# Patient Record
Sex: Female | Born: 1950 | Race: White | Hispanic: No | Marital: Married | Smoking: Former smoker
Health system: Southern US, Community
[De-identification: ages and names within clinical notes are randomized; demographics above are authoritative.]

## PROBLEM LIST (undated history)

## (undated) DIAGNOSIS — M1990 Unspecified osteoarthritis, unspecified site: Secondary | ICD-10-CM

## (undated) DIAGNOSIS — I714 Abdominal aortic aneurysm, without rupture, unspecified: Secondary | ICD-10-CM

## (undated) DIAGNOSIS — J449 Chronic obstructive pulmonary disease, unspecified: Secondary | ICD-10-CM

## (undated) DIAGNOSIS — Z720 Tobacco use: Secondary | ICD-10-CM

## (undated) DIAGNOSIS — E039 Hypothyroidism, unspecified: Secondary | ICD-10-CM

## (undated) DIAGNOSIS — E785 Hyperlipidemia, unspecified: Secondary | ICD-10-CM

## (undated) DIAGNOSIS — J439 Emphysema, unspecified: Secondary | ICD-10-CM

## (undated) DIAGNOSIS — N946 Dysmenorrhea, unspecified: Secondary | ICD-10-CM

## (undated) DIAGNOSIS — E079 Disorder of thyroid, unspecified: Secondary | ICD-10-CM

## (undated) DIAGNOSIS — Z5189 Encounter for other specified aftercare: Secondary | ICD-10-CM

## (undated) DIAGNOSIS — R42 Dizziness and giddiness: Secondary | ICD-10-CM

## (undated) DIAGNOSIS — K219 Gastro-esophageal reflux disease without esophagitis: Secondary | ICD-10-CM

## (undated) DIAGNOSIS — R0600 Dyspnea, unspecified: Secondary | ICD-10-CM

## (undated) HISTORY — PX: JOINT REPLACEMENT: SHX530

## (undated) HISTORY — DX: Hyperlipidemia, unspecified: E78.5

## (undated) HISTORY — PX: TUBAL LIGATION: SHX77

## (undated) HISTORY — DX: Abdominal aortic aneurysm, without rupture, unspecified: I71.40

## (undated) HISTORY — DX: Emphysema, unspecified: J43.9

## (undated) HISTORY — DX: Dizziness and giddiness: R42

## (undated) HISTORY — DX: Tobacco use: Z72.0

## (undated) HISTORY — DX: Chronic obstructive pulmonary disease, unspecified: J44.9

## (undated) HISTORY — DX: Disorder of thyroid, unspecified: E07.9

## (undated) HISTORY — DX: Dysmenorrhea, unspecified: N94.6

## (undated) HISTORY — DX: Encounter for other specified aftercare: Z51.89

## (undated) HISTORY — DX: Abdominal aortic aneurysm, without rupture: I71.4

---

## 2000-10-30 ENCOUNTER — Other Ambulatory Visit: Admission: RE | Admit: 2000-10-30 | Discharge: 2000-10-30 | Payer: Self-pay | Admitting: Family Medicine

## 2002-02-11 ENCOUNTER — Other Ambulatory Visit: Admission: RE | Admit: 2002-02-11 | Discharge: 2002-02-11 | Payer: Self-pay | Admitting: Family Medicine

## 2002-02-26 ENCOUNTER — Encounter: Admission: RE | Admit: 2002-02-26 | Discharge: 2002-02-26 | Payer: Self-pay | Admitting: Family Medicine

## 2002-02-26 ENCOUNTER — Encounter: Payer: Self-pay | Admitting: Family Medicine

## 2002-03-01 ENCOUNTER — Encounter: Payer: Self-pay | Admitting: Family Medicine

## 2002-03-01 ENCOUNTER — Ambulatory Visit (HOSPITAL_COMMUNITY): Admission: RE | Admit: 2002-03-01 | Discharge: 2002-03-01 | Payer: Self-pay | Admitting: Family Medicine

## 2002-03-16 ENCOUNTER — Ambulatory Visit (HOSPITAL_COMMUNITY): Admission: RE | Admit: 2002-03-16 | Discharge: 2002-03-16 | Payer: Self-pay | Admitting: Family Medicine

## 2002-03-16 ENCOUNTER — Encounter: Payer: Self-pay | Admitting: Family Medicine

## 2004-06-11 ENCOUNTER — Other Ambulatory Visit: Admission: RE | Admit: 2004-06-11 | Discharge: 2004-06-11 | Payer: Self-pay | Admitting: Family Medicine

## 2005-05-21 ENCOUNTER — Ambulatory Visit: Payer: Self-pay | Admitting: Family Medicine

## 2005-07-08 ENCOUNTER — Ambulatory Visit: Payer: Self-pay | Admitting: Family Medicine

## 2005-07-15 ENCOUNTER — Ambulatory Visit: Payer: Self-pay | Admitting: Family Medicine

## 2005-07-15 ENCOUNTER — Encounter: Payer: Self-pay | Admitting: Family Medicine

## 2005-07-15 ENCOUNTER — Other Ambulatory Visit: Admission: RE | Admit: 2005-07-15 | Discharge: 2005-07-15 | Payer: Self-pay | Admitting: Family Medicine

## 2005-07-24 ENCOUNTER — Ambulatory Visit: Payer: Self-pay | Admitting: Gastroenterology

## 2005-08-05 ENCOUNTER — Ambulatory Visit: Payer: Self-pay | Admitting: Family Medicine

## 2005-08-07 ENCOUNTER — Ambulatory Visit: Payer: Self-pay | Admitting: Gastroenterology

## 2005-08-07 ENCOUNTER — Encounter (INDEPENDENT_AMBULATORY_CARE_PROVIDER_SITE_OTHER): Payer: Self-pay | Admitting: Specialist

## 2005-09-20 ENCOUNTER — Ambulatory Visit: Payer: Self-pay | Admitting: Family Medicine

## 2005-12-05 ENCOUNTER — Ambulatory Visit: Payer: Self-pay | Admitting: Internal Medicine

## 2006-05-20 ENCOUNTER — Ambulatory Visit: Payer: Self-pay | Admitting: Family Medicine

## 2006-05-20 ENCOUNTER — Encounter: Admission: RE | Admit: 2006-05-20 | Discharge: 2006-05-20 | Payer: Self-pay | Admitting: Family Medicine

## 2006-07-15 ENCOUNTER — Ambulatory Visit: Payer: Self-pay | Admitting: Family Medicine

## 2006-07-15 LAB — CONVERTED CEMR LAB
ALT: 16 units/L (ref 0–40)
AST: 13 units/L (ref 0–37)
Albumin: 3.9 g/dL (ref 3.5–5.2)
Alkaline Phosphatase: 68 units/L (ref 39–117)
BUN: 13 mg/dL (ref 6–23)
Basophils Absolute: 0 10*3/uL (ref 0.0–0.1)
Basophils Relative: 0.6 % (ref 0.0–1.0)
CO2: 31 meq/L (ref 19–32)
Calcium: 9.5 mg/dL (ref 8.4–10.5)
Chloride: 108 meq/L (ref 96–112)
Chol/HDL Ratio, serum: 4.2
Cholesterol: 185 mg/dL (ref 0–200)
Creatinine, Ser: 0.8 mg/dL (ref 0.4–1.2)
Eosinophil percent: 3.6 % (ref 0.0–5.0)
GFR calc non Af Amer: 79 mL/min
Glomerular Filtration Rate, Af Am: 95 mL/min/{1.73_m2}
Glucose, Bld: 87 mg/dL (ref 70–99)
HCT: 46.6 % — ABNORMAL HIGH (ref 36.0–46.0)
HDL: 43.6 mg/dL (ref >39.0)
Hemoglobin: 15.7 g/dL — ABNORMAL HIGH (ref 12.0–15.0)
LDL Cholesterol: 118 mg/dL — ABNORMAL HIGH (ref 0–99)
Lymphocytes Relative: 35.4 % (ref 12.0–46.0)
MCHC: 33.8 g/dL (ref 30.0–36.0)
MCV: 95.2 fL (ref 78.0–100.0)
Monocytes Absolute: 0.4 10*3/uL (ref 0.2–0.7)
Monocytes Relative: 5.3 % (ref 3.0–11.0)
Neutro Abs: 4.2 10*3/uL (ref 1.4–7.7)
Neutrophils Relative %: 55.1 % (ref 43.0–77.0)
Platelets: 241 10*3/uL (ref 150–400)
Potassium: 4.3 meq/L (ref 3.5–5.1)
RBC: 4.89 M/uL (ref 3.87–5.11)
RDW: 12.7 % (ref 11.5–14.6)
Sodium: 145 meq/L (ref 135–145)
TSH: 1.05 microintl units/mL (ref 0.35–5.50)
Total Bilirubin: 0.7 mg/dL (ref 0.3–1.2)
Total Protein: 6.9 g/dL (ref 6.0–8.3)
Triglyceride fasting, serum: 118 mg/dL (ref 0–149)
VLDL: 24 mg/dL (ref 0–40)
WBC: 7.6 10*3/uL (ref 4.5–10.5)

## 2006-07-29 ENCOUNTER — Ambulatory Visit: Payer: Self-pay | Admitting: Family Medicine

## 2006-07-29 ENCOUNTER — Encounter: Payer: Self-pay | Admitting: Family Medicine

## 2006-07-29 ENCOUNTER — Other Ambulatory Visit: Admission: RE | Admit: 2006-07-29 | Discharge: 2006-07-29 | Payer: Self-pay | Admitting: Family Medicine

## 2007-03-16 ENCOUNTER — Ambulatory Visit: Payer: Self-pay | Admitting: Family Medicine

## 2007-04-01 DIAGNOSIS — N6019 Diffuse cystic mastopathy of unspecified breast: Secondary | ICD-10-CM

## 2007-04-29 ENCOUNTER — Encounter: Payer: Self-pay | Admitting: Family Medicine

## 2007-07-13 ENCOUNTER — Ambulatory Visit: Payer: Self-pay | Admitting: Family Medicine

## 2007-07-13 LAB — CONVERTED CEMR LAB
ALT: 16 units/L (ref 0–35)
AST: 17 units/L (ref 0–37)
Albumin: 4.1 g/dL (ref 3.5–5.2)
Alkaline Phosphatase: 78 units/L (ref 39–117)
BUN: 14 mg/dL (ref 6–23)
Basophils Absolute: 0 10*3/uL (ref 0.0–0.1)
Basophils Relative: 0.4 % (ref 0.0–1.0)
Bilirubin Urine: NEGATIVE
Bilirubin, Direct: 0.1 mg/dL (ref 0.0–0.3)
CO2: 27 meq/L (ref 19–32)
Calcium: 10 mg/dL (ref 8.4–10.5)
Chloride: 107 meq/L (ref 96–112)
Cholesterol: 223 mg/dL (ref 0–200)
Creatinine, Ser: 0.9 mg/dL (ref 0.4–1.2)
Direct LDL: 148.6 mg/dL
Eosinophils Absolute: 0.2 10*3/uL (ref 0.0–0.6)
Eosinophils Relative: 2.8 % (ref 0.0–5.0)
GFR calc Af Amer: 83 mL/min
GFR calc non Af Amer: 68 mL/min
Glucose, Bld: 96 mg/dL (ref 70–99)
Glucose, Urine, Semiquant: NEGATIVE
HCT: 47.8 % — ABNORMAL HIGH (ref 36.0–46.0)
HDL: 45 mg/dL (ref >39.0)
Hemoglobin: 16.9 g/dL — ABNORMAL HIGH (ref 12.0–15.0)
Ketones, urine, test strip: NEGATIVE
Lymphocytes Relative: 33.2 % (ref 12.0–46.0)
MCHC: 35.3 g/dL (ref 30.0–36.0)
MCV: 93.7 fL (ref 78.0–100.0)
Monocytes Absolute: 0.5 10*3/uL (ref 0.2–0.7)
Monocytes Relative: 6.3 % (ref 3.0–11.0)
Neutro Abs: 5 10*3/uL (ref 1.4–7.7)
Neutrophils Relative %: 57.3 % (ref 43.0–77.0)
Nitrite: POSITIVE
Platelets: 233 10*3/uL (ref 150–400)
Potassium: 5.8 meq/L — ABNORMAL HIGH (ref 3.5–5.1)
Protein, U semiquant: NEGATIVE
RBC: 5.11 M/uL (ref 3.87–5.11)
RDW: 12.6 % (ref 11.5–14.6)
Sodium: 144 meq/L (ref 135–145)
Specific Gravity, Urine: 1.025
TSH: 0.9 microintl units/mL (ref 0.35–5.50)
Total Bilirubin: 0.6 mg/dL (ref 0.3–1.2)
Total CHOL/HDL Ratio: 5
Total Protein: 7.1 g/dL (ref 6.0–8.3)
Triglycerides: 180 mg/dL — ABNORMAL HIGH (ref 0–149)
Urobilinogen, UA: NEGATIVE
VLDL: 36 mg/dL (ref 0–40)
WBC Urine, dipstick: NEGATIVE
WBC: 8.5 10*3/uL (ref 4.5–10.5)
pH: 5

## 2007-07-19 DIAGNOSIS — N61 Mastitis without abscess: Secondary | ICD-10-CM | POA: Insufficient documentation

## 2007-07-20 ENCOUNTER — Encounter: Payer: Self-pay | Admitting: Family Medicine

## 2007-07-20 ENCOUNTER — Other Ambulatory Visit: Admission: RE | Admit: 2007-07-20 | Discharge: 2007-07-20 | Payer: Self-pay | Admitting: Family Medicine

## 2007-07-20 ENCOUNTER — Ambulatory Visit: Payer: Self-pay | Admitting: Family Medicine

## 2007-07-20 DIAGNOSIS — N951 Menopausal and female climacteric states: Secondary | ICD-10-CM | POA: Insufficient documentation

## 2007-07-20 DIAGNOSIS — E018 Other iodine-deficiency related thyroid disorders and allied conditions: Secondary | ICD-10-CM

## 2007-07-20 DIAGNOSIS — H8110 Benign paroxysmal vertigo, unspecified ear: Secondary | ICD-10-CM | POA: Insufficient documentation

## 2007-07-20 DIAGNOSIS — Z8669 Personal history of other diseases of the nervous system and sense organs: Secondary | ICD-10-CM

## 2007-07-20 DIAGNOSIS — F17200 Nicotine dependence, unspecified, uncomplicated: Secondary | ICD-10-CM

## 2007-07-21 ENCOUNTER — Encounter: Payer: Self-pay | Admitting: Family Medicine

## 2007-11-26 ENCOUNTER — Ambulatory Visit: Payer: Self-pay | Admitting: Family Medicine

## 2007-11-26 DIAGNOSIS — M549 Dorsalgia, unspecified: Secondary | ICD-10-CM

## 2008-06-14 ENCOUNTER — Encounter: Payer: Self-pay | Admitting: Family Medicine

## 2008-06-30 ENCOUNTER — Telehealth: Payer: Self-pay | Admitting: Family Medicine

## 2008-07-11 ENCOUNTER — Telehealth: Payer: Self-pay | Admitting: Family Medicine

## 2008-07-13 ENCOUNTER — Ambulatory Visit: Payer: Self-pay | Admitting: Family Medicine

## 2008-07-13 LAB — CONVERTED CEMR LAB
AST: 16 units/L (ref 0–37)
Alkaline Phosphatase: 78 units/L (ref 39–117)
Bilirubin Urine: NEGATIVE
Bilirubin, Direct: 0.1 mg/dL (ref 0.0–0.3)
CO2: 29 meq/L (ref 19–32)
Chloride: 109 meq/L (ref 96–112)
GFR calc Af Amer: 94 mL/min
Glucose, Bld: 88 mg/dL (ref 70–99)
Glucose, Urine, Semiquant: NEGATIVE
HDL: 43.3 mg/dL (ref >39.0)
LDL Cholesterol: 101 mg/dL — ABNORMAL HIGH (ref 0–99)
Lymphocytes Relative: 39 % (ref 12.0–46.0)
Monocytes Absolute: 0.4 10*3/uL (ref 0.1–1.0)
Monocytes Relative: 5 % (ref 3.0–12.0)
Neutrophils Relative %: 51.7 % (ref 43.0–77.0)
Nitrite: POSITIVE
Platelets: 210 10*3/uL (ref 150–400)
Potassium: 4.8 meq/L (ref 3.5–5.1)
Protein, U semiquant: NEGATIVE
RDW: 12.5 % (ref 11.5–14.6)
Sodium: 144 meq/L (ref 135–145)
Specific Gravity, Urine: 1.025
Total CHOL/HDL Ratio: 4
Total Protein: 6.7 g/dL (ref 6.0–8.3)
Triglycerides: 144 mg/dL (ref 0–149)
Urobilinogen, UA: 0.2
WBC Urine, dipstick: NEGATIVE
WBC: 7.9 10*3/uL (ref 4.5–10.5)
pH: 5

## 2008-08-01 ENCOUNTER — Encounter: Payer: Self-pay | Admitting: Family Medicine

## 2008-08-01 ENCOUNTER — Ambulatory Visit: Payer: Self-pay | Admitting: Family Medicine

## 2008-08-01 ENCOUNTER — Other Ambulatory Visit: Admission: RE | Admit: 2008-08-01 | Discharge: 2008-08-01 | Payer: Self-pay | Admitting: Family Medicine

## 2008-08-01 DIAGNOSIS — J449 Chronic obstructive pulmonary disease, unspecified: Secondary | ICD-10-CM

## 2008-08-12 ENCOUNTER — Ambulatory Visit: Payer: Self-pay | Admitting: Internal Medicine

## 2008-08-15 ENCOUNTER — Telehealth: Payer: Self-pay | Admitting: Family Medicine

## 2008-09-12 ENCOUNTER — Ambulatory Visit: Payer: Self-pay | Admitting: Family Medicine

## 2008-10-24 ENCOUNTER — Telehealth: Payer: Self-pay | Admitting: Family Medicine

## 2008-12-29 ENCOUNTER — Telehealth: Payer: Self-pay | Admitting: Family Medicine

## 2009-03-23 ENCOUNTER — Ambulatory Visit: Payer: Self-pay | Admitting: Family Medicine

## 2009-07-25 ENCOUNTER — Telehealth: Payer: Self-pay | Admitting: Family Medicine

## 2009-07-28 ENCOUNTER — Ambulatory Visit: Payer: Self-pay | Admitting: Family Medicine

## 2009-07-28 LAB — CONVERTED CEMR LAB
ALT: 15 units/L (ref 0–35)
Alkaline Phosphatase: 65 units/L (ref 39–117)
Basophils Relative: 0.8 % (ref 0.0–3.0)
Bilirubin Urine: NEGATIVE
Bilirubin, Direct: 0.1 mg/dL (ref 0.0–0.3)
Calcium: 9.5 mg/dL (ref 8.4–10.5)
Chloride: 109 meq/L (ref 96–112)
Cholesterol: 203 mg/dL — ABNORMAL HIGH (ref 0–200)
Creatinine, Ser: 0.8 mg/dL (ref 0.4–1.2)
Eosinophils Relative: 5 % (ref 0.0–5.0)
Lymphocytes Relative: 38.6 % (ref 12.0–46.0)
Neutrophils Relative %: 50.5 % (ref 43.0–77.0)
Protein, U semiquant: NEGATIVE
RBC: 4.54 M/uL (ref 3.87–5.11)
Total CHOL/HDL Ratio: 4
Total Protein: 6.7 g/dL (ref 6.0–8.3)
Triglycerides: 206 mg/dL — ABNORMAL HIGH (ref 0.0–149.0)
Urobilinogen, UA: 0.2
VLDL: 41.2 mg/dL — ABNORMAL HIGH (ref 0.0–40.0)
WBC: 7.7 10*3/uL (ref 4.5–10.5)

## 2009-08-07 ENCOUNTER — Ambulatory Visit: Payer: Self-pay | Admitting: Family Medicine

## 2009-08-07 ENCOUNTER — Other Ambulatory Visit: Admission: RE | Admit: 2009-08-07 | Discharge: 2009-08-07 | Payer: Self-pay | Admitting: Family Medicine

## 2009-08-18 ENCOUNTER — Encounter: Payer: Self-pay | Admitting: *Deleted

## 2009-10-05 ENCOUNTER — Telehealth: Payer: Self-pay | Admitting: Family Medicine

## 2009-12-06 ENCOUNTER — Telehealth: Payer: Self-pay | Admitting: Family Medicine

## 2010-02-15 ENCOUNTER — Ambulatory Visit: Payer: Self-pay | Admitting: Family Medicine

## 2010-02-15 DIAGNOSIS — M25539 Pain in unspecified wrist: Secondary | ICD-10-CM | POA: Insufficient documentation

## 2010-05-10 ENCOUNTER — Telehealth: Payer: Self-pay | Admitting: Family Medicine

## 2010-05-11 ENCOUNTER — Telehealth: Payer: Self-pay | Admitting: Family Medicine

## 2010-07-04 ENCOUNTER — Telehealth: Payer: Self-pay | Admitting: Family Medicine

## 2010-07-17 ENCOUNTER — Ambulatory Visit: Payer: Self-pay | Admitting: Family Medicine

## 2010-07-17 ENCOUNTER — Telehealth: Payer: Self-pay | Admitting: Family Medicine

## 2010-07-17 DIAGNOSIS — M25519 Pain in unspecified shoulder: Secondary | ICD-10-CM | POA: Insufficient documentation

## 2010-08-10 ENCOUNTER — Ambulatory Visit: Payer: Self-pay | Admitting: Family Medicine

## 2010-08-10 LAB — CONVERTED CEMR LAB
AST: 18 units/L (ref 0–37)
Alkaline Phosphatase: 75 units/L (ref 39–117)
Basophils Absolute: 0.1 10*3/uL (ref 0.0–0.1)
Bilirubin, Direct: 0.1 mg/dL (ref 0.0–0.3)
Calcium: 9.3 mg/dL (ref 8.4–10.5)
Creatinine, Ser: 0.8 mg/dL (ref 0.4–1.2)
GFR calc non Af Amer: 79.58 mL/min (ref >60)
Glucose, Bld: 92 mg/dL (ref 70–99)
HDL: 46 mg/dL (ref >39.00)
Hemoglobin: 15.5 g/dL — ABNORMAL HIGH (ref 12.0–15.0)
Lymphocytes Relative: 45.5 % (ref 12.0–46.0)
Monocytes Relative: 6.1 % (ref 3.0–12.0)
Neutro Abs: 3 10*3/uL (ref 1.4–7.7)
Neutrophils Relative %: 42.9 % — ABNORMAL LOW (ref 43.0–77.0)
Nitrite: NEGATIVE
Platelets: 199 10*3/uL (ref 150.0–400.0)
Protein, U semiquant: NEGATIVE
RDW: 13.5 % (ref 11.5–14.6)
Sodium: 143 meq/L (ref 135–145)
Total Bilirubin: 0.9 mg/dL (ref 0.3–1.2)
VLDL: 29 mg/dL (ref 0.0–40.0)
WBC Urine, dipstick: NEGATIVE
pH: 5

## 2010-08-17 ENCOUNTER — Ambulatory Visit: Payer: Self-pay | Admitting: Family Medicine

## 2010-09-28 ENCOUNTER — Other Ambulatory Visit
Admission: RE | Admit: 2010-09-28 | Discharge: 2010-09-28 | Payer: Self-pay | Source: Home / Self Care | Admitting: Family Medicine

## 2010-09-28 ENCOUNTER — Other Ambulatory Visit: Payer: Self-pay | Admitting: Family Medicine

## 2010-09-28 ENCOUNTER — Encounter: Payer: Self-pay | Admitting: Family Medicine

## 2010-10-11 NOTE — Progress Notes (Signed)
Summary: hydrocodone refill  Phone Note From Pharmacy   Summary of Call: patient is requesting a refill of hydrocodone.  last office visit was 6/11 Initial call taken by: Kern Reap CMA Duncan Dull),  May 10, 2010 8:48 AM  Follow-up for Phone Call        please call she was given 60 of each and a refill back in June??????????? Follow-up by: Roderick Pee MD,  May 10, 2010 11:14 AM  Additional Follow-up for Phone Call Additional follow up Details #1::        note sent to pharmacy Additional Follow-up by: Kern Reap CMA Duncan Dull),  May 10, 2010 3:17 PM

## 2010-10-11 NOTE — Assessment & Plan Note (Signed)
Summary: CPX // RS----PT [facility] // RS   Vital Signs:  Patient profile:   60 year old female Menstrual status:  postmenopausal Height:      65 inches Weight:      146 pounds Temp:     98.0 degrees F oral BP sitting:   110 / 80  (left arm) Cuff size:   regular  Vitals Entered By: Kern Reap CMA Duncan Dull) (September 28, 2010 2:58 PM) CC: cpx Is Patient Diabetic? No Pain Assessment Patient in pain? no        CC:  cpx.  History of Present Illness: Valeria is a 60 year old, married female, smoker, who comes in today for physical examination  Her main problem has always been the tobacco abuse.  She did purchase the chantix, but has not taken the medication.  She gets routine eye care, dental care, BSE monthly, annual mammography, colonoscopy, normal tetanus booster 2003, seasonal flu 2011, Pneumovax 2009.  She also takes Synthroid 100 micrograms daily for hypothyroidism.  TSH is normal.  Will continue that dose.  She also takes Zocor 60 mg nightly for hyperlipidemia, along with aspirin tablet.  Allergies (verified): No Known Drug Allergies  Past History:  Past medical, surgical, family and social histories (including risk factors) reviewed, and no changes noted (except as noted below).  Past Medical History: Reviewed history from 08/01/2008 and no changes required. status post childbirth x 3 hyperlipidemia PMS tobacco abuse persistent vertigo Hypothyroidism COPD  Past Surgical History: Reviewed history from 04/01/2007 and no changes required. CB x 3 Colonoscopy-08/07/2005  Family History: Reviewed history from 04/01/2007 and no changes required. Family History Thyroid disease Family History Diabetes 1st degree relative Family History High cholesterol Family History Hypertension  Social History: Reviewed history from 08/01/2008 and no changes required. Current Smoker Alcohol use-no Retired Drug use-no  Review of Systems      See HPI  Physical Exam  General:   Well-developed,well-nourished,in no acute distress; alert,appropriate and cooperative throughout examination Head:  Normocephalic and atraumatic without obvious abnormalities. No apparent alopecia or balding. Eyes:  No corneal or conjunctival inflammation noted. EOMI. Perrla. Funduscopic exam benign, without hemorrhages, exudates or papilledema. Vision grossly normal. Ears:  External ear exam shows no significant lesions or deformities.  Otoscopic examination reveals clear canals, tympanic membranes are intact bilaterally without bulging, retraction, inflammation or discharge. Hearing is grossly normal bilaterally. Nose:  External nasal examination shows no deformity or inflammation. Nasal mucosa are pink and moist without lesions or exudates. Mouth:  Oral mucosa and oropharynx without lesions or exudates.  Teeth in good repair. Neck:  No deformities, masses, or tenderness noted. Chest Wall:  No deformities, masses, or tenderness noted. Breasts:  No mass, nodules, thickening, tenderness, bulging, retraction, inflamation, nipple discharge or skin changes noted.   Lungs:  symmetrical but decreased breath sounds consistent with some COPD Heart:  Normal rate and regular rhythm. S1 and S2 normal without gallop, murmur, click, rub or other extra sounds. Abdomen:  Bowel sounds positive,abdomen soft and non-tender without masses, organomegaly or hernias noted. Rectal:  No external abnormalities noted. Normal sphincter tone. No rectal masses or tenderness. Genitalia:  Pelvic Exam:        External: normal female genitalia without lesions or masses        Vagina: normal without lesions or masses        Cervix: normal without lesions or masses        Adnexa: normal bimanual exam without masses or fullness  Uterus: normal by palpation        Pap smear: performed Msk:  No deformity or scoliosis noted of thoracic or lumbar spine.   Pulses:  R and L carotid,radial,femoral,dorsalis pedis and posterior  tibial pulses are full and equal bilaterally Extremities:  No clubbing, cyanosis, edema, or deformity noted with normal full range of motion of all joints.   Neurologic:  No cranial nerve deficits noted. Station and gait are normal. Plantar reflexes are down-going bilaterally. DTRs are symmetrical throughout. Sensory, motor and coordinative functions appear intact. Skin:  Intact without suspicious lesions or rashes Cervical Nodes:  No lymphadenopathy noted Axillary Nodes:  No palpable lymphadenopathy Inguinal Nodes:  No significant adenopathy Psych:  Cognition and judgment appear intact. Alert and cooperative with normal attention span and concentration. No apparent delusions, illusions, hallucinations   Impression & Recommendations:  Problem # 1:  COPD (ICD-496) Assessment Unchanged  Orders: Prescription Created Electronically [phone]) EKG w/ Interpretation (93000) Spirometry w/Graph (94010)  Problem # 2:  HYPOTHYROIDISM (ICD-244.9) Assessment: Improved  Her updated medication list for this problem includes:    Synthroid 100 Mcg Tabs (Levothyroxine sodium) .Marland Kitchen... Take 1 tablet by mouth every morning  Orders: Prescription Created Electronically [phone])  Problem # 3:  TOBACCO ABUSE (ICD-305.1) Assessment: Unchanged  The following medications were removed from the medication list:    Chantix Starting Month Pak 0.5 Mg X 11 & 1 Mg X 42 Misc (Varenicline tartrate) ..... Uad Her updated medication list for this problem includes:    Chantix Continuing Month Pak 1 Mg Tabs (Varenicline tartrate) ..... Uad  Orders: Prescription Created Electronically [phone]) EKG w/ Interpretation (93000) Spirometry w/Graph (94010)  Problem # 4:  PHYSICAL EXAMINATION (ICD-V70.0) Assessment: Unchanged  Orders: Prescription Created Electronically [phone])  Complete Medication List: 1)  Synthroid 100 Mcg Tabs (Levothyroxine sodium) .... Take 1 tablet by mouth every morning 2)  Asa 325  3)  Vicodin  Es 7.5-750 Mg Tabs (Hydrocodone-acetaminophen) .... Take 1 tablet by mouth three times a day 4)  Flexeril 10 Mg Tabs (Cyclobenzaprine hcl) .... Take 1 tablet by mouth three times a day 5)  Chantix Continuing Month Pak 1 Mg Tabs (Varenicline tartrate) .... Uad 6)  Zocor 40 Mg Tabs (Simvastatin) .... Take one and half tab by mouth once daily  Patient Instructions: 1)  because of the way y   Lungs sound and your overall health and the abnormalities that we see on your breathing tests.  I, think it's time he you just have to stop smoking.  Start the chantix a half a tablet a day.  Begin tapering by two per week.  Follow-up in 4 weeks 2)  Schedule your mammogram. 3)  Take an Aspirin every day. Prescriptions: ZOCOR 40 MG TABS (SIMVASTATIN) take one and half tab by mouth once daily  #150 x 3   Entered and Authorized by:   Roderick Pee MD   Signed by:   Roderick Pee MD on 09/28/2010   Method used:   [facility]  [address] [phone]       [address]       [place], [place]  [address]       Ph: [phone]       Fax: [phone]   RxID:   [id number] SYNTHROID 100 MCG  TABS (LEVOTHYROXINE SODIUM) Take 1 tablet by mouth every morning  #100 Tablet x 3  Entered and Authorized by:   Roderick Pee MD   Signed by:   Roderick Pee MD on 09/28/2010   Method used:   [facility]        [facility]  [address] [phone]       [address]       [place], [place]  [address]       Ph: [phone]       Fax: [phone]   RxID:   [id number]    Orders Added: 1)  Prescription Created Electronically [G8553] 2)  Est. Patient 40-64 years [99396] 3)  EKG w/ Interpretation [93000] 4)  Spirometry w/Graph [64403]

## 2010-10-11 NOTE — Progress Notes (Signed)
Summary: refill  Phone Note Refill Request Message from:  Fax from Pharmacy on December 06, 2009 2:27 PM  Refills Requested: Medication #1:  VALIUM 5 MG TABS take one tab at bedtime Initial call taken by: Kern Reap CMA Duncan Dull),  December 06, 2009 2:27 PM    Prescriptions: VALIUM 5 MG TABS (DIAZEPAM) take one tab at bedtime  #30 x 3   Entered by:   Kern Reap CMA (AAMA)   Authorized by:   Roderick Pee MD   Signed by:   Kern Reap CMA (AAMA) on 12/06/2009   Method used:   Historical   RxID:   [id number]

## 2010-10-11 NOTE — Assessment & Plan Note (Signed)
Summary: shoulder pain/njr   Vital Signs:  Patient profile:   60 year old female Menstrual status:  postmenopausal Weight:      144 pounds Temp:     98.0 degrees F oral BP sitting:   140 / 80  (left arm) Cuff size:   regular  Vitals Entered By: Kern Reap CMA Duncan Dull) (July 17, 2010 2:52 PM) CC: shoulder pain   CC:  shoulder pain.  History of Present Illness: Stacy Garcia is a 60 year old female, smoker, who comes in today for evaluation of shoulder and back pain,  She's had a history of lumbar disk disease with off and on back pain for about 4 years.  At one time.  We sent her for physical therapy, which she states did not help.  She describes the pain now is constant, sometimes sharp, sometimes dull, it.  The left lumbar area that radiates down the posterior left thigh.  She denies any neurologic symptoms specifically, no numbness nor weakness.  She's also having some discomfort in her left shoulder for a couple weeks.  No history of trauma  Allergies: No Known Drug Allergies  Past History:  Past medical, surgical, family and social histories (including risk factors) reviewed for relevance to current acute and chronic problems.  Past Medical History: Reviewed history from 08/01/2008 and no changes required. status post childbirth x 3 hyperlipidemia PMS tobacco abuse persistent vertigo Hypothyroidism COPD  Past Surgical History: Reviewed history from 04/01/2007 and no changes required. CB x 3 Colonoscopy-08/07/2005  Family History: Reviewed history from 04/01/2007 and no changes required. Family History Thyroid disease Family History Diabetes 1st degree relative Family History High cholesterol Family History Hypertension  Social History: Reviewed history from 08/01/2008 and no changes required. Current Smoker Alcohol use-no Retired Drug use-no  Review of Systems      See HPI       Flu Vaccine Consent Questions     Do you have a history of severe allergic  reactions to this vaccine? no    Any prior history of allergic reactions to egg and/or gelatin? no    Do you have a sensitivity to the preservative Thimersol? no    Do you have a past history of Guillan-Barre Syndrome? no    Do you currently have an acute febrile illness? no    Have you ever had a severe reaction to latex? no    Vaccine information given and explained to patient? yes    Are you currently pregnant? no    Lot Number:[id number]   Exp Date:03/09/2011   Site Given  Left Deltoid IM   Physical Exam  General:  Well-developed,well-nourished,in no acute distress; alert,appropriate and cooperative throughout examination Msk:  No deformity or scoliosis noted of thoracic or lumbar spine.  full range of motion of both shoulders except for decreased AB duction, consistent with some rotator cuff injury. Pulses:  R and L carotid,radial,femoral,dorsalis pedis and posterior tibial pulses are full and equal bilaterally Extremities:  No clubbing, cyanosis, edema, or deformity noted with normal full range of motion of all joints.   Neurologic:  No cranial nerve deficits noted. Station and gait are normal. Plantar reflexes are down-going bilaterally. DTRs are symmetrical throughout. Sensory, motor and coordinative functions appear intact.   Impression & Recommendations:  Problem # 1:  SHOULDER PAIN, BILATERAL (ICD-719.41) Assessment New  Her updated medication list for this problem includes:    Vicodin Es 7.5-750 Mg Tabs (Hydrocodone-acetaminophen) .Marland Kitchen... Take 1 tablet by mouth three times a day  Flexeril 10 Mg Tabs (Cyclobenzaprine hcl) .Marland Kitchen... Take 1 tablet by mouth three times a day  Orders: Orthopedic Referral (Ortho) Tobacco use cessation intermediate 3-10 minutes (45409)  Problem # 2:  BACK PAIN (ICD-724.5) Assessment: Unchanged  Her updated medication list for this problem includes:    Vicodin Es 7.5-750 Mg Tabs (Hydrocodone-acetaminophen) .Marland Kitchen... Take 1 tablet by mouth three  times a day    Flexeril 10 Mg Tabs (Cyclobenzaprine hcl) .Marland Kitchen... Take 1 tablet by mouth three times a day  Orders: Orthopedic Referral (Ortho) Tobacco use cessation intermediate 3-10 minutes (81191)  Complete Medication List: 1)  Synthroid 100 Mcg Tabs (Levothyroxine sodium) .... Take 1 tablet by mouth every morning 2)  Asa 325  3)  Vicodin Es 7.5-750 Mg Tabs (Hydrocodone-acetaminophen) .... Take 1 tablet by mouth three times a day 4)  Flexeril 10 Mg Tabs (Cyclobenzaprine hcl) .... Take 1 tablet by mouth three times a day 5)  Zocor 80 Mg Tabs (Simvastatin) .Marland Kitchen.. 1 tab @ bedtime 6)  Chantix Starting Month Pak 0.5 Mg X 11 & 1 Mg X 42 Misc (Varenicline tartrate) .... Uad 7)  Chantix Continuing Month Pak 1 Mg Tabs (Varenicline tartrate) .... Uad  Other Orders: Admin 1st Vaccine (47829) Flu Vaccine 56yrs + [phone])  Patient Instructions: 1)  take Motrin, 600 mg twice daily with food. 2)  You may take a half of Flexeril and/or Vicodin at bedtime. 3)  We were referred to Dr. Norlene Campbell for evaluation of your back pain. Prescriptions: FLEXERIL 10 MG  TABS (CYCLOBENZAPRINE HCL) Take 1 tablet by mouth three times a day  #50 x 1   Entered and Authorized by:   Roderick Pee MD   Signed by:   Roderick Pee MD on 07/17/2010   Method used:   Print then Give to Patient   RxID:   [id number] VICODIN ES 7.5-750 MG  TABS (HYDROCODONE-ACETAMINOPHEN) Take 1 tablet by mouth three times a day  #50 x 1   Entered and Authorized by:   Roderick Pee MD   Signed by:   Roderick Pee MD on 07/17/2010   Method used:   Print then Give to Patient   RxID:   [id number]    Orders Added: 1)  Admin 1st Vaccine [90471] 2)  Flu Vaccine 33yrs + [40347] 3)  Orthopedic Referral [Ortho] 4)  Est. Patient Level IV [42595] 5)  Tobacco use cessation intermediate 3-10 minutes [99406]

## 2010-10-11 NOTE — Progress Notes (Signed)
Summary: refills  Phone Note From Pharmacy   [facility]  [address] [id number] Summary of Call: patient is requesting a refill of hydrocone three times a day  and diazepam at bedtime is this okay to fill? Initial call taken by: Kern Reap CMA Duncan Dull),  July 04, 2010 1:29 PM  Follow-up for Phone Call         chronic pain.................., then she needs an office visit for evaluation Follow-up by: Roderick Pee MD,  July 05, 2010 8:41 AM  Additional Follow-up for Phone Call Additional follow up Details #1::        left message on machine for patient  Additional Follow-up by: Kern Reap CMA Duncan Dull),  July 05, 2010 11:44 AM

## 2010-10-11 NOTE — Progress Notes (Signed)
Summary: Pt requesting rx refill  Phone Note Refill Request Call back at Home Phone [phone] Message from:  Patient on October 05, 2009 12:08 PM  Refills Requested: Medication #1:  VICODIN ES 7.5-750 MG  TABS Take 1 tablet by mouth three times a day  Method Requested: Electronic Initial call taken by: Trixie Dredge,  October 05, 2009 12:08 PM  Follow-up for Phone Call        Vicodin ES number 30 directions one t.i.d. p.r.n. severe pain, refills x 2 Follow-up by: Roderick Pee MD,  October 05, 2009 12:53 PM  Additional Follow-up for Phone Call Additional follow up Details #1::        Rx called to pharmacy Additional Follow-up by: Kern Reap CMA Duncan Dull),  October 05, 2009 4:05 PM

## 2010-10-11 NOTE — Assessment & Plan Note (Signed)
Summary: back and wrist pain//ccm   Vital Signs:  Patient profile:   60 year old female Menstrual status:  postmenopausal Height:      65 inches Weight:      148 pounds BMI:     24.72 Temp:     98.1 degrees F oral BP sitting:   112 / 78  (left arm)  Vitals Entered By: Kern Reap CMA Duncan Dull) (February 15, 2010 4:29 PM) CC: right wrist pain, lower back pain   CC:  right wrist pain and lower back pain.  History of Present Illness: Stacy Garcia is a 60 year old, married female, smoker, who comes in today for evaluation of 3 problems.  She's had recurrence of her left low back pain about two weeks ago.  She describes the pain as constant, sharp, a 7 on a scale of one to 10.  It does not radiate.  It is worse if she sits.  She has no bowel nor bladder dysfunction, and no neurologic symptoms.  She's had episodes like this off and on for the past 10 years.  With the chantix she quit smoking for two months and then restarted.  She is off her Prempro and is having a lot of hot flushes but does not want to go back on the hormone replacement therapies since she continues to smoke.  I concur with that.  The tendinitis in her right arm is unchanged.  She's tried Motrin, ice splinting noted, which has not  helped.  Allergies: No Known Drug Allergies  Past History:  Past medical, surgical, family and social histories (including risk factors) reviewed for relevance to current acute and chronic problems.  Past Medical History: Reviewed history from 08/01/2008 and no changes required. status post childbirth x 3 hyperlipidemia PMS tobacco abuse persistent vertigo Hypothyroidism COPD  Past Surgical History: Reviewed history from 04/01/2007 and no changes required. CB x 3 Colonoscopy-08/07/2005  Family History: Reviewed history from 04/01/2007 and no changes required. Family History Thyroid disease Family History Diabetes 1st degree relative Family History High cholesterol Family History  Hypertension  Social History: Reviewed history from 08/01/2008 and no changes required. Current Smoker Alcohol use-no Retired Drug use-no  Physical Exam  General:  Well-developed,well-nourished,in no acute distress; alert,appropriate and cooperative throughout examination Msk:  No deformity or scoliosis noted of thoracic or lumbar spine.  .......Marland Kitchentenderness right wrist at with flexion and extension Pulses:  R and L carotid,radial,femoral,dorsalis pedis and posterior tibial pulses are full and equal bilaterally Extremities:  No clubbing, cyanosis, edema, or deformity noted with normal full range of motion of all joints.   Neurologic:  No cranial nerve deficits noted. Station and gait are normal. Plantar reflexes are down-going bilaterally. DTRs are symmetrical throughout. Sensory, motor and coordinative functions appear intact.   Impression & Recommendations:  Problem # 1:  BACK PAIN (ICD-724.5) Assessment Deteriorated  Her updated medication list for this problem includes:    Vicodin Es 7.5-750 Mg Tabs (Hydrocodone-acetaminophen) .Marland Kitchen... Take 1 tablet by mouth three times a day    Flexeril 10 Mg Tabs (Cyclobenzaprine hcl) .Marland Kitchen... Take 1 tablet by mouth three times a day  Orders: Prescription Created Electronically [phone]) Tobacco use cessation intermediate 3-10 minutes (47829)  Problem # 2:  TOBACCO ABUSE (ICD-305.1) Assessment: Deteriorated  Her updated medication list for this problem includes:    Chantix Starting Month Pak 0.5 Mg X 11 & 1 Mg X 42 Misc (Varenicline tartrate) ..... Uad    Chantix Continuing Month Pak 1 Mg Tabs (Varenicline tartrate) .Marland KitchenMarland KitchenMarland KitchenMarland Kitchen  Uad  Orders: Prescription Created Electronically [phone]) Tobacco use cessation intermediate 3-10 minutes (13086)  Problem # 3:  WRIST PAIN, RIGHT (VHQ-469.62) Assessment: Unchanged  Orders: Prescription Created Electronically [phone]) Tobacco use cessation intermediate 3-10 minutes (13244)  Complete Medication List: 1)   Synthroid 100 Mcg Tabs (Levothyroxine sodium) .... Take 1 tablet by mouth every morning 2)  Asa 325  3)  Vicodin Es 7.5-750 Mg Tabs (Hydrocodone-acetaminophen) .... Take 1 tablet by mouth three times a day 4)  Flexeril 10 Mg Tabs (Cyclobenzaprine hcl) .... Take 1 tablet by mouth three times a day 5)  Zocor 80 Mg Tabs (Simvastatin) .Marland Kitchen.. 1 tab @ bedtime 6)  Chantix Starting Month Pak 0.5 Mg X 11 & 1 Mg X 42 Misc (Varenicline tartrate) .... Uad 7)  Chantix Continuing Month Pak 1 Mg Tabs (Varenicline tartrate) .... Uad  Patient Instructions: 1)  restart the Motrin, 600 mg twice a day with food.  Also Flexeril and Vicodin, one half of each 3 times a day.  Stay at bed rest today, and Friday ............ Saturday, and Sunday, walk or lie down, and   If your pain does not improve by the first the week.  Call and we will get this set up for physical therapy 2)  restart the chantix 3)  Call Dr. Peter Whitfield at [facility] for orthopedic evaluation of the right wrist  Prescriptions: FLEXERIL 10 MG  TABS (CYCLOBENZAPRINE HCL) Take 1 tablet by mouth three times a day  #50 x 1   Entered and Authorized by:   Jeffrey A [name] MD   Signed by:   Jeffrey A [name] MD on 02/15/2010   Method used:   Print then Give to Patient   RxID:   [id number] VICODIN ES 7.5-750 MG  TABS (HYDROCODONE-ACETAMINOPHEN) Take 1 tablet by mouth three times a day  #50 x 1   Entered and Authorized by:   Jeffrey A [name] MD   Signed by:   Jeffrey A [name] MD on 02/15/2010   Method used:   Print then Give to Patient   RxID:   [id number] CHANTIX CONTINUING MONTH PAK 1 MG TABS (VARENICLINE TARTRATE) UAD  #1 x 2   Entered and Authorized by:   Jeffrey A [name] MD   Signed by:   Jeffrey A [name] MD on 02/15/2010   Method used:   [facility]        [facility]  [address] [id number]       [address]       [place], [place]  [address]       Ph: [phone]       Fax: [phone]   RxID:    [id number]

## 2010-10-11 NOTE — Progress Notes (Signed)
Summary: simvastatin 80mg   Phone Note From Pharmacy   [facility]  [address] [phone]* Summary of Call: patient currently is taking simvastatin 80mg  what would you like that changed to? Initial call taken by: Kern Reap CMA Duncan Dull),  July 17, 2010 4:59 PM  Follow-up for Phone Call        Zocor 40 mg directions 1 1/2 tablets daily, dispense 150  tabs, refills x 2 Follow-up by: Roderick Pee MD,  July 17, 2010 5:02 PM    New/Updated Medications: ZOCOR 40 MG TABS (SIMVASTATIN) take one and half tab by mouth once daily Prescriptions: ZOCOR 40 MG TABS (SIMVASTATIN) take one and half tab by mouth once daily  #150 x 2   Entered by:   Kern Reap CMA (AAMA)   Authorized by:   Roderick Pee MD   Signed by:   Kern Reap CMA (AAMA) on 07/18/2010   Method used:   [facility] to        [facility]  [address] [phone]* (retail)       [address]       [place], [place]  [address]       Ph: [phone]       Fax: [phone]   RxID:   [id number]

## 2010-10-11 NOTE — Progress Notes (Signed)
Summary: REFILL REQUEST  Phone Note Refill Request Message from:  Patient on May 11, 2010 2:18 PM  Refills Requested: Medication #1:  VICODIN ES 7.5-750 MG  TABS Take 1 tablet by mouth three times a day   Notes: [facility]    Initial call taken by: Debbra Riding,  May 11, 2010 2:19 PM  Follow-up for Phone Call        spoke with pharmacy Follow-up by: Kern Reap CMA Duncan Dull),  May 11, 2010 2:37 PM

## 2010-10-26 ENCOUNTER — Encounter: Payer: Self-pay | Admitting: Family Medicine

## 2010-10-26 ENCOUNTER — Ambulatory Visit (INDEPENDENT_AMBULATORY_CARE_PROVIDER_SITE_OTHER): Payer: 59 | Admitting: Family Medicine

## 2010-10-26 DIAGNOSIS — E018 Other iodine-deficiency related thyroid disorders and allied conditions: Secondary | ICD-10-CM

## 2010-10-26 DIAGNOSIS — Z0289 Encounter for other administrative examinations: Secondary | ICD-10-CM

## 2010-11-19 ENCOUNTER — Encounter: Payer: Self-pay | Admitting: Family Medicine

## 2010-11-19 ENCOUNTER — Ambulatory Visit (INDEPENDENT_AMBULATORY_CARE_PROVIDER_SITE_OTHER): Payer: 59 | Admitting: Family Medicine

## 2010-11-19 DIAGNOSIS — M25562 Pain in left knee: Secondary | ICD-10-CM | POA: Insufficient documentation

## 2010-11-19 DIAGNOSIS — F17200 Nicotine dependence, unspecified, uncomplicated: Secondary | ICD-10-CM

## 2010-11-19 DIAGNOSIS — M25569 Pain in unspecified knee: Secondary | ICD-10-CM

## 2010-11-19 DIAGNOSIS — Z8669 Personal history of other diseases of the nervous system and sense organs: Secondary | ICD-10-CM

## 2010-11-19 MED ORDER — HYDROCODONE-ACETAMINOPHEN 7.5-750 MG PO TABS: ORAL_TABLET | ORAL | Status: DC

## 2010-11-19 MED ORDER — DIAZEPAM 2 MG PO TABS: ORAL_TABLET | ORAL | Status: DC

## 2010-11-19 NOTE — Patient Instructions (Signed)
Motrin 600 mg twice daily with food, elevation, and ice 15 minutes 4 times daily, orthopedic evaluation with Dr. Cleophas Dunker, p.r.n.  Start the smoking cessation program with the chantix.  Vicodin one half to one tablet at bedtime as needed for severe knee pain.  2 mg of Valium nightly p.r.n. For vertigo

## 2010-11-19 NOTE — Progress Notes (Signed)
  Subjective:    Patient ID: Stacy Garcia, female    DOB: 08/09/1951, 60 y.o.   MRN: [id number]  Stacy Garcia Is a 60 year old female comes in today for evaluation of left knee pain, vertigo, tobacco abuse.  She's had a history of off-and-on left knee pain.  It typically gets better with conservative therapy and rest.  At this, time it's been sore for two weeks.  No history of trauma.  She's been a heavy smoker throughout the years.  She points to medial joint line as a source for pain.  No history of any ligamentous damage.  No effusion.  No locking.  She states she has vertigo, which he takes a 2-mg Valium tablets nightly p.r.n.  She also continues to smoke.  Her overall health and the joint problem.  Again, advised to start the smoking cessation program with the chantix    Review of Systems Negative except for above    Objective:   Physical Exam    Thin female, in no acute distress.  Examination of the left knee shows no effusion.  Ligaments are intact.  Tenderness medial joint line.    Assessment & Plan:  Pain left knee probable degenerative cartilage.  Plan elevation, ice, Motrin, 600 b.i.d. Or so consult with Dr. Cleophas Dunker, p.r.n.  Vertigo.  Plan Valium 2 mg q.h. As p.r.n.  Tobacco abuse start the chantix in the smoking cessation program

## 2011-04-10 NOTE — Progress Notes (Signed)
  Subjective:    Patient ID: Stacy Garcia, female    DOB: 16-Sep-1950, 60 y.o.   MRN: [id number]  HPI No show   Review of Systems     Objective:   Physical Exam        Assessment & Plan:

## 2011-04-25 ENCOUNTER — Other Ambulatory Visit: Payer: Self-pay | Admitting: *Deleted

## 2011-04-25 DIAGNOSIS — M25562 Pain in left knee: Secondary | ICD-10-CM

## 2011-04-25 MED ORDER — HYDROCODONE-ACETAMINOPHEN 7.5-750 MG PO TABS: ORAL_TABLET | ORAL | Status: DC

## 2011-05-24 ENCOUNTER — Other Ambulatory Visit: Payer: Self-pay | Admitting: *Deleted

## 2011-05-24 DIAGNOSIS — Z8669 Personal history of other diseases of the nervous system and sense organs: Secondary | ICD-10-CM

## 2011-05-24 MED ORDER — DIAZEPAM 2 MG PO TABS: ORAL_TABLET | ORAL | Status: DC

## 2011-10-20 ENCOUNTER — Other Ambulatory Visit: Payer: Self-pay | Admitting: Family Medicine

## 2011-12-04 ENCOUNTER — Other Ambulatory Visit: Payer: Self-pay | Admitting: *Deleted

## 2011-12-04 DIAGNOSIS — M25562 Pain in left knee: Secondary | ICD-10-CM

## 2011-12-04 MED ORDER — HYDROCODONE-ACETAMINOPHEN 7.5-750 MG PO TABS: ORAL_TABLET | ORAL | Status: DC

## 2011-12-17 ENCOUNTER — Telehealth: Payer: Self-pay | Admitting: Family Medicine

## 2011-12-17 MED ORDER — LEVOTHYROXINE SODIUM 100 MCG PO TABS: 100.0000 ug | ORAL_TABLET | Freq: Every day | ORAL | Status: DC

## 2011-12-17 MED ORDER — SIMVASTATIN 40 MG PO TABS: 40.0000 mg | ORAL_TABLET | Freq: Every day | ORAL | Status: DC

## 2011-12-17 NOTE — Telephone Encounter (Signed)
Pt requesting refill on levothyroxine (SYNTHROID, LEVOTHROID) 100 MCG tablet  And simvastatin (ZOCOR) 40 MG tablet    [facility]

## 2011-12-19 ENCOUNTER — Other Ambulatory Visit: Payer: 59

## 2011-12-26 ENCOUNTER — Encounter: Payer: 59 | Admitting: Family Medicine

## 2012-01-07 ENCOUNTER — Telehealth: Payer: Self-pay | Admitting: Family Medicine

## 2012-01-07 NOTE — Telephone Encounter (Signed)
Having a lot of stomach issues with no relief. Needs an appt today. Please assist.

## 2012-01-07 NOTE — Telephone Encounter (Signed)
Fleet Contras please call,,,,,,,,,,, see if you can determine the nature of the problem,,,,,,,,,, and if she has not seen GI we might want to consider that

## 2012-01-07 NOTE — Telephone Encounter (Signed)
Left message on machine returning patient's call 

## 2012-01-08 ENCOUNTER — Telehealth: Payer: Self-pay | Admitting: Family Medicine

## 2012-01-08 MED ORDER — HYDROCODONE-HOMATROPINE 5-1.5 MG/5ML PO SYRP: 5.0000 mL | ORAL_SOLUTION | Freq: Three times a day (TID) | ORAL | Status: AC | PRN

## 2012-01-08 NOTE — Telephone Encounter (Signed)
Left message on machine on machine

## 2012-01-08 NOTE — Telephone Encounter (Signed)
Addended by: Kern Reap B on: 01/08/2012 09:56 AM   Modules accepted: Orders

## 2012-01-08 NOTE — Telephone Encounter (Signed)
Clear liquid diet and Hydromet per Dr Tawanna Cooler.  Left message on machine and Rx called in

## 2012-01-08 NOTE — Telephone Encounter (Signed)
Pt called and said that she is still having abdominal cramps, diarrhea yesterday, no fever, nausea. Pt said that she has not seen a GI doctor in many years. Pt req call back from nurse. [facility] [address]

## 2012-02-06 ENCOUNTER — Telehealth: Payer: Self-pay | Admitting: Family Medicine

## 2012-02-06 DIAGNOSIS — F17200 Nicotine dependence, unspecified, uncomplicated: Secondary | ICD-10-CM

## 2012-02-06 DIAGNOSIS — E039 Hypothyroidism, unspecified: Secondary | ICD-10-CM

## 2012-02-06 DIAGNOSIS — Z0000 Encounter for general adult medical examination without abnormal findings: Secondary | ICD-10-CM

## 2012-02-06 DIAGNOSIS — J449 Chronic obstructive pulmonary disease, unspecified: Secondary | ICD-10-CM

## 2012-02-06 NOTE — Telephone Encounter (Signed)
Stacy Garcia, pt is going to [facility] lab tomorrow for CPX labs. Can you put the orders in please? Thanks.

## 2012-02-06 NOTE — Telephone Encounter (Signed)
Labs ordered.

## 2012-02-07 ENCOUNTER — Other Ambulatory Visit (INDEPENDENT_AMBULATORY_CARE_PROVIDER_SITE_OTHER): Payer: 59

## 2012-02-07 ENCOUNTER — Other Ambulatory Visit: Payer: 59

## 2012-02-07 DIAGNOSIS — J449 Chronic obstructive pulmonary disease, unspecified: Secondary | ICD-10-CM

## 2012-02-07 DIAGNOSIS — E039 Hypothyroidism, unspecified: Secondary | ICD-10-CM

## 2012-02-07 DIAGNOSIS — Z0000 Encounter for general adult medical examination without abnormal findings: Secondary | ICD-10-CM

## 2012-02-07 DIAGNOSIS — F17200 Nicotine dependence, unspecified, uncomplicated: Secondary | ICD-10-CM

## 2012-02-07 LAB — CBC WITH DIFFERENTIAL/PLATELET
Basophils Absolute: 0 10*3/uL (ref 0.0–0.1)
Basophils Relative: 0.5 % (ref 0.0–3.0)
Eosinophils Relative: 4.7 % (ref 0.0–5.0)
Hemoglobin: 16 g/dL — ABNORMAL HIGH (ref 12.0–15.0)
Lymphocytes Relative: 42.4 % (ref 12.0–46.0)
Monocytes Relative: 5.9 % (ref 3.0–12.0)
Neutro Abs: 3.5 10*3/uL (ref 1.4–7.7)
RBC: 5.06 Mil/uL (ref 3.87–5.11)
WBC: 7.5 10*3/uL (ref 4.5–10.5)

## 2012-02-07 LAB — HEPATIC FUNCTION PANEL
Albumin: 4 g/dL (ref 3.5–5.2)
Total Bilirubin: 0.7 mg/dL (ref 0.3–1.2)

## 2012-02-07 LAB — URINALYSIS, ROUTINE W REFLEX MICROSCOPIC
Ketones, ur: NEGATIVE
Leukocytes, UA: NEGATIVE
Specific Gravity, Urine: 1.025 (ref 1.000–1.030)
Total Protein, Urine: NEGATIVE
Urine Glucose: NEGATIVE
pH: 6 (ref 5.0–8.0)

## 2012-02-07 LAB — TSH: TSH: 0.24 u[IU]/mL — ABNORMAL LOW (ref 0.35–5.50)

## 2012-02-07 LAB — BASIC METABOLIC PANEL
BUN: 15 mg/dL (ref 6–23)
Calcium: 9.3 mg/dL (ref 8.4–10.5)
Creatinine, Ser: 0.8 mg/dL (ref 0.4–1.2)
GFR: 80.39 mL/min (ref >60.00)

## 2012-02-07 LAB — LIPID PANEL
Cholesterol: 161 mg/dL (ref 0–200)
Triglycerides: 120 mg/dL (ref 0.0–149.0)

## 2012-02-15 ENCOUNTER — Other Ambulatory Visit: Payer: Self-pay | Admitting: Family Medicine

## 2012-02-20 ENCOUNTER — Encounter: Payer: Self-pay | Admitting: Family Medicine

## 2012-02-24 ENCOUNTER — Ambulatory Visit (INDEPENDENT_AMBULATORY_CARE_PROVIDER_SITE_OTHER): Payer: 59 | Admitting: Family Medicine

## 2012-02-24 ENCOUNTER — Encounter: Payer: Self-pay | Admitting: Family Medicine

## 2012-02-24 VITALS — BP 110/68 | Temp 97.9°F | Ht 65.0 in | Wt 142.0 lb

## 2012-02-24 DIAGNOSIS — Z0000 Encounter for general adult medical examination without abnormal findings: Secondary | ICD-10-CM

## 2012-02-24 DIAGNOSIS — J449 Chronic obstructive pulmonary disease, unspecified: Secondary | ICD-10-CM

## 2012-02-24 DIAGNOSIS — Z8669 Personal history of other diseases of the nervous system and sense organs: Secondary | ICD-10-CM

## 2012-02-24 DIAGNOSIS — Z23 Encounter for immunization: Secondary | ICD-10-CM

## 2012-02-24 DIAGNOSIS — E018 Other iodine-deficiency related thyroid disorders and allied conditions: Secondary | ICD-10-CM

## 2012-02-24 DIAGNOSIS — E785 Hyperlipidemia, unspecified: Secondary | ICD-10-CM

## 2012-02-24 DIAGNOSIS — F17200 Nicotine dependence, unspecified, uncomplicated: Secondary | ICD-10-CM

## 2012-02-24 DIAGNOSIS — N760 Acute vaginitis: Secondary | ICD-10-CM

## 2012-02-24 MED ORDER — DIAZEPAM 2 MG PO TABS: ORAL_TABLET | ORAL | Status: DC

## 2012-02-24 MED ORDER — LEVOTHYROXINE SODIUM 100 MCG PO TABS: 100.0000 ug | ORAL_TABLET | Freq: Every day | ORAL | Status: DC

## 2012-02-24 MED ORDER — SIMVASTATIN 40 MG PO TABS: 40.0000 mg | ORAL_TABLET | Freq: Every day | ORAL | Status: DC

## 2012-02-24 MED ORDER — ESTRADIOL 0.1 MG/GM VA CREA: 2.0000 g | TOPICAL_CREAM | Freq: Every day | VAGINAL | Status: DC

## 2012-02-24 NOTE — Patient Instructions (Signed)
Use small amounts of the hormonal cream 3 times weekly for 3 months and then decrease to twice weekly  Restart the Chantix program,,,,,,,,,, one half tablet Monday Thursday for one month then one half tablet Monday Wednesday Friday for a second month and taper by one per week  Return in one year sooner if any problems

## 2012-02-24 NOTE — Progress Notes (Signed)
  Subjective:    Patient ID: Stacy Garcia, female    DOB: 01/15/1951, 61 y.o.   MRN: [id number]  HPI Stacy Garcia is a 61 year old female smoker 8 cigarettes a day for comes in today for general physical examination  She takes 2 mg of Valium each bedtime for chronic vertigo  She takes Synthroid 100 mcg daily for hypothyroidism  She takes simvastatin 40 mg daily along with an aspirin tablet for hyperlipidemia. We did restart her on the Chantix program however she quit because she said it was given her nightmares.  Tetanus 2003, Pneumovax 2009, routine eye care dental care her recent mammography in the fall and she does BSE monthly   Review of Systems  Constitutional: Negative.   HENT: Negative.   Eyes: Negative.   Respiratory: Negative.   Cardiovascular: Negative.   Gastrointestinal: Negative.   Genitourinary: Negative.   Musculoskeletal: Negative.   Neurological: Negative.   Hematological: Negative.   Psychiatric/Behavioral: Negative.        Objective:   Physical Exam  Constitutional: She appears well-developed and well-nourished.  HENT:  Head: Normocephalic and atraumatic.  Right Ear: External ear normal.  Left Ear: External ear normal.  Nose: Nose normal.  Mouth/Throat: Oropharynx is clear and moist.  Eyes: EOM are normal. Pupils are equal, round, and reactive to light.  Neck: Normal range of motion. Neck supple. No thyromegaly present.  Cardiovascular: Normal rate, regular rhythm, normal heart sounds and intact distal pulses.  Exam reveals no gallop and no friction rub.   No murmur heard. Pulmonary/Chest: Effort normal and breath sounds normal.  Abdominal: Soft. Bowel sounds are normal. She exhibits no distension and no mass. There is no tenderness. There is no rebound.  Genitourinary: Vagina normal.       An inspection she has extreme vaginal dryness  Musculoskeletal: Normal range of motion.  Lymphadenopathy:    She has no cervical adenopathy.  Neurological: She is  alert. She has normal reflexes. No cranial nerve deficit. She exhibits normal muscle tone. Coordination normal.  Skin: Skin is warm and dry.  Psychiatric: She has a normal mood and affect. Her behavior is normal. Judgment and thought content normal.          Assessment & Plan:  Chronic vertigo continue Valium 2 mg each bedtime  Hypothyroidism continue Synthroid 100 mcg daily  Hyperlipidemia continue Zocor 40 and an aspirin tablet  Tobacco abuse restart Chantix one half tab Monday and Thursday increase slowly decrease his cigarette consumption slowly.  Postmenopausal vaginal dryness hormonal cream 3 times weekly because of severe vaginal dryness

## 2012-04-10 ENCOUNTER — Other Ambulatory Visit: Payer: Self-pay | Admitting: *Deleted

## 2012-04-10 DIAGNOSIS — M25562 Pain in left knee: Secondary | ICD-10-CM

## 2012-04-10 MED ORDER — HYDROCODONE-ACETAMINOPHEN 7.5-750 MG PO TABS: ORAL_TABLET | ORAL | Status: DC

## 2012-04-20 ENCOUNTER — Encounter: Payer: Self-pay | Admitting: Family Medicine

## 2012-04-20 ENCOUNTER — Encounter (HOSPITAL_COMMUNITY): Payer: Self-pay

## 2012-04-20 ENCOUNTER — Emergency Department (HOSPITAL_COMMUNITY): Payer: BC Managed Care – PPO

## 2012-04-20 ENCOUNTER — Telehealth: Payer: Self-pay | Admitting: Family Medicine

## 2012-04-20 ENCOUNTER — Emergency Department (HOSPITAL_COMMUNITY)
Admission: EM | Admit: 2012-04-20 | Discharge: 2012-04-20 | Disposition: A | Discharge status: Home / Self Care | Payer: BC Managed Care – PPO | Attending: Emergency Medicine | Admitting: Emergency Medicine

## 2012-04-20 ENCOUNTER — Ambulatory Visit (INDEPENDENT_AMBULATORY_CARE_PROVIDER_SITE_OTHER): Payer: BC Managed Care – PPO | Admitting: Family Medicine

## 2012-04-20 DIAGNOSIS — R51 Headache: Secondary | ICD-10-CM

## 2012-04-20 DIAGNOSIS — R42 Dizziness and giddiness: Secondary | ICD-10-CM | POA: Insufficient documentation

## 2012-04-20 LAB — CBC WITH DIFFERENTIAL/PLATELET
Basophils Absolute: 0 10*3/uL (ref 0.0–0.1)
Basophils Relative: 1 % (ref 0–1)
HCT: 45.3 % (ref 36.0–46.0)
Hemoglobin: 15.6 g/dL — ABNORMAL HIGH (ref 12.0–15.0)
Lymphocytes Relative: 50 % — ABNORMAL HIGH (ref 12–46)
MCHC: 34.4 g/dL (ref 30.0–36.0)
Monocytes Absolute: 0.3 10*3/uL (ref 0.1–1.0)
Monocytes Relative: 7 % (ref 3–12)
Neutro Abs: 1.6 10*3/uL — ABNORMAL LOW (ref 1.7–7.7)
Neutrophils Relative %: 42 % — ABNORMAL LOW (ref 43–77)
RDW: 12.9 % (ref 11.5–15.5)
WBC: 3.8 10*3/uL — ABNORMAL LOW (ref 4.0–10.5)

## 2012-04-20 LAB — BASIC METABOLIC PANEL
CO2: 24 mEq/L (ref 19–32)
Chloride: 100 mEq/L (ref 96–112)
GFR calc Af Amer: >90 mL/min (ref >90)
Potassium: 3.1 mEq/L — ABNORMAL LOW (ref 3.5–5.1)

## 2012-04-20 MED ORDER — METOCLOPRAMIDE HCL 5 MG/ML IJ SOLN
10.0000 mg | Freq: Once | INTRAMUSCULAR | Status: AC
  Administered 2012-04-20: 10 mg via INTRAVENOUS
  Filled 2012-04-20: qty 2

## 2012-04-20 MED ORDER — DIPHENHYDRAMINE HCL 50 MG/ML IJ SOLN
25.0000 mg | Freq: Once | INTRAMUSCULAR | Status: AC
  Administered 2012-04-20: 25 mg via INTRAVENOUS
  Filled 2012-04-20: qty 1

## 2012-04-20 MED ORDER — DEXAMETHASONE SODIUM PHOSPHATE 10 MG/ML IJ SOLN
10.0000 mg | Freq: Once | INTRAMUSCULAR | Status: AC
  Administered 2012-04-20: 10 mg via INTRAVENOUS
  Filled 2012-04-20: qty 1

## 2012-04-20 NOTE — ED Notes (Signed)
Pt complains of headache, that gets better with painmeds but sts the medicine makes her feel bad but she is willing now to taker her medicine.

## 2012-04-20 NOTE — Patient Instructions (Signed)
Go directly to the emergency room for evaluation

## 2012-04-20 NOTE — Telephone Encounter (Signed)
Granddaughter just called from the hospital. She wants to know if Dr. Tawanna Cooler called the hospital and told them Stacy Garcia was coming, because they are still sitting in the waiting room. Please call granddaughter Stacy Garcia) back on her cell. Thanks.

## 2012-04-20 NOTE — Progress Notes (Signed)
  Subjective:    Patient ID: Stacy Garcia, female    DOB: Feb 22, 1951, 61 y.o.   MRN: [id number]  HPI Stacy Garcia is a 61 year old female who comes in today accompanied by her daughter for evaluation of headache  She states that last Friday around 5 PM she developed a severe left-sided posterior headache. She states is the worst headache ever having a life. She's had a history of tension headaches in the past but no migraines. She went to an urgent care and was told she had a viral infection. Since then the headache has been constant and she has no viral symptoms no fever earache etc. She describes the pain as constant sharp a 20 a scale of 1-10. She sitting in a chair crying with pain. She denies any neurologic symptoms   Review of Systems General and neurologic review of systems otherwise negative    Objective:   Physical Exam Thin female crying with severe pain  No exam done she was sent straight to the emergency room     Assessment & Plan:  Severe left occipital headache referred directly to emerge from for evaluation

## 2012-04-21 NOTE — ED Provider Notes (Signed)
History     CSN: [id number]  Arrival date & time 04/20/12  1249   First MD Initiated Contact with Patient 04/20/12 1527      Chief Complaint  Patient presents with  . Headache    (Consider location/radiation/quality/duration/timing/severity/associated sxs/prior treatment) HPI History from patient. 61 year old female with past medical history of vertigo, COPD who presents with headache. She states this has been ongoing since Thursday. Pain is described as sharp and stabbing in nature and is located just behind her left ear. It was initially intermittent but has since been constant. Pain does not radiate; there is no radiation into her temple or into her face. She denies any nausea, vomiting, photophobia, phonophobia. She has chronic vertigo and states this has not significantly changed. She denies any numbness or weakness or tingling. She has never had a headache like this previously. She denies fever or chills and has not been coughing or congested. She has not had any ear pain. She denies hx trauma.  She was seen at urgent care over the weekend and told that she may have a virus. She was not prescribed any medication. She saw her PCP today who referred her here for possible imaging due to the severe pain. She has taken Percocet at home with relief of her sx.  Past Medical History  Diagnosis Date  . Hyperlipidemia   . Tobacco abuse   . Vertigo   . Thyroid disease   . COPD (chronic obstructive pulmonary disease)     No past surgical history on file.  Family History  Problem Relation Age of Onset  . Thyroid disease Other   . Diabetes Other   . Hyperlipidemia Other   . Hypertension Other     History  Substance Use Topics  . Smoking status: Current Everyday Smoker  . Smokeless tobacco: Not on file  . Alcohol Use: No    OB History    Grav Para Term Preterm Abortions TAB SAB Ect Mult Living                  Review of Systems  Constitutional: Negative for fever and  chills.  HENT: Negative for ear pain, congestion, sore throat, facial swelling, mouth sores, trouble swallowing, neck pain, neck stiffness, tinnitus and ear discharge.   Eyes: Negative for photophobia and visual disturbance.  Respiratory: Negative for cough, chest tightness and shortness of breath.   Cardiovascular: Negative for chest pain and palpitations.  Gastrointestinal: Negative for nausea, vomiting and abdominal pain.  Musculoskeletal: Negative for myalgias.  Skin: Negative for rash.  Neurological: Positive for dizziness (chronic vertigo) and headaches. Negative for syncope, speech difficulty, weakness and numbness.  All other systems reviewed and are negative.    Allergies  Review of patient's allergies indicates no known allergies.  Home Medications   Current Outpatient Rx  Name Route Sig Dispense Refill  . ASPIRIN 325 MG PO TABS Oral Take 325 mg by mouth daily.      Marland Kitchen DIAZEPAM 2 MG PO TABS  One p.o. Nightly, p.r.n. vertigo 100 tablet 3  . HYDROCODONE-ACETAMINOPHEN 7.5-750 MG PO TABS  One half to one tablet at bedtime as needed for severe left knee pain 30 tablet 5  . IBUPROFEN 200 MG PO TABS Oral Take 400 mg by mouth every 6 (six) hours as needed. For pain    . LEVOTHYROXINE SODIUM 100 MCG PO TABS Oral Take 1 tablet (100 mcg total) by mouth daily. 100 tablet 3  . ADULT MULTIVITAMIN W/MINERALS CH Oral  Take 1 tablet by mouth daily.    . OXYCODONE-ACETAMINOPHEN 5-325 MG PO TABS Oral Take 1 tablet by mouth every 4 (four) hours as needed. For pain    . SIMVASTATIN 40 MG PO TABS Oral Take 1 tablet (40 mg total) by mouth at bedtime. 100 tablet 3    Needs to be seen - last seen 11/2010    BP 123/80  Pulse 84  Temp 97.7 F (36.5 C) (Oral)  Resp 18  SpO2 94%  Physical Exam  Nursing note and vitals reviewed. Constitutional: She appears well-developed and well-nourished. No distress.  HENT:  Head: Normocephalic and atraumatic.         TMs nl b/l Sinuses nontender to palp    Eyes: EOM are normal. Pupils are equal, round, and reactive to light.  Neck: Normal range of motion.  Cardiovascular: Normal rate, regular rhythm and normal heart sounds.   Pulmonary/Chest: Effort normal and breath sounds normal. She exhibits no tenderness.  Abdominal: Soft. Bowel sounds are normal. There is no tenderness. There is no rebound and no guarding.  Musculoskeletal: Normal range of motion.  Neurological: She is alert.       Speech clear, pupils equal round reactive to light, extraocular movements intact   Normal peripheral visual fields Cranial nerves III through XII normal including no facial droop Follows commands, moves all extremities x4, normal strength to bilateral upper and lower extremities at all major muscle groups including grip Sensation normal to light touch Coordination intact, no limb ataxia, finger-nose-finger normal Rapid alternating movements normal No pronator drift Gait normal  Skin: Skin is warm and dry. She is not diaphoretic.  Psychiatric: She has a normal mood and affect.    ED Course  Procedures (including critical care time)  Labs Reviewed  CBC WITH DIFFERENTIAL - Abnormal; Notable for the following:    WBC 3.8 (*)     Hemoglobin 15.6 (*)     Platelets 126 (*)     Neutrophils Relative 42 (*)     Neutro Abs 1.6 (*)     Lymphocytes Relative 50 (*)     All other components within normal limits  BASIC METABOLIC PANEL - Abnormal; Notable for the following:    Potassium 3.1 (*)     Glucose, Bld 155 (*)     GFR calc non Af Amer 89 (*)     All other components within normal limits  LAB REPORT - SCANNED   Ct Head Wo Contrast  04/20/2012  *RADIOLOGY REPORT*  Clinical Data: Severe left sided headache.  CT HEAD WITHOUT CONTRAST  Technique:  Contiguous axial images were obtained from the base of the skull through the vertex without contrast.  Comparison: None.  Findings: Ventricle size is normal.  Negative for intracranial hemorrhage.  Negative for  infarct.  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  There also is  lipoma extending into the interhemispheric fissure.  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  Calvarium is intact.  IMPRESSION: Negative for intracranial hemorrhage.  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  Original Report Authenticated By: Camelia Phenes, M.D.     1. Headache       MDM  Pt with HA to parietal area x 4 days. Pt initially very uncomfortable appearing. Nontender to palp over mastoid, no erythema, no ear pain. 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 Pt was treated with decadron/reglan/benadryl cocktail and  had complete relief of her sx with this. Pt instructed to f/u with her PCP. Reasons to return to the ED discussed.        Grant Fontana, PA-C 04/21/12 1738

## 2012-04-23 NOTE — ED Provider Notes (Signed)
Medical screening examination/treatment/procedure(s) were performed by non-physician practitioner and as supervising physician I was immediately available for consultation/collaboration.  Rekita Miotke, MD 04/23/12 0739 

## 2012-09-01 ENCOUNTER — Telehealth: Payer: Self-pay | Admitting: Family Medicine

## 2012-09-01 MED ORDER — HYDROCODONE-ACETAMINOPHEN 7.5-325 MG PO TABS: ORAL_TABLET | ORAL | Status: DC

## 2012-09-01 NOTE — Telephone Encounter (Signed)
Pharm states they no longer make HYDROcodone-acetaminophen 7.5-750 MG  Tablet. Pt has called for refill. Need to substitute.  Pharm recommend hydrocodone 5-325. Pls adxise. Thanks! [facility] [address]

## 2012-09-03 ENCOUNTER — Other Ambulatory Visit: Payer: Self-pay | Admitting: Family Medicine

## 2012-09-03 NOTE — Telephone Encounter (Signed)
Spoke with pharmacy

## 2012-09-03 NOTE — Telephone Encounter (Signed)
Pharm needs to verify script called in 12/24 and the verify MD. He could not understand the message.

## 2012-10-08 ENCOUNTER — Encounter: Payer: Self-pay | Admitting: Family Medicine

## 2012-10-08 ENCOUNTER — Ambulatory Visit (INDEPENDENT_AMBULATORY_CARE_PROVIDER_SITE_OTHER): Payer: 59 | Admitting: Family Medicine

## 2012-10-08 VITALS — BP 120/80 | HR 78 | Temp 97.5°F | Wt 138.0 lb

## 2012-10-08 DIAGNOSIS — H6692 Otitis media, unspecified, left ear: Secondary | ICD-10-CM

## 2012-10-08 DIAGNOSIS — F17200 Nicotine dependence, unspecified, uncomplicated: Secondary | ICD-10-CM

## 2012-10-08 DIAGNOSIS — H6690 Otitis media, unspecified, unspecified ear: Secondary | ICD-10-CM

## 2012-10-08 DIAGNOSIS — J449 Chronic obstructive pulmonary disease, unspecified: Secondary | ICD-10-CM

## 2012-10-08 MED ORDER — VARENICLINE TARTRATE 1 MG PO TABS: ORAL_TABLET | ORAL | Status: DC

## 2012-10-08 MED ORDER — TRAMADOL HCL 50 MG PO TABS: ORAL_TABLET | ORAL | Status: DC

## 2012-10-08 MED ORDER — AMOXICILLIN 500 MG PO CAPS: 500.0000 mg | ORAL_CAPSULE | Freq: Three times a day (TID) | ORAL | Status: DC

## 2012-10-08 NOTE — Patient Instructions (Addendum)
Begin the amoxicillin 3 times daily for the ear infection  Tramadol......... one half to one tablet twice daily when necessary for pain  Restart the Chantix program by taking a half a tablet today Saturday and Monday then starting Tuesday a half a tablet every day in the morning  Taper your cigarettes as outlined  Return in 2 weeks for followup

## 2012-10-08 NOTE — Progress Notes (Signed)
  Subjective:    Patient ID: Stacy Garcia, female    DOB: [birth year], 62 y.o.   MRN: [id number]  HPI Jacia is a 62 year old female married smoker who comes in with a four-day history of left earache  She denies any fever sore throat etc. she has had a cough but no sputum production.  She continues to smoke daily. We gave her the Chantix program however she stopped the medication because it gave her nightmares and strange dreams. I explained her that's better than lung cancer   Review of Systems    review of systems otherwise negative Objective:   Physical Exam  Well-developed thin female no acute distress HEENT was pertinent he has a left otitis media with her eardrum retracted back in the year with a very red eardrum  Chest exam shows symmetrical but markedly decreased breath sounds pulse ox 98 at rest on room air      Assessment & Plan:

## 2012-10-22 ENCOUNTER — Ambulatory Visit: Payer: 59 | Admitting: Family Medicine

## 2012-11-09 ENCOUNTER — Other Ambulatory Visit: Payer: Self-pay | Admitting: Family Medicine

## 2012-11-09 ENCOUNTER — Encounter: Payer: Self-pay | Admitting: Family Medicine

## 2012-11-09 ENCOUNTER — Ambulatory Visit (INDEPENDENT_AMBULATORY_CARE_PROVIDER_SITE_OTHER): Payer: 59 | Admitting: Family Medicine

## 2012-11-09 VITALS — BP 130/80 | HR 90 | Temp 97.9°F | Wt 137.0 lb

## 2012-11-09 DIAGNOSIS — K529 Noninfective gastroenteritis and colitis, unspecified: Secondary | ICD-10-CM

## 2012-11-09 DIAGNOSIS — J069 Acute upper respiratory infection, unspecified: Secondary | ICD-10-CM

## 2012-11-09 MED ORDER — HYDROCODONE-HOMATROPINE 5-1.5 MG/5ML PO SYRP: 5.0000 mL | ORAL_SOLUTION | Freq: Three times a day (TID) | ORAL | Status: DC | PRN

## 2012-11-09 NOTE — Patient Instructions (Addendum)
-  plenty of fluids all day  -can use imodium (loperamide) for the diarrhea per instructions  -no dairy for 5 days after diarrhea resolves  -cough medicine at night if needed  -antibiotic if cough and congestion persist or worsen  -follow up as needed

## 2012-11-09 NOTE — Progress Notes (Signed)
Chief Complaint  Patient presents with  . Cough    HPI:  Acute visit for cough, diarrhea, HA: -started with a cold for the last few weeks with a cough, lots of nasal congestion, ears full -then had watery diarrhea yesterday -denies: fevers, blood in stools, SOB, vomiting, ear pain   ROS: See pertinent positives and negatives per HPI.  Past Medical History  Diagnosis Date  . Hyperlipidemia   . Tobacco abuse   . Vertigo   . Thyroid disease   . COPD (chronic obstructive pulmonary disease)     Family History  Problem Relation Age of Onset  . Thyroid disease Other   . Diabetes Other   . Hyperlipidemia Other   . Hypertension Other     History   Social History  . Marital Status: Married    Spouse Name: N/A    Number of Children: N/A  . Years of Education: N/A   Social History Main Topics  . Smoking status: Current Every Day Smoker  . Smokeless tobacco: None  . Alcohol Use: No  . Drug Use: No  . Sexually Active:    Other Topics Concern  . None   Social History Narrative  . None    Current outpatient prescriptions:aspirin 325 MG tablet, Take 325 mg by mouth daily.  , Disp: , Rfl: ;  diazepam (VALIUM) 2 MG tablet, One p.o. Nightly, p.r.n. vertigo, Disp: 100 tablet, Rfl: 3;  HYDROcodone-acetaminophen (NORCO) 7.5-325 MG per tablet, Half to one tab qhs prn, Disp: 30 tablet, Rfl: 0;  ibuprofen (ADVIL,MOTRIN) 200 MG tablet, Take 400 mg by mouth every 6 (six) hours as needed. For pain, Disp: , Rfl:  levothyroxine (SYNTHROID, LEVOTHROID) 100 MCG tablet, Take 1 tablet (100 mcg total) by mouth daily., Disp: 100 tablet, Rfl: 3;  simvastatin (ZOCOR) 40 MG tablet, Take 1 tablet (40 mg total) by mouth at bedtime., Disp: 100 tablet, Rfl: 3;  traMADol (ULTRAM) 50 MG tablet, One half to one tablet twice daily when necessary for severe pain, Disp: 30 tablet, Rfl: 0 varenicline (CHANTIX CONTINUING MONTH PAK) 1 MG tablet, One half tab every morning, Disp: 30 tablet, Rfl: 2;   HYDROcodone-homatropine (HYCODAN) 5-1.5 MG/5ML syrup, Take 5 mLs by mouth every 8 (eight) hours as needed for cough., Disp: 120 mL, Rfl: 0  EXAM:  Filed Vitals:   11/09/12 1045  BP: 130/80  Pulse: 90  Temp: 97.9 F (36.6 C)    Body mass index is 22.8 kg/(m^2).  GENERAL: vitals reviewed and listed above, alert, oriented, appears well hydrated and in no acute distress  HEENT: atraumatic, conjunttiva clear, no obvious abnormalities on inspection of external nose and ears, normal appearance of ear canals and TMs, clear nasal congestion, mild post oropharyngeal erythema with PND, no tonsillar edema or exudate, no sinus TTP  NECK: no obvious masses on inspection  LUNGS: clear to auscultation bilaterally, no wheezes, rales or rhonchi, good air movement  CV: HRRR, no peripheral edema  ABD: soft, NTTP, BS+  MS: moves all extremities without noticeable abnormality  PSYCH: pleasant and cooperative, no obvious depression or anxiety  ASSESSMENT AND PLAN:  Discussed the following assessment and plan:  Upper respiratory infection - Plan: HYDROcodone-homatropine (HYCODAN) 5-1.5 MG/5ML syrup  Gastroenteritis  -URI likley viral - but given length of symptoms and warranted - discussed risks and given currently with gastroenteritis, will give rx but she will hold of for a few days until diarrhe better then start if UR symptoms persist or worsen. Cough medicaiton given.  Supportive care for Viral gastroenteritis. Discussed risks of all meds. Return precautions. -Patient advised to return or notify a doctor immediately if symptoms worsen or persist or new concerns arise.  Patient Instructions  -plenty of fluids all day  -can use imodium (loperamide) for the diarrhea per instructions  -no dairy for 5 days after diarrhea resolves  -cough medicine at night if needed  -antibiotic if cough and congestion persist or worsen  -follow up as needed     Jumar Greenstreet R.

## 2012-11-10 ENCOUNTER — Telehealth: Payer: Self-pay | Admitting: Family Medicine

## 2012-11-10 MED ORDER — AZITHROMYCIN 250 MG PO TABS: ORAL_TABLET | ORAL | Status: DC

## 2012-11-10 NOTE — Telephone Encounter (Signed)
Ok sent rx.

## 2012-11-10 NOTE — Telephone Encounter (Signed)
Caller: Stacy Garcia/Patient; Phone: [phone]; Reason for Call: The [facility] was not called to the pharmacy.  Got the cough syrup only.  Seen by Dr.  Selena Batten on Monday 3/3.

## 2012-11-10 NOTE — Telephone Encounter (Signed)
Rx sent to pharmacy and patient is aware 

## 2012-11-19 ENCOUNTER — Encounter: Payer: Self-pay | Admitting: Family Medicine

## 2012-11-19 ENCOUNTER — Ambulatory Visit (INDEPENDENT_AMBULATORY_CARE_PROVIDER_SITE_OTHER): Payer: 59 | Admitting: Family Medicine

## 2012-11-19 VITALS — BP 120/90 | HR 94 | Temp 97.4°F | Wt 138.0 lb

## 2012-11-19 DIAGNOSIS — M79609 Pain in unspecified limb: Secondary | ICD-10-CM

## 2012-11-19 MED ORDER — HYDROCODONE-ACETAMINOPHEN 7.5-325 MG PO TABS: ORAL_TABLET | ORAL | Status: DC

## 2012-11-19 NOTE — Progress Notes (Signed)
Chief Complaint  Patient presents with  . left leg pain    HPI:  Acute visit for L leg pain: - started 3 days ago - has had lower back problems and radiation of pain into L leg many times in the past - PCP gives her vicodin for flares, has seen specialist in the past - pain is in calf, upper posterior and outer calf - achy pain - moderate to severe - worse with bending over table at work -has tried vicodin, asper cream and sports cream which help a little -denies: weakness numbness, bowel or bladder dysfunction -was out of work last week, work has been the same, can't think of inciting event  ROS: See pertinent positives and negatives per HPI.  Past Medical History  Diagnosis Date  . Hyperlipidemia   . Tobacco abuse   . Vertigo   . Thyroid disease   . COPD (chronic obstructive pulmonary disease)     Family History  Problem Relation Age of Onset  . Thyroid disease Other   . Diabetes Other   . Hyperlipidemia Other   . Hypertension Other     History   Social History  . Marital Status: Married    Spouse Name: N/A    Number of Children: N/A  . Years of Education: N/A   Social History Main Topics  . Smoking status: Current Every Day Smoker  . Smokeless tobacco: None  . Alcohol Use: No  . Drug Use: No  . Sexually Active:    Other Topics Concern  . None   Social History Narrative  . None    Current outpatient prescriptions:aspirin 325 MG tablet, Take 325 mg by mouth daily.  , Disp: , Rfl: ;  diazepam (VALIUM) 2 MG tablet, TAKE 1 TABLET BY MOUTH NIGHTLY AS NEEDED FOR VERTIGO, Disp: 100 tablet, Rfl: 1;  HYDROcodone-acetaminophen (NORCO) 7.5-325 MG per tablet, Half to one tab qhs prn, Disp: 30 tablet, Rfl: 0 HYDROcodone-homatropine (HYCODAN) 5-1.5 MG/5ML syrup, Take 5 mLs by mouth every 8 (eight) hours as needed for cough., Disp: 120 mL, Rfl: 0;  ibuprofen (ADVIL,MOTRIN) 200 MG tablet, Take 400 mg by mouth every 6 (six) hours as needed. For pain, Disp: , Rfl: ;   levothyroxine (SYNTHROID, LEVOTHROID) 100 MCG tablet, Take 1 tablet (100 mcg total) by mouth daily., Disp: 100 tablet, Rfl: 3 simvastatin (ZOCOR) 40 MG tablet, Take 1 tablet (40 mg total) by mouth at bedtime., Disp: 100 tablet, Rfl: 3;  traMADol (ULTRAM) 50 MG tablet, One half to one tablet twice daily when necessary for severe pain, Disp: 30 tablet, Rfl: 0;  varenicline (CHANTIX CONTINUING MONTH PAK) 1 MG tablet, One half tab every morning, Disp: 30 tablet, Rfl: 2  EXAM:  Filed Vitals:   11/19/12 1512  BP: 120/90  Pulse: 94  Temp: 97.4 F (36.3 C)    Body mass index is 22.96 kg/(m^2).  GENERAL: vitals reviewed and listed above, alert, oriented, appears well hydrated and in no acute distress  HEENT: atraumatic, conjunttiva clear, no obvious abnormalities on inspection of external nose and ears  NECK: no obvious masses on inspection  LUNGS: clear to auscultation bilaterally, no wheezes, rales or rhonchi, good air movement  CV: HRRR, no peripheral edema  MS: moves all extremities without noticeable abnormality Normal Gait Normal inspection of back, no obvious scoliosis or leg length descrepancy No bony TTP Soft tissue TTP at: L lumbar paraspinal muscles difusely and L PSIS -/+ tests: neg trendelenburg,-facet loading, -SLRT, -CLRT, +FABER on L, -FADIR  Normal muscle strength, sensation to light touch and DTRs in LEs bilaterally  PSYCH: pleasant and cooperative, no obvious depression or anxiety  ASSESSMENT AND PLAN:  Discussed the following assessment and plan:  Back pain - Plan: HYDROcodone-acetaminophen (NORCO) 7.5-325 MG per tablet  Leg pain, diffuse, left - Plan: HYDROcodone-acetaminophen (NORCO) 7.5-325 MG per tablet  -chronic low back and leg pain intermittent - suspect DDD with radicular pain versus sacroiliitis - offered referral to PMR. She wants to hold off on this. Will do HEP, supportive care. Refilled pain medication but advised of risks and advised to use sparingly  and I will not refill further. -Follow up with PCP in 4 weeks -Patient advised to return or notify a doctor immediately if symptoms worsen or persist or new concerns arise.  There are no Patient Instructions on file for this visit.   Kriste Basque R.

## 2012-11-19 NOTE — Patient Instructions (Addendum)
Please use heat for 15 minutes twice daily  Use the vicodin as needed  Back exercises

## 2012-12-02 ENCOUNTER — Telehealth: Payer: Self-pay | Admitting: Family Medicine

## 2012-12-02 NOTE — Telephone Encounter (Signed)
Left detailed message on machine for patient to call the orthopedic office for leg pain

## 2012-12-02 NOTE — Telephone Encounter (Signed)
Patient called stating that she would like a referral to a specialist for her leg pain. Please assist.

## 2012-12-02 NOTE — Telephone Encounter (Signed)
Stacy Garcia please call......... Tell her I am concerned about her cough and would recommend we get a pulmonary consult. Tell her we will call and arrange this to be done ASAP somebody will then call her with the date and time to go see the pulmonologist

## 2012-12-11 ENCOUNTER — Telehealth: Payer: Self-pay | Admitting: Family Medicine

## 2012-12-11 NOTE — Telephone Encounter (Signed)
Pt said she went to Dr Penni Bombard (orthopedic doc) and he told pt she should call our office and ask to increase her HYDROcodone-acetaminophen (NORCO) 7.5-325 MG per tablet to one tab every six hours.  Pt states she will need new RX in order to have enough to get her through. Pharm: [facility]/ [address]

## 2012-12-12 NOTE — Telephone Encounter (Signed)
Pls advise.  

## 2012-12-13 NOTE — Telephone Encounter (Signed)
Will see if PCP ok with having her take this much or if he wants ortho to rx.

## 2012-12-28 ENCOUNTER — Encounter: Payer: Self-pay | Admitting: Family Medicine

## 2013-02-01 ENCOUNTER — Other Ambulatory Visit: Payer: Self-pay | Admitting: Family Medicine

## 2013-02-02 ENCOUNTER — Other Ambulatory Visit: Payer: Self-pay | Admitting: Family Medicine

## 2013-03-03 ENCOUNTER — Other Ambulatory Visit: Payer: Self-pay | Admitting: Family Medicine

## 2013-03-04 ENCOUNTER — Ambulatory Visit (INDEPENDENT_AMBULATORY_CARE_PROVIDER_SITE_OTHER): Payer: 59 | Admitting: Family Medicine

## 2013-03-04 ENCOUNTER — Encounter: Payer: Self-pay | Admitting: Family Medicine

## 2013-03-04 ENCOUNTER — Ambulatory Visit (INDEPENDENT_AMBULATORY_CARE_PROVIDER_SITE_OTHER)
Admission: RE | Admit: 2013-03-04 | Discharge: 2013-03-04 | Disposition: A | Discharge status: Home / Self Care | Payer: 59 | Source: Ambulatory Visit | Attending: Family Medicine | Admitting: Family Medicine

## 2013-03-04 VITALS — BP 110/80 | Temp 98.2°F | Wt 141.0 lb

## 2013-03-04 DIAGNOSIS — F17200 Nicotine dependence, unspecified, uncomplicated: Secondary | ICD-10-CM

## 2013-03-04 DIAGNOSIS — M549 Dorsalgia, unspecified: Secondary | ICD-10-CM

## 2013-03-04 MED ORDER — PREDNISONE 20 MG PO TABS: ORAL_TABLET | ORAL | Status: DC

## 2013-03-04 MED ORDER — HYDROCODONE-ACETAMINOPHEN 7.5-325 MG/15ML PO SOLN: 15.0000 mL | Freq: Four times a day (QID) | ORAL | Status: AC | PRN

## 2013-03-04 MED ORDER — DIAZEPAM 5 MG PO TABS: 5.0000 mg | ORAL_TABLET | Freq: Two times a day (BID) | ORAL | Status: DC | PRN

## 2013-03-04 NOTE — Progress Notes (Signed)
  Subjective:    Patient ID: Stacy Garcia, female    DOB: 01-15-1951, 62 y.o.   MRN: [id number]  HPI Davia is a 62 year old female smoker who comes in today for evaluation of back pain  She saw Dr. Binnie Kand in March for the same problem. She was given medication and referred to [facility] orthopedics. She saw Dr. Rolly Salter for evaluation. He wanted to do an MRI but insurance wouldn't cover it. He treated her symptomatically and it seemed to improve. She had 2 physical therapy sessions. 2 weeks ago that cleared up with no history of trauma. Her pain now is constant sharp an 8 on a scale of 1-10. She points to her right lower back as a source for pain and states it radiates to her right lateral thigh. It's decreased by walking and increased by sitting. No history of trauma but she's been a smoker for many years.   Review of Systems    review of systems otherwise negative specifically no fever chills weight loss urinary tract changes or changes in bowel habits. She had her last period 14 years ago Objective:   Physical Exam Well-developed well-nourished female no acute distress examination the abdomen shows a scar in the midline from the pubis to the umbilicus from previous C-sections. Abdomen with normal bowel sounds normal liver spleen kidneys not enlarged no palpable masses.  In the supine position the skin looks normal pulses 1+ and symmetrical the legs are equal length. Sensation muscle strength reflexes all within normal limits. Straight leg raising +45 right and left referred to the right hip       Assessment & Plan:  Lumbar disc disease,,,,,,,,,,,,,, plan see orders

## 2013-03-04 NOTE — Patient Instructions (Addendum)
Go to the main office now for your back x-rays  Bedrest today Friday and Saturday,,,,,,,,,,, Sunday get up move around walk lie down but no sitting  Return on Monday for followup Valium and Vicodin............... One half to one of each 3 times daily  Prednisone 20 mg............ 2 tabs daily  No smoking!!!!!!!!!!!!!!!!!!

## 2013-03-08 ENCOUNTER — Encounter: Payer: Self-pay | Admitting: Family Medicine

## 2013-03-08 ENCOUNTER — Ambulatory Visit (INDEPENDENT_AMBULATORY_CARE_PROVIDER_SITE_OTHER): Payer: 59 | Admitting: Family Medicine

## 2013-03-08 VITALS — BP 110/70

## 2013-03-08 DIAGNOSIS — I714 Abdominal aortic aneurysm, without rupture: Secondary | ICD-10-CM

## 2013-03-08 DIAGNOSIS — M549 Dorsalgia, unspecified: Secondary | ICD-10-CM

## 2013-03-08 MED ORDER — OXYCODONE-ACETAMINOPHEN 5-325 MG PO TABS: ORAL_TABLET | ORAL | Status: DC

## 2013-03-08 NOTE — Patient Instructions (Signed)
Percocet and Valium,,,,,,,, one half to one of each 3 times daily  Bedrest at home  Prednisone 2 tabs daily  We will get you set her for an ultrasound of the aorta  Return on Thursday for followup  Note to stay out of work for a week

## 2013-03-08 NOTE — Progress Notes (Signed)
  Subjective:    Patient ID: Stacy Garcia, female    DOB: Jun 26, 1951, 62 y.o.   MRN: [id number]  HPI Stacy Garcia is a 62 year old female smoker who comes in today for followup of back pain  We saw her last Thursday with severe back pain. Neurologic exam was normal at the time. Would place her at bedrest for 48 hours. She try to work today but her pain is an 8 and she's miserable. The quality and type of pain is unchanged. We did some plain films of her spine on June 28. Shows some mild scoliosis narrowing L3-L4 multilevel osteophytes facet degenerative spondylolysis L5-S1 bilaterally the thecal distention from constipation and a 3.8 cm aneurysm.  She's been taking 2 prednisone tablets daily along with the Valium and the pain medicine and her pain is still an 8. She has not purchased the Chantix she continues to smoke  Review of Systems    review of systems otherwise negative Objective:   Physical Exam  Well-developed well nourished female no acute distress abdominal exam negative I cannot auscultate a bruit.  Neurologic and musculoskeletal exam again unchanged      Assessment & Plan:  Lumbar disease with severe pain,,,,,,,,,,,,bedrest continue medicine followup on Thursday if we see no improvement we will get a consult  AAA,,,,,, aortic ultrasound  Ongoing tobacco abuse again encouraged her to begin the Chantix and stop smoking

## 2013-03-09 ENCOUNTER — Other Ambulatory Visit: Payer: Self-pay | Admitting: *Deleted

## 2013-03-09 NOTE — Addendum Note (Signed)
Addended by: Willy Eddy on: 03/09/2013 02:22 PM   Modules accepted: Orders

## 2013-03-11 ENCOUNTER — Ambulatory Visit: Payer: 59 | Admitting: Family Medicine

## 2013-03-15 ENCOUNTER — Encounter (INDEPENDENT_AMBULATORY_CARE_PROVIDER_SITE_OTHER): Payer: 59

## 2013-03-15 DIAGNOSIS — I714 Abdominal aortic aneurysm, without rupture: Secondary | ICD-10-CM

## 2013-03-18 ENCOUNTER — Ambulatory Visit (INDEPENDENT_AMBULATORY_CARE_PROVIDER_SITE_OTHER): Payer: 59 | Admitting: Family Medicine

## 2013-03-18 ENCOUNTER — Encounter: Payer: Self-pay | Admitting: Family Medicine

## 2013-03-18 ENCOUNTER — Ambulatory Visit: Payer: 59 | Admitting: Family Medicine

## 2013-03-18 VITALS — BP 120/80 | HR 98 | Temp 97.6°F | Wt 142.0 lb

## 2013-03-18 DIAGNOSIS — M79605 Pain in left leg: Secondary | ICD-10-CM

## 2013-03-18 DIAGNOSIS — I714 Abdominal aortic aneurysm, without rupture: Secondary | ICD-10-CM

## 2013-03-18 DIAGNOSIS — M549 Dorsalgia, unspecified: Secondary | ICD-10-CM

## 2013-03-18 DIAGNOSIS — F17200 Nicotine dependence, unspecified, uncomplicated: Secondary | ICD-10-CM

## 2013-03-18 DIAGNOSIS — M79609 Pain in unspecified limb: Secondary | ICD-10-CM

## 2013-03-18 DIAGNOSIS — J449 Chronic obstructive pulmonary disease, unspecified: Secondary | ICD-10-CM

## 2013-03-18 MED ORDER — DIAZEPAM 5 MG PO TABS: ORAL_TABLET | ORAL | Status: DC

## 2013-03-18 MED ORDER — HYDROCODONE-ACETAMINOPHEN 7.5-325 MG PO TABS: ORAL_TABLET | ORAL | Status: DC

## 2013-03-18 NOTE — Patient Instructions (Addendum)
Begin the Chantix one half tab daily in the morning  Taper as outlined  Valium and Vicodin......... One half of each twice daily for pain control  Return in 6 weeks for followup sooner if any problems  If your pain gets worse or you develop neurologic symptoms call and we will get you set up to see a spine specialist  Avoid sitting  Save the Percocet to take one half to one tablet at bedtime for very severe pain  Take a stool softener daily to avoid constipation

## 2013-03-18 NOTE — Progress Notes (Signed)
  Subjective:    Patient ID: Stacy Garcia, female    DOB: May 29, 1951, 62 y.o.   MRN: [id number]  HPI Stacy Garcia is a 62 year old female smoker pack of cigarettes a day is in and COPD who comes in today for followup of back pain  We saw her last week for severe back pain. She was neurologically intact. Plain films showed some narrowing L3-L4. Also showed a lot of calcification and enlarged aortic aneurysm 3.8 cm. We did an ultrasound to confirm that. Ultrasound confirms the aneurysm.  We've had her on prednisone 40 mg daily with a taper she's now down to 20 mg daily for the last 4 days. He is taking Valium 5 mg twice a day and Vicodin one half to one tablet twice a day for back pain. At this juncture since she's not improved I recommend she see a spine specialist for further evaluation however she declines. She says she doesn't have enough money to see the specialist and she's not met her deductible She has purchased the Chantix but is not taking it. We explained to her the issue is smoking decrease blood flow of spinal disease along with her COPD.  Review of Systems    review of systems otherwise negative Objective:   Physical Exam Well-developed well-nourished female no acute distress examination of back shows no palpable tenderness. Neurologic exam continues to be normal  Lungs show symmetrical decreased breath sounds no wheezing       Assessment & Plan:  Lumbar disease not improved with conservative therapy patient declines specialist evaluation at this juncture  COPD tobacco abuse start the smoking cessation program followup in

## 2013-05-16 ENCOUNTER — Other Ambulatory Visit: Payer: Self-pay | Admitting: Family Medicine

## 2013-06-24 ENCOUNTER — Telehealth: Payer: Self-pay | Admitting: Family Medicine

## 2013-06-24 DIAGNOSIS — M79605 Pain in left leg: Secondary | ICD-10-CM

## 2013-06-24 DIAGNOSIS — M549 Dorsalgia, unspecified: Secondary | ICD-10-CM

## 2013-06-24 MED ORDER — HYDROCODONE-ACETAMINOPHEN 7.5-325 MG PO TABS: ORAL_TABLET | ORAL | Status: DC

## 2013-06-24 NOTE — Telephone Encounter (Signed)
Rx ready for pick up.  Per Dr Tawanna Cooler patient should go to Ortho evaluation of her back pain.

## 2013-06-24 NOTE — Telephone Encounter (Signed)
Pt request refill HYDROcodone-acetaminophen (NORCO) 7.5-325 MG per tablet   60 tabs

## 2013-08-24 ENCOUNTER — Encounter: Payer: Self-pay | Admitting: *Deleted

## 2013-08-25 ENCOUNTER — Other Ambulatory Visit (INDEPENDENT_AMBULATORY_CARE_PROVIDER_SITE_OTHER): Payer: 59

## 2013-08-25 DIAGNOSIS — Z0000 Encounter for general adult medical examination without abnormal findings: Secondary | ICD-10-CM

## 2013-08-25 LAB — LIPID PANEL
Cholesterol: 188 mg/dL (ref 0–200)
HDL: 46.8 mg/dL (ref >39.00)
VLDL: 27.4 mg/dL (ref 0.0–40.0)

## 2013-08-25 LAB — CBC WITH DIFFERENTIAL/PLATELET
Eosinophils Absolute: 0.2 10*3/uL (ref 0.0–0.7)
Lymphs Abs: 2.8 10*3/uL (ref 0.7–4.0)
MCHC: 34 g/dL (ref 30.0–36.0)
MCV: 92.4 fl (ref 78.0–100.0)
Monocytes Absolute: 0.4 10*3/uL (ref 0.1–1.0)
Neutrophils Relative %: 51.7 % (ref 43.0–77.0)
Platelets: 221 10*3/uL (ref 150.0–400.0)

## 2013-08-25 LAB — POCT URINALYSIS DIPSTICK
Bilirubin, UA: NEGATIVE
Ketones, UA: NEGATIVE
Protein, UA: NEGATIVE
Spec Grav, UA: 1.015

## 2013-08-25 LAB — HEPATIC FUNCTION PANEL
AST: 15 U/L (ref 0–37)
Albumin: 4.4 g/dL (ref 3.5–5.2)
Alkaline Phosphatase: 75 U/L (ref 39–117)
Bilirubin, Direct: 0 mg/dL (ref 0.0–0.3)
Total Bilirubin: 0.4 mg/dL (ref 0.3–1.2)

## 2013-08-25 LAB — TSH: TSH: 0.08 u[IU]/mL — ABNORMAL LOW (ref 0.35–5.50)

## 2013-08-25 LAB — BASIC METABOLIC PANEL
BUN: 14 mg/dL (ref 6–23)
Calcium: 9.6 mg/dL (ref 8.4–10.5)
GFR: 82.46 mL/min (ref >60.00)
Potassium: 5.2 mEq/L — ABNORMAL HIGH (ref 3.5–5.1)
Sodium: 142 mEq/L (ref 135–145)

## 2013-08-27 ENCOUNTER — Other Ambulatory Visit: Payer: Self-pay | Admitting: Family Medicine

## 2013-08-27 DIAGNOSIS — E039 Hypothyroidism, unspecified: Secondary | ICD-10-CM

## 2013-08-30 ENCOUNTER — Other Ambulatory Visit (INDEPENDENT_AMBULATORY_CARE_PROVIDER_SITE_OTHER): Payer: 59

## 2013-08-30 DIAGNOSIS — E039 Hypothyroidism, unspecified: Secondary | ICD-10-CM

## 2013-09-01 ENCOUNTER — Encounter: Payer: Self-pay | Admitting: Family

## 2013-09-01 ENCOUNTER — Encounter: Payer: 59 | Admitting: Family Medicine

## 2013-09-01 ENCOUNTER — Ambulatory Visit (INDEPENDENT_AMBULATORY_CARE_PROVIDER_SITE_OTHER): Payer: 59 | Admitting: Family

## 2013-09-01 VITALS — BP 106/60 | HR 80 | Ht 64.5 in | Wt 139.0 lb

## 2013-09-01 DIAGNOSIS — E039 Hypothyroidism, unspecified: Secondary | ICD-10-CM

## 2013-09-01 DIAGNOSIS — E7800 Pure hypercholesterolemia, unspecified: Secondary | ICD-10-CM

## 2013-09-01 DIAGNOSIS — Z23 Encounter for immunization: Secondary | ICD-10-CM

## 2013-09-01 DIAGNOSIS — M549 Dorsalgia, unspecified: Secondary | ICD-10-CM

## 2013-09-01 DIAGNOSIS — Z0000 Encounter for general adult medical examination without abnormal findings: Secondary | ICD-10-CM

## 2013-09-01 MED ORDER — DIAZEPAM 5 MG PO TABS: ORAL_TABLET | ORAL | Status: DC

## 2013-09-01 MED ORDER — LEVOTHYROXINE SODIUM 88 MCG PO TABS: 88.0000 ug | ORAL_TABLET | Freq: Every day | ORAL | Status: DC

## 2013-09-01 MED ORDER — SIMVASTATIN 40 MG PO TABS: ORAL_TABLET | ORAL | Status: DC

## 2013-09-01 NOTE — Progress Notes (Signed)
Subjective:    Patient ID: Stacy Garcia, female    DOB: 1951-01-16, 62 y.o.   MRN: [id number]  HPI 62 year old female is in for a routine physical examination for this healthy  Female. Reviewed all health maintenance protocols including mammography colonoscopy bone density and reviewed appropriate screening labs. Her immunization history was reviewed as well as her current medications and allergies refills of her chronic medications were given and the plan for yearly health maintenance was discussed all orders and referrals were made as appropriate.   Review of Systems  Constitutional: Negative.   HENT: Negative.   Eyes: Negative.   Respiratory: Negative.   Cardiovascular: Negative.   Gastrointestinal: Negative.   Endocrine: Negative.   Genitourinary: Negative.   Musculoskeletal: Negative.   Skin: Negative.   Allergic/Immunologic: Negative.   Neurological: Negative.   Hematological: Negative.   Psychiatric/Behavioral: Negative.    Past Medical History  Diagnosis Date  . Hyperlipidemia   . Tobacco abuse   . Vertigo   . Thyroid disease   . COPD (chronic obstructive pulmonary disease)     History   Social History  . Marital Status: Married    Spouse Name: N/A    Number of Children: N/A  . Years of Education: N/A   Occupational History  . Not on file.   Social History Main Topics  . Smoking status: Current Every Day Smoker  . Smokeless tobacco: Not on file  . Alcohol Use: No  . Drug Use: No  . Sexual Activity:    Other Topics Concern  . Not on file   Social History Narrative  . No narrative on file    History reviewed. No pertinent past surgical history.  Family History  Problem Relation Age of Onset  . Thyroid disease Other   . Diabetes Other   . Hyperlipidemia Other   . Hypertension Other     No Known Allergies  Current Outpatient Prescriptions on File Prior to Visit  Medication Sig Dispense Refill  . aspirin 325 MG tablet Take 325 mg by mouth  daily.        Marland Kitchen HYDROcodone-acetaminophen (HYCET) 7.5-325 mg/15 ml solution Take 15 mLs by mouth 4 (four) times daily as needed for pain.  120 mL  0  . HYDROcodone-acetaminophen (NORCO) 7.5-325 MG per tablet One half to one tablet twice daily for back pain  60 tablet  0  . ibuprofen (ADVIL,MOTRIN) 200 MG tablet Take 400 mg by mouth every 6 (six) hours as needed. For pain      . oxyCODONE-acetaminophen (ROXICET) 5-325 MG per tablet One half to one tablet 3 times daily for severe pain  50 tablet  0  . predniSONE (DELTASONE) 20 MG tablet 2 tabs x3 days, 1 tab x3 days, a half a tab x3 days, then a half a tab Monday Wednesday Friday for a 3 week taper  50 tablet  1  . varenicline (CHANTIX CONTINUING MONTH PAK) 1 MG tablet One half tab every morning  30 tablet  2   No current facility-administered medications on file prior to visit.    BP 106/60  Pulse 80  Ht 5' 4.5" (1.638 m)  Wt 139 lb (63.05 kg)  BMI 23.50 kg/m2chart    Objective:   Physical Exam  Constitutional: She is oriented to person, place, and time. She appears well-developed and well-nourished.  HENT:  Head: Normocephalic and atraumatic.  Right Ear: External ear normal.  Left Ear: External ear normal.  Nose: Nose  normal.  Mouth/Throat: Oropharynx is clear and moist.  Eyes: Conjunctivae and EOM are normal. Pupils are equal, round, and reactive to light.  Neck: Normal range of motion. Neck supple. No thyromegaly present.  Cardiovascular: Normal rate, regular rhythm and normal heart sounds.   Pulmonary/Chest: Effort normal and breath sounds normal.  Abdominal: Soft. Bowel sounds are normal. She exhibits no distension. There is no tenderness. There is no rebound.  Musculoskeletal: Normal range of motion. She exhibits no edema and no tenderness.  Neurological: She is alert and oriented to person, place, and time. She has normal reflexes. She displays normal reflexes. No cranial nerve deficit. Coordination normal.  Skin: Skin is warm  and dry.  Psychiatric: She has a normal mood and affect.          Assessment & Plan:  Assessment:  1. CPX 2. AAA 3. Hypothyroidism 4. COPD  Plan: Encouraged a healthy diet, exercise, smoking cessation, self breast exam. Call the office with any questions or concerns.

## 2013-09-01 NOTE — Patient Instructions (Addendum)
Fat and Cholesterol Control Diet  Fat and cholesterol levels in your blood and organs are influenced by your diet. High levels of fat and cholesterol may lead to diseases of the heart, small and large blood vessels, gallbladder, liver, and pancreas.  CONTROLLING FAT AND CHOLESTEROL WITH DIET  Although exercise and lifestyle factors are important, your diet is key. That is because certain foods are known to raise cholesterol and others to lower it. The goal is to balance foods for their effect on cholesterol and more importantly, to replace saturated and trans fat with other types of fat, such as monounsaturated fat, polyunsaturated fat, and omega-3 fatty acids.  On average, a person should consume no more than 15 to 17 g of saturated fat daily. Saturated and trans fats are considered "bad" fats, and they will raise LDL cholesterol. Saturated fats are primarily found in animal products such as meats, butter, and cream. However, that does not mean you need to give up all your favorite foods. Today, there are good tasting, low-fat, low-cholesterol substitutes for most of the things you like to eat. Choose low-fat or nonfat alternatives. Choose round or loin cuts of red meat. These types of cuts are lowest in fat and cholesterol. Chicken (without the skin), fish, veal, and ground turkey breast are great choices. Eliminate fatty meats, such as hot dogs and salami. Even shellfish have little or no saturated fat. Have a 3 oz (85 g) portion when you eat lean meat, poultry, or fish.  Trans fats are also called "partially hydrogenated oils." They are oils that have been scientifically manipulated so that they are solid at room temperature resulting in a longer shelf life and improved taste and texture of foods in which they are added. Trans fats are found in stick margarine, some tub margarines, cookies, crackers, and baked goods.   When baking and cooking, oils are a great substitute for butter. The monounsaturated oils are  especially beneficial since it is believed they lower LDL and raise HDL. The oils you should avoid entirely are saturated tropical oils, such as coconut and palm.   Remember to eat a lot from food groups that are naturally free of saturated and trans fat, including fish, fruit, vegetables, beans, grains (barley, rice, couscous, bulgur wheat), and pasta (without cream sauces).   IDENTIFYING FOODS THAT LOWER FAT AND CHOLESTEROL   Soluble fiber may lower your cholesterol. This type of fiber is found in fruits such as apples, vegetables such as broccoli, potatoes, and carrots, legumes such as beans, peas, and lentils, and grains such as barley. Foods fortified with plant sterols (phytosterol) may also lower cholesterol. You should eat at least 2 g per day of these foods for a cholesterol lowering effect.   Read package labels to identify low-saturated fats, trans fat free, and low-fat foods at the supermarket. Select cheeses that have only 2 to 3 g saturated fat per ounce. Use a heart-healthy tub margarine that is free of trans fats or partially hydrogenated oil. When buying baked goods (cookies, crackers), avoid partially hydrogenated oils. Breads and muffins should be made from whole grains (whole-wheat or whole oat flour, instead of "flour" or "enriched flour"). Buy non-creamy canned soups with reduced salt and no added fats.   FOOD PREPARATION TECHNIQUES   Never deep-fry. If you must fry, either stir-fry, which uses very little fat, or use non-stick cooking sprays. When possible, broil, bake, or roast meats, and steam vegetables. Instead of putting butter or margarine on vegetables, use lemon   and herbs, applesauce, and cinnamon (for squash and sweet potatoes). Use nonfat yogurt, salsa, and low-fat dressings for salads.   LOW-SATURATED FAT / LOW-FAT FOOD SUBSTITUTES  Meats / Saturated Fat (g)  · Avoid: Steak, marbled (3 oz/85 g) / 11 g  · Choose: Steak, lean (3 oz/85 g) / 4 g  · Avoid: Hamburger (3 oz/85 g) / 7  g  · Choose: Hamburger, lean (3 oz/85 g) / 5 g  · Avoid: Ham (3 oz/85 g) / 6 g  · Choose: Ham, lean cut (3 oz/85 g) / 2.4 g  · Avoid: Chicken, with skin, dark meat (3 oz/85 g) / 4 g  · Choose: Chicken, skin removed, dark meat (3 oz/85 g) / 2 g  · Avoid: Chicken, with skin, light meat (3 oz/85 g) / 2.5 g  · Choose: Chicken, skin removed, light meat (3 oz/85 g) / 1 g  Dairy / Saturated Fat (g)  · Avoid: Whole milk (1 cup) / 5 g  · Choose: Low-fat milk, 2% (1 cup) / 3 g  · Choose: Low-fat milk, 1% (1 cup) / 1.5 g  · Choose: Skim milk (1 cup) / 0.3 g  · Avoid: Hard cheese (1 oz/28 g) / 6 g  · Choose: Skim milk cheese (1 oz/28 g) / 2 to 3 g  · Avoid: Cottage cheese, 4% fat (1 cup) / 6.5 g  · Choose: Low-fat cottage cheese, 1% fat (1 cup) / 1.5 g  · Avoid: Ice cream (1 cup) / 9 g  · Choose: Sherbet (1 cup) / 2.5 g  · Choose: Nonfat frozen yogurt (1 cup) / 0.3 g  · Choose: Frozen fruit bar / trace  · Avoid: Whipped cream (1 tbs) / 3.5 g  · Choose: Nondairy whipped topping (1 tbs) / 1 g  Condiments / Saturated Fat (g)  · Avoid: Mayonnaise (1 tbs) / 2 g  · Choose: Low-fat mayonnaise (1 tbs) / 1 g  · Avoid: Butter (1 tbs) / 7 g  · Choose: Extra light margarine (1 tbs) / 1 g  · Avoid: Coconut oil (1 tbs) / 11.8 g  · Choose: Olive oil (1 tbs) / 1.8 g  · Choose: Corn oil (1 tbs) / 1.7 g  · Choose: Safflower oil (1 tbs) / 1.2 g  · Choose: Sunflower oil (1 tbs) / 1.4 g  · Choose: Soybean oil (1 tbs) / 2.4 g  · Choose: Canola oil (1 tbs) / 1 g  Document Released: 08/26/2005 Document Revised: 12/21/2012 Document Reviewed: 02/14/2011  [employer]® Patient Information ©2014 [employer].

## 2013-11-17 ENCOUNTER — Telehealth: Payer: Self-pay | Admitting: Family Medicine

## 2013-11-17 NOTE — Telephone Encounter (Signed)
[facility] [place], [facility] [address] requesting new script for  simvastatin (ZOCOR) 40 MG tablet levothyroxine (SYNTHROID, LEVOTHROID) 88 MCG tablet

## 2013-11-18 MED ORDER — SIMVASTATIN 40 MG PO TABS: 40.0000 mg | ORAL_TABLET | Freq: Every day | ORAL | Status: DC

## 2013-11-18 MED ORDER — LEVOTHYROXINE SODIUM 88 MCG PO TABS: 88.0000 ug | ORAL_TABLET | Freq: Every day | ORAL | Status: DC

## 2013-11-18 NOTE — Telephone Encounter (Signed)
Rx's sent to [facility].

## 2014-03-23 ENCOUNTER — Other Ambulatory Visit (HOSPITAL_COMMUNITY): Payer: Self-pay | Admitting: Cardiology

## 2014-03-23 DIAGNOSIS — I714 Abdominal aortic aneurysm, without rupture, unspecified: Secondary | ICD-10-CM

## 2014-03-29 ENCOUNTER — Ambulatory Visit (HOSPITAL_COMMUNITY): Payer: Medicare HMO | Attending: Family Medicine | Admitting: Radiology

## 2014-03-29 DIAGNOSIS — I714 Abdominal aortic aneurysm, without rupture, unspecified: Secondary | ICD-10-CM | POA: Insufficient documentation

## 2014-03-29 DIAGNOSIS — J4489 Other specified chronic obstructive pulmonary disease: Secondary | ICD-10-CM | POA: Insufficient documentation

## 2014-03-29 DIAGNOSIS — J449 Chronic obstructive pulmonary disease, unspecified: Secondary | ICD-10-CM | POA: Insufficient documentation

## 2014-03-29 DIAGNOSIS — F17200 Nicotine dependence, unspecified, uncomplicated: Secondary | ICD-10-CM | POA: Insufficient documentation

## 2014-03-29 NOTE — Progress Notes (Signed)
Abdominal aorta Duplex performed. 

## 2014-04-06 ENCOUNTER — Encounter: Payer: Self-pay | Admitting: Gastroenterology

## 2014-04-28 ENCOUNTER — Ambulatory Visit (INDEPENDENT_AMBULATORY_CARE_PROVIDER_SITE_OTHER): Payer: Commercial Managed Care - HMO | Admitting: Family Medicine

## 2014-04-28 ENCOUNTER — Encounter: Payer: Self-pay | Admitting: Family Medicine

## 2014-04-28 VITALS — BP 110/80 | Temp 97.5°F | Wt 135.0 lb

## 2014-04-28 DIAGNOSIS — M25559 Pain in unspecified hip: Secondary | ICD-10-CM

## 2014-04-28 DIAGNOSIS — M79605 Pain in left leg: Secondary | ICD-10-CM | POA: Insufficient documentation

## 2014-04-28 DIAGNOSIS — F17200 Nicotine dependence, unspecified, uncomplicated: Secondary | ICD-10-CM

## 2014-04-28 DIAGNOSIS — R1013 Epigastric pain: Secondary | ICD-10-CM

## 2014-04-28 DIAGNOSIS — M79609 Pain in unspecified limb: Secondary | ICD-10-CM

## 2014-04-28 DIAGNOSIS — M25551 Pain in right hip: Secondary | ICD-10-CM | POA: Insufficient documentation

## 2014-04-28 LAB — HEPATIC FUNCTION PANEL
ALBUMIN: 3.9 g/dL (ref 3.5–5.2)
ALT: 15 U/L (ref 0–35)
AST: 19 U/L (ref 0–37)
Alkaline Phosphatase: 75 U/L (ref 39–117)
BILIRUBIN DIRECT: 0 mg/dL (ref 0.0–0.3)
Total Bilirubin: 0.4 mg/dL (ref 0.2–1.2)
Total Protein: 6.6 g/dL (ref 6.0–8.3)

## 2014-04-28 LAB — BASIC METABOLIC PANEL
BUN: 15 mg/dL (ref 6–23)
CHLORIDE: 104 meq/L (ref 96–112)
CO2: 28 meq/L (ref 19–32)
CREATININE: 0.9 mg/dL (ref 0.4–1.2)
Calcium: 9.5 mg/dL (ref 8.4–10.5)
GFR: 64.2 mL/min (ref >60.00)
GLUCOSE: 119 mg/dL — AB (ref 70–99)
Potassium: 3.6 mEq/L (ref 3.5–5.1)
Sodium: 139 mEq/L (ref 135–145)

## 2014-04-28 LAB — CBC WITH DIFFERENTIAL/PLATELET
BASOS PCT: 0.5 % (ref 0.0–3.0)
Basophils Absolute: 0 10*3/uL (ref 0.0–0.1)
EOS PCT: 2.7 % (ref 0.0–5.0)
Eosinophils Absolute: 0.2 10*3/uL (ref 0.0–0.7)
HEMATOCRIT: 46.2 % — AB (ref 36.0–46.0)
HEMOGLOBIN: 15.4 g/dL — AB (ref 12.0–15.0)
LYMPHS ABS: 3.6 10*3/uL (ref 0.7–4.0)
Lymphocytes Relative: 47 % — ABNORMAL HIGH (ref 12.0–46.0)
MCHC: 33.4 g/dL (ref 30.0–36.0)
MCV: 93.7 fl (ref 78.0–100.0)
MONOS PCT: 4.6 % (ref 3.0–12.0)
Monocytes Absolute: 0.4 10*3/uL (ref 0.1–1.0)
NEUTROS ABS: 3.5 10*3/uL (ref 1.4–7.7)
Neutrophils Relative %: 45.2 % (ref 43.0–77.0)
Platelets: 201 10*3/uL (ref 150.0–400.0)
RBC: 4.93 Mil/uL (ref 3.87–5.11)
RDW: 13.6 % (ref 11.5–15.5)
WBC: 7.7 10*3/uL (ref 4.0–10.5)

## 2014-04-28 LAB — H. PYLORI ANTIBODY, IGG: H Pylori IgG: POSITIVE — AB

## 2014-04-28 LAB — TSH: TSH: 0.03 u[IU]/mL — ABNORMAL LOW (ref 0.35–4.50)

## 2014-04-28 MED ORDER — TRAMADOL HCL 50 MG PO TABS: ORAL_TABLET | ORAL | Status: DC

## 2014-04-28 NOTE — Patient Instructions (Signed)
No aspirin or aspirin products  Tramadol,,,,,,,,,,, one half to one tablet twice daily for pain  Prilosec OTC........... one twice daily  Labs today  Followup in one week

## 2014-04-28 NOTE — Progress Notes (Signed)
   Subjective:    Patient ID: Stacy Garcia, female    DOB: Jan 17, 1951, 63 y.o.   MRN: [id number]  HPI Sharlyne is a 63 year old female smoker a pack of cigarettes a day...Marland KitchenMarland KitchenMarland Kitchen no alcohol....... who comes in today for evaluation of multiple problems  She noticed right hip pain about 6 days ago. She went to get out of bed on a Saturday morning and couldn't get out of bed she had such severe pain. No previous history of trauma. 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  She's also pain in her side of her left thigh for 3 weeks no history of trauma  She's also had midepigastric abdominal pain for one year. She describes the pain as constant. No nausea vomiting diarrhea rate loss or change in bowel habits. She's had a colonoscopy in the past which was normal.   Review of Systems Review of systems otherwise negative     Objective:   Physical Exam Well-developed well-nourished female no acute distress examination the abdomen......... abdomen is soft bowel sounds normal liver spleen kidney not enlarged I can appreciate no masses. 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 Right hip shows marked decreased range of motion consistent with arthritis  Left inner thigh no palpable masses       Assessment & Plan:1 years history of midepigastric abdominal pain 1 year history  One year history of midepigastric abdominal pain..........Marland Kitchen may be secondary to tobacco and NSAIDs........ check labs rule out H. pylori  Right hip pain......... tramadol 50 mg twice a day  Pain in her side left eye......... observe  Tobacco abuse......... declined a smoking cessation program.

## 2014-04-29 ENCOUNTER — Telehealth: Payer: Self-pay | Admitting: Family Medicine

## 2014-04-29 NOTE — Telephone Encounter (Signed)
Relevant patient education mailed to patient.  

## 2014-05-03 ENCOUNTER — Encounter: Payer: Self-pay | Admitting: Family Medicine

## 2014-05-03 ENCOUNTER — Ambulatory Visit (INDEPENDENT_AMBULATORY_CARE_PROVIDER_SITE_OTHER): Payer: Commercial Managed Care - HMO | Admitting: Family Medicine

## 2014-05-03 VITALS — BP 110/70

## 2014-05-03 DIAGNOSIS — A048 Other specified bacterial intestinal infections: Secondary | ICD-10-CM

## 2014-05-03 DIAGNOSIS — F17200 Nicotine dependence, unspecified, uncomplicated: Secondary | ICD-10-CM

## 2014-05-03 DIAGNOSIS — E039 Hypothyroidism, unspecified: Secondary | ICD-10-CM

## 2014-05-03 DIAGNOSIS — B9681 Helicobacter pylori [H. pylori] as the cause of diseases classified elsewhere: Secondary | ICD-10-CM

## 2014-05-03 DIAGNOSIS — Q400 Congenital hypertrophic pyloric stenosis: Secondary | ICD-10-CM

## 2014-05-03 DIAGNOSIS — K279 Peptic ulcer, site unspecified, unspecified as acute or chronic, without hemorrhage or perforation: Secondary | ICD-10-CM

## 2014-05-03 MED ORDER — CLARITHROMYCIN 500 MG PO TABS: 500.0000 mg | ORAL_TABLET | Freq: Two times a day (BID) | ORAL | Status: DC

## 2014-05-03 MED ORDER — OMEPRAZOLE 40 MG PO CPDR: 40.0000 mg | DELAYED_RELEASE_CAPSULE | Freq: Every day | ORAL | Status: DC

## 2014-05-03 MED ORDER — AMOXICILLIN 500 MG PO CAPS: ORAL_CAPSULE | ORAL | Status: DC

## 2014-05-03 NOTE — Patient Instructions (Signed)
Biaxin 500 mg............ one twice daily for 2 weeks  Amoxicillin 500 mg...... 2 tabs twice daily for 2 weeks  Omeprazole 40 mg........ one twice daily for 2 weeks  Followup in 3 weeks  Begin tapering by 3 per week  Synthroid 80 mcg........... hold until next Monday didn't take one Monday Wednesday Friday only

## 2014-05-03 NOTE — Progress Notes (Signed)
Pre visit review using our clinic review tool, if applicable. No additional management support is needed unless otherwise documented below in the visit note. 

## 2014-05-03 NOTE — Progress Notes (Signed)
   Subjective:    Patient ID: Stacy Garcia, female    DOB: 05/08/1951, 63 y.o.   MRN: [id number]  HPI Stacy Garcia is a 63 year old female who comes in today for followup of 3 problems  We saw her a week ago with abdominal pain. Her H. pylori turned positive. We'll start her on triple therapy  Her TSH is still low on 80 mcg of thyroid daily. We'll decrease the dose and followup on that  She also continues to smoke a pack of cigarettes a day she agrees to try a tapering program but declines Chantix   Review of Systems    review of systems negative Objective:   Physical Exam  Well-developed and nourished thin female no acute distress signs stable she is afebrile      Assessment & Plan:  Peptic ulcer disease H. pylori positive....... triple therapy followup in 2 weeks  Tobacco abuse.........Marland Kitchen begin tapering  ....... Hypothyroidism.........Marland Kitchen Decrease Synthroid to 1 tablet Monday Wednesday Friday

## 2014-05-24 ENCOUNTER — Encounter: Payer: Self-pay | Admitting: Family Medicine

## 2014-05-24 ENCOUNTER — Ambulatory Visit: Payer: Commercial Managed Care - HMO | Admitting: Family Medicine

## 2014-05-24 ENCOUNTER — Ambulatory Visit (INDEPENDENT_AMBULATORY_CARE_PROVIDER_SITE_OTHER): Payer: Commercial Managed Care - HMO | Admitting: Family Medicine

## 2014-05-24 VITALS — BP 110/80 | Wt 135.0 lb

## 2014-05-24 DIAGNOSIS — K279 Peptic ulcer, site unspecified, unspecified as acute or chronic, without hemorrhage or perforation: Secondary | ICD-10-CM

## 2014-05-24 DIAGNOSIS — B9681 Helicobacter pylori [H. pylori] as the cause of diseases classified elsewhere: Secondary | ICD-10-CM

## 2014-05-24 DIAGNOSIS — F17200 Nicotine dependence, unspecified, uncomplicated: Secondary | ICD-10-CM

## 2014-05-24 DIAGNOSIS — A048 Other specified bacterial intestinal infections: Secondary | ICD-10-CM

## 2014-05-24 NOTE — Patient Instructions (Signed)
Chantix 1 mg............ one half tablet Monday and Thursday for 2 weeks then one half tablet Monday Wednesday Friday  Taper by one per week  Followup in 6 weeks

## 2014-05-24 NOTE — Progress Notes (Signed)
Pre visit review using our clinic review tool, if applicable. No additional management support is needed unless otherwise documented below in the visit note. 

## 2014-05-24 NOTE — Progress Notes (Signed)
   Subjective:    Patient ID: HAISLEY ARENS, female    DOB: October 09, 1950, 63 y.o.   MRN: [id number]  HPI Lynell is a 63 year old female who comes in today for followup of H. pylori ulcer disease  We saw her a couple weeks ago with symptoms of abdominal pain. H. pylori was positive. We put on a combination of amoxicillin and Biaxin and she's done well. She's finished her medicine she is now pain-free.  She smoking 10 cigarettes a day. She did side this Chantix program however she said it goes to have strange dreams and nightmares,,, but it did help her stop smoking   Review of Systems    review of systems otherwise negative Objective:   Physical Exam  Well-developed well-nourished female no acute distress vital signs stable she's afebrile      Assessment & Plan:  H. pylori ulcer disease resolved with antibiotics  Tobacco abuse..........Marland Kitchen restart Chantix

## 2014-06-20 ENCOUNTER — Other Ambulatory Visit: Payer: Self-pay | Admitting: Family

## 2014-07-05 ENCOUNTER — Ambulatory Visit: Payer: Commercial Managed Care - HMO | Admitting: Family Medicine

## 2014-07-16 LAB — HM MAMMOGRAPHY

## 2014-07-19 ENCOUNTER — Encounter: Payer: Self-pay | Admitting: Family Medicine

## 2014-08-22 ENCOUNTER — Telehealth: Payer: Self-pay | Admitting: Family Medicine

## 2014-08-22 NOTE — Telephone Encounter (Signed)
Pt states she has sharp pain down her right leg, that will not go away and it hurts to go to the restroom. Pain since sat. Pt prefers to see dr todd.

## 2014-08-22 NOTE — Telephone Encounter (Signed)
Appointment made

## 2014-08-23 ENCOUNTER — Ambulatory Visit (INDEPENDENT_AMBULATORY_CARE_PROVIDER_SITE_OTHER): Payer: Commercial Managed Care - HMO | Admitting: Family Medicine

## 2014-08-23 ENCOUNTER — Encounter: Payer: Self-pay | Admitting: Family Medicine

## 2014-08-23 VITALS — BP 130/90 | Temp 97.5°F | Wt 136.0 lb

## 2014-08-23 DIAGNOSIS — N3001 Acute cystitis with hematuria: Secondary | ICD-10-CM

## 2014-08-23 DIAGNOSIS — M79605 Pain in left leg: Secondary | ICD-10-CM

## 2014-08-23 DIAGNOSIS — F17200 Nicotine dependence, unspecified, uncomplicated: Secondary | ICD-10-CM

## 2014-08-23 DIAGNOSIS — Z720 Tobacco use: Secondary | ICD-10-CM

## 2014-08-23 DIAGNOSIS — M545 Low back pain, unspecified: Secondary | ICD-10-CM

## 2014-08-23 DIAGNOSIS — M5441 Lumbago with sciatica, right side: Secondary | ICD-10-CM

## 2014-08-23 DIAGNOSIS — M79602 Pain in left arm: Secondary | ICD-10-CM

## 2014-08-23 DIAGNOSIS — R300 Dysuria: Secondary | ICD-10-CM

## 2014-08-23 DIAGNOSIS — N3000 Acute cystitis without hematuria: Secondary | ICD-10-CM | POA: Insufficient documentation

## 2014-08-23 LAB — POCT URINALYSIS DIPSTICK
Bilirubin, UA: NEGATIVE
Glucose, UA: NEGATIVE
Ketones, UA: NEGATIVE
Nitrite, UA: POSITIVE
SPEC GRAV UA: 1.02
Urobilinogen, UA: 0.2
pH, UA: 5.5

## 2014-08-23 MED ORDER — SULFAMETHOXAZOLE-TRIMETHOPRIM 800-160 MG PO TABS: 1.0000 | ORAL_TABLET | Freq: Two times a day (BID) | ORAL | Status: DC

## 2014-08-23 MED ORDER — HYDROCODONE-ACETAMINOPHEN 7.5-325 MG PO TABS: ORAL_TABLET | ORAL | Status: DC

## 2014-08-23 NOTE — Patient Instructions (Signed)
Valium 5 mg........ take 1 tablet 3 times daily  Vicodin........ take 1 tablet 3 times daily  Complete bed rest for the next 48 hours  Return on Thursday for follow-up  Septra DS........ one twice daily for the urinary tract infection

## 2014-08-23 NOTE — Progress Notes (Signed)
   Subjective:    Patient ID: Stacy Garcia, female    DOB: 12/30/1950, 63 y.o.   MRN: [id number]  HPI Stacy Garcia is a 63 year old female who comes in today for evaluation of 3 issues  She's got a history of recurrent low back pain. Last episode was a couple months ago. On 10:00 this past Saturday she experienced a sudden onset of acute pain again with no history of trauma. The pain is sharp, constant, a 10 on a scale of 1-10, and radiates down to her right calf. She's been taken some over-the-counter Motrin and tramadol with no relief.  5 days ago she developed symptoms of urinary tract infection with dysuria. No fever chills or back pain  She continues to smoke  We x-rayed her back last year which shows significant degenerative joint disease throughout her lumbar spine.   Review of Systems    review of systems otherwise negative Objective:   Physical Exam  Well-developed well-nourished female in acute pain  In the supine position her legs reveal equal length. Sensation muscle strength reflexes all within normal limits however positive straight leg raising right at 5 left at 30 radiating to the right.  Urinalysis shows 2+ white cells positive nitrate positive blood      Assessment & Plan:  Recurrent lumbar disc disease with exacerbation of pain,,,,,,,, bedrest,,, medication,,,,, follow-up in 48 hours  Acute cystitis,,,,,,,,,,,,, Septra one twice daily

## 2014-08-23 NOTE — Progress Notes (Signed)
Pre visit review using our clinic review tool, if applicable. No additional management support is needed unless otherwise documented below in the visit note. 

## 2014-08-25 ENCOUNTER — Ambulatory Visit (INDEPENDENT_AMBULATORY_CARE_PROVIDER_SITE_OTHER): Payer: Commercial Managed Care - HMO | Admitting: Family Medicine

## 2014-08-25 ENCOUNTER — Other Ambulatory Visit (INDEPENDENT_AMBULATORY_CARE_PROVIDER_SITE_OTHER): Payer: Commercial Managed Care - HMO | Admitting: Family Medicine

## 2014-08-25 ENCOUNTER — Ambulatory Visit (INDEPENDENT_AMBULATORY_CARE_PROVIDER_SITE_OTHER)
Admission: RE | Admit: 2014-08-25 | Discharge: 2014-08-25 | Disposition: A | Discharge status: Home / Self Care | Payer: Commercial Managed Care - HMO | Source: Ambulatory Visit | Attending: Family Medicine | Admitting: Family Medicine

## 2014-08-25 ENCOUNTER — Encounter: Payer: Self-pay | Admitting: Family Medicine

## 2014-08-25 VITALS — BP 130/80 | Temp 97.8°F | Wt 134.0 lb

## 2014-08-25 DIAGNOSIS — M5441 Lumbago with sciatica, right side: Secondary | ICD-10-CM

## 2014-08-25 NOTE — Patient Instructions (Signed)
Continue your current medication  Lie down and walk,,,,,,,,,, avoid sitting  Go to the main office now for x-rays of your back  We will call you the report this afternoon  We will also get you set up for physical therapy to help relieve the pain

## 2014-08-25 NOTE — Progress Notes (Unsigned)
Pre visit review using our clinic review tool, if applicable. No additional management support is needed unless otherwise documented below in the visit note. 

## 2014-08-25 NOTE — Progress Notes (Signed)
   Subjective:    Patient ID: Stacy Garcia, female    DOB: 1951-05-17, 63 y.o.   MRN: [id number]  HPI Stacy Garcia is a 63 year old female smoker who comes in today for follow-up of back pain. We saw her 2 days ago with severe right-sided back pain radiating down to her right calf. At that time her neurologic exam was normal except she had positive straight leg raising right leg at 0 and left leg it 15. We placed her at bedrest with medication and she comes back today for follow-up. She says overall she feels maybe a little bit better but not much. She also had a urinary tract infection we started her on antibiotics and the urinary tract infection is much improved.   Review of Systems    no neurologic symptoms no weakness numbness etc. Objective:   Physical Exam  Well-developed well-nourished female no acute distress again she smells of tobacco  In the supine position both legs reveal equal length. Sensation muscle strength reflexes all within normal limits. Straight leg raising positive right leg at about 30 left leg about 45      Assessment & Plan:  Urinary tract infection......... improved on antibiotics.....Marland Kitchen continue  Lumbar disc disease with radiation down her right calf without any neurologic deficit........ not improved with bedrest and medication......Marland Kitchen begin workup with x-rays begin physical therapy's neurosurgical consult when necessary

## 2014-08-26 ENCOUNTER — Other Ambulatory Visit: Payer: Self-pay | Admitting: Family Medicine

## 2014-08-26 DIAGNOSIS — M545 Low back pain: Secondary | ICD-10-CM

## 2014-08-31 ENCOUNTER — Ambulatory Visit: Payer: Commercial Managed Care - HMO

## 2014-09-01 ENCOUNTER — Other Ambulatory Visit (INDEPENDENT_AMBULATORY_CARE_PROVIDER_SITE_OTHER): Payer: Commercial Managed Care - HMO

## 2014-09-01 DIAGNOSIS — Z0000 Encounter for general adult medical examination without abnormal findings: Secondary | ICD-10-CM

## 2014-09-01 LAB — POCT URINALYSIS DIPSTICK
Glucose, UA: NEGATIVE
Leukocytes, UA: NEGATIVE
Nitrite, UA: NEGATIVE
PROTEIN UA: NEGATIVE
RBC UA: NEGATIVE
SPEC GRAV UA: 1.02
UROBILINOGEN UA: 0.2
pH, UA: 5.5

## 2014-09-01 LAB — CBC WITH DIFFERENTIAL/PLATELET
Basophils Absolute: 0.1 10*3/uL (ref 0.0–0.1)
Basophils Relative: 0.7 % (ref 0.0–3.0)
Eosinophils Absolute: 0.3 10*3/uL (ref 0.0–0.7)
Eosinophils Relative: 3.8 % (ref 0.0–5.0)
HEMATOCRIT: 47.7 % — AB (ref 36.0–46.0)
Hemoglobin: 15.9 g/dL — ABNORMAL HIGH (ref 12.0–15.0)
LYMPHS PCT: 46.7 % — AB (ref 12.0–46.0)
Lymphs Abs: 3.3 10*3/uL (ref 0.7–4.0)
MCHC: 33.3 g/dL (ref 30.0–36.0)
MCV: 95.8 fl (ref 78.0–100.0)
MONOS PCT: 5.4 % (ref 3.0–12.0)
Monocytes Absolute: 0.4 10*3/uL (ref 0.1–1.0)
NEUTROS ABS: 3.1 10*3/uL (ref 1.4–7.7)
Neutrophils Relative %: 43.4 % (ref 43.0–77.0)
Platelets: 243 10*3/uL (ref 150.0–400.0)
RBC: 4.98 Mil/uL (ref 3.87–5.11)
RDW: 14.4 % (ref 11.5–15.5)
WBC: 7.1 10*3/uL (ref 4.0–10.5)

## 2014-09-01 LAB — BASIC METABOLIC PANEL
BUN: 15 mg/dL (ref 6–23)
CALCIUM: 9.3 mg/dL (ref 8.4–10.5)
CO2: 27 mEq/L (ref 19–32)
Chloride: 103 mEq/L (ref 96–112)
Creatinine, Ser: 0.9 mg/dL (ref 0.4–1.2)
GFR: 67.47 mL/min (ref >60.00)
GLUCOSE: 77 mg/dL (ref 70–99)
Potassium: 4.9 mEq/L (ref 3.5–5.1)
SODIUM: 140 meq/L (ref 135–145)

## 2014-09-01 LAB — LIPID PANEL
CHOL/HDL RATIO: 4
Cholesterol: 201 mg/dL — ABNORMAL HIGH (ref 0–200)
HDL: 44.8 mg/dL (ref >39.00)
NonHDL: 156.2
Triglycerides: 213 mg/dL — ABNORMAL HIGH (ref 0.0–149.0)
VLDL: 42.6 mg/dL — ABNORMAL HIGH (ref 0.0–40.0)

## 2014-09-01 LAB — HEPATIC FUNCTION PANEL
ALBUMIN: 4.2 g/dL (ref 3.5–5.2)
ALK PHOS: 74 U/L (ref 39–117)
ALT: 13 U/L (ref 0–35)
AST: 15 U/L (ref 0–37)
Bilirubin, Direct: 0 mg/dL (ref 0.0–0.3)
TOTAL PROTEIN: 7.1 g/dL (ref 6.0–8.3)
Total Bilirubin: 0.6 mg/dL (ref 0.2–1.2)

## 2014-09-01 LAB — TSH: TSH: 5.26 u[IU]/mL — ABNORMAL HIGH (ref 0.35–4.50)

## 2014-09-01 LAB — LDL CHOLESTEROL, DIRECT: Direct LDL: 118.4 mg/dL

## 2014-09-08 ENCOUNTER — Encounter: Payer: Commercial Managed Care - HMO | Admitting: Family Medicine

## 2014-09-13 ENCOUNTER — Encounter: Payer: Self-pay | Admitting: Family Medicine

## 2014-09-13 ENCOUNTER — Ambulatory Visit (INDEPENDENT_AMBULATORY_CARE_PROVIDER_SITE_OTHER): Payer: Commercial Managed Care - HMO | Admitting: Family Medicine

## 2014-09-13 VITALS — BP 124/80 | Temp 97.6°F | Ht 64.25 in | Wt 136.0 lb

## 2014-09-13 DIAGNOSIS — K279 Peptic ulcer, site unspecified, unspecified as acute or chronic, without hemorrhage or perforation: Secondary | ICD-10-CM | POA: Diagnosis not present

## 2014-09-13 DIAGNOSIS — B9681 Helicobacter pylori [H. pylori] as the cause of diseases classified elsewhere: Secondary | ICD-10-CM

## 2014-09-13 DIAGNOSIS — E034 Atrophy of thyroid (acquired): Secondary | ICD-10-CM | POA: Diagnosis not present

## 2014-09-13 DIAGNOSIS — J441 Chronic obstructive pulmonary disease with (acute) exacerbation: Secondary | ICD-10-CM

## 2014-09-13 DIAGNOSIS — E038 Other specified hypothyroidism: Secondary | ICD-10-CM

## 2014-09-13 DIAGNOSIS — Z720 Tobacco use: Secondary | ICD-10-CM | POA: Diagnosis not present

## 2014-09-13 DIAGNOSIS — F17200 Nicotine dependence, unspecified, uncomplicated: Secondary | ICD-10-CM

## 2014-09-13 DIAGNOSIS — E785 Hyperlipidemia, unspecified: Secondary | ICD-10-CM

## 2014-09-13 MED ORDER — LEVOTHYROXINE SODIUM 88 MCG PO TABS: 88.0000 ug | ORAL_TABLET | Freq: Every day | ORAL | Status: DC

## 2014-09-13 MED ORDER — OMEPRAZOLE 40 MG PO CPDR: 40.0000 mg | DELAYED_RELEASE_CAPSULE | Freq: Every day | ORAL | Status: DC

## 2014-09-13 MED ORDER — SIMVASTATIN 40 MG PO TABS: 40.0000 mg | ORAL_TABLET | Freq: Every day | ORAL | Status: DC

## 2014-09-13 NOTE — Patient Instructions (Signed)
Continue current medications return in one year sooner if any problem

## 2014-09-13 NOTE — Progress Notes (Signed)
Pre visit review using our clinic review tool, if applicable. No additional management support is needed unless otherwise documented below in the visit note. 

## 2014-09-13 NOTE — Progress Notes (Signed)
   Subjective:    Patient ID: Stacy Garcia, female    DOB: 07-15-1951, 64 y.o.   MRN: [id number]  HPI Marnee is a 64 year old female smoker.......Marland Kitchen 1/2-1 pack a day for many years she refuses to try to quit......Marland Kitchen even with the understanding that she has underlying COPD....... who comes in today for general physical examination  She has a history of hypothyroidism and takes Synthroid 88 g daily  She takes Prilosec 40 mg daily for chronic reflux esophagitis which is made work prior chronic smoking  She takes Zocor 40 mg daily and an aspirin tablet for hyperlipidemia.  She has chronic back pain intermittent but she takes Vicodin and Valium. She has severe degenerative disc disease from chronic tobacco abuse but again declines to stop smoking  She gets routine eye care, dental care, and you mammography, colonoscopy 10 years ago was normal. 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   Review of Systems  Constitutional: Negative.   HENT: Negative.   Eyes: Negative.   Respiratory: Negative.   Cardiovascular: Negative.   Gastrointestinal: Negative.   Endocrine: Negative.   Genitourinary: Negative.   Musculoskeletal: Negative.   Skin: Negative.   Allergic/Immunologic: Negative.   Neurological: Negative.   Hematological: Negative.   Psychiatric/Behavioral: Negative.        Objective:   Physical Exam  Constitutional: She is oriented to person, place, and time. She appears well-developed and well-nourished.  HENT:  Head: Normocephalic and atraumatic.  Right Ear: External ear normal.  Left Ear: External ear normal.  Nose: Nose normal.  Mouth/Throat: Oropharynx is clear and moist.  Eyes: EOM are normal. Pupils are equal, round, and reactive to light.  Neck: Normal range of motion. Neck supple. No JVD  present. No tracheal deviation present. No thyromegaly present.  Cardiovascular: Normal rate, regular rhythm, normal heart sounds and intact distal pulses.  Exam reveals no gallop and no friction rub.   No murmur heard. No carotid aortic bruits/////////// history of small AAA  Pulmonary/Chest: Effort normal and breath sounds normal. No stridor. No respiratory distress. She has no wheezes. She has no rales. She exhibits no tenderness.  Symmetrical decrease in breath sounds  Abdominal: Soft. Bowel sounds are normal. She exhibits no distension and no mass. There is no tenderness. There is no rebound and no guarding.  Genitourinary:  Bilateral breast exam normal  Musculoskeletal: Normal range of motion.  Lymphadenopathy:    She has no cervical adenopathy.  Neurological: She is alert and oriented to person, place, and time. She has normal reflexes. No cranial nerve deficit. She exhibits normal muscle tone. Coordination normal.  Skin: Skin is warm and dry. No rash noted. No erythema. No pallor.  Psychiatric: She has a normal mood and affect. Her behavior is normal. Judgment and thought content normal.  Nursing note and vitals reviewed.         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

## 2014-09-22 ENCOUNTER — Other Ambulatory Visit: Payer: Self-pay | Admitting: Radiology

## 2014-09-22 DIAGNOSIS — I714 Abdominal aortic aneurysm, without rupture, unspecified: Secondary | ICD-10-CM

## 2014-09-29 ENCOUNTER — Ambulatory Visit (HOSPITAL_COMMUNITY): Payer: Commercial Managed Care - HMO | Attending: Family Medicine | Admitting: Cardiology

## 2014-09-29 DIAGNOSIS — I714 Abdominal aortic aneurysm, without rupture, unspecified: Secondary | ICD-10-CM

## 2014-09-29 DIAGNOSIS — Z720 Tobacco use: Secondary | ICD-10-CM | POA: Diagnosis not present

## 2014-09-29 DIAGNOSIS — J449 Chronic obstructive pulmonary disease, unspecified: Secondary | ICD-10-CM | POA: Insufficient documentation

## 2014-09-29 NOTE — Progress Notes (Signed)
Abdominal Aorta Duplex performed  

## 2014-10-12 ENCOUNTER — Telehealth: Payer: Self-pay | Admitting: Family Medicine

## 2014-10-12 MED ORDER — SIMVASTATIN 40 MG PO TABS: 40.0000 mg | ORAL_TABLET | Freq: Every day | ORAL | Status: DC

## 2014-10-12 MED ORDER — LEVOTHYROXINE SODIUM 88 MCG PO TABS: 88.0000 ug | ORAL_TABLET | Freq: Every day | ORAL | Status: DC

## 2014-10-12 NOTE — Telephone Encounter (Signed)
Pt request refill of the following: simvastatin (ZOCOR) 40 MG tablet,levothyroxine (SYNTHROID, LEVOTHROID) 88 MCG tablet   Pt need the above  rx sent to the below pharmacy     Phamacy: [facility] mail order

## 2014-10-20 ENCOUNTER — Telehealth: Payer: Self-pay | Admitting: Family Medicine

## 2014-10-20 NOTE — Telephone Encounter (Signed)
Tramadol 50 mg twice daily for 40 days. per Dr Sherren Mocha.  Patient is aware to call [facility] and she does not need Tramadol at this time.

## 2014-10-20 NOTE — Telephone Encounter (Signed)
Pt request refill of the following: HYDROcodone-acetaminophen (NORCO) 7.5-325 MG per tablet   Pt said call her after 4pm    Phamacy:

## 2014-11-13 ENCOUNTER — Other Ambulatory Visit: Payer: Self-pay | Admitting: Family Medicine

## 2014-11-17 ENCOUNTER — Ambulatory Visit (INDEPENDENT_AMBULATORY_CARE_PROVIDER_SITE_OTHER): Payer: Commercial Managed Care - HMO | Admitting: Family Medicine

## 2014-11-17 ENCOUNTER — Encounter: Payer: Self-pay | Admitting: Family Medicine

## 2014-11-17 VITALS — BP 110/80 | Temp 98.6°F | Wt 135.0 lb

## 2014-11-17 DIAGNOSIS — M25511 Pain in right shoulder: Secondary | ICD-10-CM | POA: Diagnosis not present

## 2014-11-17 NOTE — Patient Instructions (Signed)
Call Dr. [name] orthopedist for evaluation ASAP

## 2014-11-17 NOTE — Progress Notes (Signed)
   Subjective:    Patient ID: Stacy Garcia, female    DOB: 06/09/1951, 63 y.o.   MRN: [id number]  HPI Stacy Garcia is a 64 year old female who comes in today for evaluation of shoulder pain  She difficulty her right shoulder for many years of the past couple months is gotten worse and now she cannot hardly pronate or supinate her right shoulder.  Stacy Garcia has operated on her in the back for carpal tunnel syndrome and a bone spur   Review of Systems    review of systems otherwise negative Objective:   Physical Exam  Well-developed well-nourished female no acute distress vital signs stable she's afebrile examination right shoulder shows very marked decreased range of motion consistent with either rotator cuff injury frozen shoulder are both      Assessment & Plan:  Severe right shoulder pain.........Marland Kitchen refer to Stacy Garcia orthopedist for further evaluation.

## 2014-11-18 ENCOUNTER — Encounter: Payer: Self-pay | Admitting: Family Medicine

## 2014-11-28 DIAGNOSIS — M25511 Pain in right shoulder: Secondary | ICD-10-CM | POA: Diagnosis not present

## 2014-11-28 DIAGNOSIS — M7551 Bursitis of right shoulder: Secondary | ICD-10-CM | POA: Diagnosis not present

## 2014-12-09 DIAGNOSIS — M25511 Pain in right shoulder: Secondary | ICD-10-CM | POA: Diagnosis not present

## 2014-12-09 DIAGNOSIS — M71111 Other infective bursitis, right shoulder: Secondary | ICD-10-CM | POA: Diagnosis not present

## 2014-12-21 ENCOUNTER — Other Ambulatory Visit: Payer: Self-pay | Admitting: Orthopaedic Surgery

## 2014-12-21 DIAGNOSIS — M25511 Pain in right shoulder: Secondary | ICD-10-CM

## 2015-01-02 ENCOUNTER — Ambulatory Visit
Admission: RE | Admit: 2015-01-02 | Discharge: 2015-01-02 | Disposition: A | Discharge status: Home / Self Care | Payer: Commercial Managed Care - HMO | Source: Ambulatory Visit | Attending: Orthopaedic Surgery | Admitting: Orthopaedic Surgery

## 2015-01-02 DIAGNOSIS — M7581 Other shoulder lesions, right shoulder: Secondary | ICD-10-CM | POA: Diagnosis not present

## 2015-01-02 DIAGNOSIS — M25411 Effusion, right shoulder: Secondary | ICD-10-CM | POA: Diagnosis not present

## 2015-01-02 DIAGNOSIS — M24111 Other articular cartilage disorders, right shoulder: Secondary | ICD-10-CM | POA: Diagnosis not present

## 2015-01-02 DIAGNOSIS — M25511 Pain in right shoulder: Secondary | ICD-10-CM

## 2015-01-02 DIAGNOSIS — M19011 Primary osteoarthritis, right shoulder: Secondary | ICD-10-CM | POA: Diagnosis not present

## 2015-01-05 DIAGNOSIS — M7551 Bursitis of right shoulder: Secondary | ICD-10-CM | POA: Diagnosis not present

## 2015-01-05 DIAGNOSIS — M25511 Pain in right shoulder: Secondary | ICD-10-CM | POA: Diagnosis not present

## 2015-01-05 DIAGNOSIS — M19011 Primary osteoarthritis, right shoulder: Secondary | ICD-10-CM | POA: Diagnosis not present

## 2015-01-14 ENCOUNTER — Other Ambulatory Visit: Payer: Self-pay | Admitting: Family Medicine

## 2015-01-25 ENCOUNTER — Other Ambulatory Visit: Payer: Self-pay | Admitting: Family Medicine

## 2015-02-08 HISTORY — PX: SHOULDER SURGERY: SHX246

## 2015-02-09 DIAGNOSIS — M19011 Primary osteoarthritis, right shoulder: Secondary | ICD-10-CM | POA: Diagnosis not present

## 2015-02-09 DIAGNOSIS — M94211 Chondromalacia, right shoulder: Secondary | ICD-10-CM | POA: Diagnosis not present

## 2015-02-09 DIAGNOSIS — M7541 Impingement syndrome of right shoulder: Secondary | ICD-10-CM | POA: Diagnosis not present

## 2015-02-09 DIAGNOSIS — M659 Synovitis and tenosynovitis, unspecified: Secondary | ICD-10-CM | POA: Diagnosis not present

## 2015-02-09 DIAGNOSIS — M75111 Incomplete rotator cuff tear or rupture of right shoulder, not specified as traumatic: Secondary | ICD-10-CM | POA: Diagnosis not present

## 2015-02-09 DIAGNOSIS — M24111 Other articular cartilage disorders, right shoulder: Secondary | ICD-10-CM | POA: Diagnosis not present

## 2015-02-09 DIAGNOSIS — S42111D Displaced fracture of body of scapula, right shoulder, subsequent encounter for fracture with routine healing: Secondary | ICD-10-CM | POA: Diagnosis not present

## 2015-02-09 DIAGNOSIS — G8918 Other acute postprocedural pain: Secondary | ICD-10-CM | POA: Diagnosis not present

## 2015-02-22 ENCOUNTER — Ambulatory Visit: Payer: Commercial Managed Care - HMO | Attending: Orthopaedic Surgery | Admitting: Physical Therapy

## 2015-02-22 DIAGNOSIS — M25511 Pain in right shoulder: Secondary | ICD-10-CM | POA: Insufficient documentation

## 2015-02-22 DIAGNOSIS — R29898 Other symptoms and signs involving the musculoskeletal system: Secondary | ICD-10-CM | POA: Diagnosis not present

## 2015-02-22 DIAGNOSIS — M25611 Stiffness of right shoulder, not elsewhere classified: Secondary | ICD-10-CM | POA: Diagnosis not present

## 2015-02-22 DIAGNOSIS — R293 Abnormal posture: Secondary | ICD-10-CM | POA: Insufficient documentation

## 2015-02-22 NOTE — Therapy (Addendum)
[place], [place], [address] Phone: [phone]   Fax:  [phone]  Physical Therapy Evaluation  Patient Details  Name: Stacy Garcia MRN: [id number] Date of Birth: 1951/02/28 Referring Provider:  Garald Balding, MD  Encounter Date: 02/22/2015      PT End of Session - 02/22/15 0943    Visit Number 1   Number of Visits 12   Date for PT Re-Evaluation 04/19/15   Authorization Type Medicare   PT Start Time 0845   PT Stop Time 0930   PT Time Calculation (min) 45 min   Activity Tolerance Patient tolerated treatment well;Patient limited by pain   Behavior During Therapy [facility] for tasks assessed/performed      Past Medical History  Diagnosis Date  . Hyperlipidemia   . Tobacco abuse   . Vertigo   . Thyroid disease   . COPD (chronic obstructive pulmonary disease)     No past surgical history on file.  There were no vitals filed for this visit.  Visit Diagnosis:  Right shoulder pain - Plan: PT plan of care cert/re-cert  Shoulder stiffness, right - Plan: PT plan of care cert/re-cert  Weakness of right arm - Plan: PT plan of care cert/re-cert  Posture abnormality - Plan: PT plan of care cert/re-cert  Decreased ROM of right shoulder - Plan: PT plan of care cert/re-cert      Subjective Assessment - 02/22/15 0851    Subjective pt is a 64 y.o F with CC or R shoulder pain with arthroscopic surgery on 02/09/2015, she reports that she still has some pain since the surgery. she reports that it feels heavy   Limitations House hold activities;Lifting   How long can you sit comfortably? unlimited   How long can you stand comfortably? unlimited   How long can you walk comfortably? unlimited   Diagnostic tests  01/03/2015 MRI significant OA   Patient Stated Goals to get the shoulder stronger and get rid of the catching.    Currently in Pain? Yes   Pain Score 5    Pain Location Shoulder   Pain Orientation  Right   Pain Descriptors / Indicators Sore;Aching   Pain Type Surgical pain   Pain Onset More than a month ago   Pain Frequency Intermittent   Aggravating Factors  sleeping, overusing her shoulder   Pain Relieving Factors ice, rest, rubbing it,             [facility] PT Assessment - 02/22/15 0856    Assessment   Medical Diagnosis R shoulder pain s/p arthroscopy   Onset Date/Surgical Date 02/09/15   Hand Dominance Right   Next MD Visit --  6/272016   Prior Therapy no   Precautions   Precautions None   Restrictions   Weight Bearing Restrictions No   Balance Screen   Has the patient fallen in the past 6 months No   Has the patient had a decrease in activity level because of a fear of falling?  No   Is the patient reluctant to leave their home because of a fear of falling?  No   Home Environment   Living Environment Private residence   Living Arrangements Spouse/significant other   Available Help at Discharge Available PRN/intermittently;Available 24 hours/day   Type of Home House   Home Access Stairs to enter   Entrance Stairs-Number of Steps 5   Home Layout Two level   Alternate Level Stairs-Number of Steps 5  Prior Function   Level of Independence Independent;Independent with basic ADLs;Independent with household mobility without device;Independent with community mobility without device;Independent with homemaking with ambulation;Independent with gait;Independent with transfers   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   Vocation Requirements pushing, pulling, lifting, reaching, dragging   Cognition   Overall Cognitive Status Within Functional Limits for tasks assessed   Observation/Other Assessments   Focus on Therapeutic Outcomes (FOTO)  52% limited  predicted 29% limited   Posture/Postural Control   Posture/Postural Control Postural limitations   Postural Limitations Rounded Shoulders;Forward head   ROM / Strength   AROM / PROM / Strength  AROM;PROM;Strength   AROM   AROM Assessment Site Shoulder   Right/Left Shoulder Right;Left   Right Shoulder Extension 60 Degrees  pain and tightness during testing   Right Shoulder Flexion 160 Degrees   Right Shoulder ABduction 118 Degrees  pain during movement   Right Shoulder Internal Rotation 110 Degrees   Right Shoulder External Rotation 80 Degrees   Left Shoulder Extension 90 Degrees   Left Shoulder Flexion 160 Degrees   Left Shoulder ABduction 155 Degrees   Left Shoulder Internal Rotation 110 Degrees   Left Shoulder External Rotation 108 Degrees   PROM   PROM Assessment Site Shoulder   Right/Left Shoulder Right;Left   Right Shoulder ABduction 100 Degrees  with pain at endrange   Strength   Strength Assessment Site Shoulder   Right/Left Shoulder Right;Left   Right Shoulder Flexion 4-/5   Right Shoulder Extension 4+/5   Right Shoulder ABduction 3/5   Right Shoulder Internal Rotation 4/5   Right Shoulder External Rotation 4-/5   Left Shoulder Flexion 5/5   Left Shoulder Extension 5/5   Left Shoulder ABduction 5/5   Left Shoulder Internal Rotation 5/5   Left Shoulder External Rotation 5/5   Left Shoulder Horizontal ABduction 5/5   Left Shoulder Horizontal ADduction 5/5   Palpation   Palpation comment tenderness noted at the [facility] joint and belly of the supraspinatus and at the lateral deltoid.                    [facility] Adult PT Treatment/Exercise - 02/22/15 0856    Shoulder Exercises: Standing   External Rotation AROM;Strengthening;Right;10 reps;Theraband   Theraband Level (Shoulder External Rotation) Level 2 (Red)   Internal Rotation AROM;Strengthening;Right;10 reps;Theraband   Theraband Level (Shoulder Internal Rotation) Level 2 (Red)   Flexion AROM;Strengthening;Right;10 reps  scaption no weight   Row AROM;Strengthening;Right;10 reps;Theraband   Theraband Level (Shoulder Row) Level 2 (Red)   Other Standing Exercises wall push ups x 10  with a plus    Shoulder Exercises: ROM/Strengthening   Other ROM/Strengthening Exercises wand abduction x 10                PT Education - 02/22/15 0942    Education provided Yes   Education Details evaluation findings, POC, goals, anatomical explanation   Person(s) Educated Patient   Methods Explanation   Comprehension Verbalized understanding          PT Short Term Goals - 02/22/15 0952    PT SHORT TERM GOAL #1   Title pt will be I with basic HEP (03/24/2015)   Time 4   Period Weeks   Status New   PT SHORT TERM GOAL #2   Title She will decreased pain to < 4/10 during and following lifting/pushing and pulling weight > 3 # to assist with exercise progression (03/24/2015)   Time 4  Period Weeks   Status New   PT SHORT TERM GOAL #3   Title she will increase shoulder abduction by> 10 degrees  to assist with ADLS (1/69/6789)   Time 4   Period Weeks   Status New   PT SHORT TERM GOAL #4   Title She will increase her FOTO score by > 10 points to assist with increase functional capactiy (03/24/2015)   Time 4   Period Weeks   Status New           PT Long Term Goals - 2015/02/23 0954    PT LONG TERM GOAL #1   Title She will be I upon discharge with all HEP given throughout PT (04/24/2015)   Time 8   Period Weeks   Status New   PT LONG TERM GOAL #2   Title She will demonstrate R shoulder overall AROM WFL compared Bil to assist with work related tasks and ADLs (04/24/2015)   Time 8   Period Weeks   Status New   PT LONG TERM GOAL #3   Title She will increase overal shoulder strength to >4+5 to assist with pushing, pulling, and carrying activities 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 She demonstrates Functional AROM with flexion and IR/ER but abduction is limited secondary to pain, as well horizontal adduction. the surgical incisions sites appear intact and healing well. palpaiton relveals tenderness along the lateral deltoid and belly of the supraspinatus with referral of pain to the lateral mid humerus. She demonstrates weakness in the R shoulder compared bil with pain during testing. She would benefit from skilled physical therapy to decrease pain and maximize funciton by addressing the impairments listed.    Pt will benefit from skilled therapeutic intervention in order to improve on the following deficits Decreased mobility;Decreased strength;Postural dysfunction;Pain;Decreased range of motion;Decreased activity tolerance;Decreased endurance;Hypomobility;Impaired UE functional use;Increased fascial restricitons   Rehab Potential Good   PT Frequency 1x / week   PT Duration 12 weeks   PT Treatment/Interventions ADLs/Self Care Home Management;Cryotherapy;Electrical Stimulation;Moist Heat;Ultrasound;Neuromuscular re-education;Therapeutic exercise;Therapeutic activities;Patient/family education;Manual techniques;Taping;Dry needling;Passive range of motion   PT Next Visit Plan assess resonse and review HEP, shoulder mobility, scapular stability, and shoulder strengthening, modalities PRN   PT Home Exercise Plan SEE HEP handout   Consulted and Agree with Plan of Care Patient          G-Codes - 02/23/2015 0959    Functional Limitation Carrying, moving and handling objects   Carrying, Moving and Handling Objects Current Status (F8101) At least 40 percent but less  than 60 percent impaired, limited or restricted   Carrying, Moving and Handling Objects Goal Status (B5102) At least 20 percent but less  than 40 percent impaired, limited or restricted       Problem List Patient Active Problem List   Diagnosis Date Noted  . Acute cystitis 08/23/2014  . Peptic ulcer associated with Helicobacter pylori infection 05/03/2014  . Acute right hip pain 04/28/2014  . Pain of left leg 04/28/2014  . Abdominal pain, epigastric 04/28/2014  . AAA (abdominal aortic aneurysm) without rupture 03/08/2013  . Left otitis media 10/08/2012  . Unilateral occipital headache 04/20/2012  . Vaginitis 02/24/2012  . Left knee pain 11/19/2010  . SHOULDER PAIN, BILATERAL 07/17/2010  . WRIST PAIN, RIGHT 02/15/2010  . Hypothyroidism 08/01/2008  . COPD exacerbation 08/01/2008  . Backache 11/26/2007  . HYPOTHYROIDISM, POST-RADIOACTIVE IODINE 07/20/2007  . TOBACCO ABUSE 07/20/2007  . HOT FLASHES 07/20/2007  . BENIGN POSITIONAL VERTIGO, HX OF 07/20/2007  . INFLAMMATORY DISEASE OF BREAST 07/19/2007  . FIBROCYSTIC BREAST DISEASE 04/01/2007   Starr Lake PT, DPT, LAT, ATC  02/22/2015  10:06 AM   [facility] [address] [place], [place], [address] Phone: [phone]   Fax:  [phone]                 PHYSICAL THERAPY DISCHARGE SUMMARY  Visits from Start of Care: 1  Current functional level related to goals / functional outcomes: FOTO  52%    Remaining deficits: See goals   Education / Equipment: HEP handout  Plan: Patient agrees to discharge.  Patient goals were not met. Patient is being discharged due to the patient's request.  ?????  Pt called and cancelled all appointments.           Naaman Curro PT, DPT, LAT, ATC  03/17/2015  12:18 PM

## 2015-02-22 NOTE — Patient Instructions (Signed)
   Syed Zukas PT, DPT, LAT, ATC  [facility] Phone: [phone]     

## 2015-03-15 ENCOUNTER — Ambulatory Visit: Payer: Commercial Managed Care - HMO | Admitting: Physical Therapy

## 2015-03-20 ENCOUNTER — Ambulatory Visit: Payer: Commercial Managed Care - HMO | Admitting: Physical Therapy

## 2015-03-24 ENCOUNTER — Other Ambulatory Visit: Payer: Self-pay | Admitting: Family Medicine

## 2015-03-24 DIAGNOSIS — I714 Abdominal aortic aneurysm, without rupture, unspecified: Secondary | ICD-10-CM

## 2015-03-27 ENCOUNTER — Ambulatory Visit: Payer: Commercial Managed Care - HMO | Admitting: Physical Therapy

## 2015-04-03 ENCOUNTER — Ambulatory Visit: Payer: Commercial Managed Care - HMO | Admitting: Physical Therapy

## 2015-04-04 ENCOUNTER — Ambulatory Visit (HOSPITAL_COMMUNITY): Payer: Commercial Managed Care - HMO | Attending: Cardiovascular Disease

## 2015-04-04 DIAGNOSIS — I714 Abdominal aortic aneurysm, without rupture, unspecified: Secondary | ICD-10-CM

## 2015-04-10 ENCOUNTER — Encounter: Payer: Commercial Managed Care - HMO | Admitting: Physical Therapy

## 2015-04-21 DIAGNOSIS — M1711 Unilateral primary osteoarthritis, right knee: Secondary | ICD-10-CM | POA: Diagnosis not present

## 2015-04-28 DIAGNOSIS — M19011 Primary osteoarthritis, right shoulder: Secondary | ICD-10-CM | POA: Diagnosis not present

## 2015-05-22 DIAGNOSIS — M1711 Unilateral primary osteoarthritis, right knee: Secondary | ICD-10-CM | POA: Diagnosis not present

## 2015-05-23 ENCOUNTER — Ambulatory Visit (INDEPENDENT_AMBULATORY_CARE_PROVIDER_SITE_OTHER): Payer: Medicare HMO | Admitting: Adult Health

## 2015-05-23 ENCOUNTER — Encounter: Payer: Self-pay | Admitting: Adult Health

## 2015-05-23 VITALS — BP 130/80 | HR 85 | Temp 98.0°F | Ht 64.25 in | Wt 136.0 lb

## 2015-05-23 DIAGNOSIS — J209 Acute bronchitis, unspecified: Secondary | ICD-10-CM | POA: Diagnosis not present

## 2015-05-23 MED ORDER — HYDROCODONE-HOMATROPINE 5-1.5 MG/5ML PO SYRP: 5.0000 mL | ORAL_SOLUTION | Freq: Three times a day (TID) | ORAL | Status: DC | PRN

## 2015-05-23 MED ORDER — PREDNISONE 20 MG PO TABS: 20.0000 mg | ORAL_TABLET | Freq: Every day | ORAL | Status: DC

## 2015-05-23 MED ORDER — DOXYCYCLINE HYCLATE 100 MG PO CAPS: 100.0000 mg | ORAL_CAPSULE | Freq: Two times a day (BID) | ORAL | Status: DC

## 2015-05-23 NOTE — Patient Instructions (Signed)
It was great meeting you today!  Take the doxycycline twice a day for 7 days  Take the cough syrup at night   Prednisone as directed: Day 1 40 mg Day 2 40 mg Day 3 40 mg Day 4 20 mg Day 5 20 mg Day 6 20 mg  Follow up if no improvement.

## 2015-05-23 NOTE — Progress Notes (Signed)
Pre visit review using our clinic review tool, if applicable. No additional management support is needed unless otherwise documented below in the visit note. 

## 2015-05-23 NOTE — Progress Notes (Signed)
   Subjective:    Patient ID: Stacy Garcia, female    DOB: 18-Jan-1951, 64 y.o.   MRN: [id number]  HPI 64 year old female, patient of Dr. Sherren Mocha who presents to the office today for cough that started on Saturday. She endorses it is a productive cough with yellow/green phlegm. The cough is keeping her up at night. She had a sore throat on Thursday which has since resolved.   Denies any fevers, n/v/d or sick contacts.    Review of Systems  Constitutional: Negative.   HENT: Positive for congestion. Negative for ear discharge, ear pain, postnasal drip, rhinorrhea, sinus pressure, sore throat and trouble swallowing.   Respiratory: Positive for cough and wheezing. Negative for shortness of breath.   Cardiovascular: Negative.   Musculoskeletal: Negative.   Skin: Negative.   Neurological: Negative.   Psychiatric/Behavioral: Positive for sleep disturbance.  All other systems reviewed and are negative.      Objective:   Physical Exam  Constitutional: She is oriented to person, place, and time. She appears well-developed and well-nourished. No distress.  HENT:  Head: Normocephalic and atraumatic.  Right Ear: External ear normal.  Left Ear: External ear normal.  Nose: Nose normal.  Mouth/Throat: Oropharynx is clear and moist. No oropharyngeal exudate.  Eyes: Conjunctivae and EOM are normal. Pupils are equal, round, and reactive to light. Right eye exhibits no discharge. Left eye exhibits no discharge.  Cardiovascular: Normal rate, regular rhythm, normal heart sounds and intact distal pulses.  Exam reveals no gallop and no friction rub.   No murmur heard. Pulmonary/Chest: Effort normal and breath sounds normal. No respiratory distress. She has no wheezes. She has no rales. She exhibits no tenderness.  Abdominal: Soft. Bowel sounds are normal.  Lymphadenopathy:    She has no cervical adenopathy.  Neurological: She is alert and oriented to person, place, and time. She has normal reflexes.    Skin: Skin is warm and dry. No rash noted. She is not diaphoretic. No erythema. No pallor.  Psychiatric: She has a normal mood and affect. Her behavior is normal. Judgment and thought content normal.  Nursing note and vitals reviewed.      Assessment & Plan:  1. Acute bronchitis, unspecified organism - She has a history of COPD and smokes - HYDROcodone-homatropine (HYCODAN) 5-1.5 MG/5ML syrup; Take 5 mLs by mouth every 8 (eight) hours as needed for cough.  Dispense: 120 mL; Refill: 0 - doxycycline (VIBRAMYCIN) 100 MG capsule; Take 1 capsule (100 mg total) by mouth 2 (two) times daily.  Dispense: 14 capsule; Refill: 0 - predniSONE (DELTASONE) 20 MG tablet; Take 1 tablet (20 mg total) by mouth daily with breakfast.  Dispense: 9 tablet; Refill: 0 - Needs to quit smoking - Follow up if no improvement in 2-3 days or if symptoms worsen

## 2015-05-30 ENCOUNTER — Other Ambulatory Visit: Payer: Self-pay | Admitting: Family Medicine

## 2015-06-20 DIAGNOSIS — M1711 Unilateral primary osteoarthritis, right knee: Secondary | ICD-10-CM | POA: Diagnosis not present

## 2015-07-05 DIAGNOSIS — M1711 Unilateral primary osteoarthritis, right knee: Secondary | ICD-10-CM | POA: Diagnosis not present

## 2015-07-12 DIAGNOSIS — M1711 Unilateral primary osteoarthritis, right knee: Secondary | ICD-10-CM | POA: Diagnosis not present

## 2015-07-18 ENCOUNTER — Other Ambulatory Visit: Payer: Self-pay | Admitting: Family Medicine

## 2015-07-19 DIAGNOSIS — M1711 Unilateral primary osteoarthritis, right knee: Secondary | ICD-10-CM | POA: Diagnosis not present

## 2015-07-21 NOTE — Telephone Encounter (Signed)
[facility] calling to get a refill on pt's Tramadol. Called on the [place] and on the 10th.

## 2015-07-27 ENCOUNTER — Ambulatory Visit: Payer: Self-pay | Admitting: Adult Health

## 2015-08-18 DIAGNOSIS — M1711 Unilateral primary osteoarthritis, right knee: Secondary | ICD-10-CM | POA: Diagnosis not present

## 2015-09-07 DIAGNOSIS — Z1231 Encounter for screening mammogram for malignant neoplasm of breast: Secondary | ICD-10-CM | POA: Diagnosis not present

## 2015-09-07 LAB — HM MAMMOGRAPHY

## 2015-09-08 ENCOUNTER — Encounter: Payer: Self-pay | Admitting: Family Medicine

## 2015-09-12 ENCOUNTER — Other Ambulatory Visit: Payer: Self-pay | Admitting: Family Medicine

## 2015-09-12 MED ORDER — OMEPRAZOLE 40 MG PO CPDR: 40.0000 mg | DELAYED_RELEASE_CAPSULE | Freq: Every day | ORAL | Status: DC

## 2015-09-14 DIAGNOSIS — M25561 Pain in right knee: Secondary | ICD-10-CM | POA: Diagnosis not present

## 2015-09-14 DIAGNOSIS — M1711 Unilateral primary osteoarthritis, right knee: Secondary | ICD-10-CM | POA: Diagnosis not present

## 2015-09-25 ENCOUNTER — Other Ambulatory Visit: Payer: Self-pay | Admitting: Family Medicine

## 2015-09-27 ENCOUNTER — Telehealth: Payer: Self-pay | Admitting: Family Medicine

## 2015-09-27 MED ORDER — LEVOTHYROXINE SODIUM 88 MCG PO TABS: 88.0000 ug | ORAL_TABLET | Freq: Every day | ORAL | Status: DC

## 2015-09-27 MED ORDER — SIMVASTATIN 40 MG PO TABS: 40.0000 mg | ORAL_TABLET | Freq: Every day | ORAL | Status: DC

## 2015-09-27 NOTE — Telephone Encounter (Signed)
Rx sent to pharmacy   

## 2015-09-27 NOTE — Telephone Encounter (Signed)
Pt made appt for cpe first available June 2017. However pt needs refill  levothyroxine (SYNTHROID, LEVOTHROID) 88 MCG tablet  And only has enough simvastatin (ZOCOR) 40 MG tablet To last until  march 2017.   [facility] order pharmacy

## 2015-10-02 ENCOUNTER — Other Ambulatory Visit: Payer: Self-pay | Admitting: Family Medicine

## 2015-10-02 DIAGNOSIS — I714 Abdominal aortic aneurysm, without rupture, unspecified: Secondary | ICD-10-CM

## 2015-10-09 ENCOUNTER — Ambulatory Visit (HOSPITAL_COMMUNITY)
Admission: RE | Admit: 2015-10-09 | Discharge: 2015-10-09 | Disposition: A | Discharge status: Home / Self Care | Payer: Commercial Managed Care - HMO | Source: Ambulatory Visit | Attending: Cardiology | Admitting: Cardiology

## 2015-10-09 DIAGNOSIS — F17200 Nicotine dependence, unspecified, uncomplicated: Secondary | ICD-10-CM | POA: Diagnosis not present

## 2015-10-09 DIAGNOSIS — I700 Atherosclerosis of aorta: Secondary | ICD-10-CM | POA: Diagnosis not present

## 2015-10-09 DIAGNOSIS — E785 Hyperlipidemia, unspecified: Secondary | ICD-10-CM | POA: Insufficient documentation

## 2015-10-09 DIAGNOSIS — I714 Abdominal aortic aneurysm, without rupture, unspecified: Secondary | ICD-10-CM

## 2015-10-16 ENCOUNTER — Ambulatory Visit (INDEPENDENT_AMBULATORY_CARE_PROVIDER_SITE_OTHER): Payer: Commercial Managed Care - HMO | Admitting: Family Medicine

## 2015-10-16 ENCOUNTER — Encounter: Payer: Self-pay | Admitting: Family Medicine

## 2015-10-16 ENCOUNTER — Ambulatory Visit (INDEPENDENT_AMBULATORY_CARE_PROVIDER_SITE_OTHER)
Admission: RE | Admit: 2015-10-16 | Discharge: 2015-10-16 | Disposition: A | Discharge status: Home / Self Care | Payer: Commercial Managed Care - HMO | Source: Ambulatory Visit | Attending: Family Medicine | Admitting: Family Medicine

## 2015-10-16 VITALS — BP 120/80 | Temp 97.9°F | Wt 138.0 lb

## 2015-10-16 DIAGNOSIS — J441 Chronic obstructive pulmonary disease with (acute) exacerbation: Secondary | ICD-10-CM

## 2015-10-16 DIAGNOSIS — F17200 Nicotine dependence, unspecified, uncomplicated: Secondary | ICD-10-CM | POA: Diagnosis not present

## 2015-10-16 DIAGNOSIS — Z01818 Encounter for other preprocedural examination: Secondary | ICD-10-CM

## 2015-10-16 NOTE — Progress Notes (Signed)
   Subjective:    Patient ID: Stacy Garcia, female    DOB: [birth year], 65 y.o.   MRN: [id number]  HPI Stacy Garcia is a 65 year old married female smoker,,,,,,,,, 10 cigarettes a day,,,,,, who has underlying COPD from the chronic tobacco abuse who comes in today for preop evaluation. She's can have a TKR right by Dr. Durward Fortes  We discussed her overall health. Her biggest issue is a chronic tobacco abuse along with the underlying COPD. I recommend that she stop smoking completely. She tried the Chantix in the past but it gave her bad dreams. I reassured her the bad dreams or worsen dying of lung disease  We will restart the Chantix it 0.5 mg Monday Wednesday Friday have her stop smoking completely and get some pulmonary function studies to assess her pulmonary status prior to anesthesia. Also she might be a candidate for spinal instead of general. I'll leave that up to Dr. Durward Fortes   Review of Systems Review of systems otherwise negative    Objective:   Physical Exam  Well-developed thin female no acute distress vital signs stable she's afebrile cardiac exam normal as was her EKG except for very very distant heart sounds because of underlying COPD. Lungs showed symmetrical breath sounds were markedly diminished      Assessment & Plan:  COPD with ongoing tobacco abuse with impending right TKR.........Marland Kitchen restart Chantix 0.5 Monday Wednesday Friday........ stop smoking completely......... chest x-ray.... And PFTs

## 2015-10-16 NOTE — Progress Notes (Signed)
Pre visit review using our clinic review tool, if applicable. No additional management support is needed unless otherwise documented below in the visit note. 

## 2015-10-16 NOTE — Patient Instructions (Signed)
Chest x-ray today at the main office  We'll get you set up for pulmonary function studies ASAP  Chantix 0.5 mg,,,,,,,, 1 Monday Wednesday Friday  Stop smoking completely  In one week

## 2015-10-23 ENCOUNTER — Ambulatory Visit (INDEPENDENT_AMBULATORY_CARE_PROVIDER_SITE_OTHER): Payer: Commercial Managed Care - HMO | Admitting: Internal Medicine

## 2015-10-23 ENCOUNTER — Other Ambulatory Visit: Payer: Self-pay | Admitting: Family Medicine

## 2015-10-23 DIAGNOSIS — Z720 Tobacco use: Secondary | ICD-10-CM

## 2015-10-23 DIAGNOSIS — J441 Chronic obstructive pulmonary disease with (acute) exacerbation: Secondary | ICD-10-CM

## 2015-10-23 DIAGNOSIS — F17200 Nicotine dependence, unspecified, uncomplicated: Secondary | ICD-10-CM

## 2015-10-23 DIAGNOSIS — R942 Abnormal results of pulmonary function studies: Secondary | ICD-10-CM

## 2015-10-23 LAB — PULMONARY FUNCTION TEST
DL/VA % pred: 76 %
DL/VA: 3.78 ml/min/mmHg/L
DLCO UNC: 13.49 ml/min/mmHg
DLCO cor % pred: 52 %
DLCO cor: 13.45 ml/min/mmHg
DLCO unc % pred: 52 %
FEF 25-75 Post: 0.68 L/sec
FEF 25-75 Pre: 0.46 L/sec
FEF2575-%Change-Post: 47 %
FEF2575-%Pred-Post: 32 %
FEF2575-%Pred-Pre: 21 %
FEV1-%CHANGE-POST: 11 %
FEV1-%PRED-POST: 50 %
FEV1-%PRED-PRE: 45 %
FEV1-POST: 1.25 L
FEV1-PRE: 1.12 L
FEV1FVC-%Change-Post: 12 %
FEV1FVC-%PRED-PRE: 79 %
FEV6-%Change-Post: 0 %
FEV6-%PRED-POST: 57 %
FEV6-%PRED-PRE: 58 %
FEV6-POST: 1.78 L
FEV6-Pre: 1.79 L
FEV6FVC-%CHANGE-POST: 0 %
FEV6FVC-%PRED-POST: 102 %
FEV6FVC-%Pred-Pre: 102 %
FVC-%Change-Post: 0 %
FVC-%PRED-PRE: 56 %
FVC-%Pred-Post: 56 %
FVC-POST: 1.81 L
FVC-Pre: 1.82 L
POST FEV6/FVC RATIO: 98 %
PRE FEV1/FVC RATIO: 62 %
PRE FEV6/FVC RATIO: 99 %
Post FEV1/FVC ratio: 69 %
RV % pred: 123 %
RV: 2.7 L
TLC % PRED: 87 %
TLC: 4.54 L

## 2015-10-23 NOTE — Progress Notes (Signed)
PFT done today. 

## 2015-10-24 ENCOUNTER — Ambulatory Visit: Payer: Commercial Managed Care - HMO | Admitting: Family Medicine

## 2015-10-24 ENCOUNTER — Institutional Professional Consult (permissible substitution): Payer: Commercial Managed Care - HMO | Admitting: Internal Medicine

## 2015-10-26 ENCOUNTER — Other Ambulatory Visit: Payer: Self-pay | Admitting: Family Medicine

## 2015-10-26 ENCOUNTER — Ambulatory Visit (INDEPENDENT_AMBULATORY_CARE_PROVIDER_SITE_OTHER): Payer: Commercial Managed Care - HMO | Admitting: Pulmonary Disease

## 2015-10-26 ENCOUNTER — Encounter: Payer: Self-pay | Admitting: Pulmonary Disease

## 2015-10-26 VITALS — BP 112/76 | HR 83 | Ht 65.0 in | Wt 136.4 lb

## 2015-10-26 DIAGNOSIS — Z01811 Encounter for preprocedural respiratory examination: Secondary | ICD-10-CM | POA: Diagnosis not present

## 2015-10-26 DIAGNOSIS — F17210 Nicotine dependence, cigarettes, uncomplicated: Secondary | ICD-10-CM | POA: Diagnosis not present

## 2015-10-26 DIAGNOSIS — Z720 Tobacco use: Secondary | ICD-10-CM

## 2015-10-26 DIAGNOSIS — J439 Emphysema, unspecified: Secondary | ICD-10-CM

## 2015-10-26 DIAGNOSIS — Z23 Encounter for immunization: Secondary | ICD-10-CM | POA: Diagnosis not present

## 2015-10-26 DIAGNOSIS — R942 Abnormal results of pulmonary function studies: Secondary | ICD-10-CM

## 2015-10-26 MED ORDER — ALBUTEROL SULFATE HFA 108 (90 BASE) MCG/ACT IN AERS: 2.0000 | INHALATION_SPRAY | Freq: Four times a day (QID) | RESPIRATORY_TRACT | Status: DC | PRN

## 2015-10-26 NOTE — Progress Notes (Signed)
   Subjective:    Patient ID: Stacy Garcia, female    DOB: 03-20-1951, 65 y.o.   MRN: [id number]  HPI    Review of Systems  Constitutional: Negative for fever and unexpected weight change.  HENT: Negative for congestion, dental problem, ear pain, nosebleeds, postnasal drip, rhinorrhea, sinus pressure, sneezing, sore throat and trouble swallowing.   Eyes: Negative for redness and itching.  Respiratory: Positive for wheezing. Negative for cough, chest tightness and shortness of breath.   Cardiovascular: Negative for palpitations and leg swelling.  Gastrointestinal: Negative for nausea and vomiting.  Genitourinary: Negative for dysuria.  Musculoskeletal: Negative for joint swelling.  Skin: Negative for rash.  Neurological: Negative for headaches.  Hematological: Does not bruise/bleed easily.  Psychiatric/Behavioral: Negative for dysphoric mood. The patient is not nervous/anxious.        Objective:   Physical Exam        Assessment & Plan:

## 2015-10-26 NOTE — Patient Instructions (Signed)
Ventolin 2 puffs every 4 to 6 hours as needed for cough, wheezing, or chest congestion  Flu shot today Pneumovax today  Follow up in 4 months

## 2015-10-26 NOTE — Progress Notes (Signed)
Past medical history She  has a past medical history of Hyperlipidemia; Tobacco abuse; Vertigo; Thyroid disease; and COPD (chronic obstructive pulmonary disease) ([place]).  Past surgical history She  has past surgical history that includes Cesarean section and Shoulder surgery (02/2015).  Family history Her family history includes Diabetes in her other; Hyperlipidemia in her other; Hypertension in her other; Thyroid disease in her other.  Social history She  reports that she has been smoking Cigarettes.  She has a 50 pack-year smoking history. She does not have any smokeless tobacco history on file. She reports that she does not drink alcohol or use illicit drugs.  No Known Allergies  Current Outpatient Prescriptions on File Prior to Visit  Medication Sig  . aspirin 325 MG tablet Take 325 mg by mouth daily.    . diazepam (VALIUM) 5 MG tablet TAKE 1/2 TO 1 TABLET BY MOUTH TWICE A DAY  . ibuprofen (ADVIL,MOTRIN) 200 MG tablet Take 400 mg by mouth every 6 (six) hours as needed. For pain  . levothyroxine (SYNTHROID, LEVOTHROID) 88 MCG tablet Take 1 tablet (88 mcg total) by mouth daily.  Marland Kitchen omeprazole (PRILOSEC) 40 MG capsule Take 1 capsule (40 mg total) by mouth daily.  . simvastatin (ZOCOR) 40 MG tablet Take 1 tablet (40 mg total) by mouth at bedtime.   No current facility-administered medications on file prior to visit.    Review of Systems  Constitutional: Negative for fever and unexpected weight change.  HENT: Negative for congestion, dental problem, ear pain, nosebleeds, postnasal drip, rhinorrhea, sinus pressure, sneezing, sore throat and trouble swallowing.   Eyes: Negative for redness and itching.  Respiratory: Positive for wheezing. Negative for cough, chest tightness and shortness of breath.   Cardiovascular: Negative for palpitations and leg swelling.  Gastrointestinal: Negative for nausea and vomiting.  Genitourinary: Negative for dysuria.  Musculoskeletal: Negative for joint  swelling.  Skin: Negative for rash.  Neurological: Negative for headaches.  Hematological: Does not bruise/bleed easily.  Psychiatric/Behavioral: Negative for dysphoric mood. The patient is not nervous/anxious.     Chief Complaint  Patient presents with  . PULMONARY CONSULT    Referred by Dr Sherren Mocha for Abn PFT. Trying to have knee replacement surgery.  Discuss flu vax    Tests PFT 10/23/15 >> FEV1 1.25 (50%), FEV1% 69, TLC 4.54 (87%), DLCO 52%, no BD  Vital signs BP 112/76 mmHg  Pulse 83  Ht 5\' 5"  (1.651 m)  Wt 136 lb 6.4 oz (61.871 kg)  BMI 22.70 kg/m2  SpO2 95%  History of present illness Stacy Garcia is a 65 y.o. female smoker for evaluation of abnormal PFT.  She is being assessed for knee replacement surgery.  She had PFTs to assess her respiratory status.  This showed moderate obstruction, and diffusion defect.  She has extensive history of smoking, and continues to smoke.  She gets intermittent episodes of wheezing.  She has not been on inhaler therapy.  She denies sputum, chest pain, fever, or hemoptysis.  She has lost about 15 lbs over the past 5 years.    She tries to stay active, and doesn't feel like her breathing limits her activity.  She denies history of asthma, allergies, pneumonia, or exposure to tuberculosis.  She denies skin rash or leg swelling.   Physical exam  General - No distress, thin ENT - No sinus tenderness, no oral exudate, no LAN, no thyromegaly, TM clear, pupils equal/reactive Cardiac - s1s2 regular, no murmur, pulses symmetric Chest - No wheeze/rales/dullness, good  air entry, normal respiratory excursion Back - No focal tenderness Abd - Soft, non-tender, no organomegaly, + bowel sounds Ext - No edema Neuro - Normal strength, cranial nerves intact Skin - No rashes Psych - Normal mood, and behavior   Dg Chest 2 View  10/16/2015  CLINICAL DATA:  Preop exam prior to knee replacement; current smoker, history of emphysema EXAM: CHEST  2 VIEW  COMPARISON:  No images in PACs ; report of a chest x-ray of February 26, 2002. FINDINGS: The lungs are well-expanded. The interstitial markings are coarse. There is no alveolar infiltrate. There is no pleural effusion. The heart and pulmonary vascularity are normal. The mediastinum is normal in width. There is gentle levocurvature centered in the mid to lower thoracic spine. There is mild tortuosity of the descending thoracic aorta. IMPRESSION: Chronic bronchitic changes. There is no acute cardiopulmonary abnormality. Electronically Signed   By: Stacy  Garcia M.D.   On: 10/16/2015 13:44    Lab Results  Component Value Date   WBC 7.1 09/01/2014   HGB 15.9* 09/01/2014   HCT 47.7* 09/01/2014   MCV 95.8 09/01/2014   PLT 243.0 09/01/2014    Lab Results  Component Value Date   CREATININE 0.9 09/01/2014   BUN 15 09/01/2014   NA 140 09/01/2014   K 4.9 09/01/2014   CL 103 09/01/2014   CO2 27 09/01/2014    Lab Results  Component Value Date   ALT 13 09/01/2014   AST 15 09/01/2014   ALKPHOS 74 09/01/2014   BILITOT 0.6 09/01/2014    Lab Results  Component Value Date   TSH 5.26* 09/01/2014    Discussion She has an extensive history of smoking.  She has a diagnosis of COPD.  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  She is being evaluated for right knee replacement surgery with Dr. Durward Fortes.     Assessment/plan  COPD with emphysema. Plan: - will have her try prn albuterol >> depending on response will determine if she needs to be on maintenance inhaler therapy - consider A1AT testing if she stops smoking - will re-assess her need for pulmonary rehab after she has knee replacement surgery  Tobacco abuse. She has expressed that she is not interested in smoking cessation at this time. Plan: - will revisit smoking cessation options at her next visit - will discuss option of low dose CT chest lung cancer screening at next visit  Pre-operative respiratory  assessment. Discussion: She is being assessed for right knee replacement surgery.  She is at increased risk for peri-operative pulmonary complications, but these risks are not prohibitive. Plan: - we discussed importance of smoking cessation prior to surgery  - she should have close monitoring of her oxygen during post-operative period - she should continue with inhaler therapy during the post-operative period - pulmonary service can be available as needed in the post-operative period    Patient Instructions  Ventolin 2 puffs every 4 to 6 hours as needed for cough, wheezing, or chest congestion  Flu shot today Pneumovax today  Follow up in 4 months     Chesley Mires, MD [place] [facility] Pager:  [phone]

## 2015-11-24 ENCOUNTER — Other Ambulatory Visit: Payer: Self-pay | Admitting: Internal Medicine

## 2015-11-25 ENCOUNTER — Encounter: Payer: Self-pay | Admitting: Adult Health

## 2015-11-25 ENCOUNTER — Ambulatory Visit (INDEPENDENT_AMBULATORY_CARE_PROVIDER_SITE_OTHER): Payer: Commercial Managed Care - HMO | Admitting: Adult Health

## 2015-11-25 VITALS — BP 120/80 | HR 105 | Temp 98.1°F | Resp 18 | Ht 65.0 in | Wt 136.0 lb

## 2015-11-25 DIAGNOSIS — J069 Acute upper respiratory infection, unspecified: Secondary | ICD-10-CM

## 2015-11-25 MED ORDER — METHYLPREDNISOLONE 4 MG PO TBPK: ORAL_TABLET | ORAL | Status: DC

## 2015-11-25 MED ORDER — DOXYCYCLINE HYCLATE 100 MG PO CAPS: 100.0000 mg | ORAL_CAPSULE | Freq: Two times a day (BID) | ORAL | Status: DC

## 2015-11-25 MED ORDER — BENZONATATE 100 MG PO CAPS: 100.0000 mg | ORAL_CAPSULE | Freq: Two times a day (BID) | ORAL | Status: DC | PRN

## 2015-11-25 NOTE — Patient Instructions (Addendum)
It was great seeing you again  I have sent in a prescription for Doxycyline, Prednisone and Tessalon [place].   I hope you feel better.    Upper Respiratory Infection, Adult Most upper respiratory infections (URIs) are a viral infection of the air passages leading to the lungs. A URI affects the nose, throat, and upper air passages. The most common type of URI is nasopharyngitis and is typically referred to as "the common cold." URIs run their course and usually go away on their own. Most of the time, a URI does not require medical attention, but sometimes a bacterial infection in the upper airways can follow a viral infection. This is called a secondary infection. Sinus and middle ear infections are common types of secondary upper respiratory infections. Bacterial pneumonia can also complicate a URI. A URI can worsen asthma and chronic obstructive pulmonary disease (COPD). Sometimes, these complications can require emergency medical care and may be life threatening.  CAUSES Almost all URIs are caused by viruses. A virus is a type of germ and can spread from one person to another.  RISKS FACTORS You may be at risk for a URI if:   You smoke.   You have chronic heart or lung disease.  You have a weakened defense (immune) system.   You are very young or very old.   You have nasal allergies or asthma.  You work in crowded or poorly ventilated areas.  You work in health care facilities or schools. SIGNS AND SYMPTOMS  Symptoms typically develop 2-3 days after you come in contact with a cold virus. Most viral URIs last 7-10 days. However, viral URIs from the influenza virus (flu virus) can last 14-18 days and are typically more severe. Symptoms may include:   Runny or stuffy (congested) nose.   Sneezing.   Cough.   Sore throat.   Headache.   Fatigue.   Fever.   Loss of appetite.   Pain in your forehead, behind your eyes, and over your cheekbones (sinus  pain).  Muscle aches.  DIAGNOSIS  Your health care provider may diagnose a URI by:  Physical exam.  Tests to check that your symptoms are not due to another condition such as:  Strep throat.  Sinusitis.  Pneumonia.  Asthma. TREATMENT  A URI goes away on its own with time. It cannot be cured with medicines, but medicines may be prescribed or recommended to relieve symptoms. Medicines may help:  Reduce your fever.  Reduce your cough.  Relieve nasal congestion. HOME CARE INSTRUCTIONS   Take medicines only as directed by your health care provider.   Gargle warm saltwater or take cough drops to comfort your throat as directed by your health care provider.  Use a warm mist humidifier or inhale steam from a shower to increase air moisture. This may make it easier to breathe.  Drink enough fluid to keep your urine clear or pale yellow.   Eat soups and other clear broths and maintain good nutrition.   Rest as needed.   Return to work when your temperature has returned to normal or as your health care provider advises. You may need to stay home longer to avoid infecting others. You can also use a face mask and careful hand washing to prevent spread of the virus.  Increase the usage of your inhaler if you have asthma.   Do not use any tobacco products, including cigarettes, chewing tobacco, or electronic cigarettes. If you need help quitting, ask your health care  provider. PREVENTION  The best way to protect yourself from getting a cold is to practice good hygiene.   Avoid oral or hand contact with people with cold symptoms.   Wash your hands often if contact occurs.  There is no clear evidence that vitamin C, vitamin E, echinacea, or exercise reduces the chance of developing a cold. However, it is always recommended to get plenty of rest, exercise, and practice good nutrition.  SEEK MEDICAL CARE IF:   You are getting worse rather than better.   Your symptoms are  not controlled by medicine.   You have chills.  You have worsening shortness of breath.  You have brown or red mucus.  You have yellow or brown nasal discharge.  You have pain in your face, especially when you bend forward.  You have a fever.  You have swollen neck glands.  You have pain while swallowing.  You have white areas in the back of your throat. SEEK IMMEDIATE MEDICAL CARE IF:   You have severe or persistent:  Headache.  Ear pain.  Sinus pain.  Chest pain.  You have chronic lung disease and any of the following:  Wheezing.  Prolonged cough.  Coughing up blood.  A change in your usual mucus.  You have a stiff neck.  You have changes in your:  Vision.  Hearing.  Thinking.  Mood. MAKE SURE YOU:   Understand these instructions.  Will watch your condition.  Will get help right away if you are not doing well or get worse.   This information is not intended to replace advice given to you by your health care provider. Make sure you discuss any questions you have with your health care provider.   Document Released: 02/19/2001 Document Revised: 01/10/2015 Document Reviewed: 12/01/2013 Elsevier Interactive Patient Education [employer].

## 2015-11-25 NOTE — Progress Notes (Signed)
Pre visit review using our clinic review tool, if applicable. No additional management support is needed unless otherwise documented below in the visit note. 

## 2015-11-25 NOTE — Progress Notes (Signed)
   Subjective:    Patient ID: Stacy Garcia, female    DOB: 1951/05/01, 65 y.o.   MRN: [id number]  Cough This is a new problem. The current episode started in the past 7 days. The problem has been gradually worsening. The problem occurs constantly. The cough is non-productive. Associated symptoms include chills, ear pain, a fever, headaches, nasal congestion, a sore throat and wheezing. She has tried OTC cough suppressant for the symptoms. The treatment provided mild relief. Her past medical history is significant for COPD. There is no history of pneumonia.  Headache  Associated symptoms include coughing, ear pain, a fever and a sore throat.    Review of Systems  Constitutional: Positive for fever and chills.  HENT: Positive for ear pain and sore throat.   Respiratory: Positive for cough and wheezing.   Neurological: Positive for headaches.       Objective:   Physical Exam  Constitutional: She is oriented to person, place, and time. She appears well-developed and well-nourished. No distress.  HENT:  Nose: Right sinus exhibits maxillary sinus tenderness and frontal sinus tenderness. Left sinus exhibits maxillary sinus tenderness and frontal sinus tenderness.  Neck: Normal range of motion. Neck supple.  Cardiovascular: Normal rate, regular rhythm, normal heart sounds and intact distal pulses.  Exam reveals no gallop and no friction rub.   No murmur heard. Pulmonary/Chest: Effort normal. No respiratory distress. She has wheezes in the right middle field, the right lower field and the left upper field. She has no rales. She exhibits no tenderness.  Lymphadenopathy:    She has cervical adenopathy.       Right cervical: Superficial cervical adenopathy present.       Left cervical: Superficial cervical adenopathy present.  Neurological: She is alert and oriented to person, place, and time.  Skin: Skin is warm and dry. No rash noted. She is not diaphoretic. No erythema. No pallor.    Psychiatric: She has a normal mood and affect. Her behavior is normal. Judgment and thought content normal.  Nursing note and vitals reviewed.     Assessment & Plan:  1. Acute upper respiratory infection - doxycycline (VIBRAMYCIN) 100 MG capsule; Take 1 capsule (100 mg total) by mouth 2 (two) times daily.  Dispense: 14 capsule; Refill: 0 - methylPREDNISolone (MEDROL DOSEPAK) 4 MG TBPK tablet; Take as directed  Dispense: 21 tablet; Refill: 0 - benzonatate (TESSALON) 100 MG capsule; Take 1 capsule (100 mg total) by mouth 2 (two) times daily as needed for cough.  Dispense: 20 capsule; Refill: 1

## 2015-12-19 ENCOUNTER — Other Ambulatory Visit: Payer: Self-pay | Admitting: Family Medicine

## 2016-01-11 NOTE — Pre-Procedure Instructions (Signed)
[name]  01/11/2016      [id number] Stacy Garcia, [place] [phone] [facility] [address] [place] [address] Phone: [phone] Fax: [phone]  [facility] [place], [address] [place] [address] Phone: [phone] Fax: [phone]    Your procedure is scheduled on   Tuesday  01/23/16  Report to [facility] Admitting at 800 A.M.  Call this number if you have problems the morning of surgery:  [phone]   Remember:  Do not eat food or drink liquids after midnight.  Take these medicines the morning of surgery with A SIP OF WATER    ALBUTEROL,   DIAZEPAM, HYDROCODONE , LEVOTHYROXINE, OMEPRAZOLE (PRILOSEC)                                                                                                                                            (STOP ASPIRIN, IBUPROFEN/ ADVIL/ MOTRIN, NO GOODY POWDERS/ BC'S, HERBAL MEDICINES)   Do not wear jewelry, make-up or nail polish.  Do not wear lotions, powders, or perfumes.  You may wear deodorant.  Do not shave 48 hours prior to surgery.  Men may shave face and neck.  Do not bring valuables to the hospital.  [facility] is not responsible for any belongings or valuables.  Contacts, dentures or bridgework may not be worn into surgery.  Leave your suitcase in the car.  After surgery it may be brought to your room.  For patients admitted to the hospital, discharge time will be determined by your treatment team.  Patients discharged the day of surgery will not be allowed to drive home.   Name and phone number of your driver:   Special instructions:  Stacy Garcia - Preparing for Surgery  Before surgery, you can play an important role.  Because skin is not sterile, your skin needs to be as free of germs as possible.  You can reduce the number of germs on you skin by washing with CHG (chlorahexidine gluconate) soap before  surgery.  CHG is an antiseptic cleaner which kills germs and bonds with the skin to continue killing germs even after washing.  Please DO NOT use if you have an allergy to CHG or antibacterial soaps.  If your skin becomes reddened/irritated stop using the CHG and inform your nurse when you arrive at Short Stay.  Do not shave (including legs and underarms) for at least 48 hours prior to the first CHG shower.  You may shave your face.  Please follow these instructions carefully:   1.  Shower with CHG Soap the night before surgery and the  morning of Surgery.  2.  If you choose to wash your hair, wash your hair first as usual with your       normal shampoo.  3.  After you shampoo, rinse your hair and body thoroughly to remove the                      Shampoo.  4.  Use CHG as you would any other liquid soap.  You can apply chg directly       to the skin and wash gently with scrungie or a clean washcloth.  5.  Apply the CHG Soap to your body ONLY FROM THE NECK DOWN.        Do not use on open wounds or open sores.  Avoid contact with your eyes,       ears, mouth and genitals (private parts).  Wash genitals (private parts)       with your normal soap.  6.  Wash thoroughly, paying special attention to the area where your surgery        will be performed.  7.  Thoroughly rinse your body with warm water from the neck down.  8.  DO NOT shower/wash with your normal soap after using and rinsing off       the CHG Soap.  9.  Pat yourself dry with a clean towel.            10.  Wear clean pajamas.            11.  Place clean sheets on your bed the night of your first shower and do not        sleep with pets.  Day of Surgery  Do not apply any lotions/deoderants the morning of surgery.  Please wear clean clothes to the hospital/surgery center.    Please read over the following fact sheets that you were given. Pain Booklet, Coughing and Deep Breathing, MRSA Information and  Surgical Site Infection Prevention

## 2016-01-12 ENCOUNTER — Encounter (HOSPITAL_COMMUNITY)
Admission: RE | Admit: 2016-01-12 | Discharge: 2016-01-12 | Disposition: A | Discharge status: Home / Self Care | Payer: Commercial Managed Care - HMO | Source: Ambulatory Visit | Attending: Orthopaedic Surgery | Admitting: Orthopaedic Surgery

## 2016-01-12 ENCOUNTER — Encounter (HOSPITAL_COMMUNITY): Payer: Self-pay

## 2016-01-12 DIAGNOSIS — Z0183 Encounter for blood typing: Secondary | ICD-10-CM | POA: Insufficient documentation

## 2016-01-12 DIAGNOSIS — Z01812 Encounter for preprocedural laboratory examination: Secondary | ICD-10-CM | POA: Insufficient documentation

## 2016-01-12 DIAGNOSIS — M1711 Unilateral primary osteoarthritis, right knee: Secondary | ICD-10-CM | POA: Diagnosis not present

## 2016-01-12 HISTORY — DX: Unspecified osteoarthritis, unspecified site: M19.90

## 2016-01-12 HISTORY — DX: Gastro-esophageal reflux disease without esophagitis: K21.9

## 2016-01-12 HISTORY — DX: Hypothyroidism, unspecified: E03.9

## 2016-01-12 LAB — COMPREHENSIVE METABOLIC PANEL
ALK PHOS: 73 U/L (ref 38–126)
ALT: 15 U/L (ref 14–54)
AST: 17 U/L (ref 15–41)
Albumin: 4.2 g/dL (ref 3.5–5.0)
Anion gap: 11 (ref 5–15)
BUN: 10 mg/dL (ref 6–20)
CHLORIDE: 106 mmol/L (ref 101–111)
CO2: 26 mmol/L (ref 22–32)
CREATININE: 0.77 mg/dL (ref 0.44–1.00)
Calcium: 9.7 mg/dL (ref 8.9–10.3)
GFR calc Af Amer: >60 mL/min (ref >60)
Glucose, Bld: 89 mg/dL (ref 65–99)
Potassium: 3.7 mmol/L (ref 3.5–5.1)
Sodium: 143 mmol/L (ref 135–145)
TOTAL PROTEIN: 7 g/dL (ref 6.5–8.1)
Total Bilirubin: 0.5 mg/dL (ref 0.3–1.2)

## 2016-01-12 LAB — CBC WITH DIFFERENTIAL/PLATELET
BASOS ABS: 0 10*3/uL (ref 0.0–0.1)
Basophils Relative: 1 %
EOS PCT: 3 %
Eosinophils Absolute: 0.3 10*3/uL (ref 0.0–0.7)
HEMATOCRIT: 50.7 % — AB (ref 36.0–46.0)
HEMOGLOBIN: 16.9 g/dL — AB (ref 12.0–15.0)
LYMPHS ABS: 3.3 10*3/uL (ref 0.7–4.0)
LYMPHS PCT: 39 %
MCH: 32.2 pg (ref 26.0–34.0)
MCHC: 33.3 g/dL (ref 30.0–36.0)
MCV: 96.6 fL (ref 78.0–100.0)
Monocytes Absolute: 0.4 10*3/uL (ref 0.1–1.0)
Monocytes Relative: 5 %
NEUTROS ABS: 4.5 10*3/uL (ref 1.7–7.7)
NEUTROS PCT: 52 %
PLATELETS: 239 10*3/uL (ref 150–400)
RBC: 5.25 MIL/uL — AB (ref 3.87–5.11)
RDW: 13.4 % (ref 11.5–15.5)
WBC: 8.5 10*3/uL (ref 4.0–10.5)

## 2016-01-12 LAB — URINALYSIS, ROUTINE W REFLEX MICROSCOPIC
Bilirubin Urine: NEGATIVE
Glucose, UA: NEGATIVE mg/dL
Hgb urine dipstick: NEGATIVE
KETONES UR: NEGATIVE mg/dL
LEUKOCYTES UA: NEGATIVE
NITRITE: NEGATIVE
PH: 5.5 (ref 5.0–8.0)
PROTEIN: NEGATIVE mg/dL
Specific Gravity, Urine: 1.007 (ref 1.005–1.030)

## 2016-01-12 LAB — ABO/RH: ABO/RH(D): A POS

## 2016-01-12 LAB — PROTIME-INR
INR: 1.02 (ref 0.00–1.49)
PROTHROMBIN TIME: 13.6 s (ref 11.6–15.2)

## 2016-01-12 LAB — APTT: APTT: 28 s (ref 24–37)

## 2016-01-12 LAB — TYPE AND SCREEN
ABO/RH(D): A POS
Antibody Screen: NEGATIVE

## 2016-01-12 LAB — SURGICAL PCR SCREEN
MRSA, PCR: NEGATIVE
STAPHYLOCOCCUS AUREUS: NEGATIVE

## 2016-01-14 LAB — URINE CULTURE

## 2016-01-15 NOTE — Progress Notes (Signed)
Left a message with Amy at Dr.Whitfield's office about results from urine culture.

## 2016-01-16 ENCOUNTER — Telehealth: Payer: Self-pay | Admitting: Family Medicine

## 2016-01-16 DIAGNOSIS — M25561 Pain in right knee: Secondary | ICD-10-CM

## 2016-01-16 NOTE — Telephone Encounter (Signed)
Pt need referral for knee surgery 01/23/16 and facility need new referral  Dr. [name].

## 2016-01-16 NOTE — Telephone Encounter (Signed)
Referral placed per Dr [name] 

## 2016-01-17 DIAGNOSIS — M1711 Unilateral primary osteoarthritis, right knee: Secondary | ICD-10-CM | POA: Diagnosis not present

## 2016-01-19 ENCOUNTER — Other Ambulatory Visit: Payer: Commercial Managed Care - HMO

## 2016-01-22 MED ORDER — TRANEXAMIC ACID 1000 MG/10ML IV SOLN
2000.0000 mg | INTRAVENOUS | Status: DC
  Filled 2016-01-22: qty 20

## 2016-01-22 NOTE — H&P (Addendum)
CHIEF COMPLAINT:  Painful right knee.   HISTORY OF PRESENT ILLNESS:  Stacy Garcia is a very pleasant 65 year old white female who is seen today for evaluation of her right knee.  She has had problems with her right knee for several months dating back to August 2016 when she was seen for pain and discomfort in the right knee.  She had at that time problems sleeping for at least 2 months with recurrent swelling and effusions.  At that time it was more of an achiness and soreness.  She did not have any history of injury or trauma.  X-rays at that time revealed near bone-on-bone medial compartment OA with subchondral sclerosing and some patellofemoral osteoarthritis.  She was given a cortisone injection at that time.  She had returned in followup and continued to have pain and discomfort.  She received a 2nd cortisone injection on 06/20/2015.  A series of 3 Euflexxa injections was then given, finishing up on 07/19/2015.  She has continued to have pain in the right knee now to the point where she has pain with every step as well as nighttime pain.  She finds she is having difficulty on stairs as well as ambulating without any pain.  She does occasionally have some giving way noted.  She has been tried with corticosteroid injections as well as the viscosupplementation with only minimal amounts of relief.  It has now gotten to the point where she has been taking hydrocodone on a chronic basis which had been prescribed by her primary care.  She comes in today for reevaluation.   PAST MEDICAL HISTORY:  In general, her health is fair.   HOSPITALIZATIONS:  C-section x3 in 1968, 1970, and 1971.   MEDICATIONS:  Zocor 20 mg daily, Synthroid 0.88 mg daily, diazepam 5 mg daily.   ALLERGIES:  None known.   FOURTEEN-POINT REVIEW OF SYSTEMS:  Positive for bladder infection.  She also has sleep apnea but has not used a CPAP machine.  All other symptomatology was denied.   FAMILY HISTORY:  Positive for mother who died at age 75  from congestive heart failure.  Father died at age 30 from renal failure.  She has had 4 brothers that have died, 1 which was stillborn, 12 who was 67 years old and was struck by a car.  The other 2 that had deceased were at age 38.  One had congestive heart failure, and the other one had a stroke and myocardial infarction.  She has 2 living brothers, ages 43 and 71.  She has 5 living sisters ages 26, 39, 58, 21, 80, and 7 who are healthy except for diabetes.   SOCIAL HISTORY:  Stacy Garcia is a 65 year old white married female.  She smokes 1 pack of cigarettes per day and has smoked for 50 years.  She denies the use of alcohol which causes her to be sick.   PHYSICAL EXAMINATION:   General:  Exam today reveals a 65 year old white female well developed, thin, alert, cooperative in moderate distress secondary to right knee pain. Vitals:  Height 5 feet 4 inches.  Weight 138 pounds.  BMI 23.7.  Pulse 84.  Respirations 12.  Blood pressure 130/80.  Temperature 97 degrees. Head:  Normocephalic. Eyes:  Pupils equal, round, and reactive to light and accommodation.  Extraocular movements intact. Ears/Nose/Throat:  Benign. Neck:  Supple.  No carotid bruits. Chest:  Had fair expansion. Lungs:  Had markedly decreased breath sounds bilaterally and were coarse in nature. Cardiac:  Had a regular  rhythm and rate.  Normal S1, S2.  No discrete murmurs, rubs, or gallops.  Distant heart sounds. Pulses:  Were 1+, bilateral and symmetric, in the lower extremities. Abdomen:  Scaphoid, soft, nontender.  No mass palpable.  Normal bowel sounds are present. CNS:  Oriented x3, and cranial nerves II through XII grossly intact. Breast/Rectal/Genital:  Not indicated for an orthopedic evaluation. Musculoskeletal:  Today she lacked about 2 degrees of full extension with flexion to about 120 degrees.  She had crepitance with range of motion of the knee.  Had a trace effusion.  She has good patellofemoral motion.  Does have some pseudolaxity  with varus and valgus stressing but good endpoints.   RADIOGRAPHS:  Studies reveal near bone-on-bone medial compartment OA with periarticular spurring noted.  Marked sclerosis of the medial tibial plateau and femoral condyle, less along the lateral joint.  She does have some patellofemoral periarticular spurring with some narrowing noted.  Some calcification in the femoral artery.   CLINICAL IMPRESSION:   1.  End-stage OA of the right knee. 2.  Cigarette smoker. 3.  History of sleep apnea.   RECOMMENDATIONS:   1.  At this time I reviewed evaluations by Dr. Sherren Mocha who felt that she was, from a medical standpoint, cleared for the procedure of a total knee.  Also reviewed a note from [facility] which revealed PFTs revealed a moderate obstruction and diffusion defect.  This was felt on the basis of her cigarette smoking.  It was felt that it would be important for smoking cessation prior to surgery and monitoring of her oxygenation postoperatively.  Ventolin 2 puffs every 4 to 6 hours as needed for cough, wheezing, and chest congestion was also suggested. 2.  Therefore, our plan is to proceed with a right total knee arthroplasty.  The procedure, risks, and benefits were fully explained to her, and she is understanding.  All questions were answered.  Appropriate models were used to illustrate.    Stacy Garcia [place], [place] [phone]  01/23/2016 7:21 AM

## 2016-01-22 NOTE — Anesthesia Preprocedure Evaluation (Addendum)
Anesthesia Evaluation  Patient identified by MRN, date of birth, ID band Patient awake    Reviewed: Allergy & Precautions, H&P , NPO status , Patient's Chart, lab work & pertinent test results  Airway Mallampati: II  TM Distance: >3 FB Neck ROM: Full    Dental no notable dental hx. (+) Upper Dentures, Lower Dentures, Dental Advisory Given   Pulmonary COPD,  COPD inhaler, Current Smoker,    Pulmonary exam normal breath sounds clear to auscultation       Cardiovascular negative cardio ROS   Rhythm:Regular Rate:Normal     Neuro/Psych  Headaches,    GI/Hepatic Neg liver ROS, GERD  Medicated and Controlled,  Endo/Other  Hypothyroidism   Renal/GU negative Renal ROS  negative genitourinary   Musculoskeletal  (+) Arthritis , Osteoarthritis,    Abdominal   Peds  Hematology negative hematology ROS (+)   Anesthesia Other Findings   Reproductive/Obstetrics negative OB ROS                            Anesthesia Physical Anesthesia Plan  ASA: II  Anesthesia Plan: Spinal and Regional   Post-op Pain Management:  Regional for Post-op pain   Induction: Intravenous  Airway Management Planned: Simple Face Mask  Additional Equipment:   Intra-op Plan:   Post-operative Plan:   Informed Consent: I have reviewed the patients History and Physical, chart, labs and discussed the procedure including the risks, benefits and alternatives for the proposed anesthesia with the patient or authorized representative who has indicated his/her understanding and acceptance.   Dental advisory given  Plan Discussed with: CRNA  Anesthesia Plan Comments:         Anesthesia Quick Evaluation

## 2016-01-23 ENCOUNTER — Inpatient Hospital Stay (HOSPITAL_COMMUNITY): Payer: Commercial Managed Care - HMO | Admitting: Anesthesiology

## 2016-01-23 ENCOUNTER — Encounter (HOSPITAL_COMMUNITY): Payer: Self-pay | Admitting: *Deleted

## 2016-01-23 ENCOUNTER — Encounter (HOSPITAL_COMMUNITY)
Admission: RE | Disposition: A | Discharge status: Home Health | Payer: Self-pay | Source: Ambulatory Visit | Attending: Orthopaedic Surgery

## 2016-01-23 ENCOUNTER — Inpatient Hospital Stay (HOSPITAL_COMMUNITY)
Admission: RE | Admit: 2016-01-23 | Discharge: 2016-01-25 | DRG: 470 | Disposition: A | Discharge status: Home Health | Payer: Commercial Managed Care - HMO | Source: Ambulatory Visit | Attending: Orthopaedic Surgery | Admitting: Orthopaedic Surgery

## 2016-01-23 DIAGNOSIS — Z96651 Presence of right artificial knee joint: Secondary | ICD-10-CM | POA: Diagnosis not present

## 2016-01-23 DIAGNOSIS — E039 Hypothyroidism, unspecified: Secondary | ICD-10-CM | POA: Diagnosis not present

## 2016-01-23 DIAGNOSIS — J449 Chronic obstructive pulmonary disease, unspecified: Secondary | ICD-10-CM | POA: Diagnosis not present

## 2016-01-23 DIAGNOSIS — Z8249 Family history of ischemic heart disease and other diseases of the circulatory system: Secondary | ICD-10-CM

## 2016-01-23 DIAGNOSIS — F17210 Nicotine dependence, cigarettes, uncomplicated: Secondary | ICD-10-CM | POA: Diagnosis not present

## 2016-01-23 DIAGNOSIS — G4730 Sleep apnea, unspecified: Secondary | ICD-10-CM | POA: Diagnosis present

## 2016-01-23 DIAGNOSIS — Z833 Family history of diabetes mellitus: Secondary | ICD-10-CM

## 2016-01-23 DIAGNOSIS — M25561 Pain in right knee: Secondary | ICD-10-CM | POA: Diagnosis present

## 2016-01-23 DIAGNOSIS — G8918 Other acute postprocedural pain: Secondary | ICD-10-CM | POA: Diagnosis not present

## 2016-01-23 DIAGNOSIS — E785 Hyperlipidemia, unspecified: Secondary | ICD-10-CM | POA: Diagnosis not present

## 2016-01-23 DIAGNOSIS — M179 Osteoarthritis of knee, unspecified: Secondary | ICD-10-CM | POA: Diagnosis not present

## 2016-01-23 DIAGNOSIS — K219 Gastro-esophageal reflux disease without esophagitis: Secondary | ICD-10-CM | POA: Diagnosis present

## 2016-01-23 DIAGNOSIS — M1711 Unilateral primary osteoarthritis, right knee: Principal | ICD-10-CM | POA: Diagnosis present

## 2016-01-23 DIAGNOSIS — Z96659 Presence of unspecified artificial knee joint: Secondary | ICD-10-CM

## 2016-01-23 HISTORY — PX: TOTAL KNEE ARTHROPLASTY: SHX125

## 2016-01-23 SURGERY — ARTHROPLASTY, KNEE, TOTAL
Anesthesia: Regional | Site: Knee | Laterality: Right

## 2016-01-23 MED ORDER — HYDROMORPHONE HCL 1 MG/ML IJ SOLN: 0.2500 mg | INTRAMUSCULAR | Status: DC | PRN

## 2016-01-23 MED ORDER — BISACODYL 10 MG RE SUPP
10.0000 mg | Freq: Every day | RECTAL | Status: DC | PRN
  Administered 2016-01-24: 10 mg via RECTAL
  Filled 2016-01-23: qty 1

## 2016-01-23 MED ORDER — ALBUTEROL SULFATE (2.5 MG/3ML) 0.083% IN NEBU: 2.5000 mg | INHALATION_SOLUTION | Freq: Four times a day (QID) | RESPIRATORY_TRACT | Status: DC | PRN

## 2016-01-23 MED ORDER — ROCURONIUM BROMIDE 50 MG/5ML IV SOLN
INTRAVENOUS | Status: AC
  Filled 2016-01-23: qty 1

## 2016-01-23 MED ORDER — ONDANSETRON HCL 4 MG/2ML IJ SOLN
INTRAMUSCULAR | Status: AC
  Filled 2016-01-23: qty 2

## 2016-01-23 MED ORDER — CEFAZOLIN SODIUM-DEXTROSE 2-4 GM/100ML-% IV SOLN
2.0000 g | Freq: Four times a day (QID) | INTRAVENOUS | Status: AC
  Administered 2016-01-23 (×2): 2 g via INTRAVENOUS
  Filled 2016-01-23 (×2): qty 100

## 2016-01-23 MED ORDER — PROPOFOL 10 MG/ML IV BOLUS
INTRAVENOUS | Status: DC | PRN
  Administered 2016-01-23: 50 mg via INTRAVENOUS

## 2016-01-23 MED ORDER — PROPOFOL 10 MG/ML IV BOLUS
INTRAVENOUS | Status: AC
  Filled 2016-01-23: qty 20

## 2016-01-23 MED ORDER — MIDAZOLAM HCL 2 MG/2ML IJ SOLN
INTRAMUSCULAR | Status: AC
  Filled 2016-01-23: qty 2

## 2016-01-23 MED ORDER — BUPIVACAINE-EPINEPHRINE 0.25% -1:200000 IJ SOLN
INTRAMUSCULAR | Status: DC | PRN
  Administered 2016-01-23: 30 mL

## 2016-01-23 MED ORDER — SUCCINYLCHOLINE CHLORIDE 200 MG/10ML IV SOSY
PREFILLED_SYRINGE | INTRAVENOUS | Status: AC
  Filled 2016-01-23: qty 10

## 2016-01-23 MED ORDER — ACETAMINOPHEN 10 MG/ML IV SOLN: 1000.0000 mg | Freq: Four times a day (QID) | INTRAVENOUS | Status: DC

## 2016-01-23 MED ORDER — CHLORHEXIDINE GLUCONATE 4 % EX LIQD: 60.0000 mL | Freq: Once | CUTANEOUS | Status: DC

## 2016-01-23 MED ORDER — SODIUM CHLORIDE 0.9 % IV SOLN: INTRAVENOUS | Status: DC

## 2016-01-23 MED ORDER — SODIUM CHLORIDE 0.9 % IR SOLN
Status: DC | PRN
  Administered 2016-01-23: 1000 mL

## 2016-01-23 MED ORDER — ACETAMINOPHEN 10 MG/ML IV SOLN
1000.0000 mg | Freq: Four times a day (QID) | INTRAVENOUS | Status: DC
  Administered 2016-01-23 – 2016-01-24 (×3): 1000 mg via INTRAVENOUS
  Filled 2016-01-23 (×3): qty 100

## 2016-01-23 MED ORDER — KETOROLAC TROMETHAMINE 15 MG/ML IJ SOLN
7.5000 mg | Freq: Four times a day (QID) | INTRAMUSCULAR | Status: AC
  Administered 2016-01-23 – 2016-01-24 (×4): 7.5 mg via INTRAVENOUS
  Filled 2016-01-23 (×4): qty 1

## 2016-01-23 MED ORDER — MAGNESIUM CITRATE PO SOLN: 1.0000 | Freq: Once | ORAL | Status: DC | PRN

## 2016-01-23 MED ORDER — MIDAZOLAM HCL 5 MG/5ML IJ SOLN
INTRAMUSCULAR | Status: DC | PRN
  Administered 2016-01-23: 2 mg via INTRAVENOUS

## 2016-01-23 MED ORDER — FENTANYL CITRATE (PF) 250 MCG/5ML IJ SOLN
INTRAMUSCULAR | Status: AC
  Filled 2016-01-23: qty 5

## 2016-01-23 MED ORDER — RIVAROXABAN 10 MG PO TABS
10.0000 mg | ORAL_TABLET | Freq: Every day | ORAL | Status: DC
  Administered 2016-01-24 – 2016-01-25 (×2): 10 mg via ORAL
  Filled 2016-01-23 (×2): qty 1

## 2016-01-23 MED ORDER — DIPHENHYDRAMINE HCL 12.5 MG/5ML PO ELIX: 12.5000 mg | ORAL_SOLUTION | ORAL | Status: DC | PRN

## 2016-01-23 MED ORDER — MENTHOL 3 MG MT LOZG
1.0000 | LOZENGE | OROMUCOSAL | Status: DC | PRN
  Filled 2016-01-23: qty 9

## 2016-01-23 MED ORDER — 0.9 % SODIUM CHLORIDE (POUR BTL) OPTIME
TOPICAL | Status: DC | PRN
  Administered 2016-01-23: 1000 mL

## 2016-01-23 MED ORDER — DIAZEPAM 2 MG PO TABS
2.0000 mg | ORAL_TABLET | Freq: Two times a day (BID) | ORAL | Status: DC | PRN
  Administered 2016-01-23: 2 mg via ORAL
  Administered 2016-01-24: 4 mg via ORAL
  Administered 2016-01-24 (×2): 2 mg via ORAL
  Administered 2016-01-25: 4 mg via ORAL
  Filled 2016-01-23: qty 1
  Filled 2016-01-23 (×2): qty 2
  Filled 2016-01-23 (×2): qty 1

## 2016-01-23 MED ORDER — POLYETHYLENE GLYCOL 3350 17 G PO PACK: 17.0000 g | PACK | Freq: Every day | ORAL | Status: DC | PRN

## 2016-01-23 MED ORDER — BUPIVACAINE-EPINEPHRINE (PF) 0.25% -1:200000 IJ SOLN
INTRAMUSCULAR | Status: AC
  Filled 2016-01-23: qty 30

## 2016-01-23 MED ORDER — PHENYLEPHRINE 40 MCG/ML (10ML) SYRINGE FOR IV PUSH (FOR BLOOD PRESSURE SUPPORT)
PREFILLED_SYRINGE | INTRAVENOUS | Status: AC
  Filled 2016-01-23: qty 10

## 2016-01-23 MED ORDER — CEFAZOLIN SODIUM-DEXTROSE 2-4 GM/100ML-% IV SOLN
2.0000 g | INTRAVENOUS | Status: AC
  Administered 2016-01-23: 2 g via INTRAVENOUS
  Filled 2016-01-23: qty 100

## 2016-01-23 MED ORDER — EPHEDRINE 5 MG/ML INJ
INTRAVENOUS | Status: AC
  Filled 2016-01-23: qty 10

## 2016-01-23 MED ORDER — ONDANSETRON HCL 4 MG/2ML IJ SOLN: 4.0000 mg | Freq: Four times a day (QID) | INTRAMUSCULAR | Status: DC | PRN

## 2016-01-23 MED ORDER — TRANEXAMIC ACID 1000 MG/10ML IV SOLN
2000.0000 mg | INTRAVENOUS | Status: DC | PRN
  Administered 2016-01-23: 2000 mg via TOPICAL

## 2016-01-23 MED ORDER — PHENOL 1.4 % MT LIQD: 1.0000 | OROMUCOSAL | Status: DC | PRN

## 2016-01-23 MED ORDER — SIMVASTATIN 40 MG PO TABS
40.0000 mg | ORAL_TABLET | Freq: Every day | ORAL | Status: DC
  Administered 2016-01-23 – 2016-01-24 (×2): 40 mg via ORAL
  Filled 2016-01-23 (×2): qty 1

## 2016-01-23 MED ORDER — LIDOCAINE 2% (20 MG/ML) 5 ML SYRINGE
INTRAMUSCULAR | Status: AC
  Filled 2016-01-23: qty 5

## 2016-01-23 MED ORDER — ALUM & MAG HYDROXIDE-SIMETH 200-200-20 MG/5ML PO SUSP: 30.0000 mL | ORAL | Status: DC | PRN

## 2016-01-23 MED ORDER — METOCLOPRAMIDE HCL 5 MG/ML IJ SOLN
5.0000 mg | Freq: Three times a day (TID) | INTRAMUSCULAR | Status: DC | PRN
Start: 2016-01-23 — End: 2016-01-25

## 2016-01-23 MED ORDER — FENTANYL CITRATE (PF) 100 MCG/2ML IJ SOLN
INTRAMUSCULAR | Status: DC | PRN
  Administered 2016-01-23: 100 ug via INTRAVENOUS
  Administered 2016-01-23: 50 ug via INTRAVENOUS

## 2016-01-23 MED ORDER — LIDOCAINE HCL (CARDIAC) 20 MG/ML IV SOLN
INTRAVENOUS | Status: DC | PRN
  Administered 2016-01-23: 60 mg via INTRAVENOUS

## 2016-01-23 MED ORDER — BUPIVACAINE IN DEXTROSE 0.75-8.25 % IT SOLN
INTRATHECAL | Status: DC | PRN
  Administered 2016-01-23: 13.5 mg via INTRATHECAL

## 2016-01-23 MED ORDER — SODIUM CHLORIDE 0.9 % IV SOLN
INTRAVENOUS | Status: DC
  Administered 2016-01-23 – 2016-01-24 (×3): via INTRAVENOUS

## 2016-01-23 MED ORDER — ONDANSETRON HCL 4 MG PO TABS: 4.0000 mg | ORAL_TABLET | Freq: Four times a day (QID) | ORAL | Status: DC | PRN

## 2016-01-23 MED ORDER — LEVOTHYROXINE SODIUM 88 MCG PO TABS
88.0000 ug | ORAL_TABLET | Freq: Every day | ORAL | Status: DC
  Administered 2016-01-24 – 2016-01-25 (×2): 88 ug via ORAL
  Filled 2016-01-23 (×2): qty 1

## 2016-01-23 MED ORDER — PROPOFOL 500 MG/50ML IV EMUL
INTRAVENOUS | Status: DC | PRN
  Administered 2016-01-23: 30 ug/kg/min via INTRAVENOUS
  Administered 2016-01-23: 50 ug/kg/min via INTRAVENOUS

## 2016-01-23 MED ORDER — OXYCODONE HCL 5 MG PO TABS
5.0000 mg | ORAL_TABLET | ORAL | Status: DC | PRN
  Administered 2016-01-23 – 2016-01-24 (×6): 10 mg via ORAL
  Administered 2016-01-24: 5 mg via ORAL
  Administered 2016-01-24 – 2016-01-25 (×5): 10 mg via ORAL
  Filled 2016-01-23: qty 2
  Filled 2016-01-23: qty 1
  Filled 2016-01-23 (×11): qty 2

## 2016-01-23 MED ORDER — DOCUSATE SODIUM 100 MG PO CAPS
100.0000 mg | ORAL_CAPSULE | Freq: Two times a day (BID) | ORAL | Status: DC
  Administered 2016-01-23 – 2016-01-25 (×5): 100 mg via ORAL
  Filled 2016-01-23 (×6): qty 1

## 2016-01-23 MED ORDER — METOCLOPRAMIDE HCL 5 MG PO TABS
5.0000 mg | ORAL_TABLET | Freq: Three times a day (TID) | ORAL | Status: DC | PRN
Start: 2016-01-23 — End: 2016-01-25

## 2016-01-23 MED ORDER — LACTATED RINGERS IV SOLN
INTRAVENOUS | Status: DC | PRN
  Administered 2016-01-23 (×2): via INTRAVENOUS

## 2016-01-23 MED ORDER — ROPIVACAINE HCL 5 MG/ML IJ SOLN
INTRAMUSCULAR | Status: DC | PRN
  Administered 2016-01-23: 30 mL via PERINEURAL

## 2016-01-23 MED ORDER — PANTOPRAZOLE SODIUM 40 MG PO TBEC
80.0000 mg | DELAYED_RELEASE_TABLET | Freq: Every day | ORAL | Status: DC
  Administered 2016-01-23 – 2016-01-25 (×3): 80 mg via ORAL
  Filled 2016-01-23 (×3): qty 2

## 2016-01-23 SURGICAL SUPPLY — 56 items
BANDAGE ESMARK 6X9 LF (GAUZE/BANDAGES/DRESSINGS) ×1 IMPLANT
BLADE SAGITTAL 25.0X1.19X90 (BLADE) ×2 IMPLANT
BNDG ESMARK 6X9 LF (GAUZE/BANDAGES/DRESSINGS) ×2
BOWL SMART MIX CTS (DISPOSABLE) ×2 IMPLANT
CAP KNEE TOTAL 3 SIGMA ×2 IMPLANT
CEMENT HV SMART SET (Cement) ×4 IMPLANT
COVER SURGICAL LIGHT HANDLE (MISCELLANEOUS) ×2 IMPLANT
CUFF TOURNIQUET SINGLE 34IN LL (TOURNIQUET CUFF) ×2 IMPLANT
CUFF TOURNIQUET SINGLE 44IN (TOURNIQUET CUFF) IMPLANT
DRAPE EXTREMITY T 121X128X90 (DRAPE) ×2 IMPLANT
DRAPE PROXIMA HALF (DRAPES) ×2 IMPLANT
DRSG ADAPTIC 3X8 NADH LF (GAUZE/BANDAGES/DRESSINGS) ×2 IMPLANT
DRSG PAD ABDOMINAL 8X10 ST (GAUZE/BANDAGES/DRESSINGS) ×2 IMPLANT
DURAPREP 26ML APPLICATOR (WOUND CARE) ×4 IMPLANT
ELECT CAUTERY BLADE 6.4 (BLADE) ×2 IMPLANT
ELECT REM PT RETURN 9FT ADLT (ELECTROSURGICAL) ×2
ELECTRODE REM PT RTRN 9FT ADLT (ELECTROSURGICAL) ×1 IMPLANT
EVACUATOR 1/8 PVC DRAIN (DRAIN) ×2 IMPLANT
FACESHIELD WRAPAROUND (MASK) ×4 IMPLANT
GAUZE SPONGE 4X4 12PLY STRL (GAUZE/BANDAGES/DRESSINGS) ×2 IMPLANT
GLOVE BIOGEL PI IND STRL 8 (GLOVE) ×1 IMPLANT
GLOVE BIOGEL PI IND STRL 8.5 (GLOVE) ×1 IMPLANT
GLOVE BIOGEL PI INDICATOR 8 (GLOVE) ×1
GLOVE BIOGEL PI INDICATOR 8.5 (GLOVE) ×1
GLOVE ECLIPSE 8.0 STRL XLNG CF (GLOVE) ×4 IMPLANT
GLOVE SURG ORTHO 8.5 STRL (GLOVE) ×4 IMPLANT
GOWN STRL REUS W/ TWL LRG LVL3 (GOWN DISPOSABLE) ×2 IMPLANT
GOWN STRL REUS W/TWL 2XL LVL3 (GOWN DISPOSABLE) ×2 IMPLANT
GOWN STRL REUS W/TWL LRG LVL3 (GOWN DISPOSABLE) ×2
HANDPIECE INTERPULSE COAX TIP (DISPOSABLE) ×1
KIT BASIN OR (CUSTOM PROCEDURE TRAY) ×2 IMPLANT
KIT ROOM TURNOVER OR (KITS) ×2 IMPLANT
MANIFOLD NEPTUNE II (INSTRUMENTS) ×2 IMPLANT
NEEDLE 22X1 1/2 (OR ONLY) (NEEDLE) ×2 IMPLANT
NS IRRIG 1000ML POUR BTL (IV SOLUTION) ×2 IMPLANT
PACK TOTAL JOINT (CUSTOM PROCEDURE TRAY) ×2 IMPLANT
PAD ARMBOARD 7.5X6 YLW CONV (MISCELLANEOUS) ×4 IMPLANT
PAD CAST 4YDX4 CTTN HI CHSV (CAST SUPPLIES) ×1 IMPLANT
PADDING CAST COTTON 4X4 STRL (CAST SUPPLIES) ×1
PADDING CAST COTTON 6X4 STRL (CAST SUPPLIES) ×2 IMPLANT
SET HNDPC FAN SPRY TIP SCT (DISPOSABLE) ×1 IMPLANT
STAPLER VISISTAT 35W (STAPLE) IMPLANT
SUCTION FRAZIER HANDLE 10FR (MISCELLANEOUS) ×1
SUCTION TUBE FRAZIER 10FR DISP (MISCELLANEOUS) ×1 IMPLANT
SURGIFLO W/THROMBIN 8M KIT (HEMOSTASIS) IMPLANT
SUT BONE WAX W31G (SUTURE) ×2 IMPLANT
SUT ETHIBOND NAB CT1 #1 30IN (SUTURE) ×4 IMPLANT
SUT MNCRL AB 3-0 PS2 18 (SUTURE) ×2 IMPLANT
SUT VIC AB 0 CT1 27 (SUTURE) ×1
SUT VIC AB 0 CT1 27XBRD ANBCTR (SUTURE) ×1 IMPLANT
SYR CONTROL 10ML LL (SYRINGE) ×2 IMPLANT
TOWEL OR 17X24 6PK STRL BLUE (TOWEL DISPOSABLE) ×2 IMPLANT
TOWEL OR 17X26 10 PK STRL BLUE (TOWEL DISPOSABLE) ×2 IMPLANT
TRAY FOLEY CATH 16FRSI W/METER (SET/KITS/TRAYS/PACK) ×2 IMPLANT
WATER STERILE IRR 1000ML POUR (IV SOLUTION) IMPLANT
WRAP KNEE MAXI GEL POST OP (GAUZE/BANDAGES/DRESSINGS) ×2 IMPLANT

## 2016-01-23 NOTE — Progress Notes (Signed)
Utilization review completed.  

## 2016-01-23 NOTE — Op Note (Signed)
PATIENT ID:      Stacy Garcia  MRN:     [id number] DOB/AGE:    [birth year] / 65 y.o.       OPERATIVE REPORT    DATE OF PROCEDURE:  01/23/2016       PREOPERATIVE DIAGNOSIS:END STAGE   RIGHT KNEE OSTEOARTHRITIS                                                       Estimated body mass index is 22.8 kg/(m^2) as calculated from the following:   Height as of 01/12/16: 5\' 5"  (1.651 m).   Weight as of this encounter: 62.143 kg (137 lb).     POSTOPERATIVE DIAGNOSIS:   RIGHT KNEE OSTEOARTHRITIS-SAME                                                                     Estimated body mass index is 22.8 kg/(m^2) as calculated from the following:   Height as of 01/12/16: 5\' 5"  (1.651 m).   Weight as of this encounter: 62.143 kg (137 lb).     PROCEDURE:  Procedure(s):RIGHTTOTAL KNEE ARTHROPLASTY      SURGEON:  [name], MD    ASSISTANT:   Biagio Borg, PA-C   (Present and scrubbed throughout the case, critical for assistance with exposure, retraction, instrumentation, and closure.)          ANESTHESIA: regional and spinal     DRAINS: (RIGHT KNEE) Hemovact drain(s) in the CLAMPED with  Suction Clamped :      TOURNIQUET TIME:  Total Tourniquet Time Documented: Thigh (Right) - 64 minutes Total: Thigh (Right) - 64 minutes     COMPLICATIONS:  None   CONDITION:  stable  PROCEDURE IN DETAIL: [id number]   Stacy Garcia W 01/23/2016, 9:10 AM

## 2016-01-23 NOTE — Evaluation (Signed)
Physical Therapy Evaluation Patient Details Name: Stacy Garcia MRN: [id number] DOB: Feb 21, 1951 Today's Date: 01/23/2016   History of Present Illness  Pt is a 65 y/o F s/p Rt TKA.  Pt's PMH includes vertigo, COPD, tobacco abuse, hypothyroidism.  Clinical Impression  Pt is s/p Rt TKA resulting in the deficits listed below (see PT Problem List). Stacy Garcia was very pleasant to work.  She required mod assist for safe pivot to chair due to Rt knee buckle as nerve block had not yet worn off.  Anticipate that she will progress well w/ mobility once this does occur.  Her sister will be staying w/ her at d/c.  Pt will benefit from skilled PT to increase their independence and safety with mobility to allow discharge to the venue listed below.     Follow Up Recommendations Home health PT;Supervision for mobility/OOB    Equipment Recommendations  3in1 (PT)    Recommendations for Other Services OT consult     Precautions / Restrictions Precautions Precautions: Fall Precaution Comments: reviewed now pillow under knee and to maintain Rt knee extension Restrictions Weight Bearing Restrictions: Yes RLE Weight Bearing: Partial weight bearing RLE Partial Weight Bearing Percentage or Pounds: 50%      Mobility  Bed Mobility Overal bed mobility: Needs Assistance Bed Mobility: Supine to Sit     Supine to sit: Min guard;HOB elevated     General bed mobility comments: HOB elevated and pt uses bed rail w/ increased time.  Transfers Overall transfer level: Needs assistance Equipment used: Rolling walker (2 wheeled) Transfers: Sit to/from [facility] Sit to Stand: Min assist Stand pivot transfers: Mod assist       General transfer comment: Assist to steady to stand and cues for technique.  Rt knee buckle during pivot, pt relying heavily on RW and Lt LE for support as well as assist.    Ambulation/Gait             General Gait Details: Weight shifting at bedside, practicing  50% PWB.  Unable to ambulate today as nerve block has not yet worn off  Stairs            Wheelchair Mobility    Modified Rankin (Stroke Patients Only)       Balance Overall balance assessment: Needs assistance Sitting-balance support: No upper extremity supported;Feet supported Sitting balance-Leahy Scale: Good     Standing balance support: Bilateral upper extremity supported;During functional activity Standing balance-Leahy Scale: Poor Standing balance comment: RW and physical assist for support                             Pertinent Vitals/Pain Pain Assessment: 0-10 Pain Score: 3  Pain Location: Rt knee Pain Descriptors / Indicators: Aching;Discomfort;Grimacing;Guarding Pain Intervention(s): Limited activity within patient's tolerance;Monitored during session;Repositioned    Home Living Family/patient expects to be discharged to:: Private residence Living Arrangements: Spouse/significant other;Other relatives Available Help at Discharge: Family;Available 24 hours/day (sister to stay w/ pt at d/c) Type of Home: House Home Access: Stairs to enter Entrance Stairs-Rails: None Entrance Stairs-Number of Steps: 1 Home Layout: Multi-level;Laundry or work area in [place]: Environmental consultant - 2 wheels;Walker - 4 wheels;Crutches (rollator) Additional Comments: When you enter the pt's house you are in the foyer and can either go down 6 steps to the basement (pt rarely goes here) or up 6 steps to the main floor where her bedroom and bathroom are.    Prior Function  Level of Independence: Independent               Hand Dominance        Extremity/Trunk Assessment   Upper Extremity Assessment: Defer to OT evaluation           Lower Extremity Assessment: RLE deficits/detail RLE Deficits / Details: limited ROM and strength as expected s/p Rt TKA       Communication   Communication: No difficulties  Cognition Arousal/Alertness:  Awake/alert Behavior During Therapy: WFL for tasks assessed/performed Overall Cognitive Status: Within Functional Limits for tasks assessed                      General Comments General comments (skin integrity, edema, etc.): Pt c/o h/o vertigo.  Onset when rolling to her left in bed.  Has seen neurologist w/ no solution or specific dx.      Exercises Total Joint Exercises Quad Sets: Strengthening;Both;10 reps;Seated Long Arc Quad: AAROM;Right;5 reps;Seated Knee Flexion: AROM;Right;5 reps;Other (comment);Seated (w/ 5 second holds.  Assist for proper alignment, pt IR hip.) Goniometric ROM: ~100 deg Rt knee flexion AROM      Assessment/Plan    PT Assessment Patient needs continued PT services  PT Diagnosis Difficulty walking;Abnormality of gait;Acute pain   PT Problem List Decreased strength;Decreased range of motion;Decreased activity tolerance;Decreased balance;Decreased mobility;Decreased knowledge of use of DME;Decreased safety awareness;Decreased knowledge of precautions;Pain;Impaired sensation  PT Treatment Interventions DME instruction;Gait training;Stair training;Functional mobility training;Therapeutic activities;Therapeutic exercise;Balance training;Neuromuscular re-education;Patient/family education;Modalities   PT Goals (Current goals can be found in the Care Plan section) Acute Rehab PT Goals Patient Stated Goal: decreased pain; to go home PT Goal Formulation: With patient Time For Goal Achievement: 01/30/16 Potential to Achieve Goals: Good    Frequency 7X/week   Barriers to discharge Inaccessible home environment steps to enter home and steps inside home    Co-evaluation               End of Session Equipment Utilized During Treatment: Gait belt Activity Tolerance: Patient limited by pain;Other (comment) (limited by residual effects of nerve block) Patient left: in chair;with call bell/phone within reach;with chair alarm set;with family/visitor  present Nurse Communication: Mobility status;Other (comment);Weight bearing status (Rt knee buckle)         Time: [id number] PT Time Calculation (min) (ACUTE ONLY): 26 min   Charges:   PT Evaluation $PT Eval Moderate Complexity: 1 Procedure PT Treatments $Therapeutic Exercise: 8-22 mins   PT G Codes:       Stacy Garcia PT, DPT  Pager: [phone] Phone: [phone] 01/23/2016, 4:58 PM

## 2016-01-23 NOTE — Progress Notes (Signed)
Patient ID: Stacy Garcia, female   DOB: 1951-07-13, 65 y.o.   MRN: [id number] The recent History & Physical has been reviewed. I have personally examined the patient today. There is no interval change to the documented History & Physical. The patient would like to proceed with the procedure.  [name] W 01/23/2016,  7:11 AM

## 2016-01-23 NOTE — Progress Notes (Signed)
Orthopedic Tech Progress Note Patient Details:  Stacy Garcia [birth year] [id number]  CPM Right Knee CPM Right Knee: On Right Knee Flexion (Degrees): 90 Right Knee Extension (Degrees): 0 Additional Comments: cpm   [name] 01/23/2016, 2:57 PM

## 2016-01-23 NOTE — Anesthesia Postprocedure Evaluation (Signed)
Anesthesia Post Note  Patient: Stacy Garcia  Procedure(s) Performed: Procedure(s) (LRB): TOTAL KNEE ARTHROPLASTY (Right)  Patient location during evaluation: PACU Anesthesia Type: Spinal, MAC and Regional Level of consciousness: awake and alert Pain management: pain level controlled Vital Signs Assessment: post-procedure vital signs reviewed and stable Respiratory status: spontaneous breathing and respiratory function stable Cardiovascular status: blood pressure returned to baseline and stable Postop Assessment: spinal receding Anesthetic complications: no    Last Vitals:  Filed Vitals:   01/23/16 1000 01/23/16 1013  BP:    Pulse: 68   Temp:  36.4 C  Resp: 14     Last Pain:  Filed Vitals:   01/23/16 1015  PainSc: 0-No pain    LLE Motor Response: Purposeful movement (move toes) (01/23/16 1013)   RLE Motor Response: Purposeful movement (move toes) (01/23/16 1013)   L Sensory Level: S1-Sole of foot, small toes (01/23/16 1013) R Sensory Level: S1-Sole of foot, small toes (01/23/16 1013)  Sallyann Kinnaird,W. EDMOND

## 2016-01-23 NOTE — Progress Notes (Signed)
Orthopedic Tech Progress Note Patient Details:  Stacy Garcia September 24, 1950 [id number] Ortho visit put on cpm at 1950 Patient ID: Stacy Garcia, female   DOB: 1950-11-15, 65 y.o.   MRN: [id number]   Braulio Bosch 01/23/2016, 7:48 PM

## 2016-01-23 NOTE — Transfer of Care (Signed)
Immediate Anesthesia Transfer of Care Note  Patient: Stacy Garcia  Procedure(s) Performed: Procedure(s): TOTAL KNEE ARTHROPLASTY (Right)  Patient Location: PACU  Anesthesia Type:MAC, Regional and Spinal  Level of Consciousness: awake, alert , oriented and patient cooperative  Airway & Oxygen Therapy: Patient Spontanous Breathing and Patient connected to nasal cannula oxygen  Post-op Assessment: Report given to RN and Post -op Vital signs reviewed and stable  Post vital signs: Reviewed and stable  Last Vitals:  Filed Vitals:   01/23/16 0548  BP: 130/72  Pulse: 69  Temp: 36.4 C  Resp: 20    Last Pain:  Filed Vitals:   01/23/16 0602  PainSc: 1       Patients Stated Pain Goal: 3 ([id number])  Complications: No apparent anesthesia complications

## 2016-01-23 NOTE — Anesthesia Procedure Notes (Addendum)
Anesthesia Regional Block:  Femoral nerve block  Pre-Anesthetic Checklist: ,, timeout performed, Correct Patient, Correct Site, Correct Laterality, Correct Procedure,, site marked, risks and benefits discussed, Surgical consent,  Pre-op evaluation,  At surgeon's request and post-op pain management  Laterality: Right  Prep: chloraprep       Needles:  Injection technique: Single-shot  Needle Type: Echogenic Stimulator Needle     Needle Length: 9cm 9 cm Needle Gauge: 21 and 21 G    Additional Needles:  Procedures: nerve stimulator Femoral nerve block  Nerve Stimulator or Paresthesia:  Response: Quadriceps muscle contraction, 0.45 mA,   Additional Responses:   Narrative:  Start time: 01/23/2016 6:50 AM End time: 01/23/2016 7:00 AM Injection made incrementally with aspirations every 5 mL.  Performed by: Personally  Anesthesiologist: HODIERNE, ADAM  Additional Notes: Functioning IV was confirmed and monitors were applied.  A 52mm 21ga Arrow echogenic stimulator needle was used. Sterile prep and drape,hand hygiene and sterile gloves were used.  Negative aspiration and negative test dose prior to incremental administration of local anesthetic. The patient tolerated the procedure well.     Spinal Patient location during procedure: OR Start time: 01/23/2016 7:20 AM End time: 01/23/2016 7:29 AM Staffing Anesthesiologist: Roderic Palau Performed by: anesthesiologist  Preanesthetic Checklist Completed: patient identified, surgical consent, pre-op evaluation, timeout performed, IV checked, risks and benefits discussed and monitors and equipment checked Spinal Block Patient position: sitting Prep: Betadine Patient monitoring: cardiac monitor, continuous pulse ox and blood pressure Approach: midline Location: L3-4 Injection technique: single-shot Needle Needle type: Pencan  Needle gauge: 24 G Needle length: 9 cm Assessment Sensory level: T8 Additional  Notes Functioning IV was confirmed and monitors were applied. Sterile prep and drape, including hand hygiene and sterile gloves were used. The patient was positioned and the spine was prepped. The skin was anesthetized with lidocaine.  Free flow of clear CSF was obtained prior to injecting local anesthetic into the CSF.  The spinal needle aspirated freely following injection.  The needle was carefully withdrawn.  The patient tolerated the procedure well.

## 2016-01-23 NOTE — Op Note (Signed)
NAMEMarland Kitchen  RAEVIN, WIERENGA NO.:  [id number]  MEDICAL RECORD NO.:  [id number]  LOCATION:  5N24C                        FACILITY:  [facility]  PHYSICIAN:  Vonna Kotyk. Naithan Delage, M.D.DATE OF BIRTH:  08/16/1951  DATE OF PROCEDURE:  01/23/2016 DATE OF DISCHARGE:                              OPERATIVE REPORT   PREOPERATIVE DIAGNOSIS:  End-stage osteoarthritis, right knee.  POSTOPERATIVE DIAGNOSIS:  End-stage osteoarthritis, right knee.  PROCEDURE:  Right total knee replacement.  SURGEON:  Vonna Kotyk. Durward Fortes, M.D.  ASSISTANT:  Aaron Edelman D. Petrarca, P.A.-C.  He was present throughout the operative procedure to ensure its timely completion.  ANESTHESIA:  Spinal with femoral nerve block.  COMPLICATIONS:  None.  COMPONENTS:  DePuy LCS standard femoral component, a #2.5 tibial tray with a 10-mm polyethylene bridging bearing, a metal back 3 peg rotating patella.  All components were secured with polymethyl methacrylate.  DESCRIPTION OF PROCEDURE:  Ms. Stacy Garcia was met in the holding area.  I identified the right knee as appropriate operative site and marked it accordingly.  Anesthesia performed a femoral nerve block.  The patient was transported to room #7.  Spinal anesthesia was performed by anesthesia without difficulty.  The patient was then placed supine. A tourniquet was applied to the right thigh.  Nursing staff inserted a Foley catheter, urine was clear. The right lower extremity was then prepped with chlorhexidine scrub and DuraPrep and the tourniquet to the tips of the toes.  Sterile draping was performed.  Time-out was called.  The extremity was then elevated with Esmarch exsanguinated with proximal tourniquet at 325 mmHg.  A midline longitudinal incision was made centered about the patella extending from the superior pouch to tibial tubercle.  Via sharp dissection, incision was carried down to subcutaneous tissue.  First layer of capsule was incised in midline and  medial parapatellar incision was made through the deep capsule using the Bovie, the joint was entered.  There was a minimal clear yellow joint effusion.  Patella was everted 180 degrees laterally and the knee was then flexed 90 degrees. There was mild synovitis, synovectomy was performed.  There was complete absence of articular cartilage in the medial femoral condyle, medial tibial plateau.  When considerable, cartilage thinning laterally both in the femur and the tibia.  There were moderate osteophytes along the medial and lateral femoral condyles, these were removed and I sized to number a standard femoral component.  First, bony cuts were then made transversely in the proximal tibia with a 7-degree declination using the external tibial guide with each bony cut.  I measured I checked my alignment with the external jig. Subsequent cuts were then made on the femur using the standard femoral jig.  Flexion extension gaps were perfectly symmetrical at 10 mm. Lamina spreader was then inserted in the medial and lateral compartments.  I removed ACL and PCL and osteophytes in the posterior femoral condyles medially and laterally.  I removed a portion of the large Baker cyst identified medially.  MCL and LCL remained intact.  I did not see any loose bodies.  Distal femoral valgus cut was then made with 4 degrees.  The finishing cuts made for tapering purposes and  to obtain the center holes in the femoral condyles with the finishing guide.  Retractors were then placed about the tibia, was advanced anteriorly and measured 2.5 tibial tray.  This was pinned in place, center hole made followed by the keeled cut.  With the trial tibial jig in place, a 10 mm polyethylene bridging bearing was inserted as our flexion and extension gaps were symmetrical at 10 mm.  This was followed by the standard femoral component.  The entire construct was reduced through full range of motion and perfect stability  with varus and valgus stress, full extension and flexed well beyond 120 degrees without any malrotation of the tibial component.  The patella was then prepared by removing approximately 9 mm of bone leaving 13 mm patella thickness.  Three holes were then made.  Trial patella applied through full range of motion remained stable.  Trial components removed.  The joint was copiously irrigated with saline solution.  The final components were then impacted with polymethyl methacrylate. Initially applied the tibial tray followed by the 10 mm bridging bearing and the standard femoral component.  The joint components were impacted and then the knee placed in extension.  Any extraneous methacrylate was removed with a Valora Corporal.  Patella was applied with methacrylate and patellar clamp.  At approximately 16 minutes of methacrylate had matured during which time, he injected the joint with 0.25% Marcaine with epinephrine.  We again checked the components which were intact with excellent stability and alignment.  Tourniquet was deflated at 64 minutes.  Gross bleeders were Bovie coagulated.  We placed topical tranexamic acid on the joint.  At that point, we inserted a medium-sized Hemovac to the lateral compartment.  The deep capsule was closed with running #1 Ethibond, superficial capsule closed with running 0 Vicryl and subcu with 2-0 Vicryl and 3-0 Monocryl. Skin closed with skin clips.  Sterile bulky dressing was applied, followed by the patient's support stocking.  The patient tolerated the procedure well without complications.     Vonna Kotyk. Durward Fortes, M.D.     PWW/MEDQ  D:  01/23/2016  T:  01/23/2016  Job:  [id number]

## 2016-01-23 NOTE — Progress Notes (Signed)
Orthopedic Tech Progress Note Patient Details:  Stacy Garcia 1950/09/25 [id number]  CPM Right Knee CPM Right Knee: On Right Knee Flexion (Degrees): 90 Right Knee Extension (Degrees): 0 Additional Comments: Trapeze bar    [name] 01/23/2016, 10:15 AM

## 2016-01-24 ENCOUNTER — Encounter: Payer: Commercial Managed Care - HMO | Admitting: Adult Health

## 2016-01-24 ENCOUNTER — Encounter (HOSPITAL_COMMUNITY): Payer: Self-pay | Admitting: Orthopaedic Surgery

## 2016-01-24 LAB — CBC
HCT: 39.2 % (ref 36.0–46.0)
Hemoglobin: 12.7 g/dL (ref 12.0–15.0)
MCH: 31.1 pg (ref 26.0–34.0)
MCHC: 32.4 g/dL (ref 30.0–36.0)
MCV: 96.1 fL (ref 78.0–100.0)
PLATELETS: 179 10*3/uL (ref 150–400)
RBC: 4.08 MIL/uL (ref 3.87–5.11)
RDW: 13.6 % (ref 11.5–15.5)
WBC: 7.9 10*3/uL (ref 4.0–10.5)

## 2016-01-24 LAB — BASIC METABOLIC PANEL
ANION GAP: 9 (ref 5–15)
BUN: 9 mg/dL (ref 6–20)
CALCIUM: 8.4 mg/dL — AB (ref 8.9–10.3)
CO2: 27 mmol/L (ref 22–32)
Chloride: 104 mmol/L (ref 101–111)
Creatinine, Ser: 0.89 mg/dL (ref 0.44–1.00)
GLUCOSE: 105 mg/dL — AB (ref 65–99)
POTASSIUM: 4 mmol/L (ref 3.5–5.1)
Sodium: 140 mmol/L (ref 135–145)

## 2016-01-24 MED ORDER — ACETAMINOPHEN 500 MG PO TABS
1000.0000 mg | ORAL_TABLET | Freq: Once | ORAL | Status: AC
  Administered 2016-01-24: 1000 mg via ORAL
  Filled 2016-01-24: qty 2

## 2016-01-24 MED ORDER — ACETAMINOPHEN 500 MG PO TABS
1000.0000 mg | ORAL_TABLET | Freq: Four times a day (QID) | ORAL | Status: DC
  Administered 2016-01-24 – 2016-01-25 (×3): 1000 mg via ORAL
  Filled 2016-01-24 (×3): qty 2

## 2016-01-24 NOTE — Progress Notes (Signed)
Physical Therapy Treatment Patient Details Name: Stacy Garcia MRN: [id number] DOB: 08-04-1951 Today's Date: 01/24/2016    History of Present Illness Pt is a 65 y/o F s/p Rt TKA.  Pt's PMH includes vertigo, COPD, tobacco abuse, hypothyroidism.    PT Comments    Pt very motivated for ambulation, but does indicate increased pain today.  Pt making great progress and indicates she anticipates D/C tomorrow.  Feel pt is on track for D/C tomorrow.    Follow Up Recommendations  Home health PT;Supervision for mobility/OOB     Equipment Recommendations  3in1 (PT)    Recommendations for Other Services       Precautions / Restrictions Precautions Precautions: Fall Restrictions Weight Bearing Restrictions: Yes RLE Weight Bearing: Partial weight bearing RLE Partial Weight Bearing Percentage or Pounds: 50%    Mobility  Bed Mobility Overal bed mobility: Needs Assistance Bed Mobility: Supine to Sit     Supine to sit: Supervision     General bed mobility comments: pt with increased effort, but able to complete without A.  Transfers Overall transfer level: Needs assistance Equipment used: Rolling walker (2 wheeled) Transfers: Sit to/from Stand Sit to Stand: Min guard         General transfer comment: pt maintains R LE in more of TDWB during transfers.  pt demonstrates good use of UEs.  Ambulation/Gait Ambulation/Gait assistance: Min guard Ambulation Distance (Feet): 160 Feet Assistive device: Rolling walker (2 wheeled) Gait Pattern/deviations: Step-to pattern;Decreased stride length;Decreased stance time - right;Decreased step length - left     General Gait Details: pt able to bear weight on R LE, though does indicate increased pain.     Stairs            Wheelchair Mobility    Modified Rankin (Stroke Patients Only)       Balance Overall balance assessment: Needs assistance Sitting-balance support: No upper extremity supported;Feet supported Sitting  balance-Leahy Scale: Good     Standing balance support: Bilateral upper extremity supported;During functional activity Standing balance-Leahy Scale: Poor                      Cognition Arousal/Alertness: Awake/alert Behavior During Therapy: WFL for tasks assessed/performed Overall Cognitive Status: Within Functional Limits for tasks assessed                      Exercises Total Joint Exercises Ankle Circles/Pumps: AROM;Both;10 reps Quad Sets: AROM;Both;10 reps Hip ABduction/ADduction: AROM;Right;10 reps Long Arc Quad: AROM;Both;10 reps Knee Flexion: AROM;Right;10 reps Goniometric ROM: ~90 and pt indicates increased pain today.    General Comments        Pertinent Vitals/Pain Pain Assessment: 0-10 Pain Score: 7  Pain Location: R Knee Pain Descriptors / Indicators: Aching;Grimacing;Guarding Pain Intervention(s): Limited activity within patient's tolerance;Monitored during session;Repositioned;Patient requesting pain meds-RN notified    Home Living                      Prior Function            PT Goals (current goals can now be found in the care plan section) Acute Rehab PT Goals Patient Stated Goal: decreased pain; to go home PT Goal Formulation: With patient Time For Goal Achievement: 01/30/16 Potential to Achieve Goals: Good Progress towards PT goals: Progressing toward goals    Frequency  7X/week    PT Plan Current plan remains appropriate    Co-evaluation  End of Session Equipment Utilized During Treatment: Gait belt Activity Tolerance: Patient limited by pain Patient left: in chair;with call bell/phone within reach;with chair alarm set     Time: [phone] PT Time Calculation (min) (ACUTE ONLY): 30 min  Charges:  $Gait Training: 8-22 mins $Therapeutic Exercise: 8-22 mins                    G CodesCatarina Hartshorn, [place] 01/24/2016, 10:48 AM

## 2016-01-24 NOTE — Progress Notes (Signed)
Orthopedic Tech Progress Note Patient Details:  Stacy Garcia August 28, 1951 [id number]  Patient ID: [name], female   DOB: [birth year], 65 y.o.   MRN: [id number] Applied cpm 0-60  Karolee Stamps 01/24/2016, 5:46 AM

## 2016-01-24 NOTE — Progress Notes (Signed)
Orthopedic Tech Progress Note Patient Details:  Stacy Garcia March 23, 1951 [id number] Ortho visit put on cpm at [facility] [place] Patient ID: Stacy Garcia, female   DOB: 1951-03-09, 65 y.o.   MRN: [id number]   Stacy Garcia 01/24/2016, 6:25 PM

## 2016-01-24 NOTE — Discharge Instructions (Addendum)
Information on my medicine - XARELTO (Rivaroxaban)  This medication education was reviewed with me or my healthcare representative as part of my discharge preparation.  The pharmacist that spoke with me during my hospital stay was:  Duayne Cal, [facility]  Why was Xarelto prescribed for you? Xarelto was prescribed for you to reduce the risk of blood clots forming after orthopedic surgery. The medical term for these abnormal blood clots is venous thromboembolism (VTE).  What do you need to know about xarelto ? Take your Xarelto ONCE DAILY at the same time every day. You may take it either with or without food.  If you have difficulty swallowing the tablet whole, you may crush it and mix in applesauce just prior to taking your dose.  Take Xarelto exactly as prescribed by your doctor and DO NOT stop taking Xarelto without talking to the doctor who prescribed the medication.  Stopping without other VTE prevention medication to take the place of Xarelto may increase your risk of developing a clot.  After discharge, you should have regular check-up appointments with your healthcare provider that is prescribing your Xarelto.    What do you do if you miss a dose? If you miss a dose, take it as soon as you remember on the same day then continue your regularly scheduled once daily regimen the next day. Do not take two doses of Xarelto on the same day.   Important Safety Information A possible side effect of Xarelto is bleeding. You should call your healthcare provider right away if you experience any of the following: ? Bleeding from an injury or your nose that does not stop. ? Unusual colored urine (red or dark brown) or unusual colored stools (red or black). ? Unusual bruising for unknown reasons. ? A serious fall or if you hit your head (even if there is no bleeding).  Some medicines may interact with Xarelto and might increase your risk of bleeding while on Xarelto. To help avoid  this, consult your healthcare provider or pharmacist prior to using any new prescription or non-prescription medications, including herbals, vitamins, non-steroidal anti-inflammatory drugs (NSAIDs) and supplements.  This website has more information on Xarelto: [email or web address]-[email or web address]

## 2016-01-24 NOTE — Progress Notes (Signed)
Pt marked dressing had advanced and leaking serous drainage reinforced it  and checked vital signs WNL and charted in epic. Ice packs placed around the wound and Hemovac was charged no drainage was in the vac. Will continue to monitor. Arthor Captain LPN

## 2016-01-24 NOTE — Progress Notes (Signed)
PT Cancellation Note  Patient Details Name: Stacy Garcia MRN: [id number] DOB: 05/10/1951   Cancelled Treatment:    Reason Eval/Treat Not Completed: Pain limiting ability to participate.  Pt indicates she is currently too painful for any mobility at this time.  Pt's husband indicates pt just received her pain meds and a valium ~78mins prior to PT arrival.  Will try back another time.     Catarina Hartshorn, [place] 01/24/2016, 2:08 PM

## 2016-01-24 NOTE — Progress Notes (Signed)
Patient ID: Stacy Garcia, female   DOB: 1951-03-10, 65 y.o.   MRN: [id number] PATIENT ID: Stacy Garcia        MRN:  [id number]          DOB/AGE: 1951/07/24 / 65 y.o.    Stacy Fears, MD   Biagio Borg, PA-C [address] [place], [place]  [address]                             [phone]   PROGRESS NOTE  Subjective:  negative for Chest Pain  negative for Shortness of Breath  negative for Nausea/Vomiting   negative for Calf Pain    Tolerating Diet: yes         Patient reports pain as mild and moderate.     No real complaints  Objective: Vital signs in last 24 hours:    Patient Vitals for the past 24 hrs:  BP Temp Temp src Pulse Resp SpO2  01/24/16 0500 93/61 mmHg 98 F (36.7 C) Oral 74 16 96 %  01/24/16 0025 107/63 mmHg 98.3 F (36.8 C) - - - 96 %  01/23/16 2035 100/64 mmHg 98.8 F (37.1 C) Oral 79 16 96 %  01/23/16 1015 (!) 113/55 mmHg 97.5 F (36.4 C) - - 15 98 %  01/23/16 1013 (!) 101/57 mmHg 97.5 F (36.4 C) - - - -  01/23/16 1012 - - - 69 15 97 %  01/23/16 1000 - - - 68 14 97 %  01/23/16 0958 (!) 103/57 mmHg - - 70 15 97 %  01/23/16 0945 - - - 70 15 98 %  01/23/16 0943 111/85 mmHg - - 71 (!) 21 98 %  01/23/16 0930 - 97.5 F (36.4 C) - 70 14 97 %  01/23/16 0928 109/76 mmHg - - 76 11 96 %      Intake/Output from previous day:   05/16 0701 - 05/17 0700 In: 2440 [P.O.:240; I.V.:1900] Out: 2165 [Urine:1700; Drains:390]   Intake/Output this shift:       Intake/Output      05/16 0701 - 05/17 0700 05/17 0701 - 05/18 0700   P.O. 240    I.V. (mL/kg) 1900 (30.6)    IV Piggyback 300    Total Intake(mL/kg) 2440 (39.3)    Urine (mL/kg/hr) 1700 (1.1)    Drains 390 (0.3)    Blood 75 (0.1)    Total Output 2165     Net +275             LABORATORY DATA:    Recent Labs  01/24/16 0647  WBC 7.9  HGB 12.7  HCT 39.2  PLT 179   No results for input(s): NA, K, CL, CO2, BUN, CREATININE, GLUCOSE, CALCIUM in the last 168 hours. Lab Results  Component  Value Date   INR 1.02 01/12/2016    Recent Radiographic Studies :  No results found.   Examination:  General appearance: alert, cooperative, mild distress and moderate distress Resp: clear to auscultation bilaterally Cardio: regular rate and rhythm GI: normal findings: bowel sounds normal  Wound Exam: clean, dry, intact dressing  Drainage:  None: wound tissue dry  Motor Exam: EHL, FHL, Anterior Tibial and Posterior Tibial Intact  Sensory Exam: Superficial Peroneal, Deep Peroneal and Tibial normal  Vascular Exam: Right posterior tibial artery has 1+ (weak) pulse  Assessment:    1 Day Post-Op  Procedure(s) (LRB): TOTAL KNEE ARTHROPLASTY (Right)  ADDITIONAL DIAGNOSIS:  Principal  Problem:   Primary osteoarthritis of right knee Active Problems:   S/P total knee replacement using cement     Plan: Physical Therapy as ordered 50% WBAT  DVT Prophylaxis:  Xarelto, Foot Pumps and TED hose  DISCHARGE PLAN: Home  DISCHARGE NEEDS: HHPT, CPM, Walker and 3-in-1 comode seat        PETRARCA,BRIAN  01/24/2016 8:01 AM

## 2016-01-24 NOTE — Progress Notes (Signed)
Occupational Therapy Evaluation Patient Details Name: Stacy Garcia MRN: [id number] DOB: [birth year] Today's Date: 01/24/2016    History of Present Illness Pt is a 65 y/o F s/p Rt TKA.  Pt's PMH includes vertigo, COPD, tobacco abuse, hypothyroidism.   Clinical Impression   PTA, pt independent with ADL and mobility. Pt significantly limited by pain this pm (10/10). Will address tub transfers and compensatory techniques for LB ADL to facilitate safe D/C home.    Follow Up Recommendations  No OT follow up;Supervision - Intermittent    Equipment Recommendations  3 in 1 bedside comode    Recommendations for Other Services       Precautions / Restrictions Precautions Precautions: Fall Precaution Comments: reviewed now pillow under knee and to maintain Rt knee extension Restrictions Weight Bearing Restrictions: Yes RLE Weight Bearing: Partial weight bearing RLE Partial Weight Bearing Percentage or Pounds: 50%      Mobility Bed Mobility Overal bed mobility: Needs Assistance Bed Mobility: Supine to Sit;Sit to Supine     Supine to sit: Supervision Sit to supine: Supervision   General bed mobility comments: HOB elevated and pt uses bed rail w/ increased time.  Transfers Overall transfer level: Needs assistance Equipment used: Rolling walker (2 wheeled) Transfers: Sit to/from Stand Sit to Stand: Min assist Stand pivot transfers: Min assist       General transfer comment:  Pt impulsive during trnasfers. Feel likely to pain.    Balance Overall balance assessment: Needs assistance Sitting-balance support: No upper extremity supported;Feet supported Sitting balance-Leahy Scale: Good     Standing balance support: Bilateral upper extremity supported;During functional activity Standing balance-Leahy Scale: Poor                              ADL Overall ADL's : Needs assistance/impaired     Grooming: Set up   Upper Body Bathing: Set up;Sitting   Lower  Body Bathing: Minimal assistance;Sit to/from stand   Upper Body Dressing : Set up;Sitting   Lower Body Dressing: Minimal assistance;Sit to/from stand   Toilet Transfer: Min guard;BSC;Stand-pivot   Toileting- Water quality scientist and Hygiene: Set up       Functional mobility during ADLs: Minimal assistance;Cueing for safety General ADL Comments: Limited by pain. Pt requesting for pt to take her home so "she could die". Husband states he goes to work Sunday but her sister will be at home with her.      Vision     Perception     Praxis      Pertinent Vitals/Pain Pain Assessment: 0-10 Pain Score: 10-Worst pain ever Pain Location: r knee Pain Descriptors / Indicators: Aching;Crushing;Moaning;Throbbing Pain Intervention(s): Limited activity within patient's tolerance;Repositioned;Ice applied;Patient requesting pain meds-RN notified     Hand Dominance     Extremity/Trunk Assessment Upper Extremity Assessment Upper Extremity Assessment: Overall WFL for tasks assessed   Lower Extremity Assessment Lower Extremity Assessment: RLE deficits/detail;Defer to PT evaluation   Cervical / Trunk Assessment Cervical / Trunk Assessment: Normal   Communication Communication Communication: No difficulties   Cognition Arousal/Alertness: Awake/alert Behavior During Therapy: WFL for tasks assessed/performed Overall Cognitive Status: Within Functional Limits for tasks assessed                     General Comments       Exercises Exercises: Total Joint     Shoulder Instructions      Home Living Family/patient expects to be discharged to:: Private  residence Living Arrangements: Spouse/significant other;Other relatives Available Help at Discharge: Family;Available 24 hours/day (sister to stay w/ pt at d/c) Type of Home: House Home Access: Stairs to enter [employer] of Steps: 1 Entrance Stairs-Rails: None Home Layout: Multi-level;Laundry or work area in  basement Alternate Therapist, sports of Steps: 6 Alternate Level Stairs-Rails: Left;Right;Can reach both Bathroom Shower/Tub: Tub/shower unit Shower/tub characteristics: Architectural technologist: Standard ("lower than standard") Bathroom Accessibility: Yes How Accessible: Accessible via walker Home Equipment: [place] - 2 wheels;Walker - 4 wheels;Crutches (rollator)   Additional Comments: When you enter the pt's house you are in the foyer and can either go down 6 steps to the basement (pt rarely goes here) or up 6 steps to the main floor where her bedroom and bathroom are.      Prior Functioning/Environment Level of Independence: Independent             OT Diagnosis: Generalized weakness;Acute pain   OT Problem List: Decreased strength;Decreased range of motion;Decreased activity tolerance;Decreased knowledge of use of DME or AE;Decreased knowledge of precautions;Pain   OT Treatment/Interventions: Self-care/ADL training;DME and/or AE instruction;Therapeutic activities;Patient/family education    OT Goals(Current goals can be found in the care plan section) Acute Rehab OT Goals Patient Stated Goal: make pain go away OT Goal Formulation: With patient/family Time For Goal Achievement: 01/31/16 Potential to Achieve Goals: Good ADL Goals Pt Will Perform Lower Body Dressing: with set-up;with supervision;sit to/from stand;with caregiver independent in assisting Pt Will Perform Tub/Shower Transfer: with min guard assist;ambulating;3 in 1;Tub transfer;with caregiver independent in assisting;rolling walker Additional ADL Goal #1: Pt will verbalize understanding of 3 ways to reduce risk of falls at home.  OT Frequency: Min 2X/week   Barriers to D/C:            Co-evaluation              End of Session CPM Right Knee CPM Right Knee: Off Nurse Communication: Mobility status  Activity Tolerance: Patient limited by pain Patient left: in bed;with call bell/phone within  reach;with family/visitor present   Time: 1215-1238 OT Time Calculation (min): 23 min Charges:  OT General Charges $OT Visit: 1 Procedure OT Evaluation $OT Eval Moderate Complexity: 1 Procedure OT Treatments $Self Care/Home Management : 8-22 mins G-Codes:    Tyese Finken,HILLARY 01/28/2016, 1:18 PM   [facility], OTR/L  [phone] 2016/01/28

## 2016-01-24 NOTE — Care Management Note (Signed)
Case Management Note  Patient Details  Name: Stacy Garcia MRN: [id number] Date of Birth: 1951-08-29  Subjective/Objective:           S/p right total knee arthr oplasty       Action/Plan: Set up with [facility] for HHPT by MD office. Spoke with patient and husband and gave choice for [facility], she wishes to work with [place]. [employer] has delivered 3N1 and rolling walker to patient's room and will deliver CPM to home.Patient stated that her husband and sister will be assisting her after discharge.  Expected Discharge Date:                  Expected Discharge Plan:  [place]  In-House Referral:  NA  Discharge planning Services  CM Consult  Post Acute Care Choice:  Durable Medical Equipment, Home Health Choice offered to:  Patient  DME Arranged:  3-N-1, CPM, Walker rolling DME Agency:  [employer]  HH Arranged:  PT HH Agency:  [facility]  Status of Service:  Completed, signed off  Medicare Important Message Given:    Date Medicare IM Given:    Medicare IM give by:    Date Additional Medicare IM Given:    Additional Medicare Important Message give by:     If discussed at [place] of Stay Meetings, dates discussed:    Additional Comments:  Nila Nephew, RN 01/24/2016, 4:36 PM

## 2016-01-25 LAB — BASIC METABOLIC PANEL
Anion gap: 11 (ref 5–15)
BUN: 6 mg/dL (ref 6–20)
CALCIUM: 8.7 mg/dL — AB (ref 8.9–10.3)
CO2: 24 mmol/L (ref 22–32)
CREATININE: 0.66 mg/dL (ref 0.44–1.00)
Chloride: 101 mmol/L (ref 101–111)
GFR calc non Af Amer: >60 mL/min (ref >60)
Glucose, Bld: 141 mg/dL — ABNORMAL HIGH (ref 65–99)
Potassium: 3.6 mmol/L (ref 3.5–5.1)
SODIUM: 136 mmol/L (ref 135–145)

## 2016-01-25 LAB — CBC
HCT: 36.6 % (ref 36.0–46.0)
Hemoglobin: 12.6 g/dL (ref 12.0–15.0)
MCH: 32.8 pg (ref 26.0–34.0)
MCHC: 34.4 g/dL (ref 30.0–36.0)
MCV: 95.3 fL (ref 78.0–100.0)
Platelets: 204 10*3/uL (ref 150–400)
RBC: 3.84 MIL/uL — ABNORMAL LOW (ref 3.87–5.11)
RDW: 13.5 % (ref 11.5–15.5)
WBC: 12.5 10*3/uL — ABNORMAL HIGH (ref 4.0–10.5)

## 2016-01-25 MED ORDER — RIVAROXABAN 10 MG PO TABS: 10.0000 mg | ORAL_TABLET | Freq: Every day | ORAL | Status: DC

## 2016-01-25 MED ORDER — ACETAMINOPHEN 500 MG PO TABS: 1000.0000 mg | ORAL_TABLET | Freq: Four times a day (QID) | ORAL | Status: DC | PRN

## 2016-01-25 MED ORDER — OXYCODONE HCL 5 MG PO TABS: 5.0000 mg | ORAL_TABLET | ORAL | Status: DC | PRN

## 2016-01-25 NOTE — Progress Notes (Signed)
Md on call paged bloody stain on dressing has advanced pt c/o burning sensation vital signs taken and charted in Epic 10 mg Oxi IR given for pain awaiting MD return call. Arthor Captain LPN

## 2016-01-25 NOTE — Progress Notes (Signed)
Patient ID: Stacy Garcia, female   DOB: May 06, 1951, 65 y.o.   MRN: [id number] PATIENT ID: Stacy Garcia        MRN:  [id number]          DOB/AGE: Dec 08, 1950 / 65 y.o.    [name], MD   Biagio Borg, PA-C [address] [place], [place]  [address]                             [phone]   PROGRESS NOTE  Subjective:  negative for Chest Pain  negative for Shortness of Breath  negative for Nausea/Vomiting   negative for Calf Pain    Tolerating Diet: yes         Patient reports pain as 4 on 0-10 scale.     Comfortable at present-no complaints  Objective: Vital signs in last 24 hours:   Patient Vitals for the past 24 hrs:  BP Temp Temp src Pulse Resp SpO2  01/25/16 0600 140/70 mmHg 98.7 F (37.1 C) Axillary 97 - 96 %  01/25/16 0057 133/72 mmHg 98.9 F (37.2 C) Axillary 99 11 95 %  01/24/16 2254 131/71 mmHg 97.5 F (36.4 C) Oral 99 12 96 %  01/24/16 2050 123/62 mmHg 99.6 F (37.6 C) Oral 94 14 96 %  01/24/16 1326 (!) 158/93 mmHg 99.1 F (37.3 C) - 96 16 93 %      Intake/Output from previous day:   05/17 0701 - 05/18 0700 In: 1080 [P.O.:1080] Out: 82 [Urine:1; Drains:80]   Intake/Output this shift:       Intake/Output      05/17 0701 - 05/18 0700 05/18 0701 - 05/19 0700   P.O. 1080    I.V. (mL/kg)     IV Piggyback     Total Intake(mL/kg) 1080 (17.4)    Urine (mL/kg/hr) 1 (0)    Drains 80 (0.1)    Stool 1 (0)    Blood     Total Output 82     Net +998          Urine Occurrence 3 x       LABORATORY DATA:  Recent Labs  01/24/16 0647 01/25/16 0723  WBC 7.9 12.5*  HGB 12.7 12.6  HCT 39.2 36.6  PLT 179 204    Recent Labs  01/24/16 0647 01/25/16 0723  NA 140 136  K 4.0 3.6  CL 104 101  CO2 27 24  BUN 9 6  CREATININE 0.89 0.66  GLUCOSE 105* 141*  CALCIUM 8.4* 8.7*   Lab Results  Component Value Date   INR 1.02 01/12/2016    Recent Radiographic Studies :  No results found.   Examination:  General appearance: alert, cooperative  and no distress  Wound Exam: clean, dry, intact   Drainage:  None: wound tissue dry  Motor Exam: EHL, FHL, Anterior Tibial and Posterior Tibial Intact  Sensory Exam: Superficial Peroneal, Deep Peroneal and Tibial normal  Vascular Exam: Normal  Assessment:    2 Days Post-Op  Procedure(s) (LRB): TOTAL KNEE ARTHROPLASTY (Right)  ADDITIONAL DIAGNOSIS:  Principal Problem:   Primary osteoarthritis of right knee Active Problems:   S/P total knee replacement using cement     Plan: Physical Therapy as ordered Partial Weight Bearing @ 50% (PWB)  DVT Prophylaxis:  Xarelto, Foot Pumps and TED hose  DISCHARGE PLAN: Home  DISCHARGE NEEDS: HHPT, CPM, Walker and 3-in-1 comode seat   hemovac came out early  this am-some bleeding from drain insertion sight-dry now, wound clean and dry-no calf pain, voiding without difficulty, tolerating diet, good effort in PT-will plan D/C today with HHPT, dressing changed right knee     Garald Balding  01/25/2016 12:35 PM

## 2016-01-25 NOTE — Progress Notes (Signed)
Pt discharge education and instructions completed with pt and partner at bedside; both voices understanding and denied any questions. Pt IV removed; RLE incision dsg remains clean, dry and intact with no active bleeding or drainage. Pt discharge home with partner to transport her home. Pt handed her prescriptions for oxycodon, tylenol and xarelto. Pt transported off unit via wheelchair with partner and belongings to the side. Delia Heady RN

## 2016-01-25 NOTE — Progress Notes (Signed)
Md on call paged 2nd time to inform that pt is bleeding at surgical site awaiting response. Arthor Captain LPN

## 2016-01-25 NOTE — Progress Notes (Signed)
Occupational Therapy Treatment and Discharge Patient Details Name: Stacy Garcia MRN: [id number] DOB: 10-Sep-1950 Today's Date: 01/25/2016    History of present illness Pt is a 65 y/o F s/p Rt TKA.  Pt's PMH includes vertigo, COPD, tobacco abuse, hypothyroidism.   OT comments  This 65 yo female admitted and underwent above presents to acute OT with all education completed with pt and family on LBD and tub tranfers to 3n1. Acute OT will sign off.  Follow Up Recommendations  No OT follow up;Supervision - Intermittent    Equipment Recommendations  3 in 1 bedside comode       Precautions / Restrictions Precautions Precautions: Fall Precaution Comments: reviewed now pillow under knee and to maintain Rt knee extension Restrictions Weight Bearing Restrictions: Yes RLE Weight Bearing: Partial weight bearing RLE Partial Weight Bearing Percentage or Pounds: 50       Mobility Bed Mobility Overal bed mobility: Modified Independent Bed Mobility: Supine to Sit     Supine to sit: Supervision   General bed mobility comments: HOB elevated  Transfers Overall transfer level: Needs assistance Equipment used: Rolling walker (2 wheeled) Transfers: Sit to/from Stand Sit to Stand: Supervision       General transfer comment:  Pt impulsive during transfers. Feel likely to pain.  Pt's pain level decreased with standing.      Balance Overall balance assessment: Needs assistance Sitting-balance support: Feet supported;No upper extremity supported Sitting balance-Leahy Scale: Good     Standing balance support: During functional activity;Bilateral upper extremity supported Standing balance-Leahy Scale: Poor Standing balance comment: reliant on RW                   ADL Overall ADL's : Needs assistance/impaired                                Tub/ Shower Transfer: Tub transfer;Minimal assistance;Ambulation;Rolling walker;3 in 1 Tub/Shower Transfer Details (indicate cue  type and reason): A for RLE and manuever 3n1; sitting down on 3n1 angled out, moving LLE into tub while seated, stand up mainly on non-operated leg, let family rotate 3n1 all the way to facing shower head, swing RLE into tub and then reverse to get out. Educated pt and family on how to adjust shower curtain so water does not get out.                    Cognition   Behavior During Therapy: WFL for tasks assessed/performed Overall Cognitive Status: Within Functional Limits for tasks assessed                                    Pertinent Vitals/ Pain       Pain Assessment: 0-10 Pain Score: 5  Pain Location: RLE Pain Descriptors / Indicators: Grimacing;Guarding;Sore Pain Intervention(s): Monitored during session;Repositioned         Frequency Min 2X/week     Progress Toward Goals  OT Goals(current goals can now be found in the care plan section)  Progress towards OT goals:  (All education completed with pt and family )  Acute Rehab OT Goals Patient Stated Goal: make pain go away  Plan Discharge plan remains appropriate       End of Session Equipment Utilized During Treatment: Gait belt;Rolling walker   Activity Tolerance Patient tolerated treatment well   Patient Left in  chair;with call bell/phone within reach;with family/visitor present   Nurse Communication  (pt ready to go from OT/PT standpoint)        Time: [id number] OT Time Calculation (min): 19 min  Charges: OT General Charges $OT Visit: 1 Procedure OT Treatments $Self Care/Home Management : 8-22 mins  Almon Register  [id number]  01/25/2016, 11:04 AM

## 2016-01-25 NOTE — Progress Notes (Addendum)
Physical Therapy Treatment Patient Details Name: Stacy Garcia MRN: [id number] DOB: [birth year] Today's Date: 01/25/2016    History of Present Illness Pt is a 65 y/o F s/p Rt TKA.  Pt's PMH includes vertigo, COPD, tobacco abuse, hypothyroidism.    PT Comments    Pt performed increased mobility and performed stair training with family training provided to husband.  Pt performed HEP and educated on frequency.  Pt ready for d/c home.  If remains this pm will see for pm tx.    Follow Up Recommendations  Home health PT;Supervision for mobility/OOB     Equipment Recommendations  3in1 (PT)    Recommendations for Other Services OT consult     Precautions / Restrictions Precautions Precautions: Fall Precaution Comments: reviewed now pillow under knee and to maintain Rt knee extension Restrictions Weight Bearing Restrictions: Yes RLE Weight Bearing: Partial weight bearing RLE Partial Weight Bearing Percentage or Pounds: 50    Mobility  Bed Mobility Overal bed mobility: Needs Assistance Bed Mobility: Supine to Sit;Sit to Supine     Supine to sit: Supervision Sit to supine: Supervision   General bed mobility comments: HOb elevated and required increased time and cues for hand placement.    Transfers Overall transfer level: Needs assistance Equipment used: Rolling walker (2 wheeled) Transfers: Sit to/from Stand Sit to Stand: Supervision Stand pivot transfers: Supervision       General transfer comment:  Pt impulsive during transfers. Feel likely to pain.  Pt's pain level decreased with standing.    Ambulation/Gait Ambulation/Gait assistance: Min guard Ambulation Distance (Feet): 220 Feet Assistive device: Rolling walker (2 wheeled) Gait Pattern/deviations: Step-to pattern;Decreased step length - left;Decreased stance time - right   Gait velocity interpretation: Below normal speed for age/gender General Gait Details: pt able to bear weight on R LE, though does indicate  increased pain.  Cues for PWB and forward gaze.  Cues to push RW vs. picking up RW and putting down.  MIld posterior lean, able to self correct.     Stairs Stairs: Yes Stairs assistance: Min assist Stair Management: No rails Number of Stairs: 7 General stair comments: Cues for sequencing and RW placement.  pt does a good job communicating with husband during RW transition to ascend and descend stairs.   Wheelchair Mobility    Modified Rankin (Stroke Patients Only)       Balance Overall balance assessment: Needs assistance   Sitting balance-Leahy Scale: Good       Standing balance-Leahy Scale: Poor                      Cognition Arousal/Alertness: Awake/alert Behavior During Therapy: WFL for tasks assessed/performed Overall Cognitive Status: Within Functional Limits for tasks assessed                      Exercises Total Joint Exercises Ankle Circles/Pumps: AROM;Both;10 reps;Supine Quad Sets: AROM;Both;10 reps;Supine Towel Squeeze: AROM;Both;10 reps;Supine Short Arc Quad: AROM;Right;10 reps;AAROM;Supine Heel Slides: AAROM;Right;10 reps;Supine Hip ABduction/ADduction: AAROM;Right;10 reps;Supine Straight Leg Raises: AAROM;Right;10 reps;Supine Long Arc Quad: AAROM;Right;10 reps;Seated Goniometric ROM:  (8-74)    General Comments        Pertinent Vitals/Pain Pain Assessment: 0-10 Pain Score: 10-Worst pain ever Pain Location: R knee with flexion exercise.   Pain Descriptors / Indicators: Grimacing;Guarding;Moaning;Discomfort Pain Intervention(s): Monitored during session;Repositioned;Ice applied    Home Living  Prior Function            PT Goals (current goals can now be found in the care plan section) Acute Rehab PT Goals Patient Stated Goal: make pain go away Potential to Achieve Goals: Good Progress towards PT goals: Progressing toward goals    Frequency  7X/week    PT Plan Current plan remains  appropriate    Co-evaluation             End of Session Equipment Utilized During Treatment: Gait belt Activity Tolerance: Patient limited by pain Patient left: in chair;with call bell/phone within reach;with chair alarm set     Time: 0923-1005 PT Time Calculation (min) (ACUTE ONLY): 42 min  Charges:  $Gait Training: 23-37 mins $Therapeutic Exercise: 8-22 mins                    G Codes:      Cristela Blue 02/01/16, 10:21 AM  Governor Rooks, PTA pager [phone]

## 2016-01-26 DIAGNOSIS — F17210 Nicotine dependence, cigarettes, uncomplicated: Secondary | ICD-10-CM | POA: Diagnosis not present

## 2016-01-26 DIAGNOSIS — Z471 Aftercare following joint replacement surgery: Secondary | ICD-10-CM | POA: Diagnosis not present

## 2016-01-26 DIAGNOSIS — Z96651 Presence of right artificial knee joint: Secondary | ICD-10-CM | POA: Diagnosis not present

## 2016-01-26 DIAGNOSIS — J449 Chronic obstructive pulmonary disease, unspecified: Secondary | ICD-10-CM | POA: Diagnosis not present

## 2016-01-29 DIAGNOSIS — Z96651 Presence of right artificial knee joint: Secondary | ICD-10-CM | POA: Diagnosis not present

## 2016-01-29 DIAGNOSIS — F17210 Nicotine dependence, cigarettes, uncomplicated: Secondary | ICD-10-CM | POA: Diagnosis not present

## 2016-01-29 DIAGNOSIS — Z471 Aftercare following joint replacement surgery: Secondary | ICD-10-CM | POA: Diagnosis not present

## 2016-01-29 DIAGNOSIS — J449 Chronic obstructive pulmonary disease, unspecified: Secondary | ICD-10-CM | POA: Diagnosis not present

## 2016-01-31 ENCOUNTER — Other Ambulatory Visit: Payer: Self-pay | Admitting: Internal Medicine

## 2016-01-31 DIAGNOSIS — Z471 Aftercare following joint replacement surgery: Secondary | ICD-10-CM | POA: Diagnosis not present

## 2016-01-31 DIAGNOSIS — F17210 Nicotine dependence, cigarettes, uncomplicated: Secondary | ICD-10-CM | POA: Diagnosis not present

## 2016-01-31 DIAGNOSIS — J449 Chronic obstructive pulmonary disease, unspecified: Secondary | ICD-10-CM | POA: Diagnosis not present

## 2016-01-31 DIAGNOSIS — Z96651 Presence of right artificial knee joint: Secondary | ICD-10-CM | POA: Diagnosis not present

## 2016-02-02 DIAGNOSIS — J449 Chronic obstructive pulmonary disease, unspecified: Secondary | ICD-10-CM | POA: Diagnosis not present

## 2016-02-02 DIAGNOSIS — Z96651 Presence of right artificial knee joint: Secondary | ICD-10-CM | POA: Diagnosis not present

## 2016-02-02 DIAGNOSIS — Z471 Aftercare following joint replacement surgery: Secondary | ICD-10-CM | POA: Diagnosis not present

## 2016-02-02 DIAGNOSIS — F17210 Nicotine dependence, cigarettes, uncomplicated: Secondary | ICD-10-CM | POA: Diagnosis not present

## 2016-02-05 DIAGNOSIS — F17210 Nicotine dependence, cigarettes, uncomplicated: Secondary | ICD-10-CM | POA: Diagnosis not present

## 2016-02-05 DIAGNOSIS — Z96651 Presence of right artificial knee joint: Secondary | ICD-10-CM | POA: Diagnosis not present

## 2016-02-05 DIAGNOSIS — Z471 Aftercare following joint replacement surgery: Secondary | ICD-10-CM | POA: Diagnosis not present

## 2016-02-05 DIAGNOSIS — J449 Chronic obstructive pulmonary disease, unspecified: Secondary | ICD-10-CM | POA: Diagnosis not present

## 2016-02-05 NOTE — Discharge Summary (Signed)
[name], MD   Biagio Borg, PA-C [address], [place], [place]  [address]                             [phone]  PATIENT ID: Stacy Garcia        MRN:  [id number]          DOB/AGE: February 21, 1951 / 65 y.o.    DISCHARGE SUMMARY  ADMISSION DATE:    01/23/2016 DISCHARGE DATE:   01/25/2016   ADMISSION DIAGNOSIS: RIGHT KNEE OSTEOARTHRITIS    DISCHARGE DIAGNOSIS:  RIGHT KNEE OSTEOARTHRITIS    ADDITIONAL DIAGNOSIS: Principal Problem:   Primary osteoarthritis of right knee Active Problems:   S/P total knee replacement using cement  Past Medical History  Diagnosis Date  . Hyperlipidemia   . Tobacco abuse   . Vertigo   . Thyroid disease   . COPD (chronic obstructive pulmonary disease) ([place])   . Hypothyroidism   . GERD (gastroesophageal reflux disease)   . Arthritis     PROCEDURE: Procedure(s): TOTAL KNEE ARTHROPLASTY Right on 01/23/2016  CONSULTS: none     HISTORY: Charlana is a very pleasant 65 year old white female who is seen today for evaluation of her right knee. She has had problems with her right knee for several months dating back to August 2016 when she was seen for pain and discomfort in the right knee. She had at that time problems sleeping for at least 2 months with recurrent swelling and effusions. At that time it was more of an achiness and soreness. She did not have any history of injury or trauma. X-rays at that time revealed near bone-on-bone medial compartment OA with subchondral sclerosing and some patellofemoral osteoarthritis. She was given a cortisone injection at that time. She had returned in followup and continued to have pain and discomfort. She received a 2nd cortisone injection on 06/20/2015. A series of 3 Euflexxa injections was then given, finishing up on 07/19/2015. She has continued to have pain in the right knee now to the point where she has pain with every step as well asnighttime pain. She finds she is having difficulty on stairs  as well asambulatingwithout any pain. She does occasionally have some giving way noted. She has been tried with corticosteroid injections as well asthe viscosupplementation with only minimal amounts of relief. It has now gotten to the point where she has been taking hydrocodone on a chronic basis which had been prescribed by her primary care  HOSPITAL COURSE:  QUILLA KEICHER is a 65 y.o. admitted on 01/23/2016 and found to have a diagnosis of [place].  After appropriate laboratory studies were obtained  they were taken to the operating room on 01/23/2016 and underwent  Procedure(s): TOTAL KNEE ARTHROPLASTY  Right.   They were given perioperative antibiotics:  Anti-infectives    Start     Dose/Rate Route Frequency Ordered Stop   01/23/16 1330  ceFAZolin (ANCEF) IVPB 2g/100 mL premix     2 g 200 mL/hr over 30 Minutes Intravenous Every 6 hours 01/23/16 1106 01/23/16 1941   01/23/16 0516  ceFAZolin (ANCEF) IVPB 2g/100 mL premix     2 g 200 mL/hr over 30 Minutes Intravenous On call to O.R. 01/23/16 [id number] 01/23/16 0736    .  Tolerated the procedure well.  Placed with a foley intraoperatively.    Toradol was given post op.  POD #1, allowed out of bed to a chair.  PT for ambulation and exercise program.  Foley D/C'd in morning.  IV saline locked.  O2 discontionued.  POD #2, continued PT and ambulation.   Hemovac pulled.  The remainder of the hospital course was dedicated to ambulation and strengthening.   The patient was discharged on 2 Days Post-Op in  Stable condition.  Blood products given:none  DIAGNOSTIC STUDIES: Recent vital signs: No data found.      Recent laboratory studies: No results for input(s): WBC, HGB, HCT, PLT in the last 168 hours. No results for input(s): NA, K, CL, CO2, BUN, CREATININE, GLUCOSE, CALCIUM in the last 168 hours. Lab Results  Component Value Date   INR 1.02 01/12/2016     Recent Radiographic Studies :  No results found.  DISCHARGE  INSTRUCTIONS: Discharge Instructions    CPM    Complete by:  As directed   Continuous passive motion machine (CPM):      Use the CPM from 0 to 60 for 6-8 hours per day.      You may increase by 5-10 degrees per day.  You may break it up into 2 or 3 sessions per day.      Use CPM for 3-4  weeks or until you are told to stop.     Call MD / Call 911    Complete by:  As directed   If you experience chest pain or shortness of breath, CALL 911 and be transported to the hospital emergency room.  If you develope a fever above 101 F, pus (white drainage) or increased drainage or redness at the wound, or calf pain, call your surgeon's office.     Change dressing    Complete by:  As directed   DO NOT CHANGE YOUR DRESSING     Constipation Prevention    Complete by:  As directed   Drink plenty of fluids.  Prune juice may be helpful.  You may use a stool softener, such as Colace (over the counter) 100 mg twice a day.  Use MiraLax (over the counter) for constipation as needed.  May use Milk of Magnesia if needed also     Diet general    Complete by:  As directed      Discharge instructions    Complete by:  As directed   INSTRUCTIONS AFTER JOINT REPLACEMENT   Remove items at home which could result in a fall. This includes throw rugs or furniture in walking pathways ICE to the affected joint every three hours while awake for 30 minutes at a time, for at least the first 3-5 days, and then as needed for pain and swelling.  Continue to use ice for pain and swelling. You may notice swelling that will progress down to the foot and ankle.  This is normal after surgery.  Elevate your leg when you are not up walking on it.   Continue to use the breathing machine you got in the hospital (incentive spirometer) which will help keep your temperature down.  It is common for your temperature to cycle up and down following surgery, especially at night when you are not up moving around and exerting yourself.  The breathing  machine keeps your lungs expanded and your temperature down.   DIET:  As you were doing prior to hospitalization, we recommend a well-balanced diet.  DRESSING / WOUND CARE / SHOWERING  Keep the surgical dressing until follow up.  The dressing is water proof, so you can shower without any extra covering.  IF THE DRESSING FALLS OFF or the wound gets wet inside, change the dressing with sterile gauze.  Please use good hand washing techniques before changing the dressing.  Do not use any lotions or creams on the incision until instructed by your surgeon.    ACTIVITY  Increase activity slowly as tolerated, but follow the weight bearing instructions below.   No driving for 6 weeks or until further direction given by your physician.  You cannot drive while taking narcotics.  No lifting or carrying greater than 10 lbs. until further directed by your surgeon. Avoid periods of inactivity such as sitting longer than an hour when not asleep. This helps prevent blood clots.  You may return to work once you are authorized by your doctor.     WEIGHT BEARING   Partial weight bearing with assist device as directed.  50% weight bearing   EXERCISES  Results after joint replacement surgery are often greatly improved when you follow the exercise, range of motion and muscle strengthening exercises prescribed by your doctor. Safety measures are also important to protect the joint from further injury. Any time any of these exercises cause you to have increased pain or swelling, decrease what you are doing until you are comfortable again and then slowly increase them. If you have problems or questions, call your caregiver or physical therapist for advice.   Rehabilitation is important following a joint replacement. After just a few days of immobilization, the muscles of the leg can become weakened and shrink (atrophy).  These exercises are designed to build up the tone and strength of the thigh and leg muscles  and to improve motion. Often times heat used for twenty to thirty minutes before working out will loosen up your tissues and help with improving the range of motion but do not use heat for the first two weeks following surgery (sometimes heat can increase post-operative swelling).   These exercises can be done on a training (exercise) mat, on the floor, on a table or on a bed. Use whatever works the best and is most comfortable for you.    Use music or television while you are exercising so that the exercises are a pleasant break in your day. This will make your life better with the exercises acting as a break in your routine that you can look forward to.   Perform all exercises about fifteen times, three times per day or as directed.  You should exercise both the operative leg and the other leg as well.   Exercises include:   Quad Sets - Tighten up the muscle on the front of the thigh (Quad) and hold for 5-10 seconds.   Straight Leg Raises - With your knee straight (if you were given a brace, keep it on), lift the leg to 60 degrees, hold for 3 seconds, and slowly lower the leg.  Perform this exercise against resistance later as your leg gets stronger.  Leg Slides: Lying on your back, slowly slide your foot toward your buttocks, bending your knee up off the floor (only go as far as is comfortable). Then slowly slide your foot back down until your leg is flat on the floor again.  Angel Wings: Lying on your back spread your legs to the side as far apart as you can without causing discomfort.  Hamstring Strength:  Lying on your back, push your heel against the floor with your leg straight by tightening up the muscles of your buttocks.  Repeat, but this  time bend your knee to a comfortable angle, and push your heel against the floor.  You may put a pillow under the heel to make it more comfortable if necessary.   A rehabilitation program following joint replacement surgery can speed recovery and prevent  re-injury in the future due to weakened muscles. Contact your doctor or a physical therapist for more information on knee rehabilitation.    CONSTIPATION  Constipation is defined medically as fewer than three stools per week and severe constipation as less than one stool per week.  Even if you have a regular bowel pattern at home, your normal regimen is likely to be disrupted due to multiple reasons following surgery.  Combination of anesthesia, postoperative narcotics, change in appetite and fluid intake all can affect your bowels.   YOU MUST use at least one of the following options; they are listed in order of increasing strength to get the job done.  They are all available over the counter, and you may need to use some, POSSIBLY even all of these options:    Drink plenty of fluids (prune juice may be helpful) and high fiber foods Colace 100 mg by mouth twice a day  Senokot for constipation as directed and as needed Dulcolax (bisacodyl), take with full glass of water  Miralax (polyethylene glycol) once or twice a day as needed.  If you have tried all these things and are unable to have a bowel movement in the first 3-4 days after surgery call either your surgeon or your primary doctor.    If you experience loose stools or diarrhea, hold the medications until you stool forms back up.  If your symptoms do not get better within 1 week or if they get worse, check with your doctor.  If you experience "the worst abdominal pain ever" or develop nausea or vomiting, please contact the office immediately for further recommendations for treatment.   ITCHING:  If you experience itching with your medications, try taking only a single pain pill, or even half a pain pill at a time.  You can also use Benadryl over the counter for itching or also to help with sleep.   TED HOSE STOCKINGS:  Use stockings on both legs until for at least 2 weeks or as directed by physician office. They may be removed at night for  sleeping.  MEDICATIONS:  See your medication summary on the "After Visit Summary" that nursing will review with you.  You may have some home medications which will be placed on hold until you complete the course of blood thinner medication.  It is important for you to complete the blood thinner medication as prescribed.  PRECAUTIONS:  If you experience chest pain or shortness of breath - call 911 immediately for transfer to the hospital emergency department.   If you develop a fever greater that 101 F, purulent drainage from wound, increased redness or drainage from wound, foul odor from the wound/dressing, or calf pain - CONTACT YOUR SURGEON.                                                   FOLLOW-UP APPOINTMENTS:  If you do not already have a post-op appointment, please call the office for an appointment to be seen by your surgeon.  Guidelines for how soon to be seen are listed in your "  After Visit Summary", but are typically between 1-4 weeks after surgery.  OTHER INSTRUCTIONS:   Knee Replacement:  Do not place pillow under knee, focus on keeping the knee straight while resting. CPM instructions: 0-90 degrees, 2 hours in the morning, 2 hours in the afternoon, and 2 hours in the evening. Place foam block, curve side up under heel at all times except when in CPM or when walking.  DO NOT modify, tear, cut, or change the foam block in any way.  MAKE SURE YOU:  Understand these instructions.  Get help right away if you are not doing well or get worse.    Thank you for letting us be a part of your medical care team.  It is a privilege we respect greatly.  We hope these instructions will help you stay on track for a fast and full recovery!     Do not put a pillow under the knee. Place it under the heel.    Complete by:  As directed      Driving restrictions    Complete by:  As directed   No driving for 6 weeks     Increase activity slowly as tolerated    Complete by:  As directed       Lifting restrictions    Complete by:  As directed   No lifting for 6 weeks     Partial weight bearing    Complete by:  As directed   % Body Weight:  50%  Laterality:  right  Extremity:  Lower     Patient may shower    Complete by:  As directed   You may shower over the brown dressing     TED hose    Complete by:  As directed   Use stockings (TED hose) for 2-3 weeks on right leg.  You may remove them at night for sleeping.           DISCHARGE MEDICATIONS:     Medication List    STOP taking these medications        aspirin 325 MG tablet     doxycycline 100 MG capsule  Commonly known as:  VIBRAMYCIN     HYDROcodone-acetaminophen 5-325 MG tablet  Commonly known as:  NORCO/VICODIN     ibuprofen 200 MG tablet  Commonly known as:  ADVIL,MOTRIN     levothyroxine 88 MCG tablet  Commonly known as:  SYNTHROID, LEVOTHROID     methylPREDNISolone 4 MG Tbpk tablet  Commonly known as:  MEDROL DOSEPAK     simvastatin 40 MG tablet  Commonly known as:  ZOCOR      TAKE these medications        acetaminophen 500 MG tablet  Commonly known as:  TYLENOL  Take 2 tablets (1,000 mg total) by mouth every 6 (six) hours as needed.     albuterol 108 (90 Base) MCG/ACT inhaler  Commonly known as:  VENTOLIN HFA  Inhale 2 puffs into the lungs every 6 (six) hours as needed for wheezing or shortness of breath.     benzonatate 100 MG capsule  Commonly known as:  TESSALON  Take 1 capsule (100 mg total) by mouth 2 (two) times daily as needed for cough.     diazepam 5 MG tablet  Commonly known as:  VALIUM  TAKE 1/2-1 TABLET BY MOUTH TWICE A DAY     omeprazole 40 MG capsule  Commonly known as:  PRILOSEC  Take 1 capsule (40 mg total) by mouth  daily.     oxyCODONE 5 MG immediate release tablet  Commonly known as:  Oxy IR/ROXICODONE  Take 1-2 tablets (5-10 mg total) by mouth every 4 (four) hours as needed for moderate pain, severe pain or breakthrough pain.     rivaroxaban 10 MG Tabs tablet   Commonly known as:  XARELTO  Take 1 tablet (10 mg total) by mouth daily with breakfast.        FOLLOW UP VISIT:       Follow-up Information    Follow up with [facility].   Why:  They will contact you to schedule home therapy visits.   Contact information:   [place] [phone]       DISPOSITION:   Home  CONDITION:  Stable   Mike Craze. [place], [place] [phone]  02/05/2016 5:20 PM

## 2016-02-07 DIAGNOSIS — F17210 Nicotine dependence, cigarettes, uncomplicated: Secondary | ICD-10-CM | POA: Diagnosis not present

## 2016-02-07 DIAGNOSIS — Z471 Aftercare following joint replacement surgery: Secondary | ICD-10-CM | POA: Diagnosis not present

## 2016-02-07 DIAGNOSIS — Z96651 Presence of right artificial knee joint: Secondary | ICD-10-CM | POA: Diagnosis not present

## 2016-02-07 DIAGNOSIS — J449 Chronic obstructive pulmonary disease, unspecified: Secondary | ICD-10-CM | POA: Diagnosis not present

## 2016-02-08 DIAGNOSIS — Z471 Aftercare following joint replacement surgery: Secondary | ICD-10-CM | POA: Diagnosis not present

## 2016-02-08 DIAGNOSIS — J449 Chronic obstructive pulmonary disease, unspecified: Secondary | ICD-10-CM | POA: Diagnosis not present

## 2016-02-08 DIAGNOSIS — F17210 Nicotine dependence, cigarettes, uncomplicated: Secondary | ICD-10-CM | POA: Diagnosis not present

## 2016-02-08 DIAGNOSIS — Z96651 Presence of right artificial knee joint: Secondary | ICD-10-CM | POA: Diagnosis not present

## 2016-02-09 DIAGNOSIS — M1711 Unilateral primary osteoarthritis, right knee: Secondary | ICD-10-CM | POA: Diagnosis not present

## 2016-02-12 ENCOUNTER — Other Ambulatory Visit: Payer: Medicare HMO

## 2016-02-19 ENCOUNTER — Encounter: Payer: Medicare HMO | Admitting: Family Medicine

## 2016-04-11 DIAGNOSIS — M25661 Stiffness of right knee, not elsewhere classified: Secondary | ICD-10-CM | POA: Diagnosis not present

## 2016-04-11 DIAGNOSIS — M1711 Unilateral primary osteoarthritis, right knee: Secondary | ICD-10-CM | POA: Diagnosis not present

## 2016-04-11 DIAGNOSIS — Z471 Aftercare following joint replacement surgery: Secondary | ICD-10-CM | POA: Diagnosis not present

## 2016-04-11 DIAGNOSIS — M25561 Pain in right knee: Secondary | ICD-10-CM | POA: Diagnosis not present

## 2016-04-20 ENCOUNTER — Other Ambulatory Visit: Payer: Self-pay | Admitting: Family Medicine

## 2016-06-04 ENCOUNTER — Other Ambulatory Visit: Payer: Self-pay

## 2016-06-04 ENCOUNTER — Telehealth: Payer: Self-pay | Admitting: Adult Health

## 2016-06-04 MED ORDER — LEVOTHYROXINE SODIUM 88 MCG PO TABS: 88.0000 ug | ORAL_TABLET | Freq: Every day | ORAL | 3 refills | Status: DC

## 2016-06-04 MED ORDER — SIMVASTATIN 40 MG PO TABS: 40.0000 mg | ORAL_TABLET | Freq: Every day | ORAL | 3 refills | Status: DC

## 2016-06-04 NOTE — Telephone Encounter (Signed)
Ok to refill for 90+3 

## 2016-06-04 NOTE — Telephone Encounter (Signed)
Ok to refill 

## 2016-06-04 NOTE — Telephone Encounter (Signed)
Pt is previous [name] pt, but has switched to Stacy Garcia. Pt has made a cpe for 06/20/16 but is out of her  levothyroxine (SYNTHROID, LEVOTHROID) 88 MCG tablet simvastatin (ZOCOR) 40 MG tablet  Can you refill and send to  [facility] 90 day

## 2016-06-04 NOTE — Telephone Encounter (Signed)
Was first sent to wrong pharmacy by accident, but I called and canceled - & Rx has been refilled and sent to [facility].

## 2016-06-16 ENCOUNTER — Other Ambulatory Visit: Payer: Self-pay | Admitting: Family Medicine

## 2016-06-20 ENCOUNTER — Ambulatory Visit (INDEPENDENT_AMBULATORY_CARE_PROVIDER_SITE_OTHER): Payer: Commercial Managed Care - HMO | Admitting: Adult Health

## 2016-06-20 ENCOUNTER — Encounter: Payer: Self-pay | Admitting: Adult Health

## 2016-06-20 VITALS — BP 138/64 | Temp 98.2°F | Ht 65.0 in | Wt 130.8 lb

## 2016-06-20 DIAGNOSIS — F17200 Nicotine dependence, unspecified, uncomplicated: Secondary | ICD-10-CM

## 2016-06-20 DIAGNOSIS — Z0000 Encounter for general adult medical examination without abnormal findings: Secondary | ICD-10-CM | POA: Diagnosis not present

## 2016-06-20 DIAGNOSIS — E034 Atrophy of thyroid (acquired): Secondary | ICD-10-CM

## 2016-06-20 DIAGNOSIS — E785 Hyperlipidemia, unspecified: Secondary | ICD-10-CM

## 2016-06-20 LAB — LIPID PANEL
CHOL/HDL RATIO: 5
Cholesterol: 220 mg/dL — ABNORMAL HIGH (ref 0–200)
HDL: 47.8 mg/dL (ref >39.00)
LDL Cholesterol: 141 mg/dL — ABNORMAL HIGH (ref 0–99)
NONHDL: 172.02
Triglycerides: 154 mg/dL — ABNORMAL HIGH (ref 0.0–149.0)
VLDL: 30.8 mg/dL (ref 0.0–40.0)

## 2016-06-20 LAB — CBC WITH DIFFERENTIAL/PLATELET
BASOS ABS: 0 10*3/uL (ref 0.0–0.1)
Basophils Relative: 0.6 % (ref 0.0–3.0)
Eosinophils Absolute: 0.3 10*3/uL (ref 0.0–0.7)
Eosinophils Relative: 4.3 % (ref 0.0–5.0)
HEMATOCRIT: 47.8 % — AB (ref 36.0–46.0)
Hemoglobin: 16.3 g/dL — ABNORMAL HIGH (ref 12.0–15.0)
LYMPHS PCT: 39.4 % (ref 12.0–46.0)
Lymphs Abs: 2.8 10*3/uL (ref 0.7–4.0)
MCHC: 34.1 g/dL (ref 30.0–36.0)
MCV: 91.5 fl (ref 78.0–100.0)
Monocytes Absolute: 0.3 10*3/uL (ref 0.1–1.0)
Monocytes Relative: 4.4 % (ref 3.0–12.0)
Neutro Abs: 3.6 10*3/uL (ref 1.4–7.7)
Neutrophils Relative %: 51.3 % (ref 43.0–77.0)
Platelets: 225 10*3/uL (ref 150.0–400.0)
RBC: 5.22 Mil/uL — AB (ref 3.87–5.11)
RDW: 14.8 % (ref 11.5–15.5)
WBC: 7.1 10*3/uL (ref 4.0–10.5)

## 2016-06-20 LAB — POC URINALSYSI DIPSTICK (AUTOMATED)
Bilirubin, UA: NEGATIVE
Glucose, UA: NEGATIVE
Ketones, UA: NEGATIVE
Leukocytes, UA: NEGATIVE
Nitrite, UA: POSITIVE
PH UA: 5.5
PROTEIN UA: NEGATIVE
UROBILINOGEN UA: 1

## 2016-06-20 LAB — BASIC METABOLIC PANEL
BUN: 18 mg/dL (ref 6–23)
CALCIUM: 9.5 mg/dL (ref 8.4–10.5)
CO2: 31 mEq/L (ref 19–32)
Chloride: 105 mEq/L (ref 96–112)
Creatinine, Ser: 0.76 mg/dL (ref 0.40–1.20)
GFR: 80.51 mL/min (ref >60.00)
Glucose, Bld: 90 mg/dL (ref 70–99)
Potassium: 4.3 mEq/L (ref 3.5–5.1)
SODIUM: 142 meq/L (ref 135–145)

## 2016-06-20 LAB — HEPATIC FUNCTION PANEL
ALBUMIN: 4 g/dL (ref 3.5–5.2)
ALK PHOS: 82 U/L (ref 39–117)
ALT: 9 U/L (ref 0–35)
AST: 11 U/L (ref 0–37)
Bilirubin, Direct: 0.1 mg/dL (ref 0.0–0.3)
TOTAL PROTEIN: 6.6 g/dL (ref 6.0–8.3)
Total Bilirubin: 0.5 mg/dL (ref 0.2–1.2)

## 2016-06-20 LAB — TSH: TSH: 1.92 u[IU]/mL (ref 0.35–4.50)

## 2016-06-20 MED ORDER — SIMVASTATIN 40 MG PO TABS: 40.0000 mg | ORAL_TABLET | Freq: Every day | ORAL | 3 refills | Status: DC

## 2016-06-20 MED ORDER — LEVOTHYROXINE SODIUM 88 MCG PO TABS: 88.0000 ug | ORAL_TABLET | Freq: Every day | ORAL | 3 refills | Status: DC

## 2016-06-20 NOTE — Progress Notes (Signed)
Subjective:    Patient ID: Stacy Garcia, female    DOB: [birth year], 65 y.o.   MRN: [id number]  HPI  Patient presents for yearly preventative medicine examination. She is a pleasant 65 year old female who  has a past medical history of Arthritis; COPD (chronic obstructive pulmonary disease) ([place]); GERD (gastroesophageal reflux disease); Hyperlipidemia; Hypothyroidism; Thyroid disease; Tobacco abuse; and Vertigo.  All immunizations and health maintenance protocols were reviewed with the patient and needed orders were placed.  Appropriate screening laboratory values were ordered for the patient including screening of hyperlipidemia, renal function and hepatic function.  Medication reconciliation,  past medical history, social history, problem list and allergies were reviewed in detail with the patient  Goals were established with regard to weight loss, exercise, and  diet in compliance with medications. Since her knee surgery she is exercising more, she found that she likes to bike ride and do the stair machine. She is not eating healthy  She gets routine eye care, dental care, and yearly mammography, colonoscopy 10 years ago was normal. She does not want to do another one.   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   Her interval history includes that of a right knee replacement in May.   She continues to smoke and does not want to quit.      Review of Systems  Constitutional: Negative.   HENT: Negative.   Eyes: Negative.   Respiratory: Negative.   Cardiovascular: Negative.   Gastrointestinal: Negative.   Genitourinary: Negative.   Musculoskeletal: Negative.   Neurological: Negative.   Hematological: Negative.   All other systems reviewed and are negative.  Past Medical History:  Diagnosis Date  . Arthritis   . COPD (chronic  obstructive pulmonary disease) ([place])   . GERD (gastroesophageal reflux disease)   . Hyperlipidemia   . Hypothyroidism   . Thyroid disease   . Tobacco abuse   . Vertigo     Social History   Social History  . Marital status: Married    Spouse name: N/A  . Number of children: N/A  . Years of education: N/A   Occupational History  . Retired    Social History Main Topics  . Smoking status: Current Every Day Smoker    Packs/day: 1.00    Years: 50.00    Types: Cigarettes  . Smokeless tobacco: Not on file  . Alcohol use No  . Drug use: No  . Sexual activity: Not on file   Other Topics Concern  . Not on file   Social History Narrative  . No narrative on file    Past Surgical History:  Procedure Laterality Date  . CESAREAN SECTION     x 3  . SHOULDER SURGERY  02/2015  . TOTAL KNEE ARTHROPLASTY Right 01/23/2016   Procedure: TOTAL KNEE ARTHROPLASTY;  Surgeon: Garald Balding, MD;  Location: [facility];  Service: Orthopedics;  Laterality: Right;    Family History  Problem Relation Age of Onset  . Thyroid disease Other   . Diabetes Other   . Hyperlipidemia Other   . Hypertension Other     No Known Allergies  Current Outpatient Prescriptions on File Prior to Visit  Medication Sig Dispense Refill  . acetaminophen (TYLENOL) 500 MG tablet Take 2 tablets (1,000 mg total) by mouth every 6 (six) hours as needed. 30 tablet  0  . albuterol (VENTOLIN HFA) 108 (90 Base) MCG/ACT inhaler Inhale 2 puffs into the lungs every 6 (six) hours as needed for wheezing or shortness of breath. 3 Inhaler 3  . diazepam (VALIUM) 5 MG tablet TAKE 1/2 - 1 TABLET BY MOUTH TWICE A DAY 60 tablet 0  . levothyroxine (SYNTHROID, LEVOTHROID) 88 MCG tablet Take 1 tablet (88 mcg total) by mouth daily. 90 tablet 3  . omeprazole (PRILOSEC) 40 MG capsule TAKE 1 CAPSULE (40 MG TOTAL) BY MOUTH DAILY. 90 capsule 1  . oxyCODONE (OXY IR/ROXICODONE) 5 MG immediate release tablet Take 1-2 tablets (5-10 mg total) by mouth  every 4 (four) hours as needed for moderate pain, severe pain or breakthrough pain. 90 tablet 0  . rivaroxaban (XARELTO) 10 MG TABS tablet Take 1 tablet (10 mg total) by mouth daily with breakfast. 12 tablet 0  . simvastatin (ZOCOR) 40 MG tablet Take 1 tablet (40 mg total) by mouth at bedtime. 90 tablet 3   No current facility-administered medications on file prior to visit.     BP 138/64   Temp 98.2 F (36.8 C) (Oral)   Ht 5\' 5"  (1.651 m)   Wt 130 lb 12.8 oz (59.3 kg)   BMI 21.77 kg/m       Objective:   Physical Exam  Constitutional: She is oriented to person, place, and time. She appears well-developed and well-nourished. No distress.  HENT:  Head: Normocephalic and atraumatic.  Right Ear: External ear normal.  Left Ear: External ear normal.  Nose: Nose normal.  Mouth/Throat: Oropharynx is clear and moist. No oropharyngeal exudate.  Eyes: Conjunctivae and EOM are normal. Pupils are equal, round, and reactive to light. Right eye exhibits no discharge. Left eye exhibits no discharge. No scleral icterus.  Neck: Normal range of motion. Neck supple. No JVD present. No tracheal tenderness present. Carotid bruit is not present. No tracheal deviation present. No thyroid mass and no thyromegaly present.  Cardiovascular: Normal rate, regular rhythm, normal heart sounds and intact distal pulses.  Exam reveals no gallop and no friction rub.   No murmur heard. Pulmonary/Chest: Effort normal. No stridor. No respiratory distress. She has decreased breath sounds (bilateral ). She has no wheezes. She has no rales. She exhibits no tenderness.  Abdominal: Soft. Bowel sounds are normal. She exhibits no distension and no mass. There is no tenderness. There is no rebound and no guarding.  Genitourinary:  Genitourinary Comments: Refused  Musculoskeletal: Normal range of motion. She exhibits no edema, tenderness or deformity.  Lymphadenopathy:    She has no cervical adenopathy.  Neurological: She is  alert and oriented to person, place, and time. She has normal reflexes.  Skin: Skin is warm and dry. No rash noted. She is not diaphoretic. No erythema. No pallor.  Surgical scar on right knee  Psychiatric: She has a normal mood and affect. Her behavior is normal. Judgment and thought content normal.  Nursing note and vitals reviewed.     Assessment & Plan:  1. Routine general medical examination at a health care facility - POCT Urinalysis Dipstick (Automated) - Basic metabolic panel - CBC with Differential/Platelet - Hepatic function panel - Lipid panel - TSH - Continue to exercise - Educated on the importance of a heart healthy diet - Advised to quit smoking - Follow up in one year or sooner if needed 2. Hypothyroidism due to acquired atrophy of thyroid - levothyroxine (SYNTHROID, LEVOTHROID) 88 MCG tablet; Take 1 tablet (88 mcg total) by mouth  daily.  Dispense: 90 tablet; Refill: 3 - TSH - Consider changing synthroid dose 3. TOBACCO ABUSE - Advised to quit smoking. She refues  4. Hyperlipidemia, unspecified hyperlipidemia type - Educated on the importance of a heart healthy diet - simvastatin (ZOCOR) 40 MG tablet; Take 1 tablet (40 mg total) by mouth at bedtime.  Dispense: 90 tablet; Refill: 3 - POCT Urinalysis Dipstick (Automated) - Basic metabolic panel - CBC with Differential/Platelet - Hepatic function panel - Lipid panel - TSH - Consider changing Zocor dose   [name], NP

## 2016-06-20 NOTE — Patient Instructions (Addendum)
It was great seeing you today!  I will follow up with you regarding your blood work.   Please follow up with me in one year or sooner if needed  Health Maintenance, Female Adopting a healthy lifestyle and getting preventive care can go a long way to promote health and wellness. Talk with your health care provider about what schedule of regular examinations is right for you. This is a good chance for you to check in with your provider about disease prevention and staying healthy. In between checkups, there are plenty of things you can do on your own. Experts have done a lot of research about which lifestyle changes and preventive measures are most likely to keep you healthy. Ask your health care provider for more information. WEIGHT AND DIET  Eat a healthy diet  Be sure to include plenty of vegetables, fruits, low-fat dairy products, and lean protein.  Do not eat a lot of foods high in solid fats, added sugars, or salt.  Get regular exercise. This is one of the most important things you can do for your health.  Most adults should exercise for at least 150 minutes each week. The exercise should increase your heart rate and make you sweat (moderate-intensity exercise).  Most adults should also do strengthening exercises at least twice a week. This is in addition to the moderate-intensity exercise.  Maintain a healthy weight  Body mass index (BMI) is a measurement that can be used to identify possible weight problems. It estimates body fat based on height and weight. Your health care provider can help determine your BMI and help you achieve or maintain a healthy weight.  For females 57 years of age and older:   A BMI below 18.5 is considered underweight.  A BMI of 18.5 to 24.9 is normal.  A BMI of 25 to 29.9 is considered overweight.  A BMI of 30 and above is considered obese.  Watch levels of cholesterol and blood lipids  You should start having your blood tested for lipids and  cholesterol at 67 years of age, then have this test every 5 years.  You may need to have your cholesterol levels checked more often if:  Your lipid or cholesterol levels are high.  You are older than 67 years of age.  You are at high risk for heart disease.  CANCER SCREENING   Lung Cancer  Lung cancer screening is recommended for adults 31-63 years old who are at high risk for lung cancer because of a history of smoking.  A yearly low-dose CT scan of the lungs is recommended for people who:  Currently smoke.  Have quit within the past 15 years.  Have at least a 30-pack-year history of smoking. A pack year is smoking an average of one pack of cigarettes a day for 1 year.  Yearly screening should continue until it has been 15 years since you quit.  Yearly screening should stop if you develop a health problem that would prevent you from having lung cancer treatment.  Breast Cancer  Practice breast self-awareness. This means understanding how your breasts normally appear and feel.  It also means doing regular breast self-exams. Let your health care provider know about any changes, no matter how small.  If you are in your 20s or 30s, you should have a clinical breast exam (CBE) by a health care provider every 1-3 years as part of a regular health exam.  If you are 43 or older, have a CBE every  having a breast X-ray (mammogram) every year.  If you have a family history of breast cancer, talk to your health care provider about genetic screening.  If you are at high risk for breast cancer, talk to your health care provider about having an MRI and a mammogram every year.  Breast cancer gene (BRCA) assessment is recommended for women who have family members with BRCA-related cancers. BRCA-related cancers include:  Breast.  Ovarian.  Tubal.  Peritoneal cancers.  Results of the assessment will determine the need for genetic counseling and BRCA1 and BRCA2  testing. Cervical Cancer Your health care provider may recommend that you be screened regularly for cancer of the pelvic organs (ovaries, uterus, and vagina). This screening involves a pelvic examination, including checking for microscopic changes to the surface of your cervix (Pap test). You may be encouraged to have this screening done every 3 years, beginning at age 21.  For women ages 30-65, health care providers may recommend pelvic exams and Pap testing every 3 years, or they may recommend the Pap and pelvic exam, combined with testing for human papilloma virus (HPV), every 5 years. Some types of HPV increase your risk of cervical cancer. Testing for HPV may also be done on women of any age with unclear Pap test results.  Other health care providers may not recommend any screening for nonpregnant women who are considered low risk for pelvic cancer and who do not have symptoms. Ask your health care provider if a screening pelvic exam is right for you.  If you have had past treatment for cervical cancer or a condition that could lead to cancer, you need Pap tests and screening for cancer for at least 20 years after your treatment. If Pap tests have been discontinued, your risk factors (such as having a new sexual partner) need to be reassessed to determine if screening should resume. Some women have medical problems that increase the chance of getting cervical cancer. In these cases, your health care provider may recommend more frequent screening and Pap tests. Colorectal Cancer  This type of cancer can be detected and often prevented.  Routine colorectal cancer screening usually begins at 67 years of age and continues through 67 years of age.  Your health care provider may recommend screening at an earlier age if you have risk factors for colon cancer.  Your health care provider may also recommend using home test kits to check for hidden blood in the stool.  A small camera at the end of a  tube can be used to examine your colon directly (sigmoidoscopy or colonoscopy). This is done to check for the earliest forms of colorectal cancer.  Routine screening usually begins at age 50.  Direct examination of the colon should be repeated every 5-10 years through 67 years of age. However, you may need to be screened more often if early forms of precancerous polyps or small growths are found. Skin Cancer  Check your skin from head to toe regularly.  Tell your health care provider about any new moles or changes in moles, especially if there is a change in a mole's shape or color.  Also tell your health care provider if you have a mole that is larger than the size of a pencil eraser.  Always use sunscreen. Apply sunscreen liberally and repeatedly throughout the day.  Protect yourself by wearing long sleeves, pants, a wide-brimmed hat, and sunglasses whenever you are outside. HEART DISEASE, DIABETES, AND HIGH BLOOD PRESSURE   High blood   High blood pressure causes heart disease and increases the risk of stroke. High blood pressure is more likely to develop in:  People who have blood pressure in the high end of the normal range (130-139/85-89 mm Hg).  People who are overweight or obese.  People who are African American.  If you are 18-39 years of age, have your blood pressure checked every 3-5 years. If you are 40 years of age or older, have your blood pressure checked every year. You should have your blood pressure measured twice--once when you are at a hospital or clinic, and once when you are not at a hospital or clinic. Record the average of the two measurements. To check your blood pressure when you are not at a hospital or clinic, you can use:  An automated blood pressure machine at a pharmacy.  A home blood pressure monitor.  If you are between 55 years and 79 years old, ask your health care provider if you should take aspirin to prevent strokes.  Have regular diabetes screenings. This  involves taking a blood sample to check your fasting blood sugar level.  If you are at a normal weight and have a low risk for diabetes, have this test once every three years after 67 years of age.  If you are overweight and have a high risk for diabetes, consider being tested at a younger age or more often. PREVENTING INFECTION  Hepatitis B  If you have a higher risk for hepatitis B, you should be screened for this virus. You are considered at high risk for hepatitis B if:  You were born in a country where hepatitis B is common. Ask your health care provider which countries are considered high risk.  Your parents were born in a high-risk country, and you have not been immunized against hepatitis B (hepatitis B vaccine).  You have HIV or AIDS.  You use needles to inject street drugs.  You live with someone who has hepatitis B.  You have had sex with someone who has hepatitis B.  You get hemodialysis treatment.  You take certain medicines for conditions, including cancer, organ transplantation, and autoimmune conditions. Hepatitis C  Blood testing is recommended for:  Everyone born from 1945 through 1965.  Anyone with known risk factors for hepatitis C. Sexually transmitted infections (STIs)  You should be screened for sexually transmitted infections (STIs) including gonorrhea and chlamydia if:  You are sexually active and are younger than 67 years of age.  You are older than 67 years of age and your health care provider tells you that you are at risk for this type of infection.  Your sexual activity has changed since you were last screened and you are at an increased risk for chlamydia or gonorrhea. Ask your health care provider if you are at risk.  If you do not have HIV, but are at risk, it may be recommended that you take a prescription medicine daily to prevent HIV infection. This is called pre-exposure prophylaxis (PrEP). You are considered at risk if:  You are  sexually active and do not regularly use condoms or know the HIV status of your partner(s).  You take drugs by injection.  You are sexually active with a partner who has HIV. Talk with your health care provider about whether you are at high risk of being infected with HIV. If you choose to begin PrEP, you should first be tested for HIV. You should then be tested every 3 months for as   long as you are taking PrEP.  PREGNANCY   If you are premenopausal and you may become pregnant, ask your health care provider about preconception counseling.  If you may become pregnant, take 400 to 800 micrograms (mcg) of folic acid every day.  If you want to prevent pregnancy, talk to your health care provider about birth control (contraception). OSTEOPOROSIS AND MENOPAUSE   Osteoporosis is a disease in which the bones lose minerals and strength with aging. This can result in serious bone fractures. Your risk for osteoporosis can be identified using a bone density scan.  If you are 27 years of age or older, or if you are at risk for osteoporosis and fractures, ask your health care provider if you should be screened.  Ask your health care provider whether you should take a calcium or vitamin D supplement to lower your risk for osteoporosis.  Menopause may have certain physical symptoms and risks.  Hormone replacement therapy may reduce some of these symptoms and risks. Talk to your health care provider about whether hormone replacement therapy is right for you.  HOME CARE INSTRUCTIONS   Schedule regular health, dental, and eye exams.  Stay current with your immunizations.   Do not use any tobacco products including cigarettes, chewing tobacco, or electronic cigarettes.  If you are pregnant, do not drink alcohol.  If you are breastfeeding, limit how much and how often you drink alcohol.  Limit alcohol intake to no more than 1 drink per day for nonpregnant women. One drink equals 12 ounces of beer, 5  ounces of wine, or 1 ounces of hard liquor.  Do not use street drugs.  Do not share needles.  Ask your health care provider for help if you need support or information about quitting drugs.  Tell your health care provider if you often feel depressed.  Tell your health care provider if you have ever been abused or do not feel safe at home.   This information is not intended to replace advice given to you by your health care provider. Make sure you discuss any questions you have with your health care provider.   Document Released: 03/11/2011 Document Revised: 09/16/2014 Document Reviewed: 07/28/2013 Elsevier Interactive Patient Education [employer].

## 2016-07-19 ENCOUNTER — Ambulatory Visit (INDEPENDENT_AMBULATORY_CARE_PROVIDER_SITE_OTHER): Payer: Commercial Managed Care - HMO | Admitting: Orthopaedic Surgery

## 2016-07-19 ENCOUNTER — Encounter (INDEPENDENT_AMBULATORY_CARE_PROVIDER_SITE_OTHER): Payer: Self-pay | Admitting: Orthopaedic Surgery

## 2016-07-19 VITALS — BP 128/75 | HR 82 | Resp 14 | Ht 65.0 in | Wt 133.0 lb

## 2016-07-19 DIAGNOSIS — M1711 Unilateral primary osteoarthritis, right knee: Secondary | ICD-10-CM

## 2016-07-19 NOTE — Progress Notes (Signed)
Pt given Handicap placard for 6 months then pt to follow up in 6 months

## 2016-07-19 NOTE — Progress Notes (Signed)
Office Visit Note   Patient: Stacy Garcia           Date of Birth: 1951/07/06           MRN: [id number] Visit Date: 07/19/2016              Requested by: [name], NP [address] [place], [place] [address] PCP: [name], NP   Assessment & Plan: Visit Diagnoses: No diagnosis found.  Plan: f/u 6 months  Follow-Up Instructions: No Follow-up on file. Plan see patient back in 6 months and instructed on exercises. I believe the problem with her knee is related to muscle weakness and she needs to work on her quadriceps over the next 6 months. She still smoking and that obviously that is an issue.  Orders:  No orders of the defined types were placed in this encounter.  No orders of the defined types were placed in this encounter.     Procedures: No procedures performed   Clinical Data: No additional findings.   Subjective: 6 months status post right total knee replacement with some discomfort at night. There is no swelling or ecchymosis. There's been no injury. No chief complaint on file.   Pt had Right TKA on 01/23/16. Almost 6 month Post op. Pt still has pain. She went to formal PT one time but states she goes to a senior center to do her own exercises shown, 1 time.     Review of Systems  Constitutional: Negative.   HENT: Negative.   Eyes: Negative.   Respiratory: Negative.   Cardiovascular: Negative.   Gastrointestinal: Negative.   Endocrine: Negative.   Genitourinary: Negative.   Musculoskeletal: Negative.   Skin: Negative.   Allergic/Immunologic: Negative.   Neurological: Negative.   Hematological: Negative.   Psychiatric/Behavioral: Negative.      Objective: Vital Signs: There were no vitals taken for this visit.  Physical Exam  Ortho Exam exam of the right knee reveals full extension and flexes 118 with the goniometer. The knee was not swollen there was no effusion. Neurovascular exam was intact. There was no evidence of instability.  Neurologically was intact. This is somewhat decreased girth of her right side consistent with weakness.  Specialty Comments:  No specialty comments available.  Imaging: No results found.   PMFS History: Patient Active Problem List   Diagnosis Date Noted  . Primary osteoarthritis of right knee 01/23/2016  . S/P total knee replacement using cement 01/23/2016  . Acute cystitis 08/23/2014  . Peptic ulcer associated with Helicobacter pylori infection 05/03/2014  . Acute right hip pain 04/28/2014  . Pain of left leg 04/28/2014  . Abdominal pain, epigastric 04/28/2014  . AAA (abdominal aortic aneurysm) without rupture ([facility]) 03/08/2013  . Left otitis media 10/08/2012  . Unilateral occipital headache 04/20/2012  . Vaginitis 02/24/2012  . Left knee pain 11/19/2010  . SHOULDER PAIN, BILATERAL 07/17/2010  . WRIST PAIN, RIGHT 02/15/2010  . Hypothyroidism 08/01/2008  . COPD exacerbation ([facility]) 08/01/2008  . Backache 11/26/2007  . HYPOTHYROIDISM, POST-RADIOACTIVE IODINE 07/20/2007  . TOBACCO ABUSE 07/20/2007  . HOT FLASHES 07/20/2007  . BENIGN POSITIONAL VERTIGO, HX OF 07/20/2007  . INFLAMMATORY DISEASE OF BREAST 07/19/2007  . FIBROCYSTIC BREAST DISEASE 04/01/2007   Past Medical History:  Diagnosis Date  . Arthritis   . COPD (chronic obstructive pulmonary disease) ([facility])   . GERD (gastroesophageal reflux disease)   . Hyperlipidemia   . Hypothyroidism   . Thyroid disease   . Tobacco abuse   .  Vertigo     Family History  Problem Relation Age of Onset  . Thyroid disease Other   . Diabetes Other   . Hyperlipidemia Other   . Hypertension Other     Past Surgical History:  Procedure Laterality Date  . CESAREAN SECTION     x 3  . SHOULDER SURGERY  02/2015  . TOTAL KNEE ARTHROPLASTY Right 01/23/2016   Procedure: TOTAL KNEE ARTHROPLASTY;  Surgeon: Garald Balding, MD;  Location: [facility];  Service: Orthopedics;  Laterality: Right;   Social History   Occupational History  . Retired     Social History Main Topics  . Smoking status: Current Every Day Smoker    Packs/day: 1.00    Years: 50.00    Types: Cigarettes  . Smokeless tobacco: Not on file  . Alcohol use No  . Drug use: No  . Sexual activity: Not on file

## 2016-07-22 ENCOUNTER — Other Ambulatory Visit: Payer: Self-pay | Admitting: Family Medicine

## 2016-09-18 DIAGNOSIS — Z1231 Encounter for screening mammogram for malignant neoplasm of breast: Secondary | ICD-10-CM | POA: Diagnosis not present

## 2016-09-18 LAB — HM MAMMOGRAPHY

## 2016-09-19 ENCOUNTER — Encounter: Payer: Self-pay | Admitting: Internal Medicine

## 2016-09-30 ENCOUNTER — Other Ambulatory Visit: Payer: Self-pay | Admitting: Adult Health

## 2016-09-30 DIAGNOSIS — I714 Abdominal aortic aneurysm, without rupture, unspecified: Secondary | ICD-10-CM

## 2016-10-09 ENCOUNTER — Ambulatory Visit (INDEPENDENT_AMBULATORY_CARE_PROVIDER_SITE_OTHER): Payer: Medicare HMO

## 2016-10-09 ENCOUNTER — Encounter (INDEPENDENT_AMBULATORY_CARE_PROVIDER_SITE_OTHER): Payer: Self-pay | Admitting: Orthopedic Surgery

## 2016-10-09 ENCOUNTER — Ambulatory Visit (INDEPENDENT_AMBULATORY_CARE_PROVIDER_SITE_OTHER): Payer: Medicare HMO | Admitting: Orthopedic Surgery

## 2016-10-09 VITALS — BP 105/76 | HR 92 | Resp 16 | Ht 65.0 in | Wt 137.0 lb

## 2016-10-09 DIAGNOSIS — G8929 Other chronic pain: Secondary | ICD-10-CM

## 2016-10-09 DIAGNOSIS — M25512 Pain in left shoulder: Secondary | ICD-10-CM | POA: Diagnosis not present

## 2016-10-09 DIAGNOSIS — M19011 Primary osteoarthritis, right shoulder: Secondary | ICD-10-CM | POA: Diagnosis not present

## 2016-10-09 DIAGNOSIS — M25511 Pain in right shoulder: Secondary | ICD-10-CM

## 2016-10-09 MED ORDER — METHYLPREDNISOLONE ACETATE 40 MG/ML IJ SUSP
80.0000 mg | INTRAMUSCULAR | Status: AC | PRN
  Administered 2016-10-09: 80 mg

## 2016-10-09 MED ORDER — LIDOCAINE HCL 1 % IJ SOLN
2.0000 mL | INTRAMUSCULAR | Status: AC | PRN
  Administered 2016-10-09: 2 mL

## 2016-10-09 MED ORDER — BUPIVACAINE HCL 0.5 % IJ SOLN
2.0000 mL | INTRAMUSCULAR | Status: AC | PRN
  Administered 2016-10-09: 2 mL via INTRA_ARTICULAR

## 2016-10-09 NOTE — Progress Notes (Signed)
Office Visit Note   Patient: Stacy Garcia           Date of Birth: 1950/11/18           MRN: [id number] Visit Date: 10/09/2016              Requested by: [name], NP [address] [place], [place] [id number] PCP: [name], NP   Assessment & Plan: Visit Diagnoses:  1. Chronic left shoulder pain   2. Chronic right shoulder pain   3. Primary osteoarthritis, right shoulder     Plan: #1: Corticosteroid injection subacromially to the left shoulder. #2: Return in 2 weeks for corticosteroid injection into an extra-articular to the right shoulder. #3: If no response to the injection left shoulder will obtain an MRI scan.   Follow-Up Instructions: Return in about 2 weeks (around 10/23/2016).   Orders:  Orders Placed This Encounter  Procedures  . Large Joint Injection/Arthrocentesis  . XR Shoulder Left  . XR Shoulder Right   No orders of the defined types were placed in this encounter.     Procedures: Large Joint Inj Date/Time: 10/09/2016 9:25 AM Performed by: Biagio Borg D Authorized by: Biagio Borg D   Consent Given by:  Patient Timeout: prior to procedure the correct patient, procedure, and site was verified   Indications:  Pain Location:  Shoulder Site:  L subacromial bursa Prep: patient was prepped and draped in usual sterile fashion   Needle Size:  25 G Needle Length:  1.5 inches Approach:  Lateral Ultrasound Guidance: No   Fluoroscopic Guidance: No   Arthrogram: No   Medications:  80 mg methylPREDNISolone acetate 40 MG/ML; 2 mL lidocaine 1 %; 2 mL bupivacaine 0.5 % Aspiration Attempted: No   Patient tolerance:  Patient tolerated the procedure well with no immediate complications     Clinical Data: No additional findings.   Subjective: Chief Complaint  Patient presents with  . Right Shoulder - Pain  . Left Shoulder - Pain    Stacy Garcia is a very pleasant 66 year old white female who is seen today for evaluation of both shoulders.  She's had an arthroscopic subacromial decompression of the right shoulder with distal clavicle excision on 02/09/2015. She states though that the both of her shoulders are hurting her now. Is more symptomatic than her right. She is having problems with range of motion as well as with difficulty sleeping. She has tried anti-inflammatories including that of Aleve and Advil with no benefit. Denies any recent history of injury or trauma. Seen today for evaluation.     Review of Systems  Constitutional: Negative.        History of sleep apnea  HENT: Negative.   Respiratory: Negative.   Cardiovascular: Negative.   Gastrointestinal: Negative.   Genitourinary: Negative.   Skin: Negative.   Neurological: Negative.   Hematological: Negative.   Psychiatric/Behavioral: Negative.      Objective: Vital Signs: BP 105/76 (BP Location: Right Arm, Patient Position: Sitting, Cuff Size: Normal)   Pulse 92   Resp 16   Ht 5\' 5"  (1.651 m)   Wt 137 lb (62.1 kg)   BMI 22.80 kg/m   Physical Exam  Constitutional: She is oriented to person, place, and time. She appears well-developed and well-nourished.  HENT:  Head: Normocephalic and atraumatic.  Eyes: EOM are normal. Pupils are equal, round, and reactive to light.  Pulmonary/Chest: Effort normal.  Neurological: She is alert and oriented to person, place, and time.  Skin: Skin  is warm and dry.  Psychiatric: She has a normal mood and affect. Her behavior is normal. Judgment and thought content normal.    Right Shoulder Exam   Tenderness  The patient is experiencing tenderness in the biceps tendon and acromion.  Range of Motion  Active Abduction: 90  Passive Abduction: 130  Forward Flexion: 140  External Rotation: 90  Internal Rotation 90 degrees: 60   Muscle Strength  Abduction: 3/5  Internal Rotation: 4/5  External Rotation: 2/5  Supraspinatus: 3/5   Tests  Impingement: positive  Other  Sensation: normal Pulse: present   Left  Shoulder Exam   Tenderness  The patient is experiencing tenderness in the biceps tendon and acromion.  Range of Motion  Active Abduction: 90  Passive Abduction: 130  Forward Flexion: 140  External Rotation: 90  Internal Rotation 90 degrees: 70   Muscle Strength  Abduction: 4/5  Internal Rotation: 4/5  External Rotation: 4/5  Supraspinatus: 4/5   Tests  Impingement: positive  Other  Sensation: normal Pulse: present       Specialty Comments:  No specialty comments available.  Imaging: Xr Shoulder Left  Result Date: 10/09/2016 Left shoulder 2 views reveals some before meals degenerative changes with inferior spurring of the distal clavicle. Small cystic change in the greater tuberosity area. Type II acromion. Before meals OA.  Xr Shoulder Right  Result Date: 10/09/2016 Two-view x-rays of the right shoulder reveals inferior glenohumeral spurring with cystic change in the inferior glenoid. Adequate distal clavicle excision noted. Type I acromion.    PMFS History: Patient Active Problem List   Diagnosis Date Noted  . Primary osteoarthritis of right knee 01/23/2016  . S/P total knee replacement using cement 01/23/2016  . Acute cystitis 08/23/2014  . Peptic ulcer associated with Helicobacter pylori infection 05/03/2014  . Acute right hip pain 04/28/2014  . Pain of left leg 04/28/2014  . Abdominal pain, epigastric 04/28/2014  . AAA (abdominal aortic aneurysm) without rupture ([facility]) 03/08/2013  . Left otitis media 10/08/2012  . Unilateral occipital headache 04/20/2012  . Vaginitis 02/24/2012  . Left knee pain 11/19/2010  . SHOULDER PAIN, BILATERAL 07/17/2010  . WRIST PAIN, RIGHT 02/15/2010  . Hypothyroidism 08/01/2008  . COPD exacerbation ([facility]) 08/01/2008  . Backache 11/26/2007  . HYPOTHYROIDISM, POST-RADIOACTIVE IODINE 07/20/2007  . TOBACCO ABUSE 07/20/2007  . HOT FLASHES 07/20/2007  . BENIGN POSITIONAL VERTIGO, HX OF 07/20/2007  . INFLAMMATORY DISEASE OF  BREAST 07/19/2007  . FIBROCYSTIC BREAST DISEASE 04/01/2007   Past Medical History:  Diagnosis Date  . Arthritis   . COPD (chronic obstructive pulmonary disease) ([facility])   . GERD (gastroesophageal reflux disease)   . Hyperlipidemia   . Hypothyroidism   . Thyroid disease   . Tobacco abuse   . Vertigo     Family History  Problem Relation Age of Onset  . Thyroid disease Other   . Diabetes Other   . Hyperlipidemia Other   . Hypertension Other     Past Surgical History:  Procedure Laterality Date  . CESAREAN SECTION     x 3  . SHOULDER SURGERY  02/2015  . TOTAL KNEE ARTHROPLASTY Right 01/23/2016   Procedure: TOTAL KNEE ARTHROPLASTY;  Surgeon: Garald Balding, MD;  Location: [facility];  Service: Orthopedics;  Laterality: Right;   Social History   Occupational History  . Retired    Social History Main Topics  . Smoking status: Current Every Day Smoker    Packs/day: 1.00    Years: 50.00  Types: Cigarettes  . Smokeless tobacco: Never Used  . Alcohol use No  . Drug use: No  . Sexual activity: Not on file

## 2016-10-14 ENCOUNTER — Ambulatory Visit (HOSPITAL_COMMUNITY)
Admission: RE | Admit: 2016-10-14 | Discharge: 2016-10-14 | Disposition: A | Discharge status: Home / Self Care | Payer: Medicare HMO | Source: Ambulatory Visit | Attending: Cardiology | Admitting: Cardiology

## 2016-10-14 DIAGNOSIS — I700 Atherosclerosis of aorta: Secondary | ICD-10-CM | POA: Insufficient documentation

## 2016-10-14 DIAGNOSIS — I714 Abdominal aortic aneurysm, without rupture, unspecified: Secondary | ICD-10-CM

## 2016-10-14 DIAGNOSIS — I708 Atherosclerosis of other arteries: Secondary | ICD-10-CM | POA: Diagnosis not present

## 2016-10-16 ENCOUNTER — Other Ambulatory Visit: Payer: Self-pay | Admitting: Family Medicine

## 2016-10-22 NOTE — Progress Notes (Deleted)
Office Visit Note   Patient: Stacy Garcia           Date of Birth: 03-23-1951           MRN: [id number] Visit Date: 10/23/2016              Requested by: [name], NP [address] [place], [place] [address] PCP: [name], NP   Assessment & Plan: Visit Diagnoses: No diagnosis found.  Plan: ***  Follow-Up Instructions: No Follow-up on file.   Orders:  No orders of the defined types were placed in this encounter.  No orders of the defined types were placed in this encounter.     Procedures: No procedures performed   Clinical Data: No additional findings.   Subjective: No chief complaint on file.   FYI:  10/14/16, Pt  On 10/09/16, pt here for evaluation of both shoulders. She's had an arthroscopic subacromial decompression of the right shoulder with distal clavicle excision on 02/09/2015. She states though that the both of her shoulders are hurting her now. Is more symptomatic than her right. She is having problems with range of motion as well as with difficulty sleeping. She has tried anti-inflammatories including that of Aleve and Advil with no benefit. Denies any recent history of injury or trauma. Seen today for evaluation. Corticosteroid injection subacromially to the left shoulder.:  Return in 2 weeks for corticosteroid injection into an extra-articular to the right shoulder.    Review of Systems   Objective: Vital Signs: There were no vitals taken for this visit.  Physical Exam  Ortho Exam  Specialty Comments:  No specialty comments available.  Imaging: No results found.   PMFS History: Patient Active Problem List   Diagnosis Date Noted  . Primary osteoarthritis of right knee 01/23/2016  . S/P total knee replacement using cement 01/23/2016  . Acute cystitis 08/23/2014  . Peptic ulcer associated with Helicobacter pylori infection 05/03/2014  . Acute right hip pain 04/28/2014  . Pain of left leg 04/28/2014  . Abdominal pain,  epigastric 04/28/2014  . AAA (abdominal aortic aneurysm) without rupture ([place]) 03/08/2013  . Left otitis media 10/08/2012  . Unilateral occipital headache 04/20/2012  . Vaginitis 02/24/2012  . Left knee pain 11/19/2010  . SHOULDER PAIN, BILATERAL 07/17/2010  . WRIST PAIN, RIGHT 02/15/2010  . Hypothyroidism 08/01/2008  . COPD exacerbation ([employer]) 08/01/2008  . Backache 11/26/2007  . HYPOTHYROIDISM, POST-RADIOACTIVE IODINE 07/20/2007  . TOBACCO ABUSE 07/20/2007  . HOT FLASHES 07/20/2007  . BENIGN POSITIONAL VERTIGO, HX OF 07/20/2007  . INFLAMMATORY DISEASE OF BREAST 07/19/2007  . FIBROCYSTIC BREAST DISEASE 04/01/2007   Past Medical History:  Diagnosis Date  . Arthritis   . COPD (chronic obstructive pulmonary disease) ([place])   . GERD (gastroesophageal reflux disease)   . Hyperlipidemia   . Hypothyroidism   . Thyroid disease   . Tobacco abuse   . Vertigo     Family History  Problem Relation Age of Onset  . Thyroid disease Other   . Diabetes Other   . Hyperlipidemia Other   . Hypertension Other     Past Surgical History:  Procedure Laterality Date  . CESAREAN SECTION     x 3  . SHOULDER SURGERY  02/2015  . TOTAL KNEE ARTHROPLASTY Right 01/23/2016   Procedure: TOTAL KNEE ARTHROPLASTY;  Surgeon: Garald Balding, MD;  Location: [facility];  Service: Orthopedics;  Laterality: Right;   Social History   Occupational History  . Retired  Social History Main Topics  . Smoking status: Current Every Day Smoker    Packs/day: 1.00    Years: 50.00    Types: Cigarettes  . Smokeless tobacco: Never Used  . Alcohol use No  . Drug use: No  . Sexual activity: Not on file

## 2016-10-23 ENCOUNTER — Other Ambulatory Visit: Payer: Self-pay | Admitting: Family Medicine

## 2016-10-23 ENCOUNTER — Ambulatory Visit (INDEPENDENT_AMBULATORY_CARE_PROVIDER_SITE_OTHER): Payer: Medicare HMO | Admitting: Orthopedic Surgery

## 2016-10-23 VITALS — Ht 65.0 in | Wt 135.0 lb

## 2016-10-23 DIAGNOSIS — Z5329 Procedure and treatment not carried out because of patient's decision for other reasons: Secondary | ICD-10-CM

## 2016-10-24 ENCOUNTER — Other Ambulatory Visit: Payer: Self-pay | Admitting: Adult Health

## 2016-10-24 DIAGNOSIS — I714 Abdominal aortic aneurysm, without rupture, unspecified: Secondary | ICD-10-CM

## 2016-10-25 ENCOUNTER — Telehealth: Payer: Self-pay | Admitting: Emergency Medicine

## 2016-10-25 NOTE — Telephone Encounter (Signed)
Called in Diazepam to the pharmacy.

## 2016-10-30 ENCOUNTER — Ambulatory Visit (INDEPENDENT_AMBULATORY_CARE_PROVIDER_SITE_OTHER): Payer: Medicare HMO | Admitting: Adult Health

## 2016-10-30 ENCOUNTER — Encounter: Payer: Self-pay | Admitting: Adult Health

## 2016-10-30 VITALS — BP 132/80 | Temp 97.6°F | Ht 65.0 in | Wt 134.8 lb

## 2016-10-30 DIAGNOSIS — J069 Acute upper respiratory infection, unspecified: Secondary | ICD-10-CM

## 2016-10-30 MED ORDER — AZITHROMYCIN 250 MG PO TABS: ORAL_TABLET | ORAL | 0 refills | Status: DC

## 2016-10-30 MED ORDER — HYDROCODONE-HOMATROPINE 5-1.5 MG/5ML PO SYRP: 5.0000 mL | ORAL_SOLUTION | Freq: Three times a day (TID) | ORAL | 0 refills | Status: DC | PRN

## 2016-10-30 MED ORDER — METHYLPREDNISOLONE 4 MG PO TBPK: ORAL_TABLET | ORAL | 0 refills | Status: DC

## 2016-10-30 NOTE — Progress Notes (Signed)
Subjective:    Patient ID: Stacy Garcia, female    DOB: 1951/05/20, 66 y.o.   MRN: [id number]  URI   This is a new problem. The current episode started in the past 7 days. The maximum temperature recorded prior to her arrival was 100.4 - 100.9 F. The fever has been present for 1 to 2 days. Associated symptoms include congestion, coughing, headaches, rhinorrhea and sinus pain. Pertinent negatives include no ear pain, nausea, plugged ear sensation or sore throat. Treatments tried: mucinex  The treatment provided no relief.    Review of Systems  Constitutional: Positive for activity change, fatigue and fever.  HENT: Positive for congestion, rhinorrhea, sinus pain and sinus pressure. Negative for ear pain and sore throat.   Respiratory: Positive for cough.   Cardiovascular: Negative.   Gastrointestinal: Negative for nausea.  Neurological: Positive for headaches.   Past Medical History:  Diagnosis Date  . Arthritis   . COPD (chronic obstructive pulmonary disease) ([place])   . GERD (gastroesophageal reflux disease)   . Hyperlipidemia   . Hypothyroidism   . Thyroid disease   . Tobacco abuse   . Vertigo     Social History   Social History  . Marital status: Married    Spouse name: N/A  . Number of children: N/A  . Years of education: N/A   Occupational History  . Retired    Social History Main Topics  . Smoking status: Current Every Day Smoker    Packs/day: 1.00    Years: 50.00    Types: Cigarettes  . Smokeless tobacco: Never Used  . Alcohol use No  . Drug use: No  . Sexual activity: Not on file   Other Topics Concern  . Not on file   Social History Narrative  . No narrative on file    Past Surgical History:  Procedure Laterality Date  . CESAREAN SECTION     x 3  . SHOULDER SURGERY  02/2015  . TOTAL KNEE ARTHROPLASTY Right 01/23/2016   Procedure: TOTAL KNEE ARTHROPLASTY;  Surgeon: Garald Balding, MD;  Location: [facility];  Service: Orthopedics;  Laterality: Right;      Family History  Problem Relation Age of Onset  . Thyroid disease Other   . Diabetes Other   . Hyperlipidemia Other   . Hypertension Other     No Known Allergies  Current Outpatient Prescriptions on File Prior to Visit  Medication Sig Dispense Refill  . acetaminophen (TYLENOL) 500 MG tablet Take 2 tablets (1,000 mg total) by mouth every 6 (six) hours as needed. 30 tablet 0  . albuterol (VENTOLIN HFA) 108 (90 Base) MCG/ACT inhaler Inhale 2 puffs into the lungs every 6 (six) hours as needed for wheezing or shortness of breath. 3 Inhaler 3  . diazepam (VALIUM) 5 MG tablet TAKE 1/2 TO 1 TABLET BY MOUTH TWICE A DAY 30 tablet 1  . levothyroxine (SYNTHROID, LEVOTHROID) 88 MCG tablet Take 1 tablet (88 mcg total) by mouth daily. 90 tablet 3  . omeprazole (PRILOSEC) 40 MG capsule TAKE 1 CAPSULE (40 MG TOTAL) BY MOUTH DAILY. 90 capsule 1  . oxyCODONE (OXY IR/ROXICODONE) 5 MG immediate release tablet Take 1-2 tablets (5-10 mg total) by mouth every 4 (four) hours as needed for moderate pain, severe pain or breakthrough pain. 90 tablet 0  . rivaroxaban (XARELTO) 10 MG TABS tablet Take 1 tablet (10 mg total) by mouth daily with breakfast. 12 tablet 0  . simvastatin (ZOCOR) 40 MG tablet Take  1 tablet (40 mg total) by mouth at bedtime. 90 tablet 3   No current facility-administered medications on file prior to visit.     BP 132/80   Temp 97.6 F (36.4 C) (Oral)   Ht 5\' 5"  (1.651 m)   Wt 134 lb 12.8 oz (61.1 kg)   BMI 22.43 kg/m       Objective:   Physical Exam  Constitutional: She is oriented to person, place, and time. She appears well-developed and well-nourished. No distress.  HENT:  Head: Normocephalic and atraumatic.  Right Ear: External ear normal.  Left Ear: External ear normal.  Nose: Nose normal.  Mouth/Throat: Oropharynx is clear and moist. No oropharyngeal exudate.  Eyes: Conjunctivae and EOM are normal. Pupils are equal, round, and reactive to light. Right eye exhibits no  discharge. Left eye exhibits no discharge.  Neck: Normal range of motion. Neck supple. No thyromegaly present.  Cardiovascular: Normal rate, regular rhythm, normal heart sounds and intact distal pulses.  Exam reveals no gallop and no friction rub.   No murmur heard. Pulmonary/Chest: Effort normal. No respiratory distress. She has wheezes (trace throughout ). She has no rales. She exhibits no tenderness.  Lymphadenopathy:    She has no cervical adenopathy.  Neurological: She is alert and oriented to person, place, and time.  Skin: Skin is warm and dry. No rash noted. She is not diaphoretic. No erythema. No pallor.  Psychiatric: She has a normal mood and affect. Her behavior is normal. Judgment and thought content normal.  Nursing note and vitals reviewed.     Assessment & Plan:  1. Upper respiratory tract infection, unspecified type - Will treat due to smoking history.  - azithromycin (ZITHROMAX Z-PAK) 250 MG tablet; Take 2 tablets on Day 1.  Then take 1 tablet daily.  Dispense: 6 tablet; Refill: 0 - HYDROcodone-homatropine (HYCODAN) 5-1.5 MG/5ML syrup; Take 5 mLs by mouth every 8 (eight) hours as needed for cough.  Dispense: 120 mL; Refill: 0 - methylPREDNISolone (MEDROL DOSEPAK) 4 MG TBPK tablet; Take as directed  Dispense: 21 tablet; Refill: 0 - Follow up if no improvement   [name], NP

## 2016-11-08 ENCOUNTER — Encounter: Payer: Self-pay | Admitting: Vascular Surgery

## 2016-11-20 ENCOUNTER — Ambulatory Visit (INDEPENDENT_AMBULATORY_CARE_PROVIDER_SITE_OTHER): Payer: Medicare HMO | Admitting: Vascular Surgery

## 2016-11-20 ENCOUNTER — Encounter: Payer: Self-pay | Admitting: Vascular Surgery

## 2016-11-20 VITALS — BP 130/86 | HR 103 | Temp 96.7°F | Resp 14 | Ht 65.0 in | Wt 134.0 lb

## 2016-11-20 DIAGNOSIS — I714 Abdominal aortic aneurysm, without rupture, unspecified: Secondary | ICD-10-CM

## 2016-11-20 DIAGNOSIS — F17210 Nicotine dependence, cigarettes, uncomplicated: Secondary | ICD-10-CM

## 2016-11-20 NOTE — Progress Notes (Signed)
Referring Physician: [name], NP  Patient name: Stacy Garcia MRN: [id number] DOB: [birth year] Sex: female  REASON FOR CONSULT: Abdominal aortic aneurysm  HPI: Stacy Garcia is a 66 y.o. female with known abdominal aortic aneurysm which has slowly grown over the last few years. Her aneurysm was discovered in 2014. It was 3.8 cm in diameter at that point. Recent ultrasound showed that it had grown to 5.3 cm in diameter. The patient has some occasional abdominal pain but this seems chronic in nature. It is not associated with eating. She denies food fear. She denies weight loss. She states her appetite has not been very good over the last 6 months after having a total knee replacement. She thinks some of this may be stress as her sister has stage IV lung cancer and she is taking care of 2 grandchildren.  She does have some back pain but this is again chronic in nature and has not changed any in the last few months. She does smoke and really does not have any interest in quitting. Greater than 3 minutes today regarding smoking cessation counseling. She denies any family history of abdominal aortic aneurysm. Her only prior abdominal operation was a C-section. Other medical problems include multi-joint arthritis, COPD, hyperlipidemia all of which are currently stable. She had pulmonary function tests February 2017 performed prior to her total knee replacement. Her FEV1 was 1.12 which was 45% of predicted.  Past Medical History:  Diagnosis Date  . Arthritis   . COPD (chronic obstructive pulmonary disease) ([place])   . GERD (gastroesophageal reflux disease)   . Hyperlipidemia   . Hypothyroidism   . Thyroid disease   . Tobacco abuse   . Vertigo    Past Surgical History:  Procedure Laterality Date  . CESAREAN SECTION     x 3  . SHOULDER SURGERY  02/2015  . TOTAL KNEE ARTHROPLASTY Right 01/23/2016   Procedure: TOTAL KNEE ARTHROPLASTY;  Surgeon: Garald Balding, MD;  Location: [facility];  Service:  Orthopedics;  Laterality: Right;    Family History  Problem Relation Age of Onset  . Thyroid disease Other   . Diabetes Other   . Hyperlipidemia Other   . Hypertension Other     SOCIAL HISTORY: Social History   Social History  . Marital status: Married    Spouse name: N/A  . Number of children: N/A  . Years of education: N/A   Occupational History  . Retired    Social History Main Topics  . Smoking status: Current Every Day Smoker    Packs/day: 1.00    Years: 50.00    Types: Cigarettes  . Smokeless tobacco: Never Used  . Alcohol use No  . Drug use: No  . Sexual activity: Not on file   Other Topics Concern  . Not on file   Social History Narrative  . No narrative on file    No Known Allergies  Current Outpatient Prescriptions  Medication Sig Dispense Refill  . acetaminophen (TYLENOL) 500 MG tablet Take 2 tablets (1,000 mg total) by mouth every 6 (six) hours as needed. 30 tablet 0  . albuterol (VENTOLIN HFA) 108 (90 Base) MCG/ACT inhaler Inhale 2 puffs into the lungs every 6 (six) hours as needed for wheezing or shortness of breath. 3 Inhaler 3  . diazepam (VALIUM) 5 MG tablet TAKE 1/2 TO 1 TABLET BY MOUTH TWICE A DAY 30 tablet 1  . HYDROcodone-homatropine (HYCODAN) 5-1.5 MG/5ML syrup Take 5 mLs by mouth  every 8 (eight) hours as needed for cough. 120 mL 0  . levothyroxine (SYNTHROID, LEVOTHROID) 88 MCG tablet Take 1 tablet (88 mcg total) by mouth daily. 90 tablet 3  . methylPREDNISolone (MEDROL DOSEPAK) 4 MG TBPK tablet Take as directed 21 tablet 0  . omeprazole (PRILOSEC) 40 MG capsule TAKE 1 CAPSULE (40 MG TOTAL) BY MOUTH DAILY. 90 capsule 1  . oxyCODONE (OXY IR/ROXICODONE) 5 MG immediate release tablet Take 1-2 tablets (5-10 mg total) by mouth every 4 (four) hours as needed for moderate pain, severe pain or breakthrough pain. 90 tablet 0  . rivaroxaban (XARELTO) 10 MG TABS tablet Take 1 tablet (10 mg total) by mouth daily with breakfast. 12 tablet 0  .  simvastatin (ZOCOR) 40 MG tablet Take 1 tablet (40 mg total) by mouth at bedtime. 90 tablet 3  . azithromycin (ZITHROMAX Z-PAK) 250 MG tablet Take 2 tablets on Day 1.  Then take 1 tablet daily. (Patient not taking: Reported on 11/20/2016) 6 tablet 0   No current facility-administered medications for this visit.     ROS:   General:  No weight loss, Fever, chills  HEENT: No recent headaches, no nasal bleeding, no visual changes, no sore throat  Neurologic: No dizziness, blackouts, seizures. No recent symptoms of stroke or mini- stroke. No recent episodes of slurred speech, or temporary blindness.  Cardiac: No recent episodes of chest pain/pressure, no shortness of breath at rest.  + shortness of breath with exertion.  Denies history of atrial fibrillation or irregular heartbeat  Vascular: No history of rest pain in feet.  No history of claudication.  No history of non-healing ulcer, No history of DVT   Pulmonary: No home oxygen, no productive cough, no hemoptysis,  No asthma or wheezing  Musculoskeletal:  [X]  Arthritis, [X]  Low back pain,  [X]  Joint pain  Hematologic:No history of hypercoagulable state.  No history of easy bleeding.  No history of anemia  Gastrointestinal: No hematochezia or melena,  No gastroesophageal reflux, no trouble swallowing  Urinary: [ ]  chronic Kidney disease, [ ]  on HD - [ ]  MWF or [ ]  TTHS, [ ]  Burning with urination, [ ]  Frequent urination, [ ]  Difficulty urinating;   Skin: No rashes  Psychological: No history of anxiety,  No history of depression   Physical Examination  Vitals:   11/20/16 1258  BP: 130/86  Pulse: (!) 103  Resp: 14  Temp: (!) 96.7 F (35.9 C)  SpO2: 98%  Weight: 134 lb (60.8 kg)  Height: 5\' 5"  (1.651 m)    Body mass index is 22.3 kg/m.  General:  Alert and oriented, no acute distress HEENT: Normal, coarse raspy hoarse voice (patient states is chronic) Neck: No bruit or JVD Pulmonary: Clear to auscultation  bilaterally Cardiac: Regular Rate and Rhythm without murmur Abdomen: Soft, non-tender, non-distended, no mass, well-healed lower midline scar  Skin: No rash Extremity Pulses:  2+ radial, brachial, femoral, dorsalis pedis pulses bilaterally absent posterior tibial pulses Musculoskeletal: No deformity or edema  Neurologic: Upper and lower extremity motor 5/5 and symmetric  DATA:  I reviewed the patient's aortic ultrasound which showed an aortic diameter 5.3 cm.  ASSESSMENT:  Patient's aneurysm has grown to a size that it is worth considering repair at this point. However, she does have some pulmonary compromise from COPD. If she is not a stent graft candidate we may defer repairing her until the aneurysm grows a little larger due to her pulmonary dysfunction.   PLAN:  CT angiogram  abdomen and pelvis and then see me back in follow-up to determine whether or not she would be a candidate for aneurysm stent graft repair.   Ruta Hinds, MD [facility] Office: [phone] Pager: [phone]

## 2016-11-28 ENCOUNTER — Encounter: Payer: Self-pay | Admitting: Vascular Surgery

## 2016-11-28 ENCOUNTER — Ambulatory Visit (INDEPENDENT_AMBULATORY_CARE_PROVIDER_SITE_OTHER): Payer: Medicare HMO | Admitting: Vascular Surgery

## 2016-11-28 ENCOUNTER — Ambulatory Visit
Admission: RE | Admit: 2016-11-28 | Discharge: 2016-11-28 | Disposition: A | Discharge status: Home / Self Care | Payer: Medicare HMO | Source: Ambulatory Visit | Attending: Vascular Surgery | Admitting: Vascular Surgery

## 2016-11-28 VITALS — BP 117/83 | HR 96 | Temp 97.7°F | Resp 16 | Ht 65.0 in | Wt 137.0 lb

## 2016-11-28 DIAGNOSIS — I714 Abdominal aortic aneurysm, without rupture, unspecified: Secondary | ICD-10-CM

## 2016-11-28 DIAGNOSIS — Z01812 Encounter for preprocedural laboratory examination: Secondary | ICD-10-CM

## 2016-11-28 MED ORDER — IOPAMIDOL (ISOVUE-370) INJECTION 76%
75.0000 mL | Freq: Once | INTRAVENOUS | Status: AC | PRN
  Administered 2016-11-28: 75 mL via INTRAVENOUS

## 2016-11-28 NOTE — Progress Notes (Signed)
Patient is a 66 year old female who returns for follow-up today. She has a known abdominal aortic aneurysm which has grown recently. She was seen in the office last week and ultrasound suggested the aneurysm had reached 5.3 cm in diameter today she returns today after follow-up CT scan. She continues to deny any new onset abdominal or back pain. Unfortunately her sister died yesterday but this was not unexpected.  She also has fairly significant underlying pulmonary dysfunction and COPD with her FEV1 being 1.12 recently 45% of predicted February 2017.  Review of systems: She has shortness of breath with exertion. She denies chest pain. She is mildly depressed today over the death of her sister.  Physical exam:  Vitals:   11/28/16 1300  BP: 117/83  Pulse: 96  Resp: 16  Temp: 97.7 F (36.5 C)  TempSrc: Oral  SpO2: 96%  Weight: 137 lb (62.1 kg)  Height: 5\' 5"  (1.651 m)    Abdomen: Soft nontender nondistended. Pulsatile Mass not obvious  Extremities: 2+ femoral pulses  data: I reviewed the patient's CT angiogram today. This shows a tortuous neck segment.  There is a reversed taper. The aneurysm is 5.8 cm in diameter. External iliacs and common femorals are about 6 mm in diameter.  Assessment: 5.8 cm infrarenal abdominal aortic aneurysm.  I reviewed the patient's CT scan today with the [employer] as well as my partner Dr. Trula Slade. We all have concerns that a stent graft may not fit appropriately into her aorta. If we do not get an adequate seal we could make her overall worse. She does have significant underlying pulmonary dysfunction. However, I believe her best option would be an open aneurysm repair. I discussed this with the patient today. She is going to think about this. In light of this we will also schedule her for an appointment with Dr.Sood from pulmonary to get a risk assessment of her having significant pulmonary complications with open aneurysm repair. She will call me back  within the next few weeks with her decision on whether or not she wishes to pursue this.  Plan:  See above  Ruta Hinds, MD [facility] Office: [phone] Pager: [phone]

## 2016-11-29 ENCOUNTER — Telehealth: Payer: Self-pay | Admitting: Vascular Surgery

## 2016-11-29 NOTE — Telephone Encounter (Signed)
-----   Message from Gregery Na, RN sent at 11/29/2016 11:03 AM EDT ----- Would you please schedule Ms Mccormick an office visit with Dr. Halford Chessman? Please see Dr. Oneida Alar message below.  Thanks, Colletta Maryland ----- Message ----- From: Elam Dutch, MD Sent: 11/28/2016   5:25 PM To: Gregery Na, RN  Colletta Maryland,  Can you get her an appt with Dr Halford Chessman pulmonary.  Thanks

## 2016-11-29 NOTE — Telephone Encounter (Signed)
Sched appt 12/10/16 at 2:15. She was already established. They said that there was nothing w/ Dr. Halford Chessman so they sched her with the NP Tammy Parrett. They will contact the pt.

## 2016-12-10 ENCOUNTER — Encounter: Payer: Self-pay | Admitting: Adult Health

## 2016-12-10 ENCOUNTER — Ambulatory Visit (INDEPENDENT_AMBULATORY_CARE_PROVIDER_SITE_OTHER): Payer: Medicare HMO | Admitting: Adult Health

## 2016-12-10 ENCOUNTER — Ambulatory Visit (INDEPENDENT_AMBULATORY_CARE_PROVIDER_SITE_OTHER)
Admission: RE | Admit: 2016-12-10 | Discharge: 2016-12-10 | Disposition: A | Discharge status: Home / Self Care | Payer: Medicare HMO | Source: Ambulatory Visit | Attending: Adult Health | Admitting: Adult Health

## 2016-12-10 VITALS — BP 112/70 | HR 96 | Ht 65.0 in | Wt 132.8 lb

## 2016-12-10 DIAGNOSIS — J441 Chronic obstructive pulmonary disease with (acute) exacerbation: Secondary | ICD-10-CM | POA: Diagnosis not present

## 2016-12-10 DIAGNOSIS — Z01818 Encounter for other preprocedural examination: Secondary | ICD-10-CM

## 2016-12-10 DIAGNOSIS — R918 Other nonspecific abnormal finding of lung field: Secondary | ICD-10-CM | POA: Diagnosis not present

## 2016-12-10 NOTE — Progress Notes (Signed)
@Patient  ID: Stacy Garcia, female    DOB: 1951/07/02, 65 y.o.   MRN: [id number]  Chief Complaint  Patient presents with  . Follow-up    Emyphysema     Referring provider: [name], NP  HPI: 66 year old female, smoker seen for initial pulmonary consult February 2017 for COPD and preop clearance for knee surgery .   TEST  Tests PFT 10/23/15 >> FEV1 1.25 (50%), FEV1% 69, TLC 4.54 (87%), DLCO 52%, mild  BD response (11%)   12/11/2016 Follow up : COPD/Preop clearance for AAA  Patient presents for a follow-up visit. Patient was seen in February 2017 for evaluation of COPD and preop clearance for knee surgery.  She had knee replacement in 01/2016 , did well. Had Spinal anesthesia. Reports no known pulmonary complications.  Pulmonary function tests  10/2015 showed moderate COPD with an FEV1 at 50%, ratio 69. And a DLCO at 52%. She was recommended for smoking cessation. And to try albuterol as needed. She was felt to have increased risk of perioperative pulmonary complications but was not felt to be prohibitive.  Pt has been referred today for pulmonary preop evaluation for AAA surgery .  Patient has been found to have an enlarging AAA. Recent CT angiogram abdomen and pelvis showed a 5.8 cm abdominal aortic aneurysm. She is followed by Dr. Oneida Alar at [facility] . She is being considered open aneurysm repair.   She continues to smoke, ~1 PPD. Smoking cessation discussed.  Spirometry today is unchanged today with moderate COPD ,w/  FEV 1 50%. Last CXR 2017 w/ chronic changes . CXR today w/ COPD changes.  She is independent , lives at home with husband. Drives, does housework.  Intermittent exercises. babysits 3 and 35 yr old grandkids.  No ER or hospitalizations for breathing in last year.  Had URI , tx w/ abx ~2 months ago.  Has Ventolin inhaler but does not use.  No dyspnea. Walks up steps with no dyspnea. If she carries heavy does get some winded.      No Known Allergies  Immunization  History  Administered Date(s) Administered  . Influenza Whole 07/20/2007, 07/17/2010  . Influenza,inj,Quad PF,36+ Mos 09/01/2013, 10/26/2015  . Pneumococcal Polysaccharide-23 08/01/2008, 10/26/2015  . Td 09/09/2001  . Tdap 02/24/2012    Past Medical History:  Diagnosis Date  . Arthritis   . COPD (chronic obstructive pulmonary disease) ([place])   . GERD (gastroesophageal reflux disease)   . Hyperlipidemia   . Hypothyroidism   . Thyroid disease   . Tobacco abuse   . Vertigo     Tobacco History: History  Smoking Status  . Current Every Day Smoker  . Packs/day: 1.00  . Years: 50.00  . Types: Cigarettes  Smokeless Tobacco  . Never Used   Ready to quit: No Counseling given: Yes   Outpatient Encounter Prescriptions as of 12/10/2016  Medication Sig  . albuterol (VENTOLIN HFA) 108 (90 Base) MCG/ACT inhaler Inhale 2 puffs into the lungs every 6 (six) hours as needed for wheezing or shortness of breath.  Marland Kitchen aspirin EC 81 MG tablet Take 81 mg by mouth daily.  . diazepam (VALIUM) 5 MG tablet TAKE 1/2 TO 1 TABLET BY MOUTH TWICE A DAY  . levothyroxine (SYNTHROID, LEVOTHROID) 88 MCG tablet Take 1 tablet (88 mcg total) by mouth daily.  . simvastatin (ZOCOR) 40 MG tablet Take 1 tablet (40 mg total) by mouth at bedtime.  . [DISCONTINUED] acetaminophen (TYLENOL) 500 MG tablet Take 2 tablets (1,000 mg total) by  mouth every 6 (six) hours as needed.  . [DISCONTINUED] HYDROcodone-homatropine (HYCODAN) 5-1.5 MG/5ML syrup Take 5 mLs by mouth every 8 (eight) hours as needed for cough.  . [DISCONTINUED] omeprazole (PRILOSEC) 40 MG capsule TAKE 1 CAPSULE (40 MG TOTAL) BY MOUTH DAILY.  . [DISCONTINUED] azithromycin (ZITHROMAX Z-PAK) 250 MG tablet Take 2 tablets on Day 1.  Then take 1 tablet daily.  . [DISCONTINUED] methylPREDNISolone (MEDROL DOSEPAK) 4 MG TBPK tablet Take as directed  . [DISCONTINUED] oxyCODONE (OXY IR/ROXICODONE) 5 MG immediate release tablet Take 1-2 tablets (5-10 mg total) by mouth  every 4 (four) hours as needed for moderate pain, severe pain or breakthrough pain.  . [DISCONTINUED] rivaroxaban (XARELTO) 10 MG TABS tablet Take 1 tablet (10 mg total) by mouth daily with breakfast.   No facility-administered encounter medications on file as of 12/10/2016.      Review of Systems  Constitutional:   No  weight loss, night sweats,  Fevers, chills, fatigue, or  lassitude.  HEENT:   No headaches,  Difficulty swallowing,  Tooth/dental problems, or  Sore throat,                No sneezing, itching, ear ache, nasal congestion, post nasal drip,   CV:  No chest pain,  Orthopnea, PND, swelling in lower extremities, anasarca, dizziness, palpitations, syncope.   GI  No heartburn, indigestion, abdominal pain, nausea, vomiting, diarrhea, change in bowel habits, loss of appetite, bloody stools.   Resp: No shortness of breath with exertion or at rest.  No excess mucus, no productive cough,  No non-productive cough,  No coughing up of blood.  No change in color of mucus.  No wheezing.  No chest wall deformity  Skin: no rash or lesions.  GU: no dysuria, change in color of urine, no urgency or frequency.  No flank pain, no hematuria   MS:  No joint pain or swelling.  No decreased range of motion.  No back pain.    Physical Exam  BP 112/70 (BP Location: Left Arm, Cuff Size: Normal)   Pulse 96   Ht 5\' 5"  (1.651 m)   Wt 132 lb 12.8 oz (60.2 kg)   SpO2 97%   BMI 22.10 kg/m   GEN: A/Ox3; pleasant , NAD, thin    HEENT:  [place]/AT,  EACs-clear, TMs-wnl, NOSE-clear, THROAT-clear, no lesions, no postnasal drip or exudate noted.   NECK:  Supple w/ fair ROM; no JVD; normal carotid impulses w/o bruits; no thyromegaly or nodules palpated; no lymphadenopathy.    RESP  Clear  P & A; w/o, wheezes/ rales/ or rhonchi. no accessory muscle use, no dullness to percussion  CARD:  RRR, no m/r/g, no peripheral edema, pulses intact, no cyanosis or clubbing.  GI:   Soft & nt; nml bowel sounds; no  organomegaly or masses detected.   Musco: Warm bil, no deformities or joint swelling noted.   Neuro: alert, no focal deficits noted.    Skin: Warm, no lesions or rashes   Lab Results:  CBC    Component Value Date/Time   WBC 7.1 06/20/2016 0916   RBC 5.22 (H) 06/20/2016 0916   HGB 16.3 (H) 06/20/2016 0916   HCT 47.8 (H) 06/20/2016 0916   PLT 225.0 06/20/2016 0916   MCV 91.5 06/20/2016 0916   MCH 32.8 01/25/2016 0723   MCHC 34.1 06/20/2016 0916   RDW 14.8 06/20/2016 0916   LYMPHSABS 2.8 06/20/2016 0916   MONOABS 0.3 06/20/2016 0916   EOSABS 0.3 06/20/2016 0916  BASOSABS 0.0 06/20/2016 0916    BMET    Component Value Date/Time   NA 142 06/20/2016 0916   K 4.3 06/20/2016 0916   CL 105 06/20/2016 0916   CO2 31 06/20/2016 0916   GLUCOSE 90 06/20/2016 0916   GLUCOSE 87 07/15/2006 1043   BUN 18 06/20/2016 0916   CREATININE 0.76 06/20/2016 0916   CALCIUM 9.5 06/20/2016 0916   GFRNONAA >60 01/25/2016 0723   GFRAA >60 01/25/2016 0723     [place] Postperative Pulmonary Risk Score Comment Score  Type of surgery - abd ao aneurysm (27), thoracic (21), neurosurgery / upper abdominal / vascular (21), neck (11) Elective  27  Emergency Surgery - (11) Elective  0  ALbumin < 3 or poor nutritional state - (9) nml  0  BUN > 30 -  (8) nml  0  Partial or completely dependent functional status - (7) Independent  0  COPD -  (6) COPD  6  Age - 60 to 68 (4), > 70  (6) 68  4  TOTAL    Risk Stratifcation scores  - < 10, 11-19, 20-27, 28-40, >40 High  37    BNP No results found for: BNP  ProBNP No results found for: PROBNP  Imaging: Dg Chest 2 View  Result Date: 12/10/2016 CLINICAL DATA:  Preoperative examination prior to abdominal surgery. History of COPD, current smoker. EXAM: CHEST  2 VIEW COMPARISON:  Chest x-ray of October 16, 2015 FINDINGS: The lungs are well-expanded. There is no focal infiltrate. There is no pleural effusion. The heart and pulmonary vascularity are  normal. The mediastinum is normal in width. The trachea is midline. The bony thorax exhibits no acute abnormality. IMPRESSION: There is no active cardiopulmonary disease. Electronically Signed   By: David  Martinique M.D.   On: 12/10/2016 15:56   Ct Angio Abdomen Pelvis  W &/or Wo Contrast  Result Date: 11/28/2016 CLINICAL DATA:  Eval AAA. Pre-stent. Back pain. Constipation, 10 # wt loss. C sec x 3. No other abd surg or hx ca. No prev CT. [place] in pacs. EXAM: CTA ABDOMEN AND PELVIS WITH CONTRAST TECHNIQUE: Multidetector CT imaging of the abdomen and pelvis was performed using the standard protocol during bolus administration of intravenous contrast. Multiplanar reconstructed images and MIPs were obtained and reviewed to evaluate the vascular anatomy. CONTRAST:  75 mL Isovue 370 IV Creatinine was obtained on site at [facility] at [address]Results: Creatinine 0.9 bun 12, GFR = 66mg /dL. COMPARISON:  None. FINDINGS: VASCULAR Coronary calcifications. Aorta: Tortuous visualized distal descending thoracic segment. Atheromatous plaque and mural thrombus in the suprarenal segment. Bilobed aneurysm with the lesser component juxtarenal measuring 2.5 cm diameter, the inferior dominant component measuring 5.8 cm x5.7 maximum transverse dimensions, tapering to a diameter of 16 mm just above the bifurcation. There is a relatively small amount of eccentric mural thrombus in the larger aneurysmal segment. No dissection or stenosis. Celiac: Patent without evidence of aneurysm, dissection, vasculitis or significant stenosis. SMA: Patent without evidence of aneurysm, dissection, vasculitis or significant stenosis. Renals: Duplicated left renal arteries, superior dominant with mild ostial plaque but no significant stenosis. Inferior left renal artery is more diminutive, arising at the margin of the aortic aneurysm, supplying a portion of the lower pole. Single right renal artery with mild nonocclusive ostial plaque,  proximal bifurcation into anterior and posterior divisions, patent distally. IMA: Patent, arising from the aneurysmal segment of the aorta Inflow: Calcified plaque at the aortic bifurcation and eccentric plaque through  both common iliac arteries, without stenosis or aneurysm. Patent bilateral internal iliac arteries without aneurysm. Mild nonocclusive plaque through the external iliac arteries without aneurysm or stenosis. Proximal Outflow: Mild eccentric plaque in bilateral common femoral arteries, without significant stenosis. Visualized portions of SFA and profunda femoral arteries patent. Veins: Dedicated venous phase not obtained. Patent portal vein, bilateral renal veins, and IVC noted. Bilateral pelvic phleboliths. Review of the MIP images confirms the above findings. NON-VASCULAR Lower chest: No acute abnormality. Hepatobiliary: No focal liver abnormality is seen. No gallstones, gallbladder wall thickening, or biliary dilatation. Pancreas: Unremarkable. No pancreatic ductal dilatation or surrounding inflammatory changes. Spleen: Normal in size without focal abnormality. Adrenals/Urinary Tract: 11 mm probable cyst in the upper pole right kidney, 10 mm probable cyst upper pole left kidney. No hydronephrosis or solid renal mass. Urinary bladder incompletely distended. Stomach/Bowel: Stomach is within normal limits. Appendix appears normal. No evidence of bowel wall thickening, distention, or inflammatory changes. Lymphatic: No adenopathy localized. Reproductive: Uterus and bilateral adnexa are unremarkable. Other: No ascites.  No free air. Musculoskeletal: Mild lumbar scoliosis. Mild narrowing of interspaces L1-L5. Negative for fracture or worrisome bone lesion. IMPRESSION: VASCULAR 1. 5.8 cm bilobed juxtarenal/ infrarenal abdominal aortic aneurysm. 2. Mild atheromatous plaque in the iliac arterial system without aneurysm, stenosis, or marked tortuosity. 3. Coronary calcifications. NON-VASCULAR 1. No acute  findings 2. Small renal cysts. 3. Lumbar spondylitic changes. Electronically Signed   By: Lucrezia Europe M.D.   On: 11/28/2016 12:23     Assessment & Plan:   COPD (chronic obstructive pulmonary disease) ([place]) GOLD 2 /Moderate COPD in active smoker.No change in lung function over last year despite heavy smoking .  She remains independent , not on oxygen or controller medication. She is essentially asymptomatic. She is higher risk of complications with respiratory infections, advised to seek medical attention if she develops resp infections.  encourged on smoking cessation . Remain active.  Use Ventolin As needed  .  Continue to check spirometry periodically . If becomes sx would consider to add controller inhaler   Plan  Cont on current regimen  follow up Dr. Halford Chessman  In  1 year and As needed     Preoperative clearance Pulmonary Preop Clearance  For open repair of AAA .  Pt has Moderate COPD /GOLD 2 COPD . No significant decline in lung function over last year despite smoking .  She is independent , not on O2 , or controller rx. She remains essentially asymptomatic.  She is at high pulmonary risk for surgery due to underlying COPD but not prohibitive .  Explained in detail potential pulmonary risks with pt including increased risk for pneumonia; b) recurrent intubation risk; c) prolonged or recurrent acute respiratory failure needing mechanical ventilation; d) prolonged hospitalization; e) DVT/Pulmonary embolism; f) Acute Pulmonary edema  Would recommend :  1. Short duration of surgery as much as possible and avoid paralytic if possible 2. Recovery in step down or ICU with Pulmonary consultation if needed.  3. DVT prophylaxis 4. Aggressive pulmonary toilet with o2, bronchodilatation, and incentive spirometry and early ambulation  With pt smoking hx may have  cardiovascular risk factors as well -may need cardiovascular clearance   Case discussed with Dr. Annamaria Boots  (in Dr. Halford Chessman  Absence ) with  agreement .   Advised pt on smoking cessation and absolutely no smoking 1 week prior to surgery .   Pulmonary /PCCM will be available during surgery . Call if needed.  Rexene Edison, NP 12/11/2016

## 2016-12-10 NOTE — Patient Instructions (Signed)
Chest xray today.  Lung function is stable  Goal is to quit smoking .  May use Albuterol .As needed   I will be in touch regarding your surgery .  follow up Dr. Halford Chessman  In 1 year and As needed

## 2016-12-11 DIAGNOSIS — Z01818 Encounter for other preprocedural examination: Secondary | ICD-10-CM | POA: Insufficient documentation

## 2016-12-11 NOTE — Assessment & Plan Note (Signed)
GOLD 2 /Moderate COPD in active smoker.No change in lung function over last year despite heavy smoking .  She remains independent , not on oxygen or controller medication. She is essentially asymptomatic. She is higher risk of complications with respiratory infections, advised to seek medical attention if she develops resp infections.  encourged on smoking cessation . Remain active.  Use Ventolin As needed  .  Continue to check spirometry periodically . If becomes sx would consider to add controller inhaler   Plan  Cont on current regimen  follow up Dr. Halford Chessman  In  1 year and As needed

## 2016-12-11 NOTE — Assessment & Plan Note (Signed)
Pulmonary Preop Clearance  For open repair of AAA .  Pt has Moderate COPD /GOLD 2 COPD . No significant decline in lung function over last year despite smoking .  She is independent , not on O2 , or controller rx. She remains essentially asymptomatic.  She is at high pulmonary risk for surgery due to underlying COPD but not prohibitive .  Explained in detail potential pulmonary risks with pt including increased risk for pneumonia; b) recurrent intubation risk; c) prolonged or recurrent acute respiratory failure needing mechanical ventilation; d) prolonged hospitalization; e) DVT/Pulmonary embolism; f) Acute Pulmonary edema  Would recommend :  1. Short duration of surgery as much as possible and avoid paralytic if possible 2. Recovery in step down or ICU with Pulmonary consultation if needed.  3. DVT prophylaxis 4. Aggressive pulmonary toilet with o2, bronchodilatation, and incentive spirometry and early ambulation  With pt smoking hx may have  cardiovascular risk factors as well -may need cardiovascular clearance   Case discussed with Dr. Annamaria Boots  (in Dr. Halford Chessman  Absence ) with agreement .   Advised pt on smoking cessation and absolutely no smoking 1 week prior to surgery .   Pulmonary /PCCM will be available during surgery . Call if needed.

## 2016-12-15 NOTE — Progress Notes (Signed)
I have reviewed and agree with assessment/plan.  Chesley Mires, MD [facility] Pulmonary/Critical Care 12/15/2016, 11:37 AM Pager:  [phone]

## 2016-12-19 ENCOUNTER — Other Ambulatory Visit (HOSPITAL_COMMUNITY): Payer: Medicare HMO

## 2016-12-19 ENCOUNTER — Encounter: Payer: Medicare HMO | Admitting: Vascular Surgery

## 2017-01-01 ENCOUNTER — Encounter: Payer: Self-pay | Admitting: Vascular Surgery

## 2017-01-09 ENCOUNTER — Encounter: Payer: Self-pay | Admitting: Vascular Surgery

## 2017-01-09 ENCOUNTER — Ambulatory Visit (INDEPENDENT_AMBULATORY_CARE_PROVIDER_SITE_OTHER): Payer: Medicare HMO | Admitting: Vascular Surgery

## 2017-01-09 ENCOUNTER — Ambulatory Visit (INDEPENDENT_AMBULATORY_CARE_PROVIDER_SITE_OTHER): Payer: Medicare HMO | Admitting: Orthopaedic Surgery

## 2017-01-09 ENCOUNTER — Encounter (INDEPENDENT_AMBULATORY_CARE_PROVIDER_SITE_OTHER): Payer: Self-pay | Admitting: Orthopaedic Surgery

## 2017-01-09 VITALS — BP 117/78 | HR 76 | Resp 14 | Ht 66.0 in | Wt 145.0 lb

## 2017-01-09 VITALS — BP 138/92 | HR 76 | Temp 96.9°F | Resp 18 | Ht 65.0 in | Wt 137.6 lb

## 2017-01-09 DIAGNOSIS — Z96651 Presence of right artificial knee joint: Secondary | ICD-10-CM | POA: Diagnosis not present

## 2017-01-09 DIAGNOSIS — I714 Abdominal aortic aneurysm, without rupture, unspecified: Secondary | ICD-10-CM

## 2017-01-09 NOTE — Progress Notes (Signed)
Office Visit Note   Patient: Stacy Garcia           Date of Birth: 1951/02/23           MRN: [id number] Visit Date: 01/09/2017              Requested by: [name], NP [address], [place] [id number] PCP: [name], NP   Assessment & Plan: Visit Diagnoses:  1. S/P TKR (total knee replacement) using cement, right     Plan:  #1: She is having an aneurysm resection in the very near future and we've discussed with her about antibiotics for the surgery. #2: Follow back up when necessary    Follow-Up Instructions: Return if symptoms worsen or fail to improve.   Orders:  No orders of the defined types were placed in this encounter.  No orders of the defined types were placed in this encounter.     Procedures: No procedures performed   Clinical Data: No additional findings.   Subjective: Chief Complaint  Patient presents with  . Right Knee - Follow-up    Stacy Garcia is a 66 y o status post 1 yr Right total knee replacement. She relates has some pain with bending or moving a certain way, no issues. She is having surgery with Dr. Oneida Alar for anyoursym    Stacy Garcia is a 66 -year-old white female now 1 year status post Right total knee replacement. She relates has some pain with bending or moving a certain way ut she states overall she really has no issues. She states that the right knee is exceedingly improved and she is extremely happy with her results. She is having surgery with Dr. Oneida Alar for abdominal aortic aneurysm through the abdomen.     Review of Systems  Constitutional: Negative.        History of sleep apnea  HENT: Negative.   Respiratory:       Cigarette smoker  Cardiovascular:       History of abdominal aortic aneurysm scheduled for resection  Gastrointestinal: Negative.   Genitourinary: Negative.   Skin: Negative.   Neurological: Negative.   Hematological: Negative.   Psychiatric/Behavioral: Negative.       Objective: Vital Signs: BP 117/78   Pulse 76   Resp 14   Ht 5\' 6"  (1.676 m)   Wt 145 lb (65.8 kg)   BMI 23.40 kg/m   Physical Exam  Constitutional: She is oriented to person, place, and time. She appears well-developed and well-nourished.  HENT:  Head: Normocephalic and atraumatic.  Eyes: EOM are normal. Pupils are equal, round, and reactive to light.  Pulmonary/Chest: Effort normal.  Musculoskeletal:       Right knee: She exhibits no effusion.  Neurological: She is alert and oriented to person, place, and time.  Skin: Skin is warm and dry.  Psychiatric: She has a normal mood and affect. Her behavior is normal. Judgment and thought content normal.    Right Knee Exam   Tenderness  The patient is experiencing no tenderness.     Range of Motion  Extension: 0  Right knee flexion: 105.   Tests  Drawer:       Anterior - trace    Posterior - trace Varus: negative Valgus: negative Patellar Apprehension: negative  Other  Swelling: none Other tests: no effusion present  Comments:  Smooth motion without crepitance. Good ligamentous stability.No effusion      Specialty Comments:  No specialty comments available.  Imaging: No results found.  Current Outpatient Prescriptions  Medication Sig Dispense Refill  . albuterol (VENTOLIN HFA) 108 (90 Base) MCG/ACT inhaler Inhale 2 puffs into the lungs every 6 (six) hours as needed for wheezing or shortness of breath. 3 Inhaler 3  . aspirin EC 81 MG tablet Take 81 mg by mouth daily.    . diazepam (VALIUM) 5 MG tablet TAKE 1/2 TO 1 TABLET BY MOUTH TWICE A DAY 30 tablet 1  . levothyroxine (SYNTHROID, LEVOTHROID) 88 MCG tablet Take 1 tablet (88 mcg total) by mouth daily. 90 tablet 3  . simvastatin (ZOCOR) 40 MG tablet Take 1 tablet (40 mg total) by mouth at bedtime. 90 tablet 3   No current facility-administered medications for this visit.      PMFS History: Patient Active Problem List   Diagnosis Date Noted  .  Preoperative clearance 12/11/2016  . Primary osteoarthritis of right knee 01/23/2016  . S/P total knee replacement using cement 01/23/2016  . Acute cystitis 08/23/2014  . Peptic ulcer associated with Helicobacter pylori infection 05/03/2014  . Acute right hip pain 04/28/2014  . Pain of left leg 04/28/2014  . Abdominal pain, epigastric 04/28/2014  . AAA (abdominal aortic aneurysm) without rupture ([place]) 03/08/2013  . Left otitis media 10/08/2012  . Unilateral occipital headache 04/20/2012  . Vaginitis 02/24/2012  . Left knee pain 11/19/2010  . SHOULDER PAIN, BILATERAL 07/17/2010  . WRIST PAIN, RIGHT 02/15/2010  . Hypothyroidism 08/01/2008  . COPD (chronic obstructive pulmonary disease) ([place]) 08/01/2008  . Backache 11/26/2007  . HYPOTHYROIDISM, POST-RADIOACTIVE IODINE 07/20/2007  . TOBACCO ABUSE 07/20/2007  . HOT FLASHES 07/20/2007  . BENIGN POSITIONAL VERTIGO, HX OF 07/20/2007  . INFLAMMATORY DISEASE OF BREAST 07/19/2007  . FIBROCYSTIC BREAST DISEASE 04/01/2007   Past Medical History:  Diagnosis Date  . AAA (abdominal aortic aneurysm) ([place])   . Arthritis   . COPD (chronic obstructive pulmonary disease) ([place])   . GERD (gastroesophageal reflux disease)   . Hyperlipidemia   . Hypothyroidism   . Thyroid disease   . Tobacco abuse   . Vertigo     Family History  Problem Relation Age of Onset  . Thyroid disease Other   . Diabetes Other   . Hyperlipidemia Other   . Hypertension Other     Past Surgical History:  Procedure Laterality Date  . CESAREAN SECTION     x 3  . SHOULDER SURGERY  02/2015  . TOTAL KNEE ARTHROPLASTY Right 01/23/2016   Procedure: TOTAL KNEE ARTHROPLASTY;  Surgeon: Garald Balding, MD;  Location: [facility];  Service: Orthopedics;  Laterality: Right;   Social History   Occupational History  . Retired    Social History Main Topics  . Smoking status: Former Smoker    Packs/day: 1.00    Years: 50.00    Types: Cigarettes  . Smokeless tobacco: Never Used   . Alcohol use No  . Drug use: No  . Sexual activity: Not on file

## 2017-01-09 NOTE — Addendum Note (Signed)
Addended by: Lianne Cure A on: 01/09/2017 03:36 PM   Modules accepted: Orders

## 2017-01-09 NOTE — Progress Notes (Signed)
Patient is a 66 year old female who returns for follow-up today with known abdominal aortic aneurysm. The aneurysm currently is 5.8 cm in diameter. On recent review of her images she is not a candidate for stent graft repair. The patient does have a history of significant COPD. She was recently seen by pulmonary and thought to be a reasonable candidate for open repair. Her prior FEV1 is 45% of predicted in February 2017. She denies any chest pain or cardiac-related symptoms. She currently denies any abdominal pain. She has chronic back pain. He is on aspirin and statin.  Patient currently is still smoking but is trying to quit with the assistance of Chantix.  Past Medical History:  Diagnosis Date  . AAA (abdominal aortic aneurysm) ([place])   . Arthritis   . COPD (chronic obstructive pulmonary disease) ([place])   . GERD (gastroesophageal reflux disease)   . Hyperlipidemia   . Hypothyroidism   . Thyroid disease   . Tobacco abuse   . Vertigo     Past Surgical History:  Procedure Laterality Date  . CESAREAN SECTION     x 3  . SHOULDER SURGERY  02/2015  . TOTAL KNEE ARTHROPLASTY Right 01/23/2016   Procedure: TOTAL KNEE ARTHROPLASTY;  Surgeon: Garald Balding, MD;  Location: [facility];  Service: Orthopedics;  Laterality: Right;    Current Outpatient Prescriptions on File Prior to Visit  Medication Sig Dispense Refill  . albuterol (VENTOLIN HFA) 108 (90 Base) MCG/ACT inhaler Inhale 2 puffs into the lungs every 6 (six) hours as needed for wheezing or shortness of breath. 3 Inhaler 3  . aspirin EC 81 MG tablet Take 81 mg by mouth daily.    . diazepam (VALIUM) 5 MG tablet TAKE 1/2 TO 1 TABLET BY MOUTH TWICE A DAY 30 tablet 1  . levothyroxine (SYNTHROID, LEVOTHROID) 88 MCG tablet Take 1 tablet (88 mcg total) by mouth daily. 90 tablet 3  . simvastatin (ZOCOR) 40 MG tablet Take 1 tablet (40 mg total) by mouth at bedtime. 90 tablet 3   No current facility-administered medications on file prior to visit.     No Known Allergies  Review of systems: She denies shortness of breath except with exertion. She denies chest pain.  Physical exam:  Vitals:   01/09/17 0959  BP: (!) 138/92  Pulse: 76  Resp: 18  Temp: (!) 96.9 F (36.1 C)  TempSrc: Oral  SpO2: 98%  Weight: 137 lb 9.6 oz (62.4 kg)  Height: 5\' 5"  (1.651 m)   Neck: No carotid bruits. Chest: Clear to auscultation bilaterally cardiac: Regular rate and rhythm without murmur  Abdomen: Soft nontender nondistended palpable pulsatile epigastric mass  Extremities: 2+ femoral 2+ dorsalis pedis pulses  Data: I reviewed the patient's recent CT scan of the abdomen and pelvis. 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 This is a lowest of the renal arteries. She has severe tortuosity of the neck of the aorta. Anatomy should be suitable for tube graft repair. She does not have an iliac aneurysm component.  Assessment: 5.8 cm infrarenal abdominal aortic aneurysm. Patient is not a candidate for stent graft repair.  Plan: We will send the patient for cardiac risk stratification. We will then schedule her for elective infrarenal abdominal aortic aneurysm repair 02/05/2017. Risks benefits possible complications and procedure details were discussed patient today including not limited to bleeding and transfusion 10% infection 1% myocardial events 5% pulmonary events 5-10% renal failure 1% death 1-2%  Ruta Hinds, MD [facility] Office: [phone] Pager: [phone]

## 2017-01-10 ENCOUNTER — Other Ambulatory Visit: Payer: Self-pay

## 2017-01-13 ENCOUNTER — Other Ambulatory Visit: Payer: Self-pay | Admitting: Family Medicine

## 2017-01-13 ENCOUNTER — Telehealth (HOSPITAL_COMMUNITY): Payer: Self-pay | Admitting: *Deleted

## 2017-01-13 ENCOUNTER — Ambulatory Visit (INDEPENDENT_AMBULATORY_CARE_PROVIDER_SITE_OTHER): Payer: Medicare HMO | Admitting: Orthopaedic Surgery

## 2017-01-13 NOTE — Telephone Encounter (Signed)
Patient given detailed instructions per Myocardial Perfusion Study Information Sheet for the test on 01/14/17 at 1000. Patient notified to arrive 15 minutes early and that it is imperative to arrive on time for appointment to keep from having the test rescheduled.  If you need to cancel or reschedule your appointment, please call the office within 24 hours of your appointment. Failure to do so may result in a cancellation of your appointment, and a $50 no show fee. Patient verbalized understanding.Stacy Garcia, Ranae Palms

## 2017-01-14 ENCOUNTER — Ambulatory Visit (HOSPITAL_COMMUNITY): Payer: Medicare HMO | Attending: Cardiovascular Disease

## 2017-01-14 DIAGNOSIS — I714 Abdominal aortic aneurysm, without rupture: Secondary | ICD-10-CM | POA: Insufficient documentation

## 2017-01-14 DIAGNOSIS — Z01810 Encounter for preprocedural cardiovascular examination: Secondary | ICD-10-CM

## 2017-01-14 DIAGNOSIS — Z01812 Encounter for preprocedural laboratory examination: Secondary | ICD-10-CM

## 2017-01-14 LAB — MYOCARDIAL PERFUSION IMAGING
CHL CUP NUCLEAR SRS: 12
CHL CUP RESTING HR STRESS: 71 {beats}/min
LVDIAVOL: 47 mL (ref 46–106)
LVSYSVOL: 15 mL
NUC STRESS TID: 0.99
Peak HR: 120 {beats}/min
RATE: 0.29
SDS: 3
SSS: 12

## 2017-01-14 MED ORDER — TECHNETIUM TC 99M TETROFOSMIN IV KIT
10.7000 | PACK | Freq: Once | INTRAVENOUS | Status: AC | PRN
  Administered 2017-01-14: 10.7 via INTRAVENOUS
  Filled 2017-01-14: qty 11

## 2017-01-14 MED ORDER — AMINOPHYLLINE 25 MG/ML IV SOLN
75.0000 mg | Freq: Once | INTRAVENOUS | Status: AC
  Administered 2017-01-14: 75 mg via INTRAVENOUS

## 2017-01-14 MED ORDER — REGADENOSON 0.4 MG/5ML IV SOLN
0.4000 mg | Freq: Once | INTRAVENOUS | Status: AC
  Administered 2017-01-14: 0.4 mg via INTRAVENOUS

## 2017-01-14 MED ORDER — TECHNETIUM TC 99M TETROFOSMIN IV KIT
32.7000 | PACK | Freq: Once | INTRAVENOUS | Status: AC | PRN
  Administered 2017-01-14: 32.7 via INTRAVENOUS
  Filled 2017-01-14: qty 33

## 2017-01-14 NOTE — Telephone Encounter (Signed)
Ok to refill 

## 2017-01-14 NOTE — Telephone Encounter (Signed)
Rx called in as directed.   

## 2017-01-17 ENCOUNTER — Ambulatory Visit (INDEPENDENT_AMBULATORY_CARE_PROVIDER_SITE_OTHER): Payer: Commercial Managed Care - HMO | Admitting: Orthopaedic Surgery

## 2017-01-28 ENCOUNTER — Encounter (HOSPITAL_COMMUNITY): Payer: Self-pay

## 2017-01-28 ENCOUNTER — Encounter (HOSPITAL_COMMUNITY)
Admission: RE | Admit: 2017-01-28 | Discharge: 2017-01-28 | Disposition: A | Discharge status: Home / Self Care | Payer: Medicare HMO | Source: Ambulatory Visit | Attending: Vascular Surgery | Admitting: Vascular Surgery

## 2017-01-28 ENCOUNTER — Other Ambulatory Visit: Payer: Self-pay

## 2017-01-28 DIAGNOSIS — Z79899 Other long term (current) drug therapy: Secondary | ICD-10-CM | POA: Insufficient documentation

## 2017-01-28 DIAGNOSIS — Z96651 Presence of right artificial knee joint: Secondary | ICD-10-CM | POA: Insufficient documentation

## 2017-01-28 DIAGNOSIS — J449 Chronic obstructive pulmonary disease, unspecified: Secondary | ICD-10-CM | POA: Diagnosis not present

## 2017-01-28 DIAGNOSIS — K219 Gastro-esophageal reflux disease without esophagitis: Secondary | ICD-10-CM | POA: Diagnosis not present

## 2017-01-28 DIAGNOSIS — Z9889 Other specified postprocedural states: Secondary | ICD-10-CM | POA: Insufficient documentation

## 2017-01-28 DIAGNOSIS — R0600 Dyspnea, unspecified: Secondary | ICD-10-CM

## 2017-01-28 DIAGNOSIS — Z01812 Encounter for preprocedural laboratory examination: Secondary | ICD-10-CM | POA: Insufficient documentation

## 2017-01-28 DIAGNOSIS — I714 Abdominal aortic aneurysm, without rupture: Secondary | ICD-10-CM | POA: Insufficient documentation

## 2017-01-28 DIAGNOSIS — Z7982 Long term (current) use of aspirin: Secondary | ICD-10-CM | POA: Insufficient documentation

## 2017-01-28 HISTORY — DX: Dyspnea, unspecified: R06.00

## 2017-01-28 LAB — COMPREHENSIVE METABOLIC PANEL
ALK PHOS: 92 U/L (ref 38–126)
ALT: 18 U/L (ref 14–54)
ANION GAP: 12 (ref 5–15)
AST: 21 U/L (ref 15–41)
Albumin: 4.1 g/dL (ref 3.5–5.0)
BILIRUBIN TOTAL: 0.7 mg/dL (ref 0.3–1.2)
BUN: 12 mg/dL (ref 6–20)
CALCIUM: 9.6 mg/dL (ref 8.9–10.3)
CO2: 22 mmol/L (ref 22–32)
Chloride: 106 mmol/L (ref 101–111)
Creatinine, Ser: 0.82 mg/dL (ref 0.44–1.00)
GFR calc Af Amer: >60 mL/min (ref >60)
Glucose, Bld: 111 mg/dL — ABNORMAL HIGH (ref 65–99)
Potassium: 4 mmol/L (ref 3.5–5.1)
Sodium: 140 mmol/L (ref 135–145)
TOTAL PROTEIN: 6.9 g/dL (ref 6.5–8.1)

## 2017-01-28 LAB — BLOOD GAS, ARTERIAL
Acid-Base Excess: 0.9 mmol/L (ref 0.0–2.0)
BICARBONATE: 24.8 mmol/L (ref 20.0–28.0)
Drawn by: 421801
FIO2: 21
O2 Saturation: 97.6 %
PCO2 ART: 38.2 mmHg (ref 32.0–48.0)
PH ART: 7.428 (ref 7.350–7.450)
PO2 ART: 106 mmHg (ref 83.0–108.0)
Patient temperature: 98.6

## 2017-01-28 LAB — APTT: aPTT: 29 seconds (ref 24–36)

## 2017-01-28 LAB — URINALYSIS, ROUTINE W REFLEX MICROSCOPIC
BILIRUBIN URINE: NEGATIVE
Glucose, UA: NEGATIVE mg/dL
Hgb urine dipstick: NEGATIVE
Ketones, ur: NEGATIVE mg/dL
Leukocytes, UA: NEGATIVE
NITRITE: NEGATIVE
PH: 5 (ref 5.0–8.0)
Protein, ur: NEGATIVE mg/dL
SPECIFIC GRAVITY, URINE: 1.016 (ref 1.005–1.030)

## 2017-01-28 LAB — CBC
HEMATOCRIT: 46.6 % — AB (ref 36.0–46.0)
Hemoglobin: 15.5 g/dL — ABNORMAL HIGH (ref 12.0–15.0)
MCH: 30.8 pg (ref 26.0–34.0)
MCHC: 33.3 g/dL (ref 30.0–36.0)
MCV: 92.5 fL (ref 78.0–100.0)
Platelets: 224 10*3/uL (ref 150–400)
RBC: 5.04 MIL/uL (ref 3.87–5.11)
RDW: 13.2 % (ref 11.5–15.5)
WBC: 8.1 10*3/uL (ref 4.0–10.5)

## 2017-01-28 LAB — SURGICAL PCR SCREEN
MRSA, PCR: NEGATIVE
Staphylococcus aureus: NEGATIVE

## 2017-01-28 LAB — PROTIME-INR
INR: 1
PROTHROMBIN TIME: 13.2 s (ref 11.4–15.2)

## 2017-01-28 NOTE — Progress Notes (Signed)
PCP is Dr. Sherren Mocha, but does see Netta Corrigan, NP at [facility].  lov 12/2016 Denies any cardiac issues.  Had recent stress test 01/2017

## 2017-01-28 NOTE — Pre-Procedure Instructions (Signed)
    Stacy Garcia  01/28/2017      [id number] [place], [place] - [address] [place] [address] [place] [address] Phone: [phone] Fax: [facility] [place], [place] [address] Phone: [phone] Fax: [phone]    Your procedure is scheduled on Wednesday, May 30th.   Report to [facility] Admitting at 5:30 AM             (posted surgery from 7:30am - 1:00pm)   Call this number if you have problems the morning of surgery:  [phone], other questions call [phone] Mon - Fri from 8am-4:30pm   Remember:   Do not eat food or drink liquids after midnight Tuesday.              4-5 days prior to surgery, STOP taking any Vitamins, Herbal Supplements, Anti-inflammatories.   Take these medicines the morning of surgery with A SIP OF WATER : Diazepam, Levothyroxine, Omeprazole.   Do not wear jewelry, make-up or nail polish.  Do not wear lotions, powders, perfumes, or deoderant.  Do not shave 48 hours prior to surgery.     Do not bring valuables to the hospital.  [facility] is not responsible for any belongings or valuables.  Contacts, dentures or bridgework may not be worn into surgery.  Leave your suitcase in the car.  After surgery it may be brought to your room. For patients admitted to the hospital, discharge time will be determined by your treatment team.  Please read over the following fact sheets that you were given. Pain Booklet, MRSA Information and Surgical Site Infection Prevention

## 2017-02-04 ENCOUNTER — Encounter (HOSPITAL_COMMUNITY): Payer: Self-pay | Admitting: Certified Registered Nurse Anesthetist

## 2017-02-05 ENCOUNTER — Inpatient Hospital Stay (HOSPITAL_COMMUNITY): Payer: Medicare HMO

## 2017-02-05 ENCOUNTER — Inpatient Hospital Stay (HOSPITAL_COMMUNITY): Payer: Medicare HMO | Admitting: Anesthesiology

## 2017-02-05 ENCOUNTER — Inpatient Hospital Stay (HOSPITAL_COMMUNITY)
Admission: RE | Admit: 2017-02-05 | Discharge: 2017-02-10 | DRG: 269 | Disposition: A | Discharge status: Home Health | Payer: Medicare HMO | Source: Ambulatory Visit | Attending: Vascular Surgery | Admitting: Vascular Surgery

## 2017-02-05 ENCOUNTER — Encounter (HOSPITAL_COMMUNITY): Payer: Self-pay | Admitting: *Deleted

## 2017-02-05 ENCOUNTER — Inpatient Hospital Stay (HOSPITAL_COMMUNITY)
Admission: RE | Disposition: A | Discharge status: Home / Self Care | Payer: Self-pay | Source: Ambulatory Visit | Attending: Vascular Surgery

## 2017-02-05 DIAGNOSIS — M1990 Unspecified osteoarthritis, unspecified site: Secondary | ICD-10-CM | POA: Diagnosis not present

## 2017-02-05 DIAGNOSIS — E440 Moderate protein-calorie malnutrition: Secondary | ICD-10-CM | POA: Diagnosis not present

## 2017-02-05 DIAGNOSIS — Z6822 Body mass index (BMI) 22.0-22.9, adult: Secondary | ICD-10-CM

## 2017-02-05 DIAGNOSIS — I714 Abdominal aortic aneurysm, without rupture, unspecified: Secondary | ICD-10-CM | POA: Diagnosis present

## 2017-02-05 DIAGNOSIS — J449 Chronic obstructive pulmonary disease, unspecified: Secondary | ICD-10-CM | POA: Diagnosis not present

## 2017-02-05 DIAGNOSIS — K219 Gastro-esophageal reflux disease without esophagitis: Secondary | ICD-10-CM | POA: Diagnosis present

## 2017-02-05 DIAGNOSIS — Z4659 Encounter for fitting and adjustment of other gastrointestinal appliance and device: Secondary | ICD-10-CM

## 2017-02-05 DIAGNOSIS — E039 Hypothyroidism, unspecified: Secondary | ICD-10-CM | POA: Diagnosis present

## 2017-02-05 DIAGNOSIS — Z4682 Encounter for fitting and adjustment of non-vascular catheter: Secondary | ICD-10-CM | POA: Diagnosis not present

## 2017-02-05 DIAGNOSIS — E871 Hypo-osmolality and hyponatremia: Secondary | ICD-10-CM | POA: Diagnosis not present

## 2017-02-05 DIAGNOSIS — G8929 Other chronic pain: Secondary | ICD-10-CM | POA: Diagnosis present

## 2017-02-05 DIAGNOSIS — Z452 Encounter for adjustment and management of vascular access device: Secondary | ICD-10-CM

## 2017-02-05 DIAGNOSIS — D62 Acute posthemorrhagic anemia: Secondary | ICD-10-CM | POA: Diagnosis not present

## 2017-02-05 DIAGNOSIS — M549 Dorsalgia, unspecified: Secondary | ICD-10-CM | POA: Diagnosis present

## 2017-02-05 DIAGNOSIS — I739 Peripheral vascular disease, unspecified: Secondary | ICD-10-CM | POA: Diagnosis not present

## 2017-02-05 DIAGNOSIS — F17200 Nicotine dependence, unspecified, uncomplicated: Secondary | ICD-10-CM | POA: Diagnosis present

## 2017-02-05 DIAGNOSIS — E785 Hyperlipidemia, unspecified: Secondary | ICD-10-CM | POA: Diagnosis not present

## 2017-02-05 DIAGNOSIS — Z7982 Long term (current) use of aspirin: Secondary | ICD-10-CM

## 2017-02-05 DIAGNOSIS — Z96651 Presence of right artificial knee joint: Secondary | ICD-10-CM | POA: Diagnosis not present

## 2017-02-05 DIAGNOSIS — R079 Chest pain, unspecified: Secondary | ICD-10-CM | POA: Diagnosis not present

## 2017-02-05 DIAGNOSIS — Z9889 Other specified postprocedural states: Secondary | ICD-10-CM

## 2017-02-05 DIAGNOSIS — Z79899 Other long term (current) drug therapy: Secondary | ICD-10-CM | POA: Diagnosis not present

## 2017-02-05 HISTORY — PX: ABDOMINAL AORTIC ANEURYSM REPAIR: SHX42

## 2017-02-05 LAB — POCT I-STAT 7, (LYTES, BLD GAS, ICA,H+H)
ACID-BASE DEFICIT: 3 mmol/L — AB (ref 0.0–2.0)
Acid-base deficit: 4 mmol/L — ABNORMAL HIGH (ref 0.0–2.0)
BICARBONATE: 22.1 mmol/L (ref 20.0–28.0)
Bicarbonate: 22.5 mmol/L (ref 20.0–28.0)
Calcium, Ion: 1.11 mmol/L — ABNORMAL LOW (ref 1.15–1.40)
Calcium, Ion: 1.1 mmol/L — ABNORMAL LOW (ref 1.15–1.40)
HCT: 30 % — ABNORMAL LOW (ref 36.0–46.0)
HCT: 33 % — ABNORMAL LOW (ref 36.0–46.0)
HEMOGLOBIN: 11.2 g/dL — AB (ref 12.0–15.0)
Hemoglobin: 10.2 g/dL — ABNORMAL LOW (ref 12.0–15.0)
O2 SAT: 100 %
O2 Saturation: 100 %
PCO2 ART: 42.9 mmHg (ref 32.0–48.0)
PCO2 ART: 37.7 mmHg (ref 32.0–48.0)
PO2 ART: 225 mmHg — AB (ref 83.0–108.0)
PO2 ART: 215 mmHg — AB (ref 83.0–108.0)
Patient temperature: 35.9
Potassium: 4 mmol/L (ref 3.5–5.1)
Potassium: 4.4 mmol/L (ref 3.5–5.1)
Sodium: 142 mmol/L (ref 135–145)
Sodium: 141 mmol/L (ref 135–145)
TCO2: 23 mmol/L (ref 0–100)
TCO2: 24 mmol/L (ref 0–100)
pH, Arterial: 7.314 — ABNORMAL LOW (ref 7.350–7.450)
pH, Arterial: 7.379 (ref 7.350–7.450)

## 2017-02-05 LAB — BASIC METABOLIC PANEL
ANION GAP: 4 — AB (ref 5–15)
BUN: 12 mg/dL (ref 6–20)
CHLORIDE: 110 mmol/L (ref 101–111)
CO2: 24 mmol/L (ref 22–32)
Calcium: 7.8 mg/dL — ABNORMAL LOW (ref 8.9–10.3)
Creatinine, Ser: 0.83 mg/dL (ref 0.44–1.00)
GFR calc Af Amer: >60 mL/min (ref >60)
Glucose, Bld: 137 mg/dL — ABNORMAL HIGH (ref 65–99)
POTASSIUM: 4.5 mmol/L (ref 3.5–5.1)
SODIUM: 138 mmol/L (ref 135–145)

## 2017-02-05 LAB — CBC
HCT: 37.3 % (ref 36.0–46.0)
HEMOGLOBIN: 12.2 g/dL (ref 12.0–15.0)
MCH: 30.9 pg (ref 26.0–34.0)
MCHC: 32.7 g/dL (ref 30.0–36.0)
MCV: 94.4 fL (ref 78.0–100.0)
PLATELETS: 174 10*3/uL (ref 150–400)
RBC: 3.95 MIL/uL (ref 3.87–5.11)
RDW: 13.7 % (ref 11.5–15.5)
WBC: 17.7 10*3/uL — AB (ref 4.0–10.5)

## 2017-02-05 LAB — APTT: APTT: 31 s (ref 24–36)

## 2017-02-05 LAB — PREPARE RBC (CROSSMATCH)

## 2017-02-05 LAB — PROTIME-INR
INR: 1.28
PROTHROMBIN TIME: 16.1 s — AB (ref 11.4–15.2)

## 2017-02-05 LAB — MAGNESIUM: MAGNESIUM: 1.7 mg/dL (ref 1.7–2.4)

## 2017-02-05 SURGERY — ANEURYSM ABDOMINAL AORTIC REPAIR
Anesthesia: General | Site: Abdomen

## 2017-02-05 MED ORDER — ALBUMIN HUMAN 5 % IV SOLN
INTRAVENOUS | Status: DC | PRN
  Administered 2017-02-05 (×2): via INTRAVENOUS

## 2017-02-05 MED ORDER — ALUM & MAG HYDROXIDE-SIMETH 200-200-20 MG/5ML PO SUSP: 15.0000 mL | ORAL | Status: DC | PRN

## 2017-02-05 MED ORDER — MORPHINE SULFATE (PF) 4 MG/ML IV SOLN
2.0000 mg | INTRAVENOUS | Status: DC | PRN
  Administered 2017-02-05: 2 mg via INTRAVENOUS
  Administered 2017-02-05 (×2): 4 mg via INTRAVENOUS
  Filled 2017-02-05 (×3): qty 1

## 2017-02-05 MED ORDER — MIDAZOLAM HCL 2 MG/2ML IJ SOLN
INTRAMUSCULAR | Status: AC
  Filled 2017-02-05: qty 2

## 2017-02-05 MED ORDER — ONDANSETRON HCL 4 MG/2ML IJ SOLN: 4.0000 mg | Freq: Four times a day (QID) | INTRAMUSCULAR | Status: DC | PRN

## 2017-02-05 MED ORDER — PROPOFOL 10 MG/ML IV BOLUS
INTRAVENOUS | Status: AC
  Filled 2017-02-05: qty 40

## 2017-02-05 MED ORDER — PHENYLEPHRINE 40 MCG/ML (10ML) SYRINGE FOR IV PUSH (FOR BLOOD PRESSURE SUPPORT)
PREFILLED_SYRINGE | INTRAVENOUS | Status: AC
  Filled 2017-02-05: qty 10

## 2017-02-05 MED ORDER — EPHEDRINE 5 MG/ML INJ
INTRAVENOUS | Status: AC
  Filled 2017-02-05: qty 10

## 2017-02-05 MED ORDER — MIDAZOLAM HCL 5 MG/5ML IJ SOLN
INTRAMUSCULAR | Status: DC | PRN
  Administered 2017-02-05 (×2): 1 mg via INTRAVENOUS

## 2017-02-05 MED ORDER — CHLORHEXIDINE GLUCONATE CLOTH 2 % EX PADS: 6.0000 | MEDICATED_PAD | Freq: Once | CUTANEOUS | Status: DC

## 2017-02-05 MED ORDER — FENTANYL CITRATE (PF) 250 MCG/5ML IJ SOLN
INTRAMUSCULAR | Status: AC
  Filled 2017-02-05: qty 10

## 2017-02-05 MED ORDER — SODIUM CHLORIDE 0.9 % IV SOLN: 500.0000 mL | Freq: Once | INTRAVENOUS | Status: DC | PRN

## 2017-02-05 MED ORDER — ONDANSETRON HCL 4 MG/2ML IJ SOLN
4.0000 mg | Freq: Four times a day (QID) | INTRAMUSCULAR | Status: DC | PRN
  Administered 2017-02-05: 4 mg via INTRAVENOUS
  Filled 2017-02-05: qty 2

## 2017-02-05 MED ORDER — PROPOFOL 10 MG/ML IV BOLUS
INTRAVENOUS | Status: DC | PRN
  Administered 2017-02-05: 200 mg via INTRAVENOUS

## 2017-02-05 MED ORDER — ROCURONIUM BROMIDE 10 MG/ML (PF) SYRINGE
PREFILLED_SYRINGE | INTRAVENOUS | Status: AC
  Filled 2017-02-05: qty 5

## 2017-02-05 MED ORDER — HEPARIN SODIUM (PORCINE) 1000 UNIT/ML IJ SOLN
INTRAMUSCULAR | Status: DC | PRN
  Administered 2017-02-05: 6000 [IU] via INTRAVENOUS

## 2017-02-05 MED ORDER — KCL IN DEXTROSE-NACL 20-5-0.45 MEQ/L-%-% IV SOLN
INTRAVENOUS | Status: DC
Start: 2017-02-05 — End: 2017-02-07
  Administered 2017-02-05 – 2017-02-06 (×4): via INTRAVENOUS
  Filled 2017-02-05 (×5): qty 1000

## 2017-02-05 MED ORDER — DEXTROSE 5 % IV SOLN
1.5000 g | INTRAVENOUS | Status: AC
  Administered 2017-02-05 (×2): 1.5 g via INTRAVENOUS

## 2017-02-05 MED ORDER — ESMOLOL HCL 100 MG/10ML IV SOLN
INTRAVENOUS | Status: DC | PRN
  Administered 2017-02-05: 30 mg via INTRAVENOUS

## 2017-02-05 MED ORDER — LACTATED RINGERS IV SOLN
INTRAVENOUS | Status: DC | PRN
  Administered 2017-02-05 (×4): via INTRAVENOUS

## 2017-02-05 MED ORDER — FENTANYL CITRATE (PF) 100 MCG/2ML IJ SOLN
INTRAMUSCULAR | Status: DC | PRN
  Administered 2017-02-05: 50 ug via INTRAVENOUS
  Administered 2017-02-05: 250 ug via INTRAVENOUS
  Administered 2017-02-05: 100 ug via INTRAVENOUS
  Administered 2017-02-05 (×3): 50 ug via INTRAVENOUS
  Administered 2017-02-05: 100 ug via INTRAVENOUS

## 2017-02-05 MED ORDER — SODIUM CHLORIDE 0.9 % IV SOLN: 10.0000 mL/h | Freq: Once | INTRAVENOUS | Status: DC

## 2017-02-05 MED ORDER — DOCUSATE SODIUM 100 MG PO CAPS
100.0000 mg | ORAL_CAPSULE | Freq: Every day | ORAL | Status: DC
  Administered 2017-02-08 – 2017-02-10 (×3): 100 mg via ORAL
  Filled 2017-02-05 (×3): qty 1

## 2017-02-05 MED ORDER — PHENYLEPHRINE HCL 10 MG/ML IJ SOLN
INTRAVENOUS | Status: DC | PRN
  Administered 2017-02-05: 25 ug/min via INTRAVENOUS

## 2017-02-05 MED ORDER — NALOXONE HCL 0.4 MG/ML IJ SOLN: 0.4000 mg | INTRAMUSCULAR | Status: DC | PRN

## 2017-02-05 MED ORDER — DEXTROSE 5 % IV SOLN
INTRAVENOUS | Status: AC
  Filled 2017-02-05: qty 1.5

## 2017-02-05 MED ORDER — NITROGLYCERIN IN D5W 200-5 MCG/ML-% IV SOLN
INTRAVENOUS | Status: DC | PRN
  Administered 2017-02-05 (×2): 10 ug/min via INTRAVENOUS

## 2017-02-05 MED ORDER — DEXTROSE 5 % IV SOLN
1.5000 g | Freq: Two times a day (BID) | INTRAVENOUS | Status: AC
  Administered 2017-02-05 – 2017-02-06 (×2): 1.5 g via INTRAVENOUS
  Filled 2017-02-05 (×2): qty 1.5

## 2017-02-05 MED ORDER — METOPROLOL TARTRATE 5 MG/5ML IV SOLN
INTRAVENOUS | Status: AC
  Filled 2017-02-05: qty 5

## 2017-02-05 MED ORDER — ACETAMINOPHEN 325 MG PO TABS
325.0000 mg | ORAL_TABLET | ORAL | Status: DC | PRN
  Filled 2017-02-05: qty 2

## 2017-02-05 MED ORDER — 0.9 % SODIUM CHLORIDE (POUR BTL) OPTIME
TOPICAL | Status: DC | PRN
  Administered 2017-02-05: 3000 mL

## 2017-02-05 MED ORDER — HEPARIN SODIUM (PORCINE) 1000 UNIT/ML IJ SOLN
INTRAMUSCULAR | Status: AC
  Filled 2017-02-05: qty 1

## 2017-02-05 MED ORDER — PANTOPRAZOLE SODIUM 40 MG PO TBEC: 40.0000 mg | DELAYED_RELEASE_TABLET | Freq: Every day | ORAL | Status: DC

## 2017-02-05 MED ORDER — METOPROLOL TARTRATE 5 MG/5ML IV SOLN
INTRAVENOUS | Status: DC | PRN
  Administered 2017-02-05: 1 mg via INTRAVENOUS
  Administered 2017-02-05: 2 mg via INTRAVENOUS

## 2017-02-05 MED ORDER — ORAL CARE MOUTH RINSE
15.0000 mL | Freq: Two times a day (BID) | OROMUCOSAL | Status: DC
  Administered 2017-02-06 (×2): 15 mL via OROMUCOSAL

## 2017-02-05 MED ORDER — METOPROLOL TARTRATE 5 MG/5ML IV SOLN
2.0000 mg | INTRAVENOUS | Status: DC | PRN
  Administered 2017-02-07: 5 mg via INTRAVENOUS
  Filled 2017-02-05: qty 5

## 2017-02-05 MED ORDER — ONDANSETRON HCL 4 MG/2ML IJ SOLN
INTRAMUSCULAR | Status: AC
  Filled 2017-02-05: qty 2

## 2017-02-05 MED ORDER — LIDOCAINE 2% (20 MG/ML) 5 ML SYRINGE
INTRAMUSCULAR | Status: AC
Start: 2017-02-05 — End: 2017-02-05
  Filled 2017-02-05: qty 5

## 2017-02-05 MED ORDER — ENOXAPARIN SODIUM 40 MG/0.4ML ~~LOC~~ SOLN: 40.0000 mg | SUBCUTANEOUS | Status: DC

## 2017-02-05 MED ORDER — HYDRALAZINE HCL 20 MG/ML IJ SOLN
5.0000 mg | INTRAMUSCULAR | Status: DC | PRN
  Administered 2017-02-05: 5 mg via INTRAVENOUS
  Filled 2017-02-05: qty 1

## 2017-02-05 MED ORDER — ROCURONIUM BROMIDE 100 MG/10ML IV SOLN
INTRAVENOUS | Status: DC | PRN
  Administered 2017-02-05 (×2): 50 mg via INTRAVENOUS

## 2017-02-05 MED ORDER — OXYCODONE HCL 5 MG/5ML PO SOLN: 5.0000 mg | Freq: Once | ORAL | Status: DC | PRN

## 2017-02-05 MED ORDER — OXYCODONE HCL 5 MG PO TABS: 5.0000 mg | ORAL_TABLET | Freq: Once | ORAL | Status: DC | PRN

## 2017-02-05 MED ORDER — SUGAMMADEX SODIUM 200 MG/2ML IV SOLN
INTRAVENOUS | Status: DC | PRN
  Administered 2017-02-05: 125 mg via INTRAVENOUS

## 2017-02-05 MED ORDER — LABETALOL HCL 5 MG/ML IV SOLN
10.0000 mg | INTRAVENOUS | Status: DC | PRN
  Administered 2017-02-05: 10 mg via INTRAVENOUS
  Filled 2017-02-05: qty 4

## 2017-02-05 MED ORDER — FENTANYL CITRATE (PF) 250 MCG/5ML IJ SOLN
INTRAMUSCULAR | Status: AC
  Filled 2017-02-05: qty 5

## 2017-02-05 MED ORDER — SODIUM CHLORIDE 0.9% FLUSH: 9.0000 mL | INTRAVENOUS | Status: DC | PRN

## 2017-02-05 MED ORDER — DIPHENHYDRAMINE HCL 12.5 MG/5ML PO ELIX: 12.5000 mg | ORAL_SOLUTION | Freq: Four times a day (QID) | ORAL | Status: DC | PRN

## 2017-02-05 MED ORDER — DIPHENHYDRAMINE HCL 50 MG/ML IJ SOLN: 12.5000 mg | Freq: Four times a day (QID) | INTRAMUSCULAR | Status: DC | PRN

## 2017-02-05 MED ORDER — PROTAMINE SULFATE 10 MG/ML IV SOLN
INTRAVENOUS | Status: AC
  Filled 2017-02-05: qty 10

## 2017-02-05 MED ORDER — SODIUM CHLORIDE 0.9 % IV SOLN: INTRAVENOUS | Status: DC

## 2017-02-05 MED ORDER — MAGNESIUM SULFATE 2 GM/50ML IV SOLN
2.0000 g | Freq: Every day | INTRAVENOUS | Status: DC | PRN
  Filled 2017-02-05: qty 50

## 2017-02-05 MED ORDER — ONDANSETRON HCL 4 MG/2ML IJ SOLN
4.0000 mg | Freq: Four times a day (QID) | INTRAMUSCULAR | Status: DC | PRN
  Administered 2017-02-06: 4 mg via INTRAVENOUS
  Filled 2017-02-05: qty 2

## 2017-02-05 MED ORDER — SODIUM CHLORIDE 0.9 % IV SOLN
INTRAVENOUS | Status: DC | PRN
  Administered 2017-02-05: 11:00:00 via INTRAVENOUS

## 2017-02-05 MED ORDER — ACETAMINOPHEN 325 MG RE SUPP: 325.0000 mg | RECTAL | Status: DC | PRN

## 2017-02-05 MED ORDER — HYDROMORPHONE HCL 1 MG/ML IJ SOLN
INTRAMUSCULAR | Status: AC
  Filled 2017-02-05: qty 0.5

## 2017-02-05 MED ORDER — CHLORHEXIDINE GLUCONATE 0.12 % MT SOLN
15.0000 mL | Freq: Two times a day (BID) | OROMUCOSAL | Status: DC
  Administered 2017-02-05 – 2017-02-08 (×6): 15 mL via OROMUCOSAL
  Filled 2017-02-05 (×3): qty 15

## 2017-02-05 MED ORDER — HYDROMORPHONE HCL 1 MG/ML IJ SOLN
0.2500 mg | INTRAMUSCULAR | Status: DC | PRN
Start: 2017-02-05 — End: 2017-02-05
  Administered 2017-02-05 (×2): 0.5 mg via INTRAVENOUS

## 2017-02-05 MED ORDER — OXYCODONE-ACETAMINOPHEN 5-325 MG PO TABS: 1.0000 | ORAL_TABLET | ORAL | Status: DC | PRN

## 2017-02-05 MED ORDER — SODIUM CHLORIDE 0.9 % IV SOLN
INTRAVENOUS | Status: DC | PRN
  Administered 2017-02-05: 500 mL

## 2017-02-05 MED ORDER — LIDOCAINE HCL (CARDIAC) 20 MG/ML IV SOLN: INTRAVENOUS | Status: DC | PRN

## 2017-02-05 MED ORDER — ONDANSETRON HCL 4 MG/2ML IJ SOLN
INTRAMUSCULAR | Status: DC | PRN
  Administered 2017-02-05: 4 mg via INTRAVENOUS

## 2017-02-05 MED ORDER — HYDROMORPHONE HCL 1 MG/ML IJ SOLN
INTRAMUSCULAR | Status: AC
Start: 2017-02-05 — End: 2017-02-06
  Filled 2017-02-05: qty 0.5

## 2017-02-05 MED ORDER — GUAIFENESIN-DM 100-10 MG/5ML PO SYRP: 15.0000 mL | ORAL_SOLUTION | ORAL | Status: DC | PRN

## 2017-02-05 MED ORDER — HYDROMORPHONE 1 MG/ML IV SOLN
INTRAVENOUS | Status: DC
  Administered 2017-02-05: 17:00:00 via INTRAVENOUS
  Administered 2017-02-05: 0.4 mg via INTRAVENOUS
  Administered 2017-02-06: 0.8 mg via INTRAVENOUS
  Administered 2017-02-06: 3 mg via INTRAVENOUS
  Administered 2017-02-06: 3.4 mg via INTRAVENOUS
  Administered 2017-02-06: 1 mg via INTRAVENOUS
  Administered 2017-02-06: 1.8 mg via INTRAVENOUS
  Administered 2017-02-07: 3 mg via INTRAVENOUS
  Administered 2017-02-07: 1.6 mg via INTRAVENOUS
  Administered 2017-02-08 (×2): 0.8 mg via INTRAVENOUS
  Administered 2017-02-08: 0.6 mg via INTRAVENOUS
  Filled 2017-02-05: qty 25

## 2017-02-05 MED ORDER — PANTOPRAZOLE SODIUM 40 MG IV SOLR
40.0000 mg | Freq: Every day | INTRAVENOUS | Status: DC
  Administered 2017-02-05 – 2017-02-08 (×4): 40 mg via INTRAVENOUS
  Filled 2017-02-05 (×4): qty 40

## 2017-02-05 MED ORDER — POTASSIUM CHLORIDE CRYS ER 20 MEQ PO TBCR: 20.0000 meq | EXTENDED_RELEASE_TABLET | Freq: Every day | ORAL | Status: DC | PRN

## 2017-02-05 MED ORDER — HYDROMORPHONE 1 MG/ML IV SOLN: INTRAVENOUS | Status: DC

## 2017-02-05 MED ORDER — SENNOSIDES-DOCUSATE SODIUM 8.6-50 MG PO TABS
1.0000 | ORAL_TABLET | Freq: Every evening | ORAL | Status: DC | PRN
  Filled 2017-02-05: qty 1

## 2017-02-05 MED ORDER — CEFUROXIME SODIUM 750 MG IJ SOLR
INTRAMUSCULAR | Status: AC
  Filled 2017-02-05: qty 1500

## 2017-02-05 MED ORDER — PHENOL 1.4 % MT LIQD: 1.0000 | OROMUCOSAL | Status: DC | PRN

## 2017-02-05 MED ORDER — SUCCINYLCHOLINE CHLORIDE 200 MG/10ML IV SOSY
PREFILLED_SYRINGE | INTRAVENOUS | Status: AC
  Filled 2017-02-05: qty 10

## 2017-02-05 MED ORDER — PROTAMINE SULFATE 10 MG/ML IV SOLN
INTRAVENOUS | Status: DC | PRN
  Administered 2017-02-05: 60 mg via INTRAVENOUS

## 2017-02-05 SURGICAL SUPPLY — 45 items
ATTRACTOMAT 16X20 MAGNETIC DRP (DRAPES) ×2 IMPLANT
CANISTER SUCT 3000ML PPV (MISCELLANEOUS) ×2 IMPLANT
CANNULA VESSEL 3MM 2 BLNT TIP (CANNULA) ×4 IMPLANT
CLIP TI MEDIUM 24 (CLIP) ×2 IMPLANT
CLIP TI WIDE RED SMALL 24 (CLIP) ×2 IMPLANT
DERMABOND ADVANCED (GAUZE/BANDAGES/DRESSINGS) ×1
DERMABOND ADVANCED .7 DNX12 (GAUZE/BANDAGES/DRESSINGS) ×1 IMPLANT
ELECT BLADE 6.5 EXT (BLADE) IMPLANT
ELECT REM PT RETURN 9FT ADLT (ELECTROSURGICAL) ×2
ELECTRODE REM PT RTRN 9FT ADLT (ELECTROSURGICAL) ×1 IMPLANT
FELT TEFLON 4 X1 (MISCELLANEOUS) ×2 IMPLANT
GLOVE BIO SURGEON STRL SZ 6.5 (GLOVE) ×4 IMPLANT
GLOVE BIO SURGEON STRL SZ7.5 (GLOVE) ×2 IMPLANT
GLOVE BIOGEL PI IND STRL 6.5 (GLOVE) ×5 IMPLANT
GLOVE BIOGEL PI INDICATOR 6.5 (GLOVE) ×5
GLOVE ECLIPSE 7.0 STRL STRAW (GLOVE) ×2 IMPLANT
GOWN STRL REUS W/ TWL LRG LVL3 (GOWN DISPOSABLE) ×3 IMPLANT
GOWN STRL REUS W/TWL LRG LVL3 (GOWN DISPOSABLE) ×3
GRAFT HEMASHIELD 16MM (Vascular Products) ×2 IMPLANT
INSERT FOGARTY 61MM (MISCELLANEOUS) ×4 IMPLANT
INSERT FOGARTY SM (MISCELLANEOUS) IMPLANT
KIT BASIN OR (CUSTOM PROCEDURE TRAY) ×2 IMPLANT
KIT ROOM TURNOVER OR (KITS) ×2 IMPLANT
LOOP VESSEL MINI RED (MISCELLANEOUS) IMPLANT
NS IRRIG 1000ML POUR BTL (IV SOLUTION) ×4 IMPLANT
PACK AORTA (CUSTOM PROCEDURE TRAY) ×2 IMPLANT
PAD ARMBOARD 7.5X6 YLW CONV (MISCELLANEOUS) ×4 IMPLANT
RETAINER VISCERA MED (MISCELLANEOUS) ×2 IMPLANT
SPONGE LAP 18X18 X RAY DECT (DISPOSABLE) IMPLANT
SPONGE SURGIFOAM ABS GEL 100 (HEMOSTASIS) IMPLANT
STAPLER VISISTAT 35W (STAPLE) ×4 IMPLANT
SUT PDS AB 1 TP1 54 (SUTURE) ×4 IMPLANT
SUT PROLENE 3 0 SH DA (SUTURE) IMPLANT
SUT PROLENE 3 0 SH1 36 (SUTURE) ×14 IMPLANT
SUT PROLENE 5 0 C 1 36 (SUTURE) ×4 IMPLANT
SUT PROLENE 5 0 CC 1 (SUTURE) ×4 IMPLANT
SUT PROLENE 6 0 C 1 30 (SUTURE) ×2 IMPLANT
SUT SILK 2 0SH CR/8 30 (SUTURE) ×2 IMPLANT
SUT VIC AB 2-0 SH 27 (SUTURE) ×4
SUT VIC AB 2-0 SH 27XBRD (SUTURE) ×4 IMPLANT
SUT VIC AB 3-0 SH 27 (SUTURE) ×5
SUT VIC AB 3-0 SH 27X BRD (SUTURE) ×5 IMPLANT
SUT VIC AB 4-0 PS2 27 (SUTURE) ×4 IMPLANT
TRAY FOLEY W/METER SILVER 16FR (SET/KITS/TRAYS/PACK) ×2 IMPLANT
WATER STERILE IRR 1000ML POUR (IV SOLUTION) ×4 IMPLANT

## 2017-02-05 NOTE — Anesthesia Preprocedure Evaluation (Signed)
Anesthesia Evaluation  Patient identified by MRN, date of birth, ID band Patient awake    Reviewed: Allergy & Precautions, H&P , NPO status , Patient's Chart, lab work & pertinent test results  Airway Mallampati: II   Neck ROM: full    Dental   Pulmonary shortness of breath, COPD, former smoker,    breath sounds clear to auscultation       Cardiovascular + Peripheral Vascular Disease   Rhythm:regular Rate:Normal  AAA   Neuro/Psych  Headaches,    GI/Hepatic PUD, GERD  ,  Endo/Other  Hypothyroidism   Renal/GU      Musculoskeletal  (+) Arthritis ,   Abdominal   Peds  Hematology   Anesthesia Other Findings   Reproductive/Obstetrics                             Anesthesia Physical Anesthesia Plan  ASA: III  Anesthesia Plan: General   Post-op Pain Management:    Induction: Intravenous  Airway Management Planned: Oral ETT  Additional Equipment: Arterial line, CVP and Ultrasound Guidance Line Placement  Intra-op Plan:   Post-operative Plan: Possible Post-op intubation/ventilation  Informed Consent: I have reviewed the patients History and Physical, chart, labs and discussed the procedure including the risks, benefits and alternatives for the proposed anesthesia with the patient or authorized representative who has indicated his/her understanding and acceptance.     Plan Discussed with: CRNA, Anesthesiologist and Surgeon  Anesthesia Plan Comments:         Anesthesia Quick Evaluation

## 2017-02-05 NOTE — Anesthesia Procedure Notes (Signed)
Central Venous Catheter Insertion Performed by: Marcie Bal, Allee Busk, anesthesiologist Start/End5/30/2018 7:05 AM, 02/05/2017 7:16 AM Patient location: Pre-op. Preanesthetic checklist: patient identified, IV checked, site marked, risks and benefits discussed, surgical consent, monitors and equipment checked, pre-op evaluation, timeout performed and anesthesia consent Lidocaine 1% used for infiltration and patient sedated Hand hygiene performed  and maximum sterile barriers used  Catheter size: 8.5 Fr Central line was placed.Sheath introducer Procedure performed using ultrasound guided technique. Ultrasound Notes:anatomy identified, needle tip was noted to be adjacent to the nerve/plexus identified, no ultrasound evidence of intravascular and/or intraneural injection and image(s) printed for medical record Attempts: 1 Following insertion, line sutured, dressing applied and Biopatch. Post procedure assessment: blood return through all ports, free fluid flow and no air  Patient tolerated the procedure well with no immediate complications.

## 2017-02-05 NOTE — Interval H&P Note (Signed)
History and Physical Interval Note:  02/05/2017 7:57 AM  Stacy Garcia  has presented today for surgery, with the diagnosis of Abdominal aortic aneurysm I71.4  The various methods of treatment have been discussed with the patient and family. After consideration of risks, benefits and other options for treatment, the patient has consented to  Procedure(s): ANEURYSM ABDOMINAL AORTIC REPAIR (N/A) as a surgical intervention .  The patient's history has been reviewed, patient examined, no change in status, stable for surgery.  I have reviewed the patient's chart and labs.  Questions were answered to the patient's satisfaction.     Ruta Hinds

## 2017-02-05 NOTE — Op Note (Addendum)
Procedure: Repair of abdominal aortic aneurysm  Preoperative diagnosis: Abdominal aortic aneurysm 5.8 cm  Postoperative diagnosis: Same  Anesthesia: Gen.  Assistant: Gerri Lins PA-C  Operative findings: 16 mm Dacron graft from just below take off of renal arteries to the aortic bifurcation  Operative details: After obtaining informed consent, the patient was taken to the operating room. The patient was placed in supine position on the operating room table. After induction of general anesthesia and endotracheal intubation, a Foley catheter and nasogastric tube was placed. Next the patient was prepped and draped in usual sterile fashion from the nipples to the knees. A midline laparotomy incision was made extending from the xiphoid down to the pubis. Incision was carried into the subcutaneous tissues and through the fascia and peritoneum.  The transverse colon was mobilized superiorly the small bowel and duodenum were reflected to the right. The retroperitoneum was entered. The Omni retractor was used to assist in retraction. Dissection was carried up to the level of the left renal vein. There was an accessory left renal artery coming off about 1-2 cm below the main left renal.  The was dissected free circumferentially and a vessel loop placed around this.  A suitable area for clamping healthy artery was just below the main renals bilaterally.  The neck was fairly tortuous which required a fair amount of upward traction on the left renal vein and downward on the aneurysm to achieve aqdequate exposture.  The aortic neck was dissected free circumferentially and an umbilical tape placed around this.  The left renal and right renal arteries were identified on their inferior portion and protected.  Dissection was then carried down to the aortic bifurcation. The left and right common iliac arteries were dissected free circumferentially and an umbilical tape placed around these. There was posterior plaque but the  anterior 2/3 of the artery was soft on palpation.  The patient was given 6000 units of intravenous heparin. The left and right common iliac arteries were controlled with Henley clamps in an AP fashion to avoid cracking the plaque. An aortic DeBakey clamp was used to control the aorta just below the level of the renal arteries. The accessory left renal artery was controlled with a vessel loop.   The aneurysm was entered with cautery. The IMA was injured while opening the aneurysm.  There was pulsatile bleeding from the IMA and this was ligated.  There was some laminated thrombus material and as much as possible was evacuated and thoroughly irrigated with normal saline solution. Two lumbar arteries was ligated with a figure-of-eight suture. The proximal neck of aorta was fashioned and an 16 mm Dacron graft brought onto the operative field and sewn end-to-end to this using a running 3-0 Prolene suture with a circumferential felt also mattressing the aortic wall to draw the graft into the native aorta.  At completion of the anastomosis this was tested and was hemostatic. The aortic clamp was then moved down below the level anastomosis. Flow was restored to the accessory left renal.  The graft was then cut to length to the distal anastomosis. The distal aorta was fashioned and prepared for anastomosis. Graft was then sent end-to-end to the aortic bifurcation using a running 3-0 Prolene suture. Just prior to completion of the anastomosis everything was thoroughly forebled backbled and thoroughly flushed from the iliacs and aorta. The anastomosis was secured clamps released there is initial pressure drop on unclamping of the right common iliac artery and this quickly recovered. On unclamping the left  common iliac artery was also brief pressure drop and this quickly recovered. Thre repair stitches were placed on the postero left lateral wall of the distal anastomosis. Heparin was reversed with 60 mg of protamine. Hemostasis  was obtained. The aortic wall was reapproximated using a running 3-0 Vicryl suture. The retroperitoneum was closed so there was adequate coverage of the suture lines using again a running 3-0 Vicryl suture. The viscera return to their normal position the Omni retractor was removed. The small bowel transverse and sigmoid colon were all viable. Next, the fascia was reapproximated with a running #1 PDS suture. The skin was then closed with a 4 0 vicryl subcuticular stitch . Instrument sponge and needle counts were correct at the end of the case. The patient was taken to the PACU in stable condition.   Ruta Hinds, MD  [facility]  Office: [phone]  Pager: [phone]

## 2017-02-05 NOTE — H&P (View-Only) (Signed)
Patient is a 66 year old female who returns for follow-up today with known abdominal aortic aneurysm. The aneurysm currently is 5.8 cm in diameter. On recent review of her images she is not a candidate for stent graft repair. The patient does have a history of significant COPD. She was recently seen by pulmonary and thought to be a reasonable candidate for open repair. Her prior FEV1 is 45% of predicted in February 2017. She denies any chest pain or cardiac-related symptoms. She currently denies any abdominal pain. She has chronic back pain. He is on aspirin and statin.  Patient currently is still smoking but is trying to quit with the assistance of Chantix.  Past Medical History:  Diagnosis Date  . AAA (abdominal aortic aneurysm) ([place])   . Arthritis   . COPD (chronic obstructive pulmonary disease) ([place])   . GERD (gastroesophageal reflux disease)   . Hyperlipidemia   . Hypothyroidism   . Thyroid disease   . Tobacco abuse   . Vertigo     Past Surgical History:  Procedure Laterality Date  . CESAREAN SECTION     x 3  . SHOULDER SURGERY  02/2015  . TOTAL KNEE ARTHROPLASTY Right 01/23/2016   Procedure: TOTAL KNEE ARTHROPLASTY;  Surgeon: Garald Balding, MD;  Location: [facility];  Service: Orthopedics;  Laterality: Right;    Current Outpatient Prescriptions on File Prior to Visit  Medication Sig Dispense Refill  . albuterol (VENTOLIN HFA) 108 (90 Base) MCG/ACT inhaler Inhale 2 puffs into the lungs every 6 (six) hours as needed for wheezing or shortness of breath. 3 Inhaler 3  . aspirin EC 81 MG tablet Take 81 mg by mouth daily.    . diazepam (VALIUM) 5 MG tablet TAKE 1/2 TO 1 TABLET BY MOUTH TWICE A DAY 30 tablet 1  . levothyroxine (SYNTHROID, LEVOTHROID) 88 MCG tablet Take 1 tablet (88 mcg total) by mouth daily. 90 tablet 3  . simvastatin (ZOCOR) 40 MG tablet Take 1 tablet (40 mg total) by mouth at bedtime. 90 tablet 3   No current facility-administered medications on file prior to visit.     No Known Allergies  Review of systems: She denies shortness of breath except with exertion. She denies chest pain.  Physical exam:  Vitals:   01/09/17 0959  BP: (!) 138/92  Pulse: 76  Resp: 18  Temp: (!) 96.9 F (36.1 C)  TempSrc: Oral  SpO2: 98%  Weight: 137 lb 9.6 oz (62.4 kg)  Height: 5\' 5"  (1.651 m)   Neck: No carotid bruits. Chest: Clear to auscultation bilaterally cardiac: Regular rate and rhythm without murmur  Abdomen: Soft nontender nondistended palpable pulsatile epigastric mass  Extremities: 2+ femoral 2+ dorsalis pedis pulses  Data: I reviewed the patient's recent CT scan of the abdomen and pelvis. 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 This is a lowest of the renal arteries. She has severe tortuosity of the neck of the aorta. Anatomy should be suitable for tube graft repair. She does not have an iliac aneurysm component.  Assessment: 5.8 cm infrarenal abdominal aortic aneurysm. Patient is not a candidate for stent graft repair.  Plan: We will send the patient for cardiac risk stratification. We will then schedule her for elective infrarenal abdominal aortic aneurysm repair 02/05/2017. Risks benefits possible complications and procedure details were discussed patient today including not limited to bleeding and transfusion 10% infection 1% myocardial events 5% pulmonary events 5-10% renal failure 1% death 1-2%  Ruta Hinds, MD [facility] [place] Office: [phone] Pager: [phone]

## 2017-02-05 NOTE — Significant Event (Signed)
Patient continues to complain of abdominal surgical pain that is not relief with current pain medications; moaning loudly in pain.  RN notify vascular PA and spoke with Gerri Lins and receive orders for reduced dose dilaudid PCA, change oral Protonix to IV, and advance NG 8-10cm as recommended from KUB. RN to enter these orders in EPIC as PA is unable to at the time.   Stacy Garcia

## 2017-02-05 NOTE — Progress Notes (Signed)
    Abdominal incision clean dry and non distended. Palpable DP B 2+  S/P Open repair AAA Stable disposition  COLLINS, EMMA MAUREEN PA-C

## 2017-02-05 NOTE — Transfer of Care (Signed)
Immediate Anesthesia Transfer of Care Note  Patient: Stacy Garcia  Procedure(s) Performed: Procedure(s): ANEURYSM ABDOMINAL AORTIC REPAIR (N/A)  Patient Location: PACU  Anesthesia Type:General  Level of Consciousness: awake, alert , oriented and patient cooperative  Airway & Oxygen Therapy: Patient Spontanous Breathing and Patient connected to nasal cannula oxygen  Post-op Assessment: Report given to RN, Post -op Vital signs reviewed and stable and Patient moving all extremities X 4  Post vital signs: Reviewed and stable  Last Vitals:  BP 122/62 HR 72 RR 14 SpO2 99% on 2L [place] Resting comfortably, maintains good airway, denies pain.    Complications: No apparent anesthesia complications

## 2017-02-05 NOTE — Anesthesia Procedure Notes (Signed)
Procedure Name: Intubation Date/Time: 02/05/2017 8:18 AM Performed by: Willeen Cass P Pre-anesthesia Checklist: Patient identified, Emergency Drugs available, Suction available and Patient being monitored Patient Re-evaluated:Patient Re-evaluated prior to inductionOxygen Delivery Method: Circle System Utilized Preoxygenation: Pre-oxygenation with 100% oxygen Intubation Type: IV induction Ventilation: Mask ventilation without difficulty Laryngoscope Size: Mac and 3 Tube type: Subglottic suction tube Tube size: 7.5 mm Number of attempts: 1 Airway Equipment and Method: Stylet and Oral airway Placement Confirmation: ETT inserted through vocal cords under direct vision,  positive ETCO2 and breath sounds checked- equal and bilateral Secured at: 21 cm Tube secured with: Tape Dental Injury: Teeth and Oropharynx as per pre-operative assessment

## 2017-02-06 ENCOUNTER — Inpatient Hospital Stay (HOSPITAL_COMMUNITY): Payer: Medicare HMO

## 2017-02-06 ENCOUNTER — Encounter (HOSPITAL_COMMUNITY): Payer: Self-pay | Admitting: Vascular Surgery

## 2017-02-06 LAB — CBC
HCT: 35.7 % — ABNORMAL LOW (ref 36.0–46.0)
HEMATOCRIT: 34.9 % — AB (ref 36.0–46.0)
HEMOGLOBIN: 11.3 g/dL — AB (ref 12.0–15.0)
Hemoglobin: 12.2 g/dL (ref 12.0–15.0)
MCH: 31.9 pg (ref 26.0–34.0)
MCH: 30.1 pg (ref 26.0–34.0)
MCHC: 34.2 g/dL (ref 30.0–36.0)
MCHC: 32.4 g/dL (ref 30.0–36.0)
MCV: 93.2 fL (ref 78.0–100.0)
MCV: 93.1 fL (ref 78.0–100.0)
PLATELETS: 181 10*3/uL (ref 150–400)
Platelets: 154 10*3/uL (ref 150–400)
RBC: 3.83 MIL/uL — ABNORMAL LOW (ref 3.87–5.11)
RBC: 3.75 MIL/uL — AB (ref 3.87–5.11)
RDW: 13.9 % (ref 11.5–15.5)
RDW: 13.9 % (ref 11.5–15.5)
WBC: 15.8 10*3/uL — ABNORMAL HIGH (ref 4.0–10.5)
WBC: 16.3 10*3/uL — AB (ref 4.0–10.5)

## 2017-02-06 LAB — COMPREHENSIVE METABOLIC PANEL
ALT: 14 U/L (ref 14–54)
AST: 26 U/L (ref 15–41)
Albumin: 2.9 g/dL — ABNORMAL LOW (ref 3.5–5.0)
Alkaline Phosphatase: 48 U/L (ref 38–126)
Anion gap: 6 (ref 5–15)
BUN: 9 mg/dL (ref 6–20)
CO2: 23 mmol/L (ref 22–32)
Calcium: 7.7 mg/dL — ABNORMAL LOW (ref 8.9–10.3)
Chloride: 106 mmol/L (ref 101–111)
Creatinine, Ser: 0.7 mg/dL (ref 0.44–1.00)
GFR calc Af Amer: >60 mL/min (ref >60)
GFR calc non Af Amer: >60 mL/min (ref >60)
Glucose, Bld: 127 mg/dL — ABNORMAL HIGH (ref 65–99)
Potassium: 3.9 mmol/L (ref 3.5–5.1)
Sodium: 135 mmol/L (ref 135–145)
Total Bilirubin: 0.8 mg/dL (ref 0.3–1.2)
Total Protein: 4.9 g/dL — ABNORMAL LOW (ref 6.5–8.1)

## 2017-02-06 LAB — AMYLASE: Amylase: 46 U/L (ref 28–100)

## 2017-02-06 LAB — MAGNESIUM: Magnesium: 1.5 mg/dL — ABNORMAL LOW (ref 1.7–2.4)

## 2017-02-06 LAB — TROPONIN I
Troponin I: <0.03 ng/mL (ref <0.03)
Troponin I: <0.03 ng/mL (ref <0.03)

## 2017-02-06 MED ORDER — PROMETHAZINE HCL 25 MG/ML IJ SOLN: 12.5000 mg | Freq: Three times a day (TID) | INTRAMUSCULAR | Status: DC | PRN

## 2017-02-06 MED ORDER — MAGNESIUM SULFATE 2 GM/50ML IV SOLN
2.0000 g | Freq: Once | INTRAVENOUS | Status: AC
  Administered 2017-02-06: 2 g via INTRAVENOUS
  Filled 2017-02-06: qty 50

## 2017-02-06 MED ORDER — KETOROLAC TROMETHAMINE 15 MG/ML IJ SOLN
15.0000 mg | Freq: Once | INTRAMUSCULAR | Status: AC
  Administered 2017-02-06: 15 mg via INTRAVENOUS
  Filled 2017-02-06: qty 1

## 2017-02-06 MED ORDER — MAGNESIUM SULFATE BOLUS VIA INFUSION: 2.0000 g | Freq: Once | INTRAVENOUS | Status: DC

## 2017-02-06 MED FILL — Sodium Chloride IV Soln 0.9%: INTRAVENOUS | Qty: 1000 | Status: AC

## 2017-02-06 MED FILL — Heparin Sodium (Porcine) Inj 1000 Unit/ML: INTRAMUSCULAR | Qty: 30 | Status: AC

## 2017-02-06 NOTE — Progress Notes (Signed)
OT Cancellation Note  Patient Details Name: Stacy Garcia MRN: [id number] DOB: 1951-04-06   Cancelled Treatment:    Reason Eval/Treat Not Completed: Fatigue/lethargy limiting ability to participate.  Attempted x 2.  First attempt, pt had just finished with PT.  Second attempt, spouse reports she just returned from bathroom and is fatigued and in too much pain.  Will try again as schedule permits, or will reattempt tomorrow.  [place], OTR/L [phone]   Lucille Passy M 02/06/2017, 2:28 PM

## 2017-02-06 NOTE — Progress Notes (Signed)
Pt had a total of 3.4 mg of dilaudid PCA from 0800-1900. 25 demands and 17 delivered.

## 2017-02-06 NOTE — Evaluation (Signed)
Physical Therapy Evaluation Patient Details Name: Stacy Garcia MRN: [id number] DOB: 1951-02-02 Today's Date: 02/06/2017   History of Present Illness  Pt s/p open AAA repair on 02/05/17. PMH - Rt TKR, copd, arthritis, vertigo  Clinical Impression  Pt admitted with above diagnosis and presents to PT with functional limitations due to deficits listed below (See PT problem list). Pt needs skilled PT to maximize independence and safety to allow discharge to home with husband. Pt doing very well post-op and expect will progress rapidly.     Follow Up Recommendations No PT follow up;Supervision - Intermittent    Equipment Recommendations  None recommended by PT    Recommendations for Other Services       Precautions / Restrictions Precautions Precautions: None      Mobility  Bed Mobility Overal bed mobility: Needs Assistance Bed Mobility: Sidelying to Sit;Sit to Supine   Sidelying to sit: Min assist   Sit to supine: Supervision   General bed mobility comments: Assist to elevate trunk into sitting  Transfers Overall transfer level: Needs assistance Equipment used: Rolling walker (2 wheeled) Transfers: Sit to/from Stand Sit to Stand: Min guard         General transfer comment: Assist for safety  Ambulation/Gait Ambulation/Gait assistance: Min guard Ambulation Distance (Feet): 125 Feet Assistive device: Rolling walker (2 wheeled) Gait Pattern/deviations: Step-through pattern;Decreased stride length;Trunk flexed Gait velocity: decr Gait velocity interpretation: Below normal speed for age/gender General Gait Details: Assist for safety. Verbal cues to stand more erect. HR to 135 with amb. SpO2 93% on RA  Stairs            Wheelchair Mobility    Modified Rankin (Stroke Patients Only)       Balance Overall balance assessment: Needs assistance Sitting-balance support: No upper extremity supported;Feet supported Sitting balance-Leahy Scale: Good     Standing  balance support: No upper extremity supported Standing balance-Leahy Scale: Fair                               Pertinent Vitals/Pain Pain Assessment: Faces Faces Pain Scale: Hurts even more Pain Location: abdomen Pain Descriptors / Indicators: Grimacing;Guarding Pain Intervention(s): Limited activity within patient's tolerance;Monitored during session;PCA encouraged    Home Living Family/patient expects to be discharged to:: Private residence Living Arrangements: Spouse/significant other Available Help at Discharge: Family;Available PRN/intermittently Type of Home: House Home Access: Stairs to enter Entrance Stairs-Rails: None Entrance Stairs-Number of Steps: 1 Home Layout: Multi-level   Additional Comments: Pt states she can borrow walker if needed    Prior Function Level of Independence: Independent               Hand Dominance        Extremity/Trunk Assessment   Upper Extremity Assessment Upper Extremity Assessment: Defer to OT evaluation    Lower Extremity Assessment Lower Extremity Assessment: Overall WFL for tasks assessed       Communication   Communication: No difficulties  Cognition Arousal/Alertness: Awake/alert Behavior During Therapy: WFL for tasks assessed/performed Overall Cognitive Status: Within Functional Limits for tasks assessed                                        General Comments      Exercises     Assessment/Plan    PT Assessment Patient needs continued PT services  PT  Problem List Decreased activity tolerance;Decreased mobility;Decreased balance;Pain       PT Treatment Interventions DME instruction;Gait training;Functional mobility training;Stair training;Therapeutic activities;Therapeutic exercise;Balance training;Patient/family education    PT Goals (Current goals can be found in the Care Plan section)  Acute Rehab PT Goals Patient Stated Goal: return home PT Goal Formulation: With  patient Time For Goal Achievement: 02/13/17 Potential to Achieve Goals: Good    Frequency Min 3X/week   Barriers to discharge Inaccessible home environment split level home    Co-evaluation               AM-PAC PT "6 Clicks" Daily Activity  Outcome Measure Difficulty turning over in bed (including adjusting bedclothes, sheets and blankets)?: A Little Difficulty moving from lying on back to sitting on the side of the bed? : Total Difficulty sitting down on and standing up from a chair with arms (e.g., wheelchair, bedside commode, etc,.)?: A Little Help needed moving to and from a bed to chair (including a wheelchair)?: A Little Help needed walking in hospital room?: A Little Help needed climbing 3-5 steps with a railing? : A Little 6 Click Score: 16    End of Session   Activity Tolerance: Patient tolerated treatment well Patient left: in bed;with call bell/phone within reach;with family/visitor present Nurse Communication: Mobility status PT Visit Diagnosis: Difficulty in walking, not elsewhere classified (R26.2);Pain Pain - part of body:  (abdomen)    Time: 9147-8295 PT Time Calculation (min) (ACUTE ONLY): 19 min   Charges:   PT Evaluation $PT Eval Moderate Complexity: 1 Procedure     PT G CodesMarland Kitchen        [facility] [place] 02/06/2017, 2:12 PM

## 2017-02-06 NOTE — Progress Notes (Signed)
Called by nurse for persistant tachycaridia 120s Pt with adequate urine output Still having pain despite dilaudid pca  Vitals:   02/06/17 1029 02/06/17 1203 02/06/17 1534 02/06/17 1623  BP: 95/70  129/77   Pulse: (!) 114  (!) 122   Resp: 15 14 16  (!) (P) 96  Temp: 98 F (36.7 C)  98.3 F (36.8 C)   TempSrc: Oral  Oral   SpO2: 98% 95% 97%   Weight:      Height:         Will try one shot of toradol Will check EKG and cycle enzymes to rule out MI Will check CBC to rule out bleeding  Ruta Hinds, MD [facility] [place] Office: [phone] Pager: [phone]

## 2017-02-06 NOTE — Progress Notes (Addendum)
[facility] [place]  Subjective  - nausea abdomen hurts    Objective 90/62 (!) 119 98.3 F (36.8 C) (Oral) 14 96%  Intake/Output Summary (Last 24 hours) at 02/06/17 0732 Last data filed at 02/06/17 0700  Gross per 24 hour  Intake           6057.5 ml  Output             2905 ml  Net           3152.5 ml   2+ DP pulses NG minimal  Adequate urine output Abdomen incision clean  Assessment/Planning: Mild/Moderate protein calorie malnutrition feed when gut is ready Hypomagnesemia replete OOB ambulate Transfer 2w Continue PCA for now Leave NG today since nausea try phenergan since zofran not working  Ruta Hinds 02/06/2017 7:32 AM --  Laboratory Lab Results:  Recent Labs  02/05/17 1251 02/06/17 0406  WBC 17.7* 15.8*  HGB 12.2 12.2  HCT 37.3 35.7*  PLT 174 181   BMET  Recent Labs  02/05/17 1251 02/06/17 0406  NA 138 135  K 4.5 3.9  CL 110 106  CO2 24 23  GLUCOSE 137* 127*  BUN 12 9  CREATININE 0.83 0.70  CALCIUM 7.8* 7.7*    COAG Lab Results  Component Value Date   INR 1.28 02/05/2017   INR 1.00 01/28/2017   INR 1.02 01/12/2016   No results found for: PTT

## 2017-02-06 NOTE — Anesthesia Postprocedure Evaluation (Signed)
Anesthesia Post Note  Patient: Stacy Garcia  Procedure(s) Performed: Procedure(s) (LRB): ANEURYSM ABDOMINAL AORTIC REPAIR (N/A)     Patient location during evaluation: PACU Anesthesia Type: General Level of consciousness: awake and alert and patient cooperative Pain management: pain level controlled Vital Signs Assessment: post-procedure vital signs reviewed and stable Respiratory status: spontaneous breathing and respiratory function stable Cardiovascular status: stable Anesthetic complications: no    Last Vitals:  Vitals:   02/06/17 1534 02/06/17 1623  BP: 129/77   Pulse: (!) 122   Resp: 16 14  Temp: 36.8 C     Last Pain:  Vitals:   02/06/17 1739  TempSrc:   PainSc: 3    Pain Goal: Patients Stated Pain Goal: 3 (02/06/17 0758)               [place]

## 2017-02-06 NOTE — Progress Notes (Signed)
Attempted to give report. RN not available at this time to take report. Phone number given to Network engineer. Will attempt to call again in 5 to 10 minutes.

## 2017-02-07 LAB — CBC
HCT: 32.3 % — ABNORMAL LOW (ref 36.0–46.0)
Hemoglobin: 10.2 g/dL — ABNORMAL LOW (ref 12.0–15.0)
MCH: 29.8 pg (ref 26.0–34.0)
MCHC: 31.6 g/dL (ref 30.0–36.0)
MCV: 94.4 fL (ref 78.0–100.0)
Platelets: 176 10*3/uL (ref 150–400)
RBC: 3.42 MIL/uL — ABNORMAL LOW (ref 3.87–5.11)
RDW: 14.1 % (ref 11.5–15.5)
WBC: 14.1 10*3/uL — ABNORMAL HIGH (ref 4.0–10.5)

## 2017-02-07 LAB — BASIC METABOLIC PANEL
Anion gap: 5 (ref 5–15)
BUN: 7 mg/dL (ref 6–20)
CALCIUM: 8 mg/dL — AB (ref 8.9–10.3)
CO2: 26 mmol/L (ref 22–32)
CREATININE: 0.66 mg/dL (ref 0.44–1.00)
Chloride: 102 mmol/L (ref 101–111)
GFR calc non Af Amer: >60 mL/min (ref >60)
Glucose, Bld: 143 mg/dL — ABNORMAL HIGH (ref 65–99)
Potassium: 4.2 mmol/L (ref 3.5–5.1)
SODIUM: 133 mmol/L — AB (ref 135–145)

## 2017-02-07 LAB — TROPONIN I: Troponin I: 0.03 ng/mL (ref <0.03)

## 2017-02-07 MED ORDER — POTASSIUM CHLORIDE IN NACL 20-0.9 MEQ/L-% IV SOLN
INTRAVENOUS | Status: DC
  Administered 2017-02-07 (×2): via INTRAVENOUS
  Filled 2017-02-07 (×2): qty 1000

## 2017-02-07 NOTE — Progress Notes (Addendum)
  [facility] Progress Note  Subjective  - POD #2  Nausea is better. Pain is ok at times. No flatus.   Objective Vitals:   02/06/17 2356 02/07/17 0429  BP: 122/69 126/69  Pulse: (!) 121 (!) 124  Resp: 19 18  Temp: 98.4 F (36.9 C) 98 F (36.7 C)    Intake/Output Summary (Last 24 hours) at 02/07/17 0742 Last data filed at 02/06/17 1400  Gross per 24 hour  Intake            117.5 ml  Output              585 ml  Net           -467.5 ml   Heart: tachy Abdomen: no bowel sounds, soft and non-tender, incision clean and intact Palpable DP pulses bilaterally  Assessment/Planning: 66 y.o. female is s/p: open AAA repair 2 Days Post-Op   Minimal NG output 10-20 cc out last shift. Nausea better. Will discuss taking NG out with Dr. Oneida Alar. Will keep NPO today.  Continue to mobilize. Keep PCA until take po's.  Troponin this am minimally elevated at 0.03, first two troponins normal. Sinus tachy on EKG. No chest pain. ABLA stable.   Alvia [place] 02/07/2017 7:42 AM --  D/c NG Ambulate Change IV fluid to 75 cc/hr change to NS with KCl since hyponatremic Foley out  Ruta Hinds, MD [facility] [place] Office: [phone] Pager: [phone]   Laboratory CBC    Component Value Date/Time   WBC 14.1 (H) 02/07/2017 0523   HGB 10.2 (L) 02/07/2017 0523   HCT 32.3 (L) 02/07/2017 0523   PLT 176 02/07/2017 0523    BMET    Component Value Date/Time   NA 133 (L) 02/07/2017 0523   K 4.2 02/07/2017 0523   CL 102 02/07/2017 0523   CO2 26 02/07/2017 0523   GLUCOSE 143 (H) 02/07/2017 0523   GLUCOSE 87 07/15/2006 1043   BUN 7 02/07/2017 0523   CREATININE 0.66 02/07/2017 0523   CALCIUM 8.0 (L) 02/07/2017 0523   GFRNONAA >60 02/07/2017 0523   GFRAA >60 02/07/2017 0523    COAG Lab Results  Component Value Date   INR 1.28 02/05/2017   INR 1.00 01/28/2017   INR 1.02 01/12/2016   No results found for:  PTT  Antibiotics Anti-infectives    Start     Dose/Rate Route Frequency Ordered Stop   02/05/17 1445  cefUROXime (ZINACEF) 1.5 g in dextrose 5 % 50 mL IVPB     1.5 g 100 mL/hr over 30 Minutes Intravenous Every 12 hours 02/05/17 1438 02/06/17 0330   02/05/17 0552  dextrose 5 % with cefUROXime (ZINACEF) ADS Med    Comments:  Starleen Arms   : cabinet override      02/05/17 0552 02/05/17 0830   02/05/17 0551  cefUROXime (ZINACEF) 1.5 g in dextrose 5 % 50 mL IVPB     1.5 g 100 mL/hr over 30 Minutes Intravenous 30 min pre-op 02/05/17 0551 02/05/17 [place], PA-C [facility] Office: [phone] Pager: [phone] 02/07/2017 7:42 AM

## 2017-02-07 NOTE — Progress Notes (Signed)
Physical Therapy Treatment Patient Details Name: Stacy Garcia MRN: [id number] DOB: [birth year] Today's Date: 02/07/2017    History of Present Illness Pt s/p open AAA repair on 02/05/17. PMH - Rt TKR, copd, arthritis, vertigo    PT Comments    Pt pleasant and willing to mobilize. Pt eager to get NGT out and to eat. Pt able to maintain SpO2 on RA above 93% throughout, HR up to 145 with gait with pt reporting dizziness and fatigue end of ambulation and denied further activity or HEP. Pt able to walk to bathroom with RW and perform pericare without assist. Pt encouraged to walk with nursing and perform HEP later today. Will continue to follow, pt progressing but not as quickly as anticipated.    Follow Up Recommendations  No PT follow up;Supervision - Intermittent     Equipment Recommendations  None recommended by PT    Recommendations for Other Services       Precautions / Restrictions Precautions Precautions: Fall Precaution Comments: NGT    Mobility  Bed Mobility Overal bed mobility: Modified Independent Bed Mobility: Supine to Sit;Sit to Supine           General bed mobility comments: increased time with HOB 30degrees  Transfers Overall transfer level: Needs assistance   Transfers: Sit to/from Stand Sit to Stand: Supervision         General transfer comment: supervision for lines  Ambulation/Gait Ambulation/Gait assistance: Min guard Ambulation Distance (Feet): 120 Feet Assistive device: Rolling walker (2 wheeled) Gait Pattern/deviations: Step-through pattern;Decreased stride length;Trunk flexed   Gait velocity interpretation: Below normal speed for age/gender General Gait Details: cues for posture and position in RW. Slow steady gait with HR up to 145 with gait, SpO2 93% on RA with gait   Stairs            Wheelchair Mobility    Modified Rankin (Stroke Patients Only)       Balance Overall balance assessment: Needs assistance   Sitting  balance-Leahy Scale: Good       Standing balance-Leahy Scale: Fair                              Cognition Arousal/Alertness: Awake/alert Behavior During Therapy: WFL for tasks assessed/performed Overall Cognitive Status: Within Functional Limits for tasks assessed                                        Exercises      General Comments        Pertinent Vitals/Pain Pain Assessment: 0-10 Faces Pain Scale: Hurts even more Pain Location: abdomen Pain Descriptors / Indicators: Grimacing;Guarding Pain Intervention(s): Limited activity within patient's tolerance;Repositioned;PCA encouraged;Monitored during session    Home Living                      Prior Function            PT Goals (current goals can now be found in the care plan section) Progress towards PT goals: Progressing toward goals    Frequency           PT Plan Current plan remains appropriate    Co-evaluation              AM-PAC PT "6 Clicks" Daily Activity  Outcome Measure  Difficulty turning over in bed (including adjusting bedclothes,  sheets and blankets)?: A Little Difficulty moving from lying on back to sitting on the side of the bed? : A Lot Difficulty sitting down on and standing up from a chair with arms (e.g., wheelchair, bedside commode, etc,.)?: A Little Help needed moving to and from a bed to chair (including a wheelchair)?: A Little Help needed walking in hospital room?: A Little Help needed climbing 3-5 steps with a railing? : A Little 6 Click Score: 17    End of Session   Activity Tolerance: Patient tolerated treatment well Patient left: in bed;with call bell/phone within reach Nurse Communication: Mobility status PT Visit Diagnosis: Difficulty in walking, not elsewhere classified (R26.2)     Time: 2878-6767 PT Time Calculation (min) (ACUTE ONLY): 23 min  Charges:  $Gait Training: 8-22 mins $Therapeutic Activity: 8-22 mins                     G Codes:       Elwyn Reach, [place]    [place] 02/07/2017, 9:52 AM

## 2017-02-07 NOTE — Evaluation (Signed)
Occupational Therapy Evaluation Patient Details Name: Stacy Garcia MRN: [id number] DOB: [birth year] Today's Date: 02/07/2017    History of Present Illness Pt s/p open AAA repair on 02/05/17. PMH - Rt TKR, copd, arthritis, vertigo   Clinical Impression   Pt was independent prior to admission. Presents with post operative abdominal pain and decreased activity tolerance. Stood at sink for oral care and toileted with min guard assist. Pt able to cross her foot over opposite knee to don and doff socks. Pt likely to progress well and pain decreases. Do not anticipate she will need post acute OT. Will follow acutely.    Follow Up Recommendations  No OT follow up    Equipment Recommendations  3 in 1 bedside commode    Recommendations for Other Services       Precautions / Restrictions Precautions Precautions: Fall Precaution Comments: NGT Restrictions Weight Bearing Restrictions: No      Mobility Bed Mobility Overal bed mobility: Modified Independent Bed Mobility: Supine to Sit;Sit to Supine           General bed mobility comments: increased time with HOB 30 degrees, encouraged log roll for comfort  Transfers Overall transfer level: Needs assistance Equipment used: Rolling walker (2 wheeled) Transfers: Sit to/from Stand Sit to Stand: Supervision         General transfer comment: supervision for lines    Balance Overall balance assessment: Needs assistance Sitting-balance support: No upper extremity supported;Feet supported Sitting balance-Leahy Scale: Good     Standing balance support: No upper extremity supported Standing balance-Leahy Scale: Fair                             ADL either performed or assessed with clinical judgement   ADL Overall ADL's : Needs assistance/impaired Eating/Feeding: NPO   Grooming: Oral care;Standing;Min guard   Upper Body Bathing: Set up;Sitting   Lower Body Bathing: Min guard;Sit to/from stand   Upper Body  Dressing : Set up;Sitting   Lower Body Dressing: Min guard;Sit to/from stand   Toilet Transfer: Min guard;RW;Ambulation   Toileting- Water quality scientist and Hygiene: Minimal assistance;Sit to/from stand       Functional mobility during ADLs: Min guard;Rolling walker       Vision Patient Visual Report: No change from baseline       Perception     Praxis      Pertinent Vitals/Pain Pain Assessment: Faces Faces Pain Scale: Hurts even more Pain Location: abdomen Pain Descriptors / Indicators: Grimacing;Guarding Pain Intervention(s): PCA encouraged;Repositioned;Monitored during session     Hand Dominance Right   Extremity/Trunk Assessment Upper Extremity Assessment Upper Extremity Assessment: Overall WFL for tasks assessed   Lower Extremity Assessment Lower Extremity Assessment: Defer to PT evaluation       Communication Communication Communication: No difficulties   Cognition Arousal/Alertness: Awake/alert Behavior During Therapy: WFL for tasks assessed/performed Overall Cognitive Status: Within Functional Limits for tasks assessed                                     General Comments       Exercises     Shoulder Instructions      Home Living Family/patient expects to be discharged to:: Private residence Living Arrangements: Spouse/significant other Available Help at Discharge: Family;Available PRN/intermittently Type of Home: House Home Access: Stairs to enter Entrance Stairs-Number of Steps: 1 Entrance Stairs-Rails: None  Home Layout: Multi-level Alternate Level Stairs-Number of Steps: 6 Alternate Level Stairs-Rails: Right;Left Bathroom Shower/Tub: Teacher, early years/pre: Standard     Home Equipment: Walker - 2 wheels   Additional Comments: Pt states she can borrow walker if needed      Prior Functioning/Environment Level of Independence: Independent                 OT Problem List: Decreased activity  tolerance;Impaired balance (sitting and/or standing);Decreased knowledge of use of DME or AE;Pain      OT Treatment/Interventions: Self-care/ADL training;DME and/or AE instruction;Patient/family education    OT Goals(Current goals can be found in the care plan section) Acute Rehab OT Goals Patient Stated Goal: return home OT Goal Formulation: With patient Time For Goal Achievement: 02/14/17 Potential to Achieve Goals: Good ADL Goals Pt Will Perform Grooming: with modified independence;standing Pt Will Transfer to Toilet: with modified independence;ambulating;bedside commode (over toilet) Pt Will Perform Toileting - Clothing Manipulation and hygiene: with modified independence;sit to/from stand Pt Will Perform Tub/Shower Transfer: Tub transfer;with supervision;ambulating  OT Frequency: Min 2X/week   Barriers to D/C:            Co-evaluation              AM-PAC PT "6 Clicks" Daily Activity     Outcome Measure Help from another person eating meals?: None Help from another person taking care of personal grooming?: A Little Help from another person toileting, which includes using toliet, bedpan, or urinal?: A Little Help from another person bathing (including washing, rinsing, drying)?: A Little Help from another person to put on and taking off regular upper body clothing?: None Help from another person to put on and taking off regular lower body clothing?: A Little 6 Click Score: 20   End of Session Equipment Utilized During Treatment: Rolling walker  Activity Tolerance: Patient tolerated treatment well Patient left: in bed;with call bell/phone within reach  OT Visit Diagnosis: Unsteadiness on feet (R26.81);Pain                Time: 1005-1020 OT Time Calculation (min): 15 min Charges:  OT General Charges $OT Visit: 1 Procedure OT Evaluation $OT Eval Moderate Complexity: 1 Procedure G-Codes:     Malka So 02/07/2017, 10:25 AM  [phone]

## 2017-02-07 NOTE — Consult Note (Signed)
[facility] CM Primary Care Navigator  02/07/2017  Stacy Garcia 04-Aug-1951 [id number]   Met with patient, husband Stacy Garcia) and stepmother Stacy Garcia) at the bedside to identify possible discharge needs.  Patient reports having worsening abdominal pain which had led to this admission/ surgery. Patient endorses Stacy Dinning, NP with [employer] [facility] [place] as her primary care provider.   [facility].  She verbalized managing her ownmedications at home straight out of the containers.  Patient states that she drives prior to admission, however, husband, son or daughter will provide transportation to her doctors' appointments after discharge.   Her husband is the primary caregiver at home per patient.                                                                              Anticipated discharge plan is home according to patient..  Patient and husband voiced understanding to call primary care provider's office when she gets backhome, for a post discharge follow-up appointment within a week or sooner if needs arise.Patient letter (with PCP's contact number) wasprovided as theirreminder.  Discussed with [facility]regarding [facility] care [facility] services available for her but patient communicated no current issues or concerns and denied any needs for  health management at this time.  [facility] care management contact information provided for any future needs that she may have.   For questions, please contact:  Stacy Garcia, BSN, RN- [facility] Primary Care Navigator  Telephone: [phone] [place]

## 2017-02-08 LAB — BASIC METABOLIC PANEL
ANION GAP: 7 (ref 5–15)
BUN: 7 mg/dL (ref 6–20)
CO2: 24 mmol/L (ref 22–32)
Calcium: 8.1 mg/dL — ABNORMAL LOW (ref 8.9–10.3)
Chloride: 100 mmol/L — ABNORMAL LOW (ref 101–111)
Creatinine, Ser: 0.65 mg/dL (ref 0.44–1.00)
GLUCOSE: 88 mg/dL (ref 65–99)
POTASSIUM: 3.8 mmol/L (ref 3.5–5.1)
Sodium: 131 mmol/L — ABNORMAL LOW (ref 135–145)

## 2017-02-08 LAB — CBC
HEMATOCRIT: 30.8 % — AB (ref 36.0–46.0)
Hemoglobin: 9.8 g/dL — ABNORMAL LOW (ref 12.0–15.0)
MCH: 30.1 pg (ref 26.0–34.0)
MCHC: 31.8 g/dL (ref 30.0–36.0)
MCV: 94.5 fL (ref 78.0–100.0)
PLATELETS: 168 10*3/uL (ref 150–400)
RBC: 3.26 MIL/uL — AB (ref 3.87–5.11)
RDW: 13.6 % (ref 11.5–15.5)
WBC: 9.2 10*3/uL (ref 4.0–10.5)

## 2017-02-08 MED ORDER — POTASSIUM CHLORIDE CRYS ER 20 MEQ PO TBCR
40.0000 meq | EXTENDED_RELEASE_TABLET | Freq: Once | ORAL | Status: AC
  Administered 2017-02-08: 40 meq via ORAL
  Filled 2017-02-08: qty 2

## 2017-02-08 MED ORDER — OXYCODONE-ACETAMINOPHEN 5-325 MG PO TABS
1.0000 | ORAL_TABLET | ORAL | Status: DC | PRN
  Administered 2017-02-09: 2 via ORAL
  Administered 2017-02-09: 1 via ORAL
  Administered 2017-02-10 (×2): 2 via ORAL
  Filled 2017-02-08 (×4): qty 2

## 2017-02-08 MED ORDER — HYDROMORPHONE HCL 1 MG/ML IJ SOLN
0.5000 mg | INTRAMUSCULAR | Status: DC | PRN
  Administered 2017-02-08 – 2017-02-09 (×7): 1 mg via INTRAVENOUS
  Filled 2017-02-08 (×7): qty 1

## 2017-02-08 MED ORDER — FUROSEMIDE 10 MG/ML IJ SOLN
20.0000 mg | Freq: Once | INTRAMUSCULAR | Status: AC
  Administered 2017-02-08: 20 mg via INTRAVENOUS
  Filled 2017-02-08: qty 2

## 2017-02-08 NOTE — Plan of Care (Signed)
Problem: Bowel/Gastric: Goal: Gastrointestinal status for postoperative course will improve PT tolerating clear liquid well, no nausea/vomitting this PM  Problem: Respiratory: Goal: Levels of oxygenation will improve Outcome: Progressing Sats 96% on room air

## 2017-02-08 NOTE — Progress Notes (Signed)
Dilaudid PCA 50mls wasted in sink . Witnessed by Bernadene Person RN. Susie Cassette RN

## 2017-02-08 NOTE — Progress Notes (Addendum)
  [facility] Progress Note  Subjective  - POD #3  No nausea. Passing flatus. Some incisional pain.   Objective Vitals:   02/08/17 0736 02/08/17 0737  BP:    Pulse:    Resp: 17 17  Temp:      Intake/Output Summary (Last 24 hours) at 02/08/17 1025 Last data filed at 02/08/17 0800  Gross per 24 hour  Intake             1100 ml  Output              300 ml  Net              800 ml   Midline abdominal incision clean and intact. Abdomen soft. Palpable DP pulses bilaterally  Assessment/Planning: 66 y.o. female is s/p: open AAA repair 3 Days Post-Op   Start on clear liquids.  D/c IVF and PCA. IV pain medication for pain until certain that patient is able to tolerate clears.  Mobilize.   Alvia Grove 02/08/2017 10:25 AM -- Agree.  Advance diet tomorrow if tolerates clears D/c PCA, saline lock Diurese some today Wean off O2  Ruta Hinds, MD [facility] [place]: [phone] Pager: [phone]  Laboratory CBC    Component Value Date/Time   WBC 9.2 02/08/2017 0439   HGB 9.8 (L) 02/08/2017 0439   HCT 30.8 (L) 02/08/2017 0439   PLT 168 02/08/2017 0439    BMET    Component Value Date/Time   NA 131 (L) 02/08/2017 0439   K 3.8 02/08/2017 0439   CL 100 (L) 02/08/2017 0439   CO2 24 02/08/2017 0439   GLUCOSE 88 02/08/2017 0439   GLUCOSE 87 07/15/2006 1043   BUN 7 02/08/2017 0439   CREATININE 0.65 02/08/2017 0439   CALCIUM 8.1 (L) 02/08/2017 0439   GFRNONAA >60 02/08/2017 0439   GFRAA >60 02/08/2017 0439    COAG Lab Results  Component Value Date   INR 1.28 02/05/2017   INR 1.00 01/28/2017   INR 1.02 01/12/2016   No results found for: PTT  Antibiotics Anti-infectives    Start     Dose/Rate Route Frequency Ordered Stop   02/05/17 1445  cefUROXime (ZINACEF) 1.5 g in dextrose 5 % 50 mL IVPB     1.5 g 100 mL/hr over 30 Minutes Intravenous Every 12 hours 02/05/17 1438 02/06/17 0330   02/05/17 0552   dextrose 5 % with cefUROXime (ZINACEF) ADS Med    Comments:  Starleen Arms   : cabinet override      02/05/17 0552 02/05/17 0830   02/05/17 0551  cefUROXime (ZINACEF) 1.5 g in dextrose 5 % 50 mL IVPB     1.5 g 100 mL/hr over 30 Minutes Intravenous 30 min pre-op 02/05/17 0551 02/05/17 [place], PA-C [facility] Office: [phone] Pager: [phone] 02/08/2017 10:25 AM

## 2017-02-09 LAB — BASIC METABOLIC PANEL
ANION GAP: 6 (ref 5–15)
BUN: 7 mg/dL (ref 6–20)
CALCIUM: 8.3 mg/dL — AB (ref 8.9–10.3)
CO2: 28 mmol/L (ref 22–32)
Chloride: 98 mmol/L — ABNORMAL LOW (ref 101–111)
Creatinine, Ser: 0.55 mg/dL (ref 0.44–1.00)
GFR calc Af Amer: >60 mL/min (ref >60)
GLUCOSE: 108 mg/dL — AB (ref 65–99)
Potassium: 3.5 mmol/L (ref 3.5–5.1)
Sodium: 132 mmol/L — ABNORMAL LOW (ref 135–145)

## 2017-02-09 MED ORDER — BISACODYL 10 MG RE SUPP
10.0000 mg | Freq: Once | RECTAL | Status: DC
  Filled 2017-02-09: qty 1

## 2017-02-09 MED ORDER — PANTOPRAZOLE SODIUM 40 MG PO TBEC
40.0000 mg | DELAYED_RELEASE_TABLET | Freq: Every day | ORAL | Status: DC
  Administered 2017-02-09 – 2017-02-10 (×2): 40 mg via ORAL
  Filled 2017-02-09: qty 1

## 2017-02-09 NOTE — Progress Notes (Addendum)
  [facility] Progress Note  Subjective  - POD #4  No nausea. Passing flatus.   Objective Vitals:   02/08/17 1944 02/09/17 0543  BP: 120/67 113/75  Pulse: 100 93  Resp: 16 16  Temp: 98.3 F (36.8 C) 97.9 F (36.6 C)    Intake/Output Summary (Last 24 hours) at 02/09/17 0757 Last data filed at 02/08/17 0800  Gross per 24 hour  Intake            700.8 ml  Output                0 ml  Net            700.8 ml   Abdomen soft. Active BS. No distension. Mild tenderness to palpation diffusely.  2+ DP pulses bilaterally.   Assessment/Planning: 66 y.o. female is s/p: open AAA repair 4 Days Post-Op   Tolerated clears. Advance to regular.  Suppository today. Ambulate.  D/c home tomorrow if tolerates regular diet.   Alvia Grove 02/09/2017 7:57 AM -- Incision clean, having BMs Not really eating much Ambulate today. Diet as tolerated. Possibly home tomorrow if she is able to take in reasonable amount PO  Ruta Hinds, MD [facility] [place]: [phone] Pager: [phone]  Laboratory CBC    Component Value Date/Time   WBC 9.2 02/08/2017 0439   HGB 9.8 (L) 02/08/2017 0439   HCT 30.8 (L) 02/08/2017 0439   PLT 168 02/08/2017 0439    BMET    Component Value Date/Time   NA 132 (L) 02/09/2017 0219   K 3.5 02/09/2017 0219   CL 98 (L) 02/09/2017 0219   CO2 28 02/09/2017 0219   GLUCOSE 108 (H) 02/09/2017 0219   GLUCOSE 87 07/15/2006 1043   BUN 7 02/09/2017 0219   CREATININE 0.55 02/09/2017 0219   CALCIUM 8.3 (L) 02/09/2017 0219   GFRNONAA >60 02/09/2017 0219   GFRAA >60 02/09/2017 0219    COAG Lab Results  Component Value Date   INR 1.28 02/05/2017   INR 1.00 01/28/2017   INR 1.02 01/12/2016   No results found for: PTT  Antibiotics Anti-infectives    Start     Dose/Rate Route Frequency Ordered Stop   02/05/17 1445  cefUROXime (ZINACEF) 1.5 g in dextrose 5 % 50 mL IVPB     1.5 g 100 mL/hr over 30  Minutes Intravenous Every 12 hours 02/05/17 1438 02/06/17 0330   02/05/17 0552  dextrose 5 % with cefUROXime (ZINACEF) ADS Med    Comments:  Starleen Arms   : cabinet override      02/05/17 0552 02/05/17 0830   02/05/17 0551  cefUROXime (ZINACEF) 1.5 g in dextrose 5 % 50 mL IVPB     1.5 g 100 mL/hr over 30 Minutes Intravenous 30 min pre-op 02/05/17 0551 02/05/17 [place], PA-C [facility] Office: [phone] Pager: [phone] 02/09/2017 7:57 AM

## 2017-02-09 NOTE — Plan of Care (Signed)
Problem: Nutrition: Goal: Adequate nutrition will be maintained Outcome: Progressing Pt tolerating regular diet, no nausea this PM  Problem: Bowel/Gastric: Goal: Will not experience complications related to bowel motility Outcome: Progressing LBM 6/3  Problem: Education: Goal: Knowledge of the prescribed therapeutic regimen will improve Outcome: Progressing Discussed w/ patient and family need to transition to PO pain medicine, minimize use of IV pain meds

## 2017-02-10 ENCOUNTER — Telehealth: Payer: Self-pay | Admitting: Vascular Surgery

## 2017-02-10 LAB — TYPE AND SCREEN
ABO/RH(D): A POS
ANTIBODY SCREEN: NEGATIVE
UNIT DIVISION: 0
UNIT DIVISION: 0
Unit division: 0
Unit division: 0

## 2017-02-10 LAB — BPAM RBC
BLOOD PRODUCT EXPIRATION DATE: 201806132359
BLOOD PRODUCT EXPIRATION DATE: 201806132359
Blood Product Expiration Date: 201806132359
Blood Product Expiration Date: 201806132359
ISSUE DATE / TIME: 201805300739
ISSUE DATE / TIME: 201805300739
Unit Type and Rh: 6200
Unit Type and Rh: 6200
Unit Type and Rh: 6200
Unit Type and Rh: 6200

## 2017-02-10 MED ORDER — DIAZEPAM 5 MG PO TABS: ORAL_TABLET | ORAL | 0 refills | Status: DC

## 2017-02-10 MED ORDER — OXYCODONE-ACETAMINOPHEN 5-325 MG PO TABS: 1.0000 | ORAL_TABLET | Freq: Four times a day (QID) | ORAL | 0 refills | Status: DC | PRN

## 2017-02-10 NOTE — Telephone Encounter (Signed)
Sched appt 03/13/17 at 12:45. Spoke to pt to confirm appt.

## 2017-02-10 NOTE — Telephone Encounter (Signed)
-----   Message from Mena Goes, RN sent at 02/10/2017  9:42 AM EDT ----- Regarding: 2-4 weeks postop open AAA repair   ----- Message ----- From: Alvia Grove, PA-C Sent: 02/10/2017   7:40 AM To: Vvs Charge Pool  S/p open AAA repair 02/05/17  F/u with Dr. Oneida Alar in 2-4 weeks  Thanks Maudie Mercury

## 2017-02-10 NOTE — Care Management Important Message (Signed)
Important Message  Patient Details  Name: Stacy Garcia MRN: [id number] Date of Birth: 10-24-1950   Medicare Important Message Given:  Yes    Nathen May 02/10/2017, 12:35 PM

## 2017-02-10 NOTE — Care Management Note (Signed)
Case Management Note Marvetta Gibbons RN, BSN Unit 2W-Case Manager [phone]  Patient Details  Name: Stacy Garcia MRN: [id number] Date of Birth: [birth year]  Subjective/Objective:    Pt admitted s/p AAA repair                Action/Plan: PTA pt lived at home with spouse- plan to return home with spouse- Per PT/OT evals- no recommendations for [facility] services- per Tiffany with [facility] pt had pre-op referral to them for services- they will f/u with pt on return home. No other CM needs noted.   Expected Discharge Date:  02/10/17               Expected Discharge Plan:  Home/Self Care  In-House Referral:     Discharge planning Services  CM Consult  Post Acute Care Choice:  Home Health Choice offered to:     DME Arranged:    DME Agency:     HH Arranged:    [place]:  [place]  Status of Service:  Completed, signed off  If discussed at [place] of Stay Meetings, dates discussed:    Discharge Disposition: home/self care   Additional Comments:  Dawayne Patricia, RN 02/10/2017, 10:42 AM

## 2017-02-10 NOTE — Progress Notes (Addendum)
  [facility] Progress Note  Subjective   No nausea. Had BM yesterday. Ate about 30% of dinner last night.   Objective Vitals:   02/09/17 1957 02/10/17 0513  BP: 128/68 115/73  Pulse: 98 80  Resp: 17 16  Temp: 97.8 F (36.6 C) 98 F (36.7 C)   No intake or output data in the 24 hours ending 02/10/17 0737  Abdomen soft and non tender. Incision clean and intact.  2+ DP pulses bilaterally.   Assessment/Planning: 66 y.o. female is s/p: open AAA repair 5 Days Post-Op   Tolerated regular diet but only ate about 30% of it.  Had BM yesterday. Ambulating well without assistance.  Will see how she does with breakfast this am. If ok, d/c home.   Alvia Grove 02/10/2017 7:37 AM -- Agree with above.  Pt encouraged to eat at home. D/c today  Ruta Hinds, MD [facility] [place] Office: [phone] Pager: [phone]  Laboratory CBC    Component Value Date/Time   WBC 9.2 02/08/2017 0439   HGB 9.8 (L) 02/08/2017 0439   HCT 30.8 (L) 02/08/2017 0439   PLT 168 02/08/2017 0439    BMET    Component Value Date/Time   NA 132 (L) 02/09/2017 0219   K 3.5 02/09/2017 0219   CL 98 (L) 02/09/2017 0219   CO2 28 02/09/2017 0219   GLUCOSE 108 (H) 02/09/2017 0219   GLUCOSE 87 07/15/2006 1043   BUN 7 02/09/2017 0219   CREATININE 0.55 02/09/2017 0219   CALCIUM 8.3 (L) 02/09/2017 0219   GFRNONAA >60 02/09/2017 0219   GFRAA >60 02/09/2017 0219    COAG Lab Results  Component Value Date   INR 1.28 02/05/2017   INR 1.00 01/28/2017   INR 1.02 01/12/2016   No results found for: PTT  Antibiotics Anti-infectives    Start     Dose/Rate Route Frequency Ordered Stop   02/05/17 1445  cefUROXime (ZINACEF) 1.5 g in dextrose 5 % 50 mL IVPB     1.5 g 100 mL/hr over 30 Minutes Intravenous Every 12 hours 02/05/17 1438 02/06/17 0330   02/05/17 0552  dextrose 5 % with cefUROXime (ZINACEF) ADS Med    Comments:  Starleen Arms   : cabinet  override      02/05/17 0552 02/05/17 0830   02/05/17 0551  cefUROXime (ZINACEF) 1.5 g in dextrose 5 % 50 mL IVPB     1.5 g 100 mL/hr over 30 Minutes Intravenous 30 min pre-op 02/05/17 0551 02/05/17 [place], PA-C [facility] Office: [phone] Pager: [phone] 02/10/2017 7:37 AM

## 2017-02-11 NOTE — Discharge Summary (Signed)
[facility] AAA Discharge Summary  Stacy Garcia 1951-04-06 66 y.o. female  [id number]  Admission Date: 02/05/2017  Discharge Date: 02/10/2017  Physician: Ruta Hinds, MD  Admission Diagnosis: Abdominal aortic aneurysm I71.4  HPI:   This is a 66 y.o. female  with known abdominal aortic aneurysm. The aneurysm currently is 5.8 cm in diameter. On recent review of her images she is not a candidate for stent graft repair. The patient does have a history of significant COPD. She was recently seen by pulmonary and thought to be a reasonable candidate for open repair. Her prior FEV1 is 45% of predicted in February 2017. She denies any chest pain or cardiac-related symptoms. She currently denies any abdominal pain. She has chronic back pain. He is on aspirin and statin.  Patient currently is still smoking but is trying to quit with the assistance of Chantix.  Hospital Course:  The patient was admitted to the hospital and taken to the operating room on 02/05/2017 and underwent: open AAA repair    The patient tolerated the procedure well and was transported to the PACU in stable condition.   She was stable on POD 1. Her NG tube was kept in. She was transferred out of the ICU on POD 1. She had palpable pedal pulses bilaterally. She had persistent tachycardia in the 120s on POD 1. Her pain was not well controlled on PCA. An EKG and cardiac enzymes were ordered to rule out MI. CBC was checked to rule out bleeding. She was given a dose of toradol.  She had sinus tachycardia on EKG and troponins were normal. Her NG tube was taken out on POD 2, but was kept NPO. She was started on clear liquids on POD 3. Her PCA was discontinued on POD 3. She was weaned off of oxygen. She was encouraged to ambulate. By POD 5, she was tolerating a regular diet without nausea and having bowel movements. She was ambulating well without assistance. Her incision was healing well. She was discharged home on POD  5 in good condition.    CBC    Component Value Date/Time   WBC 9.2 02/08/2017 0439   RBC 3.26 (L) 02/08/2017 0439   HGB 9.8 (L) 02/08/2017 0439   HCT 30.8 (L) 02/08/2017 0439   PLT 168 02/08/2017 0439   MCV 94.5 02/08/2017 0439   MCH 30.1 02/08/2017 0439   MCHC 31.8 02/08/2017 0439   RDW 13.6 02/08/2017 0439   LYMPHSABS 2.8 06/20/2016 0916   MONOABS 0.3 06/20/2016 0916   EOSABS 0.3 06/20/2016 0916   BASOSABS 0.0 06/20/2016 0916    BMET    Component Value Date/Time   NA 132 (L) 02/09/2017 0219   K 3.5 02/09/2017 0219   CL 98 (L) 02/09/2017 0219   CO2 28 02/09/2017 0219   GLUCOSE 108 (H) 02/09/2017 0219   GLUCOSE 87 07/15/2006 1043   BUN 7 02/09/2017 0219   CREATININE 0.55 02/09/2017 0219   CALCIUM 8.3 (L) 02/09/2017 0219   GFRNONAA >60 02/09/2017 0219   GFRAA >60 02/09/2017 0219     Discharge Instructions:   The patient is discharged to home with extensive instructions on wound care and progressive ambulation. They are instructed not to drive or perform any heavy lifting until returning to see the physician in his office.  Discharge Instructions    ABDOMINAL PROCEDURE/ANEURYSM REPAIR/AORTO-BIFEMORAL BYPASS:  Call MD for increased abdominal pain; cramping diarrhea; nausea/vomiting    Complete by:  As directed  Call MD for:  redness, tenderness, or signs of infection (pain, swelling, bleeding, redness, odor or green/yellow discharge around incision site)    Complete by:  As directed    Call MD for:  severe or increased pain, loss or decreased feeling  in affected limb(s)    Complete by:  As directed    Call MD for:  temperature >100.5    Complete by:  As directed    Discharge wound care:    Complete by:  As directed    Shower daily. Wash abdominal wound daily with soap and water and pat dry. Do not scrub incision.   Driving Restrictions    Complete by:  As directed    No driving for 2 weeks and while on pain medication   Increase activity slowly    Complete by:   As directed    Stay active. Walk as tolerated.   Lifting restrictions    Complete by:  As directed    No lifting greater than 10 lbs for 8 weeks   Resume previous diet    Complete by:  As directed       Discharge Diagnosis:  Abdominal aortic aneurysm I71.4  Secondary Diagnosis: Patient Active Problem List   Diagnosis Date Noted  . AAA (abdominal aortic aneurysm) ([place]) 02/05/2017  . Preoperative clearance 12/11/2016  . Primary osteoarthritis of right knee 01/23/2016  . S/P total knee replacement using cement 01/23/2016  . Acute cystitis 08/23/2014  . Peptic ulcer associated with Helicobacter pylori infection 05/03/2014  . Acute right hip pain 04/28/2014  . Pain of left leg 04/28/2014  . Abdominal pain, epigastric 04/28/2014  . AAA (abdominal aortic aneurysm) without rupture ([place]) 03/08/2013  . Left otitis media 10/08/2012  . Unilateral occipital headache 04/20/2012  . Vaginitis 02/24/2012  . Left knee pain 11/19/2010  . SHOULDER PAIN, BILATERAL 07/17/2010  . WRIST PAIN, RIGHT 02/15/2010  . Hypothyroidism 08/01/2008  . COPD (chronic obstructive pulmonary disease) ([place]) 08/01/2008  . Backache 11/26/2007  . HYPOTHYROIDISM, POST-RADIOACTIVE IODINE 07/20/2007  . TOBACCO ABUSE 07/20/2007  . HOT FLASHES 07/20/2007  . BENIGN POSITIONAL VERTIGO, HX OF 07/20/2007  . INFLAMMATORY DISEASE OF BREAST 07/19/2007  . FIBROCYSTIC BREAST DISEASE 04/01/2007   Past Medical History:  Diagnosis Date  . AAA (abdominal aortic aneurysm) ([place])   . Arthritis   . COPD (chronic obstructive pulmonary disease) ([place])   . Dyspnea    just recently started wheezing  . GERD (gastroesophageal reflux disease)   . Hyperlipidemia   . Hypothyroidism   . Thyroid disease   . Tobacco abuse   . Vertigo      Allergies as of 02/10/2017      Reactions   No Known Allergies       Medication List    TAKE these medications   aspirin EC 81 MG tablet Take 81 mg by mouth at bedtime.   diazepam 5 MG  tablet Commonly known as:  VALIUM TAKE 1/2 TO 1 TABLET BY MOUTH TWICE A DAY AS NEEDED FOR ANXIETY OR SLEEP   ibuprofen 200 MG tablet Commonly known as:  ADVIL,MOTRIN Take 400 mg by mouth every 8 (eight) hours as needed (for pain.).   levothyroxine 88 MCG tablet Commonly known as:  SYNTHROID, LEVOTHROID Take 1 tablet (88 mcg total) by mouth daily.   omeprazole 40 MG capsule Commonly known as:  PRILOSEC Take 40 mg by mouth daily as needed. For acid reflux/indigestion.   oxyCODONE-acetaminophen 5-325 MG tablet Commonly known as:  PERCOCET/ROXICET  Take 1-2 tablets by mouth every 6 (six) hours as needed for moderate pain.   simvastatin 40 MG tablet Commonly known as:  ZOCOR Take 1 tablet (40 mg total) by mouth at bedtime.       Percoet #20 No Refill  Disposition: Home  Patient's condition: is Good  Follow up: 1. Dr. Oneida Alar in 2-4 weeks   Virgina Jock, PA-C [facility] [phone] 02/11/2017  12:24 PM   - For VQI Registry use ---   Post-op:  Time to Extubation: [ x] In OR, [ ]  < 12 hrs, [ ]  12-24 hrs, [ ]  >=24 hrs Vasopressors Req. Post-op: No ICU Stay: 1 days Transfusion: No   MI: No, [ ]  Troponin only, [ ]  EKG or Clinical New Arrhythmia: No  Complications: CHF: No Resp failure: No, [ ]  Pneumonia, [ ]  Ventilator Chg in renal function: No, [ ]  Inc. Cr > 0.5, [ ]  Temp. Dialysis, [ ]  Permanent dialysis Leg ischemia: No, no Surgery needed, [ ]  Yes, Surgery needed, [ ]  Amputation Bowel ischemia: No, [ ]  Medical Rx, [ ]  Surgical Rx Wound complication: No, [ ]  Superficial separation/infection, [ ]  Return to OR Return to OR: No  Return to OR for bleeding: No Stroke: No, [ ]  Minor, [ ]  Major  Discharge medications: Statin use:  Yes If No: [ ]  For Medical reasons, [ ]  Non-compliant ASA use:  Yes  If No: [ ]  For Medical reasons, [ ]  Non-compliant Plavix use:  No If No: [ ]  For Medical reasons, [ ]  Non-compliant Beta blocker use:  No If No: [ ]   For Medical reasons, [ ]  Non-compliant

## 2017-02-13 ENCOUNTER — Other Ambulatory Visit: Payer: Self-pay

## 2017-02-13 DIAGNOSIS — Z48812 Encounter for surgical aftercare following surgery on the circulatory system: Secondary | ICD-10-CM | POA: Diagnosis not present

## 2017-02-13 DIAGNOSIS — F17210 Nicotine dependence, cigarettes, uncomplicated: Secondary | ICD-10-CM | POA: Diagnosis not present

## 2017-02-13 DIAGNOSIS — J449 Chronic obstructive pulmonary disease, unspecified: Secondary | ICD-10-CM | POA: Diagnosis not present

## 2017-02-13 MED ORDER — OXYCODONE-ACETAMINOPHEN 5-325 MG PO TABS: 1.0000 | ORAL_TABLET | Freq: Four times a day (QID) | ORAL | 0 refills | Status: DC | PRN

## 2017-02-18 DIAGNOSIS — J449 Chronic obstructive pulmonary disease, unspecified: Secondary | ICD-10-CM | POA: Diagnosis not present

## 2017-02-18 DIAGNOSIS — Z48812 Encounter for surgical aftercare following surgery on the circulatory system: Secondary | ICD-10-CM | POA: Diagnosis not present

## 2017-02-18 DIAGNOSIS — F17210 Nicotine dependence, cigarettes, uncomplicated: Secondary | ICD-10-CM | POA: Diagnosis not present

## 2017-02-20 DIAGNOSIS — J449 Chronic obstructive pulmonary disease, unspecified: Secondary | ICD-10-CM | POA: Diagnosis not present

## 2017-02-20 DIAGNOSIS — Z48812 Encounter for surgical aftercare following surgery on the circulatory system: Secondary | ICD-10-CM | POA: Diagnosis not present

## 2017-02-20 DIAGNOSIS — F17210 Nicotine dependence, cigarettes, uncomplicated: Secondary | ICD-10-CM | POA: Diagnosis not present

## 2017-02-26 DIAGNOSIS — Z48812 Encounter for surgical aftercare following surgery on the circulatory system: Secondary | ICD-10-CM | POA: Diagnosis not present

## 2017-02-26 DIAGNOSIS — J449 Chronic obstructive pulmonary disease, unspecified: Secondary | ICD-10-CM | POA: Diagnosis not present

## 2017-02-26 DIAGNOSIS — F17210 Nicotine dependence, cigarettes, uncomplicated: Secondary | ICD-10-CM | POA: Diagnosis not present

## 2017-02-27 ENCOUNTER — Encounter: Payer: Self-pay | Admitting: Vascular Surgery

## 2017-02-28 DIAGNOSIS — Z48812 Encounter for surgical aftercare following surgery on the circulatory system: Secondary | ICD-10-CM | POA: Diagnosis not present

## 2017-02-28 DIAGNOSIS — F17210 Nicotine dependence, cigarettes, uncomplicated: Secondary | ICD-10-CM | POA: Diagnosis not present

## 2017-02-28 DIAGNOSIS — J449 Chronic obstructive pulmonary disease, unspecified: Secondary | ICD-10-CM | POA: Diagnosis not present

## 2017-03-04 DIAGNOSIS — J449 Chronic obstructive pulmonary disease, unspecified: Secondary | ICD-10-CM | POA: Diagnosis not present

## 2017-03-04 DIAGNOSIS — F17210 Nicotine dependence, cigarettes, uncomplicated: Secondary | ICD-10-CM | POA: Diagnosis not present

## 2017-03-04 DIAGNOSIS — Z48812 Encounter for surgical aftercare following surgery on the circulatory system: Secondary | ICD-10-CM | POA: Diagnosis not present

## 2017-03-05 DIAGNOSIS — F17210 Nicotine dependence, cigarettes, uncomplicated: Secondary | ICD-10-CM | POA: Diagnosis not present

## 2017-03-05 DIAGNOSIS — Z48812 Encounter for surgical aftercare following surgery on the circulatory system: Secondary | ICD-10-CM | POA: Diagnosis not present

## 2017-03-05 DIAGNOSIS — J449 Chronic obstructive pulmonary disease, unspecified: Secondary | ICD-10-CM | POA: Diagnosis not present

## 2017-03-08 NOTE — Progress Notes (Signed)
She was a no show

## 2017-03-13 ENCOUNTER — Ambulatory Visit (INDEPENDENT_AMBULATORY_CARE_PROVIDER_SITE_OTHER): Payer: Self-pay | Admitting: Vascular Surgery

## 2017-03-13 ENCOUNTER — Encounter: Payer: Self-pay | Admitting: Vascular Surgery

## 2017-03-13 VITALS — BP 112/73 | HR 79 | Temp 97.2°F | Resp 16 | Ht 66.0 in | Wt 134.0 lb

## 2017-03-13 DIAGNOSIS — I714 Abdominal aortic aneurysm, without rupture, unspecified: Secondary | ICD-10-CM

## 2017-03-13 NOTE — Progress Notes (Signed)
  POST OPERATIVE OFFICE NOTE    CC:  F/u for surgery  HPI:  This is a 66 y.o. female who is s/p Open repair of AAA.  She has complaints of lower lumbar pain and radiation into her right LE over the past week.  She has also noticed right knee swelling.  She Had the right knee replaced by Dr. Durward Fortes 01/23/2016.  She denise N/V/D, no SOB, no weakness in her extremities.  She has stopped smoking since her surgery.  Allergies  Allergen Reactions  . No Known Allergies     Current Outpatient Prescriptions  Medication Sig Dispense Refill  . aspirin EC 81 MG tablet Take 81 mg by mouth at bedtime.     . diazepam (VALIUM) 5 MG tablet TAKE 1/2 TO 1 TABLET BY MOUTH TWICE A DAY AS NEEDED FOR ANXIETY OR SLEEP 30 tablet 0  . ibuprofen (ADVIL,MOTRIN) 200 MG tablet Take 400 mg by mouth every 8 (eight) hours as needed (for pain.).    Marland Kitchen levothyroxine (SYNTHROID, LEVOTHROID) 88 MCG tablet Take 1 tablet (88 mcg total) by mouth daily. 90 tablet 3  . omeprazole (PRILOSEC) 40 MG capsule Take 40 mg by mouth daily as needed. For acid reflux/indigestion.    Marland Kitchen oxyCODONE-acetaminophen (PERCOCET/ROXICET) 5-325 MG tablet Take 1-2 tablets by mouth every 6 (six) hours as needed for moderate pain. 30 tablet 0  . simvastatin (ZOCOR) 40 MG tablet Take 1 tablet (40 mg total) by mouth at bedtime. 90 tablet 3   No current facility-administered medications for this visit.      ROS:  See HPI  Physical Exam:  Vitals:   03/13/17 1312  BP: 112/73  Pulse: 79  Resp: 16  Temp: (!) 97.2 F (36.2 C)    Incision:  Well healed without erythema or drainage Extremities:  Palpable DP B, Active range of motion intact B LE, sensation grossly intact and equal B. Abdomen:  Soft non tender to palpation, positive BS. Heart RRR  Assessment/Plan:  This is a 66 y.o. female who is s/p: Open repair AAA.  Her main complaint today seems to be lumbar pain with radicular pain to her right knee.  Upon review of an abdominal xray she has  evidence of spondylolisthesis and DDD with lumbar spurring.    We will have her follow up with her Orthopedic physician Dr. Durward Fortes.  She will f/u with Dr. Oneida Alar in 1 year.  Activities as tolerates.  Theda Sers EMMA [facility] PA-C [facility] [phone]  History and exam findings as above. Patient underwent successful open abdominal aortic aneurysm repair several weeks ago. Her appetite has not completely returned normal but is improving. She is having normal bowel movements. She denies any nausea or vomiting. Her incision is well-healed. She has palpable pedal pulses. She has quit smoking. She now has had several weeks of chronic back pain. She has had problems with back pain in the past. She also complains of some swelling in her right knee that has been replaced in the past but Dr. Durward Fortes. Sparser aortic aneurysm is concerned she will follow-up with [place] in 1 year. We will schedule her for an appointment with Dr. Durward Fortes for further evaluation of her right knee in her back pain. I congratulated her today on quitting smoking.  Ruta Hinds, MD [facility] [place] Office: [phone] Pager: [phone]

## 2017-03-17 ENCOUNTER — Telehealth (INDEPENDENT_AMBULATORY_CARE_PROVIDER_SITE_OTHER): Payer: Self-pay | Admitting: Orthopaedic Surgery

## 2017-03-17 ENCOUNTER — Ambulatory Visit (INDEPENDENT_AMBULATORY_CARE_PROVIDER_SITE_OTHER): Payer: Medicare HMO

## 2017-03-17 ENCOUNTER — Telehealth (INDEPENDENT_AMBULATORY_CARE_PROVIDER_SITE_OTHER): Payer: Self-pay

## 2017-03-17 ENCOUNTER — Encounter (INDEPENDENT_AMBULATORY_CARE_PROVIDER_SITE_OTHER): Payer: Self-pay | Admitting: Orthopaedic Surgery

## 2017-03-17 ENCOUNTER — Other Ambulatory Visit: Payer: Self-pay | Admitting: Adult Health

## 2017-03-17 ENCOUNTER — Ambulatory Visit (INDEPENDENT_AMBULATORY_CARE_PROVIDER_SITE_OTHER): Payer: Medicare HMO | Admitting: Orthopaedic Surgery

## 2017-03-17 VITALS — BP 138/82 | HR 80 | Resp 14 | Ht 64.0 in | Wt 134.0 lb

## 2017-03-17 DIAGNOSIS — M5441 Lumbago with sciatica, right side: Secondary | ICD-10-CM

## 2017-03-17 MED ORDER — HYDROCODONE-ACETAMINOPHEN 5-325 MG PO TABS: 1.0000 | ORAL_TABLET | Freq: Three times a day (TID) | ORAL | 0 refills | Status: DC | PRN

## 2017-03-17 MED ORDER — OXYCODONE-ACETAMINOPHEN 5-325 MG PO TABS: 1.0000 | ORAL_TABLET | Freq: Three times a day (TID) | ORAL | 0 refills | Status: DC | PRN

## 2017-03-17 NOTE — Telephone Encounter (Signed)
Please advise 

## 2017-03-17 NOTE — Telephone Encounter (Signed)
Patient called stating that she is having severe pain in her right knee and it has been going on for a week to a week in a half.  Dr. Durward Fortes did her knee replacement last May.  She is wanting to get fit into Dr. Rudene Anda schedule today.  Please call patient.  CB#[phone].  Thank you.

## 2017-03-17 NOTE — Telephone Encounter (Signed)
Needs office visit.

## 2017-03-17 NOTE — Addendum Note (Signed)
Addended by: Mervyn Skeeters on: 03/17/2017 11:06 AM   Modules accepted: Orders

## 2017-03-17 NOTE — Telephone Encounter (Signed)
I called pt and she is coming in in 30 min. Please add to schedule

## 2017-03-17 NOTE — Telephone Encounter (Signed)
LVMOM for triage RN at Dr. Oneida Alar office.  PW wants pt to have a MRI lumbar. Pt had AAA surgery recently

## 2017-03-17 NOTE — Progress Notes (Signed)
Office Visit Note   Patient: Stacy Garcia           Date of Birth: [birth year]           MRN: [id number] Visit Date: 03/17/2017              Requested by: [name], NP [place], [place] [address] PCP: [name], NP   Assessment & Plan: Visit Diagnoses:  1. Acute right-sided low back pain with right-sided sciatica   68 weeks status post open abdominal aortic aneurysm repair with postop onset of low back pain and right lower extremity discomfort. Concern about status of right total knee replacement without fever or chills.  Plan: Norco for pain. MRI lumbar spine. Long discussion regarding different diagnostic possibilities. I believe the problem originates in the lumbar spine.  Follow-Up Instructions: No Follow-up on file.   Orders:  Orders Placed This Encounter  Procedures  . XR Lumbar Spine 2-3 Views  . XR Pelvis 1-2 Views   No orders of the defined types were placed in this encounter.     Procedures: No procedures performed   Clinical Data: No additional findings.   Subjective: Chief Complaint  Patient presents with  . Lower Back - Pain    Stacy Garcia is a 66 y o that presents with lright sided lower lumbar pain. Cardiac dr sent her to be checked out.  68 months status post primary right total knee replacement without complications. 68 weeks status post open abdominal aortic aneurysm repair with postop onset of low back pain associated with referred pain into her right lower extremity. There is a concern that some of her pain may be related to her right knee replacement. She's not had any fever or chills. Her knee is not unstable. She feels that it might be a little bit "swollen". No calf pain. No loss of feeling in her right foot. Walks without a limp and not using any ambulatory aid. Pain seems to be worse starting in her back radiating to her buttock and her entire right leg the longer she is on her feet. She notes that she "can't live  like this".  HPI  Review of Systems   Objective: Vital Signs: BP 138/82   Pulse 80   Resp 14   Ht 5\' 4"  (1.626 m)   Wt 134 lb (60.8 kg)   BMI 23.00 kg/m   Physical Exam  Ortho Exam straight leg raise positive on the right for low back and buttock pain. Negative on the left. Total knee replacement without effusion. Full extension quickly. Flexed the 120 with a goniometer. No instability. No localized areas of tenderness. Pain. Good pulses distally. Mild percussible tenderness of lumbar spine  Specialty Comments:  No specialty comments available.  Imaging: No results found.   PMFS History: Patient Active Problem List   Diagnosis Date Noted  . AAA (abdominal aortic aneurysm) ([place]) 02/05/2017  . Preoperative clearance 12/11/2016  . Primary osteoarthritis of right knee 01/23/2016  . S/P total knee replacement using cement 01/23/2016  . Acute cystitis 08/23/2014  . Peptic ulcer associated with Helicobacter pylori infection 05/03/2014  . Acute right hip pain 04/28/2014  . Pain of left leg 04/28/2014  . Abdominal pain, epigastric 04/28/2014  . AAA (abdominal aortic aneurysm) without rupture ([place]) 03/08/2013  . Left otitis media 10/08/2012  . Unilateral occipital headache 04/20/2012  . Vaginitis 02/24/2012  . Left knee pain 11/19/2010  . SHOULDER PAIN, BILATERAL 07/17/2010  . WRIST  PAIN, RIGHT 02/15/2010  . Hypothyroidism 08/01/2008  . COPD (chronic obstructive pulmonary disease) ([place]) 08/01/2008  . Backache 11/26/2007  . HYPOTHYROIDISM, POST-RADIOACTIVE IODINE 07/20/2007  . TOBACCO ABUSE 07/20/2007  . HOT FLASHES 07/20/2007  . BENIGN POSITIONAL VERTIGO, HX OF 07/20/2007  . INFLAMMATORY DISEASE OF BREAST 07/19/2007  . FIBROCYSTIC BREAST DISEASE 04/01/2007   Past Medical History:  Diagnosis Date  . AAA (abdominal aortic aneurysm) ([place])   . Arthritis   . COPD (chronic obstructive pulmonary disease) ([place])   . Dyspnea    just recently started wheezing  . GERD  (gastroesophageal reflux disease)   . Hyperlipidemia   . Hypothyroidism   . Thyroid disease   . Tobacco abuse   . Vertigo     Family History  Problem Relation Age of Onset  . Thyroid disease Other   . Diabetes Other   . Hyperlipidemia Other   . Hypertension Other     Past Surgical History:  Procedure Laterality Date  . ABDOMINAL AORTIC ANEURYSM REPAIR N/A 02/05/2017   Procedure: ANEURYSM ABDOMINAL AORTIC REPAIR;  Surgeon: Elam Dutch, MD;  Location: [facility];  Service: Vascular;  Laterality: N/A;  . CESAREAN SECTION     x 3  . JOINT REPLACEMENT    . SHOULDER SURGERY  02/2015  . TOTAL KNEE ARTHROPLASTY Right 01/23/2016   Procedure: TOTAL KNEE ARTHROPLASTY;  Surgeon: Garald Balding, MD;  Location: [facility];  Service: Orthopedics;  Laterality: Right;  . TUBAL LIGATION     Social History   Occupational History  . Retired    Social History Main Topics  . Smoking status: Former Smoker    Packs/day: 1.00    Years: 50.00    Types: Cigarettes    Quit date: 12/31/2016  . Smokeless tobacco: Never Used  . Alcohol use No  . Drug use: No  . Sexual activity: Not on file     Garald Balding, MD   Note - This record has been created using [employer].  Chart creation errors have been sought, but may not always  have been located. Such creation errors do not reflect on  the standard of medical care.

## 2017-03-25 NOTE — Telephone Encounter (Signed)
Ok to refill for 30 days  

## 2017-03-25 NOTE — Telephone Encounter (Signed)
  Last OV : 10/30/16 Refill: 02/10/17 #30 with 0 refills Ok to refill? Please Advise    DIAZEPAM 5 MG TABLET TAKE 1/2 TO 1 TABLET BY MOUTH TWICE A DAY

## 2017-04-08 ENCOUNTER — Ambulatory Visit (INDEPENDENT_AMBULATORY_CARE_PROVIDER_SITE_OTHER): Payer: Medicare HMO | Admitting: Adult Health

## 2017-04-08 ENCOUNTER — Encounter: Payer: Self-pay | Admitting: Adult Health

## 2017-04-08 VITALS — BP 140/80 | HR 81 | Temp 99.0°F | Wt 136.0 lb

## 2017-04-08 DIAGNOSIS — J069 Acute upper respiratory infection, unspecified: Secondary | ICD-10-CM | POA: Diagnosis not present

## 2017-04-08 MED ORDER — HYDROCODONE-HOMATROPINE 5-1.5 MG/5ML PO SYRP: 5.0000 mL | ORAL_SOLUTION | Freq: Three times a day (TID) | ORAL | 0 refills | Status: DC | PRN

## 2017-04-08 NOTE — Progress Notes (Signed)
   Subjective:    Patient ID: Stacy Garcia, female    DOB: 29-Jun-1951, 66 y.o.   MRN: [id number]  URI   This is a recurrent problem. The current episode started in the past 7 days (3 days ). There has been no fever. Associated symptoms include coughing (non productive ), headaches, nausea, rhinorrhea and a sore throat. Pertinent negatives include no abdominal pain, chest pain, congestion, diarrhea, plugged ear sensation, sinus pain, vomiting or wheezing. She has tried antihistamine and decongestant for the symptoms. The treatment provided no relief.   Fortunately,she quit smoking 4 months ago   Review of Systems  HENT: Positive for rhinorrhea and sore throat. Negative for congestion, sinus pain and sinus pressure.   Respiratory: Positive for cough (non productive ). Negative for chest tightness, shortness of breath and wheezing.   Cardiovascular: Negative for chest pain.  Gastrointestinal: Positive for nausea. Negative for abdominal pain, diarrhea and vomiting.  Neurological: Positive for headaches.       Objective:   Physical Exam  Constitutional: She is oriented to person, place, and time. She appears well-developed and well-nourished.  HENT:  Head: Normocephalic and atraumatic.  Right Ear: Hearing, tympanic membrane, external ear and ear canal normal.  Left Ear: Hearing, tympanic membrane, external ear and ear canal normal.  Nose: Rhinorrhea present. No mucosal edema. Right sinus exhibits no maxillary sinus tenderness and no frontal sinus tenderness. Left sinus exhibits no maxillary sinus tenderness and no frontal sinus tenderness.  Mouth/Throat: Uvula is midline, oropharynx is clear and moist and mucous membranes are normal. No oropharyngeal exudate.  Eyes: Pupils are equal, round, and reactive to light. Conjunctivae and EOM are normal. Right eye exhibits no discharge. Left eye exhibits no discharge. No scleral icterus.  Cardiovascular: Normal rate, regular rhythm, normal heart sounds  and intact distal pulses.  Exam reveals no gallop and no friction rub.   No murmur heard. Pulmonary/Chest: Effort normal and breath sounds normal. No respiratory distress. She has no wheezes. She has no rales.  Neurological: She is alert and oriented to person, place, and time.  Skin: Skin is warm and dry. No rash noted. She is not diaphoretic. No erythema. No pallor.  Psychiatric: She has a normal mood and affect. Her behavior is normal. Judgment and thought content normal.  Vitals reviewed.     Assessment & Plan:  1. Upper respiratory tract infection, unspecified type - Appears viral or allergic in nature. Advised flonase. Stop OTC Nyquil. Hycodan at night.  - HYDROcodone-homatropine (HYCODAN) 5-1.5 MG/5ML syrup; Take 5 mLs by mouth every 8 (eight) hours as needed for cough.  Dispense: 120 mL; Refill: 0 - Follow up in 2-3 days if no improvement   [name], NP

## 2017-04-08 NOTE — Patient Instructions (Signed)
It was great seeing you today   Your exam is consistent with upper respiratory infection - probably viral in nature.    General Recommendations:    Please drink plenty of fluids.  Get plenty of rest   Sleep in humidified air  Use saline nasal sprays  Netti pot   OTC Medications:  Decongestants - helps relieve congestion   Flonase (generic fluticasone) or Nasacort (generic triamcinolone) - please make sure to use the "cross-over" technique at a 45 degree angle towards the opposite eye as opposed to straight up the nasal passageway.   Sudafed (generic pseudoephedrine - Note this is the one that is available behind the pharmacy counter); Products with phenylephrine (-PE) may also be used but is often not as effective as pseudoephedrine.   If you have HIGH BLOOD PRESSURE - Coricidin HBP; AVOID any product that is -D as this contains pseudoephedrine which may increase your blood pressure.  Afrin (oxymetazoline) every 6-8 hours for up to 3 days.   Allergies - helps relieve runny nose, itchy eyes and sneezing   Claritin (generic loratidine), Allegra (fexofenidine), or Zyrtec (generic cyrterizine) for runny nose. These medications should not cause drowsiness.  Note - Benadryl (generic diphenhydramine) may be used however may cause drowsiness  Cough -   Delsym or Robitussin (generic dextromethorphan)  Expectorants - helps loosen mucus to ease removal   Mucinex (generic guaifenesin) as directed on the package.  Headaches / General Aches   Tylenol (generic acetaminophen) - DO NOT EXCEED 3 grams (3,000 mg) in a 24 hour time period  Advil/Motrin (generic ibuprofen)   Sore Throat -   Salt water gargle   Chloraseptic (generic benzocaine) spray or lozenges / Sucrets (generic dyclonine)    Sinusitis Sinusitis is redness, soreness, and inflammation of the paranasal sinuses. Paranasal sinuses are air pockets within the bones of your face (beneath the eyes, the middle of  the forehead, or above the eyes). In healthy paranasal sinuses, mucus is able to drain out, and air is able to circulate through them by way of your nose. However, when your paranasal sinuses are inflamed, mucus and air can become trapped. This can allow bacteria and other germs to grow and cause infection. Sinusitis can develop quickly and last only a short time (acute) or continue over a long period (chronic). Sinusitis that lasts for more than 12 weeks is considered chronic.  CAUSES  Causes of sinusitis include:  Allergies.  Structural abnormalities, such as displacement of the cartilage that separates your nostrils (deviated septum), which can decrease the air flow through your nose and sinuses and affect sinus drainage.  Functional abnormalities, such as when the small hairs (cilia) that line your sinuses and help remove mucus do not work properly or are not present. SIGNS AND SYMPTOMS  Symptoms of acute and chronic sinusitis are the same. The primary symptoms are pain and pressure around the affected sinuses. Other symptoms include:  Upper toothache.  Earache.  Headache.  Bad breath.  Decreased sense of smell and taste.  A cough, which worsens when you are lying flat.  Fatigue.  Fever.  Thick drainage from your nose, which often is green and may contain pus (purulent).  Swelling and warmth over the affected sinuses. DIAGNOSIS  Your health care provider will perform a physical exam. During the exam, your health care provider may:  Look in your nose for signs of abnormal growths in your nostrils (nasal polyps).  Tap over the affected sinus to check for signs of  infection.  View the inside of your sinuses (endoscopy) using an imaging device that has a light attached (endoscope). If your health care provider suspects that you have chronic sinusitis, one or more of the following tests may be recommended:  Allergy tests.  Nasal culture. A sample of mucus is taken from your  nose, sent to a lab, and screened for bacteria.  Nasal cytology. A sample of mucus is taken from your nose and examined by your health care provider to determine if your sinusitis is related to an allergy. TREATMENT  Most cases of acute sinusitis are related to a viral infection and will resolve on their own within 10 days. Sometimes medicines are prescribed to help relieve symptoms (pain medicine, decongestants, nasal steroid sprays, or saline sprays).  However, for sinusitis related to a bacterial infection, your health care provider will prescribe antibiotic medicines. These are medicines that will help kill the bacteria causing the infection.  Rarely, sinusitis is caused by a fungal infection. In theses cases, your health care provider will prescribe antifungal medicine. For some cases of chronic sinusitis, surgery is needed. Generally, these are cases in which sinusitis recurs more than 3 times per year, despite other treatments. HOME CARE INSTRUCTIONS   Drink plenty of water. Water helps thin the mucus so your sinuses can drain more easily.  Use a humidifier.  Inhale steam 3 to 4 times a day (for example, sit in the bathroom with the shower running).  Apply a warm, moist washcloth to your face 3 to 4 times a day, or as directed by your health care provider.  Use saline nasal sprays to help moisten and clean your sinuses.  Take medicines only as directed by your health care provider.  If you were prescribed either an antibiotic or antifungal medicine, finish it all even if you start to feel better. SEEK IMMEDIATE MEDICAL CARE IF:  You have increasing pain or severe headaches.  You have nausea, vomiting, or drowsiness.  You have swelling around your face.  You have vision problems.  You have a stiff neck.  You have difficulty breathing. MAKE SURE YOU:   Understand these instructions.  Will watch your condition.  Will get help right away if you are not doing well or get  worse. Document Released: 08/26/2005 Document Revised: 01/10/2014 Document Reviewed: 09/10/2011 [facility] Patient Information 2015 [place], [place]. This information is not intended to replace advice given to you by your health care provider. Make sure you discuss any questions you have with your health care provider.

## 2017-04-14 ENCOUNTER — Telehealth: Payer: Self-pay | Admitting: *Deleted

## 2017-04-14 NOTE — Telephone Encounter (Signed)
Patient called to report a vague soreness in her left side "about 6 inches from her incision" for the last 2 days.  Dr. Oneida Alar did an open AAA repair on 01-09-17. She was seen for her postop appt on 03/13/17 and her only complaint at that time was lumbar pain radiating into her RLE. She has seen Dr. Durward Fortes for this pain.  She reports that this LUQ soreness is mainly when she moves around and improves when she rests or lies in bed. She denies any injuries or trauma to the area. She is afebrile and is having no pain in either leg; also no skin discoloration or swelling noted in her feet. Patient states that she feels no knots, swelling, lumps or masses in the area of the soreness. She is having normal BMs (using stool softener) and no S&S of UTI per patient. She has been taking Ibuprofen today.  I have advised her to contact her PCP for an evaluation of this soreness.  Patient voiced understanding and agreement of this plan.

## 2017-04-16 ENCOUNTER — Encounter: Payer: Self-pay | Admitting: Adult Health

## 2017-04-16 ENCOUNTER — Ambulatory Visit (INDEPENDENT_AMBULATORY_CARE_PROVIDER_SITE_OTHER): Payer: Medicare HMO | Admitting: Adult Health

## 2017-04-16 VITALS — BP 130/66 | HR 73 | Temp 97.7°F | Ht 64.0 in | Wt 137.0 lb

## 2017-04-16 DIAGNOSIS — R109 Unspecified abdominal pain: Secondary | ICD-10-CM | POA: Diagnosis not present

## 2017-04-16 DIAGNOSIS — J069 Acute upper respiratory infection, unspecified: Secondary | ICD-10-CM | POA: Diagnosis not present

## 2017-04-16 MED ORDER — PREDNISONE 10 MG PO TABS: ORAL_TABLET | ORAL | 0 refills | Status: DC

## 2017-04-16 MED ORDER — DOXYCYCLINE HYCLATE 100 MG PO CAPS: 100.0000 mg | ORAL_CAPSULE | Freq: Two times a day (BID) | ORAL | 0 refills | Status: DC

## 2017-04-16 MED ORDER — BACLOFEN 5 MG PO TABS: 5.0000 mg | ORAL_TABLET | Freq: Every day | ORAL | 0 refills | Status: DC

## 2017-04-16 NOTE — Progress Notes (Signed)
Subjective:    Patient ID: Stacy Garcia, female    DOB: 1951/04/27, 66 y.o.   MRN: [id number]  HPI  66 year old female who  has a past medical history of AAA (abdominal aortic aneurysm) ([place]); Arthritis; COPD (chronic obstructive pulmonary disease) ([place]); Dyspnea; GERD (gastroesophageal reflux disease); Hyperlipidemia; Hypothyroidism; Thyroid disease; Tobacco abuse; and Vertigo. She presents to the office today for follow up of URI, I last saw her on 04/08/2017 for her symptoms and diagnosed her with probable viral or allerigicURI. She was prescribed Hycodan cough syrup and asked to use Flonase.   Today in the office she reports that her symptoms have not improved, she continues to have a dry cough but over the last 5 days she has developed a " pulling pain" in her left quadrant. This pain is worse when moving and coughing. When she has the pain it caused her to be SOB.   She denies any SOB with rest, CP, palpitations, or tachycardia   She has not noticed any fevers.   She has an open AAA repair on 01/09/2017. She denies any discoloration of the skin at the incision site.   Review of Systems See HPI   Past Medical History:  Diagnosis Date  . AAA (abdominal aortic aneurysm) ([place])   . Arthritis   . COPD (chronic obstructive pulmonary disease) ([place])   . Dyspnea    just recently started wheezing  . GERD (gastroesophageal reflux disease)   . Hyperlipidemia   . Hypothyroidism   . Thyroid disease   . Tobacco abuse   . Vertigo     Social History   Social History  . Marital status: Married    Spouse name: N/A  . Number of children: N/A  . Years of education: N/A   Occupational History  . Retired    Social History Main Topics  . Smoking status: Former Smoker    Packs/day: 1.00    Years: 50.00    Types: Cigarettes    Quit date: 12/31/2016  . Smokeless tobacco: Never Used  . Alcohol use No  . Drug use: No  . Sexual activity: Not on file   Other Topics Concern  . Not on  file   Social History Narrative  . No narrative on file    Past Surgical History:  Procedure Laterality Date  . ABDOMINAL AORTIC ANEURYSM REPAIR N/A 02/05/2017   Procedure: ANEURYSM ABDOMINAL AORTIC REPAIR;  Surgeon: Elam Dutch, MD;  Location: [facility] OR;  Service: Vascular;  Laterality: N/A;  . CESAREAN SECTION     x 3  . JOINT REPLACEMENT    . SHOULDER SURGERY  02/2015  . TOTAL KNEE ARTHROPLASTY Right 01/23/2016   Procedure: TOTAL KNEE ARTHROPLASTY;  Surgeon: Garald Balding, MD;  Location: [place];  Service: Orthopedics;  Laterality: Right;  . TUBAL LIGATION      Family History  Problem Relation Age of Onset  . Thyroid disease Other   . Diabetes Other   . Hyperlipidemia Other   . Hypertension Other     Allergies  Allergen Reactions  . No Known Allergies     Current Outpatient Prescriptions on File Prior to Visit  Medication Sig Dispense Refill  . aspirin EC 81 MG tablet Take 81 mg by mouth at bedtime.     . diazepam (VALIUM) 5 MG tablet TAKE 1/2 TO 1 TABLET BY MOUTH TWICE A DAY 30 tablet 0  . HYDROcodone-acetaminophen (NORCO/VICODIN) 5-325 MG tablet Take 1-2 tablets by mouth  every 8 (eight) hours as needed for moderate pain. 30 tablet 0  . ibuprofen (ADVIL,MOTRIN) 200 MG tablet Take 400 mg by mouth every 8 (eight) hours as needed (for pain.).    Marland Kitchen levothyroxine (SYNTHROID, LEVOTHROID) 88 MCG tablet Take 1 tablet (88 mcg total) by mouth daily. 90 tablet 3  . omeprazole (PRILOSEC) 40 MG capsule Take 40 mg by mouth daily as needed. For acid reflux/indigestion.    . simvastatin (ZOCOR) 40 MG tablet Take 1 tablet (40 mg total) by mouth at bedtime. 90 tablet 3  . HYDROcodone-homatropine (HYCODAN) 5-1.5 MG/5ML syrup Take 5 mLs by mouth every 8 (eight) hours as needed for cough. (Patient not taking: Reported on 04/16/2017) 120 mL 0   No current facility-administered medications on file prior to visit.     BP 130/66 (BP Location: Left Arm)   Pulse 73   Temp 97.7 F (36.5 C)  (Oral)   Ht 5\' 4"  (1.626 m)   Wt 137 lb (62.1 kg)   SpO2 98%   BMI 23.52 kg/m       Objective:   Physical Exam  Constitutional: She appears well-developed and well-nourished. No distress.  Cardiovascular: Normal rate, regular rhythm, normal heart sounds and intact distal pulses.  Exam reveals no gallop and no friction rub.   No murmur heard. Pulmonary/Chest: Effort normal and breath sounds normal. No respiratory distress. She has no wheezes. She has no rales. She exhibits no tenderness.  Abdominal: Soft. Bowel sounds are normal. She exhibits no distension and no mass. There is no tenderness. There is no rebound and no guarding.  Neurological: She is alert.  Skin: Skin is warm and dry. No rash noted. She is not diaphoretic. No erythema. No pallor.  Well healed incision on abdomen   Psychiatric: She has a normal mood and affect. Her behavior is normal. Judgment and thought content normal.  Vitals reviewed.     Assessment & Plan:  1. Upper respiratory tract infection, unspecified type - Will treat due to symptoms.  - predniSONE (DELTASONE) 10 MG tablet; 40 mg x 3 days, 20 mg x 3 days, 10 mg x 3 days  Dispense: 21 tablet; Refill: 0 - doxycycline (VIBRAMYCIN) 100 MG capsule; Take 1 capsule (100 mg total) by mouth 2 (two) times daily.  Dispense: 14 capsule; Refill: 0 - Follow up if no improvement in 2-3 days. Consider chest x ray at that time or sooner if she develops a fever   2. Left flank pain - Appears to be strain injury or possible Costochondritis. No concern for Pneumonia or PE.  - Baclofen 5 MG TABS; Take 5 mg by mouth at bedtime.  Dispense: 5 tablet; Refill: 0   [name], NP

## 2017-04-18 ENCOUNTER — Telehealth: Payer: Self-pay

## 2017-04-18 ENCOUNTER — Telehealth: Payer: Self-pay | Admitting: Family Medicine

## 2017-04-18 MED ORDER — CYCLOBENZAPRINE HCL 10 MG PO TABS: 10.0000 mg | ORAL_TABLET | Freq: Three times a day (TID) | ORAL | 0 refills | Status: DC | PRN

## 2017-04-18 NOTE — Telephone Encounter (Signed)
Received a fax from the pharmacy.  Baclofen is back ordered with no release date.  Pharmacy requesting different medication for the pt.  Please advise.  Thanks!!!

## 2017-04-18 NOTE — Telephone Encounter (Signed)
Flexeril 10 mg QHS 15 pills, 0 refills

## 2017-04-18 NOTE — Telephone Encounter (Signed)
Received PA request for Cyclobenzaprine. PA submitted & pending. Key: [id number]

## 2017-04-18 NOTE — Telephone Encounter (Signed)
Called and notified pt of change in medication.  Sent to the pharmacy by e-scribe.

## 2017-04-20 ENCOUNTER — Other Ambulatory Visit: Payer: Self-pay | Admitting: Family Medicine

## 2017-05-21 ENCOUNTER — Other Ambulatory Visit: Payer: Self-pay | Admitting: Pediatrics

## 2017-05-21 DIAGNOSIS — E785 Hyperlipidemia, unspecified: Secondary | ICD-10-CM

## 2017-05-21 MED ORDER — SIMVASTATIN 40 MG PO TABS: 40.0000 mg | ORAL_TABLET | Freq: Every day | ORAL | 3 refills | Status: DC

## 2017-05-21 NOTE — Telephone Encounter (Signed)
Last OV/refill 06/20/16, next CPE 06/20/17. OK to fill?

## 2017-05-21 NOTE — Telephone Encounter (Signed)
Ok to refill for one year  

## 2017-05-22 ENCOUNTER — Other Ambulatory Visit: Payer: Self-pay | Admitting: Pediatrics

## 2017-05-26 ENCOUNTER — Other Ambulatory Visit: Payer: Self-pay | Admitting: Adult Health

## 2017-05-27 NOTE — Telephone Encounter (Signed)
Last filled on 03/25/17 for #30 Seen acutely on 04/16/17 Scheduled for cpx on 06/20/17 Please advise.  Thanks!!

## 2017-05-27 NOTE — Telephone Encounter (Signed)
Called to the pharmacy and left on machine. 

## 2017-05-27 NOTE — Telephone Encounter (Signed)
Ok to refill for three months 

## 2017-06-19 ENCOUNTER — Ambulatory Visit (INDEPENDENT_AMBULATORY_CARE_PROVIDER_SITE_OTHER): Payer: Medicare HMO | Admitting: Adult Health

## 2017-06-19 ENCOUNTER — Encounter: Payer: Self-pay | Admitting: Adult Health

## 2017-06-19 VITALS — BP 134/80 | Temp 97.5°F | Ht 65.0 in | Wt 142.0 lb

## 2017-06-19 DIAGNOSIS — F17200 Nicotine dependence, unspecified, uncomplicated: Secondary | ICD-10-CM

## 2017-06-19 DIAGNOSIS — Z23 Encounter for immunization: Secondary | ICD-10-CM

## 2017-06-19 DIAGNOSIS — Z0000 Encounter for general adult medical examination without abnormal findings: Secondary | ICD-10-CM

## 2017-06-19 DIAGNOSIS — J449 Chronic obstructive pulmonary disease, unspecified: Secondary | ICD-10-CM | POA: Diagnosis not present

## 2017-06-19 DIAGNOSIS — E018 Other iodine-deficiency related thyroid disorders and allied conditions: Secondary | ICD-10-CM | POA: Diagnosis not present

## 2017-06-19 DIAGNOSIS — Z1159 Encounter for screening for other viral diseases: Secondary | ICD-10-CM | POA: Diagnosis not present

## 2017-06-19 LAB — CBC WITH DIFFERENTIAL/PLATELET
BASOS PCT: 0.3 % (ref 0.0–3.0)
Basophils Absolute: 0 10*3/uL (ref 0.0–0.1)
EOS PCT: 3.7 % (ref 0.0–5.0)
Eosinophils Absolute: 0.2 10*3/uL (ref 0.0–0.7)
HEMATOCRIT: 47.5 % — AB (ref 36.0–46.0)
HEMOGLOBIN: 15.6 g/dL — AB (ref 12.0–15.0)
LYMPHS PCT: 43.9 % (ref 12.0–46.0)
Lymphs Abs: 2.6 10*3/uL (ref 0.7–4.0)
MCHC: 32.9 g/dL (ref 30.0–36.0)
MCV: 90.7 fl (ref 78.0–100.0)
MONO ABS: 0.4 10*3/uL (ref 0.1–1.0)
Monocytes Relative: 6.4 % (ref 3.0–12.0)
Neutro Abs: 2.7 10*3/uL (ref 1.4–7.7)
Neutrophils Relative %: 45.7 % (ref 43.0–77.0)
Platelets: 240 10*3/uL (ref 150.0–400.0)
RBC: 5.24 Mil/uL — ABNORMAL HIGH (ref 3.87–5.11)
RDW: 14.8 % (ref 11.5–15.5)
WBC: 5.9 10*3/uL (ref 4.0–10.5)

## 2017-06-19 LAB — LIPID PANEL
CHOLESTEROL: 199 mg/dL (ref 0–200)
HDL: 53.5 mg/dL (ref >39.00)
LDL Cholesterol: 121 mg/dL — ABNORMAL HIGH (ref 0–99)
NonHDL: 145.65
TRIGLYCERIDES: 125 mg/dL (ref 0.0–149.0)
Total CHOL/HDL Ratio: 4
VLDL: 25 mg/dL (ref 0.0–40.0)

## 2017-06-19 LAB — BASIC METABOLIC PANEL
BUN: 13 mg/dL (ref 6–23)
CHLORIDE: 103 meq/L (ref 96–112)
CO2: 31 mEq/L (ref 19–32)
Calcium: 9.3 mg/dL (ref 8.4–10.5)
Creatinine, Ser: 0.9 mg/dL (ref 0.40–1.20)
GFR: 66.04 mL/min (ref >60.00)
Glucose, Bld: 100 mg/dL — ABNORMAL HIGH (ref 70–99)
POTASSIUM: 4 meq/L (ref 3.5–5.1)
Sodium: 142 mEq/L (ref 135–145)

## 2017-06-19 LAB — HEPATIC FUNCTION PANEL
ALT: 10 U/L (ref 0–35)
AST: 11 U/L (ref 0–37)
Albumin: 4.3 g/dL (ref 3.5–5.2)
Alkaline Phosphatase: 90 U/L (ref 39–117)
Bilirubin, Direct: 0.1 mg/dL (ref 0.0–0.3)
TOTAL PROTEIN: 6.8 g/dL (ref 6.0–8.3)
Total Bilirubin: 0.6 mg/dL (ref 0.2–1.2)

## 2017-06-19 LAB — TSH: TSH: 1.48 u[IU]/mL (ref 0.35–4.50)

## 2017-06-19 NOTE — Patient Instructions (Signed)
It was great seeing you today   I will follow up with you regarding your blood work.   Someone from pulmonary will call you to schedule your exam

## 2017-06-19 NOTE — Progress Notes (Signed)
Subjective:    Patient ID: Stacy Garcia, female    DOB: Feb 02, 1951, 66 y.o.   MRN: [id number]  HPI  Patient presents for yearly preventative medicine examination. She is a pleasant 66 year old female who  has a past medical history of AAA (abdominal aortic aneurysm) ([place]); Arthritis; COPD (chronic obstructive pulmonary disease) ([place]); Dyspnea; GERD (gastroesophageal reflux disease); Hyperlipidemia; Hypothyroidism; Thyroid disease; Tobacco abuse; and Vertigo.  She takes Synthroid 88 mcg for hypothyroidism   Due to history of hyperlipidemia she takes Simvastatin 40 mg and ASA 81 mg daily.   Omeprazole for GERD   She takes Valium as needed for vertigo   All immunizations and health maintenance protocols were reviewed with the patient and needed orders were placed. She is due for influenza and Prevnar 13.   Appropriate screening laboratory values were ordered for the patient including screening of hyperlipidemia, renal function and hepatic function. Medication reconciliation,  past medical history, social history, problem list and allergies were reviewed in detail with the patient  Goals were established with regard to weight loss, exercise, and  diet in compliance with medications. She has started riding a stationary bike   End of life planning was discussed. She has an advanced directive   She is due for a colonoscopy but refused to have another one done despite knowing her risks ( her last was in 2006 and showed multiple polyps. It was recommended she have a colonoscopy every 3 years). She is up to date on mammogram. She refuses to get abone density   She has been seen by Dr. Oneida [facility] for AAA, she has this repaired this year. Other interval history includes that of a right total knee.   She reports worsening shortness of breath. She does have a history of COPD. She would like to see pulmonary    Review of Systems  Constitutional: Negative.   HENT: Negative.     Eyes: Negative.   Respiratory: Positive for shortness of breath and wheezing.   Cardiovascular: Negative.   Gastrointestinal: Negative.   Endocrine: Negative.   Genitourinary: Negative.   Musculoskeletal: Positive for arthralgias and back pain.  Skin: Negative.   Allergic/Immunologic: Negative.   Neurological: Negative.   Hematological: Negative.   Psychiatric/Behavioral: Negative.    Past Medical History:  Diagnosis Date  . AAA (abdominal aortic aneurysm) ([place])   . Arthritis   . COPD (chronic obstructive pulmonary disease) ([place])   . Dyspnea    just recently started wheezing  . GERD (gastroesophageal reflux disease)   . Hyperlipidemia   . Hypothyroidism   . Thyroid disease   . Tobacco abuse   . Vertigo     Social History   Social History  . Marital status: Married    Spouse name: N/A  . Number of children: N/A  . Years of education: N/A   Occupational History  . Retired    Social History Main Topics  . Smoking status: Former Smoker    Packs/day: 1.00    Years: 50.00    Types: Cigarettes    Quit date: 12/31/2016  . Smokeless tobacco: Never Used  . Alcohol use No  . Drug use: No  . Sexual activity: Not on file   Other Topics Concern  . Not on file   Social History Narrative  . No narrative on file    Past Surgical History:  Procedure Laterality Date  . ABDOMINAL AORTIC ANEURYSM REPAIR N/A 02/05/2017   Procedure: ANEURYSM ABDOMINAL  AORTIC REPAIR;  Surgeon: Elam Dutch, MD;  Location: [facility] OR;  Service: Vascular;  Laterality: N/A;  . CESAREAN SECTION     x 3  . JOINT REPLACEMENT    . SHOULDER SURGERY  02/2015  . TOTAL KNEE ARTHROPLASTY Right 01/23/2016   Procedure: TOTAL KNEE ARTHROPLASTY;  Surgeon: Garald Balding, MD;  Location: [place];  Service: Orthopedics;  Laterality: Right;  . TUBAL LIGATION      Family History  Problem Relation Age of Onset  . Thyroid disease Other   . Diabetes Other   . Hyperlipidemia Other   . Hypertension Other      Allergies  Allergen Reactions  . No Known Allergies     Current Outpatient Prescriptions on File Prior to Visit  Medication Sig Dispense Refill  . aspirin EC 81 MG tablet Take 81 mg by mouth at bedtime.     Marland Kitchen levothyroxine (SYNTHROID, LEVOTHROID) 88 MCG tablet Take 1 tablet (88 mcg total) by mouth daily. 90 tablet 3  . omeprazole (PRILOSEC) 40 MG capsule TAKE 1 CAPSULE (40 MG TOTAL) BY MOUTH DAILY. 90 capsule 0  . simvastatin (ZOCOR) 40 MG tablet Take 1 tablet (40 mg total) by mouth at bedtime. 90 tablet 3  . diazepam (VALIUM) 5 MG tablet TALE 1/2 TO 1 TABLET BY MOUTH TWICE A DAY (Patient not taking: Reported on 06/19/2017) 30 tablet 2   No current facility-administered medications on file prior to visit.     BP 134/80 (BP Location: Left Arm)   Temp (!) 97.5 F (36.4 C) (Oral)   Ht 5\' 5"  (1.651 m)   Wt 142 lb (64.4 kg)   BMI 23.63 kg/m       Objective:   Physical Exam  Constitutional: She is oriented to person, place, and time. She appears well-developed and well-nourished. No distress.  HENT:  Head: Normocephalic and atraumatic.  Right Ear: External ear normal.  Left Ear: External ear normal.  Nose: Nose normal.  Mouth/Throat: Oropharynx is clear and moist. She has dentures. No oropharyngeal exudate.  Eyes: Pupils are equal, round, and reactive to light. Conjunctivae and EOM are normal. Right eye exhibits no discharge. Left eye exhibits no discharge. No scleral icterus.  Neck: Normal range of motion. Neck supple. No JVD present. No tracheal deviation present. No thyromegaly present.  Cardiovascular: Normal rate, regular rhythm, normal heart sounds and intact distal pulses.  Exam reveals no gallop and no friction rub.   No murmur heard. Pulmonary/Chest: Effort normal. No stridor. No respiratory distress. She has wheezes. She has no rales. She exhibits no tenderness.  Abdominal: Soft. Bowel sounds are normal. She exhibits no distension and no mass. There is no tenderness.  There is no rebound and no guarding.  Musculoskeletal: Normal range of motion. She exhibits no edema, tenderness or deformity.  Lymphadenopathy:    She has no cervical adenopathy.  Neurological: She is alert and oriented to person, place, and time. She has normal reflexes. She displays normal reflexes. No cranial nerve deficit. She exhibits normal muscle tone. Coordination normal.  Skin: Skin is warm and dry. No rash noted. She is not diaphoretic. No erythema. No pallor.  Psychiatric: She has a normal mood and affect. Her behavior is normal. Judgment and thought content normal.  Nursing note and vitals reviewed.     Assessment & Plan:  1. Routine general medical examination at a health care facility -  - Basic metabolic panel - CBC with Differential/Platelet - Hepatic function panel - Lipid  panel - TSH  2. TOBACCO ABUSE - She continues to be smoke free, she was congratulated on this   3. HYPOTHYROIDISM, POST-RADIOACTIVE IODINE - Consider dose change  - Basic metabolic panel - CBC with Differential/Platelet - Hepatic function panel - Lipid panel - TSH  4. Need for hepatitis C screening test  - Hep C Antibody  5. Chronic obstructive pulmonary disease, unspecified COPD type ([place])  - Ambulatory referral to Pulmonology   [name], NP

## 2017-06-20 ENCOUNTER — Encounter: Payer: Commercial Managed Care - HMO | Admitting: Adult Health

## 2017-06-20 LAB — HEPATITIS C ANTIBODY
Hepatitis C Ab: NONREACTIVE
SIGNAL TO CUT-OFF: 0.01 (ref <1.00)

## 2017-07-02 ENCOUNTER — Other Ambulatory Visit: Payer: Self-pay | Admitting: *Deleted

## 2017-07-02 DIAGNOSIS — Z8679 Personal history of other diseases of the circulatory system: Secondary | ICD-10-CM

## 2017-07-02 DIAGNOSIS — Z9889 Other specified postprocedural states: Principal | ICD-10-CM

## 2017-07-06 ENCOUNTER — Other Ambulatory Visit: Payer: Self-pay | Admitting: Adult Health

## 2017-07-09 NOTE — Telephone Encounter (Signed)
Sent to the pharmacy by e-scribe. 

## 2017-07-22 ENCOUNTER — Other Ambulatory Visit: Payer: Self-pay | Admitting: Adult Health

## 2017-07-22 NOTE — Telephone Encounter (Signed)
Sent to the pharmacy by e-scribe. 

## 2017-07-28 ENCOUNTER — Encounter: Payer: Self-pay | Admitting: Pulmonary Disease

## 2017-07-28 ENCOUNTER — Ambulatory Visit: Payer: Medicare HMO | Admitting: Pulmonary Disease

## 2017-07-28 VITALS — BP 112/84 | HR 94 | Ht 65.0 in | Wt 146.0 lb

## 2017-07-28 DIAGNOSIS — J432 Centrilobular emphysema: Secondary | ICD-10-CM

## 2017-07-28 MED ORDER — GLYCOPYRROLATE-FORMOTEROL 9-4.8 MCG/ACT IN AERO: 2.0000 | INHALATION_SPRAY | Freq: Two times a day (BID) | RESPIRATORY_TRACT | 1 refills | Status: DC

## 2017-07-28 MED ORDER — GLYCOPYRROLATE-FORMOTEROL 9-4.8 MCG/ACT IN AERO: 2.0000 | INHALATION_SPRAY | Freq: Two times a day (BID) | RESPIRATORY_TRACT | 0 refills | Status: DC

## 2017-07-28 NOTE — Patient Instructions (Signed)
bevespi twice per day  Follow up in 6 months

## 2017-07-28 NOTE — Progress Notes (Signed)
Current Outpatient Medications on File Prior to Visit  Medication Sig  . aspirin EC 81 MG tablet Take 81 mg by mouth at bedtime.   . diazepam (VALIUM) 5 MG tablet TALE 1/2 TO 1 TABLET BY MOUTH TWICE A DAY  . levothyroxine (SYNTHROID, LEVOTHROID) 88 MCG tablet Take 1 tablet (88 mcg total) by mouth daily.  Marland Kitchen levothyroxine (SYNTHROID, LEVOTHROID) 88 MCG tablet TAKE 1 TABLET BY MOUTH EVERY DAY  . omeprazole (PRILOSEC) 40 MG capsule TAKE 1 CAPSULE BY MOUTH EVERY DAY  . simvastatin (ZOCOR) 40 MG tablet Take 1 tablet (40 mg total) by mouth at bedtime.   No current facility-administered medications on file prior to visit.      Chief Complaint  Patient presents with  . Follow-up    Pt is well overall     Pulmonary tests PFT 10/23/15 >> FEV1 1.25 (50%), FEV1% 69, TLC 4.54 (87%), DLCO 52%, no BD  Past medical history AAA, GERD, HLD, Hypothyroidism, Vertigo  Past surgical history, Family history, Social history, Allergies all reviewed.  Vital Signs BP 112/84 (BP Location: Left Arm, Cuff Size: Normal)   Pulse 94   Ht 5\' 5"  (1.651 m)   Wt 146 lb (66.2 kg)   SpO2 95%   BMI 24.30 kg/m   History of Present Illness Stacy Garcia is a 66 y.o. female former smoker with COPD/emphysema.  She was seen by Rexene Edison last April for an exacerbation.  She has been doing better since then.  She is not having cough, wheeze, sputum, or chest pain.  She keeps up with routine activity.  She is not having leg swelling or skin rash.  She quit smoking in April after her exacerbation.  Physical Exam  General - No distress ENT - No sinus tenderness, no oral exudate, no LAN Cardiac - s1s2 regular, no murmur Chest - No wheeze/rales/dullness Back - No focal tenderness Abd - Soft, non-tender Ext - No edema Neuro - Normal strength Skin - No rashes Psych - normal mood, and behavior   CMP Latest Ref Rng & Units 06/19/2017 02/09/2017 02/08/2017  Glucose 70 - 99 mg/dL 100(H) 108(H) 88  BUN 6 - 23 mg/dL 13  7 7   Creatinine 0.40 - 1.20 mg/dL 0.90 0.55 0.65  Sodium 135 - 145 mEq/L 142 132(L) 131(L)  Potassium 3.5 - 5.1 mEq/L 4.0 3.5 3.8  Chloride 96 - 112 mEq/L 103 98(L) 100(L)  CO2 19 - 32 mEq/L 31 28 24   Calcium 8.4 - 10.5 mg/dL 9.3 8.3(L) 8.1(L)  Total Protein 6.0 - 8.3 g/dL 6.8 - -  Total Bilirubin 0.2 - 1.2 mg/dL 0.6 - -  Alkaline Phos 39 - 117 U/L 90 - -  AST 0 - 37 U/L 11 - -  ALT 0 - 35 U/L 10 - -    CBC Latest Ref Rng & Units 06/19/2017 02/08/2017 02/07/2017  WBC 4.0 - 10.5 K/uL 5.9 9.2 14.1(H)  Hemoglobin 12.0 - 15.0 g/dL 15.6(H) 9.8(L) 10.2(L)  Hematocrit 36.0 - 46.0 % 47.5(H) 30.8(L) 32.3(L)  Platelets 150.0 - 400.0 K/uL 240.0 168 176    ABG    Component Value Date/Time   PHART 7.379 02/05/2017 1201   PCO2ART 37.7 02/05/2017 1201   PO2ART 215.0 (H) 02/05/2017 1201   HCO3 22.5 02/05/2017 1201   TCO2 24 02/05/2017 1201   ACIDBASEDEF 3.0 (H) 02/05/2017 1201   O2SAT 100.0 02/05/2017 1201       Assessment/Plan  COPD with emphysema. - continue bevespi - consider checking A1AT  level at next visit  Hx of tobacco abuse. - will need to discuss lung cancer screening in more detail at her next visit   Patient Instructions  bevespi twice per day  Follow up in 6 months  Time spent 27 minutes  Chesley Mires, MD [facility] Pager:  [phone] 07/28/2017, 12:45 PM

## 2017-07-29 ENCOUNTER — Other Ambulatory Visit: Payer: Self-pay | Admitting: Adult Health

## 2017-07-29 DIAGNOSIS — E785 Hyperlipidemia, unspecified: Secondary | ICD-10-CM

## 2017-07-30 NOTE — Telephone Encounter (Signed)
Sent to the pharmacy by e-scribe. 

## 2017-08-11 ENCOUNTER — Encounter: Payer: Self-pay | Admitting: Family Medicine

## 2017-08-11 ENCOUNTER — Ambulatory Visit: Payer: Medicare HMO | Admitting: Family Medicine

## 2017-08-11 VITALS — BP 138/80 | HR 87 | Temp 97.9°F | Wt 148.8 lb

## 2017-08-11 DIAGNOSIS — M545 Low back pain, unspecified: Secondary | ICD-10-CM

## 2017-08-11 LAB — POC URINALSYSI DIPSTICK (AUTOMATED)
BILIRUBIN UA: NEGATIVE
Blood, UA: NEGATIVE
Glucose, UA: NEGATIVE
KETONES UA: NEGATIVE
Leukocytes, UA: NEGATIVE
Nitrite, UA: NEGATIVE
Protein, UA: NEGATIVE
SPEC GRAV UA: 1.02 (ref 1.010–1.025)
Urobilinogen, UA: 0.2 E.U./dL
pH, UA: 6 (ref 5.0–8.0)

## 2017-08-11 MED ORDER — CYCLOBENZAPRINE HCL 10 MG PO TABS: 10.0000 mg | ORAL_TABLET | Freq: Every day | ORAL | 0 refills | Status: DC

## 2017-08-11 MED ORDER — MELOXICAM 7.5 MG PO TABS
7.5000 mg | ORAL_TABLET | Freq: Every day | ORAL | 0 refills | Status: DC
Start: 2017-08-11 — End: 2018-06-01

## 2017-08-11 NOTE — Progress Notes (Signed)
Subjective:    Patient ID: Stacy Garcia, female    DOB: 1951-04-16, 66 y.o.   MRN: [id number]  Chief Complaint  Patient presents with  . Back Pain    HPI Patient was seen today for acute concern.  Back pain times 1 week.  Low back started hurting when pt was making the bed.  Patient endorses problems with back pain in the past.  Has taken Aleeve, Advil, and Tylenol with no relief. Pt denies urinary symptoms.  Past Medical History:  Diagnosis Date  . AAA (abdominal aortic aneurysm) ([place])   . Arthritis   . COPD (chronic obstructive pulmonary disease) ([place])   . Dyspnea    just recently started wheezing  . GERD (gastroesophageal reflux disease)   . Hyperlipidemia   . Hypothyroidism   . Thyroid disease   . Tobacco abuse   . Vertigo     Allergies  Allergen Reactions  . No Known Allergies     ROS General: Denies fever, chills, night sweats, changes in weight, changes in appetite HEENT: Denies headaches, ear pain, changes in vision, rhinorrhea, sore throat CV: Denies CP, palpitations, SOB, orthopnea Pulm: Denies SOB, cough, wheezing GI: Denies abdominal pain, nausea, vomiting, diarrhea, constipation GU: Denies dysuria, hematuria, frequency, vaginal discharge Msk: Denies muscle cramps, joint pains  +back pain Neuro: Denies weakness, numbness, tingling Skin: Denies rashes, bruising Psych: Denies depression, anxiety, hallucinations     Objective:    Blood pressure 138/80, pulse 87, temperature 97.9 F (36.6 C), temperature source Oral, weight 148 lb 12.8 oz (67.5 kg).   Gen. Pleasant, well-nourished, in no distress, normal affect  HEENT: [place]/AT, face symmetric, no scleral icterus, PERRLA, EOMI, nares patent without drainage Lungs: no accessory muscle use, CTAB, no wheezes or rales Cardiovascular: RRR, no m/r/g, no peripheral edema Musculoskeletal: No deformities, no cyanosis or clubbing, normal tone.  TTP of midline lumbar spine and paraspinal muscles ~ L2/3. Neuro:  A&Ox3,  CN II-XII intact, normal gait Skin:  Warm, no lesions/ rash   Wt Readings from Last 3 Encounters:  08/11/17 148 lb 12.8 oz (67.5 kg)  07/28/17 146 lb (66.2 kg)  06/19/17 142 lb (64.4 kg)    Lab Results  Component Value Date   WBC 5.9 06/19/2017   HGB 15.6 (H) 06/19/2017   HCT 47.5 (H) 06/19/2017   PLT 240.0 06/19/2017   GLUCOSE 100 (H) 06/19/2017   CHOL 199 06/19/2017   TRIG 125.0 06/19/2017   HDL 53.50 06/19/2017   LDLDIRECT 118.4 09/01/2014   LDLCALC 121 (H) 06/19/2017   ALT 10 06/19/2017   AST 11 06/19/2017   NA 142 06/19/2017   K 4.0 06/19/2017   CL 103 06/19/2017   CREATININE 0.90 06/19/2017   BUN 13 06/19/2017   CO2 31 06/19/2017   TSH 1.48 06/19/2017   INR 1.28 02/05/2017    Assessment/Plan:  Acute bilateral low back pain without sciatica  -Supportive care: heat/ice, stretching. -handout given -Pt requested hydrocodone for pain.  This provider offered Mobic. - Plan: POCT Urinalysis Dipstick (Automated), meloxicam (MOBIC) 7.5 MG tablet, cyclobenzaprine (FLEXERIL) 10 MG tablet  F/u prn

## 2017-08-11 NOTE — Patient Instructions (Addendum)
Back Pain, Adult Back pain is very common. The pain often gets better over time. The cause of back pain is usually not dangerous. Most people can learn to manage their back pain on their own. Follow these instructions at home: Watch your back pain for any changes. The following actions may help to lessen any pain you are feeling:  Stay active. Start with short walks on flat ground if you can. Try to walk farther each day.  Exercise regularly as told by your doctor. Exercise helps your back heal faster. It also helps avoid future injury by keeping your muscles strong and flexible.  Do not sit, drive, or stand in one place for more than 30 minutes.  Do not stay in bed. Resting more than 1-2 days can slow down your recovery.  Be careful when you bend or lift an object. Use good form when lifting: ? Bend at your knees. ? Keep the object close to your body. ? Do not twist.  Sleep on a firm mattress. Lie on your side, and bend your knees. If you lie on your back, put a pillow under your knees.  Take medicines only as told by your doctor.  Put ice on the injured area. ? Put ice in a plastic bag. ? Place a towel between your skin and the bag. ? Leave the ice on for 20 minutes, 2-3 times a day for the first 2-3 days. After that, you can switch between ice and heat packs.  Avoid feeling anxious or stressed. Find good ways to deal with stress, such as exercise.  Maintain a healthy weight. Extra weight puts stress on your back.  Contact a doctor if:  You have pain that does not go away with rest or medicine.  You have worsening pain that goes down into your legs or buttocks.  You have pain that does not get better in one week.  You have pain at night.  You lose weight.  You have a fever or chills. Get help right away if:  You cannot control when you poop (bowel movement) or pee (urinate).  Your arms or legs feel weak.  Your arms or legs lose feeling (numbness).  You feel sick  to your stomach (nauseous) or throw up (vomit).  You have belly (abdominal) pain.  You feel like you may pass out (faint). This information is not intended to replace advice given to you by your health care provider. Make sure you discuss any questions you have with your health care provider. Document Released: 02/12/2008 Document Revised: 02/01/2016 Document Reviewed: 12/28/2013 Elsevier Interactive Patient Education  2018 [employer].  Back Exercises If you have pain in your back, do these exercises 2-3 times each day or as told by your doctor. When the pain goes away, do the exercises once each day, but repeat the steps more times for each exercise (do more repetitions). If you do not have pain in your back, do these exercises once each day or as told by your doctor. Exercises Single Knee to Chest  Do these steps 3-5 times in a row for each leg: 1. Lie on your back on a firm bed or the floor with your legs stretched out. 2. Bring one knee to your chest. 3. Hold your knee to your chest by grabbing your knee or thigh. 4. Pull on your knee until you feel a gentle stretch in your lower back. 5. Keep doing the stretch for 10-30 seconds. 6. Slowly let go of your leg  and straighten it.  Pelvic Tilt  Do these steps 5-10 times in a row: 1. Lie on your back on a firm bed or the floor with your legs stretched out. 2. Bend your knees so they point up to the ceiling. Your feet should be flat on the floor. 3. Tighten your lower belly (abdomen) muscles to press your lower back against the floor. This will make your tailbone point up to the ceiling instead of pointing down to your feet or the floor. 4. Stay in this position for 5-10 seconds while you gently tighten your muscles and breathe evenly.  Cat-Cow  Do these steps until your lower back bends more easily: 1. Get on your hands and knees on a firm surface. Keep your hands under your shoulders, and keep your knees under your hips. You may  put padding under your knees. 2. Let your head hang down, and make your tailbone point down to the floor so your lower back is round like the back of a cat. 3. Stay in this position for 5 seconds. 4. Slowly lift your head and make your tailbone point up to the ceiling so your back hangs low (sags) like the back of a cow. 5. Stay in this position for 5 seconds.  Press-Ups  Do these steps 5-10 times in a row: 1. Lie on your belly (face-down) on the floor. 2. Place your hands near your head, about shoulder-width apart. 3. While you keep your back relaxed and keep your hips on the floor, slowly straighten your arms to raise the top half of your body and lift your shoulders. Do not use your back muscles. To make yourself more comfortable, you may change where you place your hands. 4. Stay in this position for 5 seconds. 5. Slowly return to lying flat on the floor.  Bridges  Do these steps 10 times in a row: 1. Lie on your back on a firm surface. 2. Bend your knees so they point up to the ceiling. Your feet should be flat on the floor. 3. Tighten your butt muscles and lift your butt off of the floor until your waist is almost as high as your knees. If you do not feel the muscles working in your butt and the back of your thighs, slide your feet 1-2 inches farther away from your butt. 4. Stay in this position for 3-5 seconds. 5. Slowly lower your butt to the floor, and let your butt muscles relax.  If this exercise is too easy, try doing it with your arms crossed over your chest. Belly Crunches  Do these steps 5-10 times in a row: 1. Lie on your back on a firm bed or the floor with your legs stretched out. 2. Bend your knees so they point up to the ceiling. Your feet should be flat on the floor. 3. Cross your arms over your chest. 4. Tip your chin a little bit toward your chest but do not bend your neck. 5. Tighten your belly muscles and slowly raise your chest just enough to lift your  shoulder blades a tiny bit off of the floor. 6. Slowly lower your chest and your head to the floor.  Back Lifts Do these steps 5-10 times in a row: 1. Lie on your belly (face-down) with your arms at your sides, and rest your forehead on the floor. 2. Tighten the muscles in your legs and your butt. 3. Slowly lift your chest off of the floor while you keep your  hips on the floor. Keep the back of your head in line with the curve in your back. Look at the floor while you do this. 4. Stay in this position for 3-5 seconds. 5. Slowly lower your chest and your face to the floor.  Contact a doctor if:  Your back pain gets a lot worse when you do an exercise.  Your back pain does not lessen 2 hours after you exercise. If you have any of these problems, stop doing the exercises. Do not do them again unless your doctor says it is okay. Get help right away if:  You have sudden, very bad back pain. If this happens, stop doing the exercises. Do not do them again unless your doctor says it is okay. This information is not intended to replace advice given to you by your health care provider. Make sure you discuss any questions you have with your health care provider. Document Released: 09/28/2010 Document Revised: 02/01/2016 Document Reviewed: 10/20/2014 Elsevier Interactive Patient Education  [employer].

## 2017-08-12 ENCOUNTER — Ambulatory Visit: Payer: Medicare HMO | Admitting: Adult Health

## 2017-10-06 ENCOUNTER — Ambulatory Visit (INDEPENDENT_AMBULATORY_CARE_PROVIDER_SITE_OTHER): Payer: Medicare HMO | Admitting: Family Medicine

## 2017-10-06 ENCOUNTER — Encounter: Payer: Self-pay | Admitting: Family Medicine

## 2017-10-06 VITALS — BP 124/78 | HR 96 | Temp 97.5°F | Wt 154.0 lb

## 2017-10-06 DIAGNOSIS — R1013 Epigastric pain: Secondary | ICD-10-CM | POA: Diagnosis not present

## 2017-10-06 DIAGNOSIS — I714 Abdominal aortic aneurysm, without rupture: Secondary | ICD-10-CM | POA: Diagnosis not present

## 2017-10-06 LAB — CBC WITH DIFFERENTIAL/PLATELET
BASOS PCT: 0.5 % (ref 0.0–3.0)
Basophils Absolute: 0 10*3/uL (ref 0.0–0.1)
EOS PCT: 3.2 % (ref 0.0–5.0)
Eosinophils Absolute: 0.2 10*3/uL (ref 0.0–0.7)
HEMATOCRIT: 46.1 % — AB (ref 36.0–46.0)
HEMOGLOBIN: 15.5 g/dL — AB (ref 12.0–15.0)
Lymphocytes Relative: 44.5 % (ref 12.0–46.0)
Lymphs Abs: 2.7 10*3/uL (ref 0.7–4.0)
MCHC: 33.6 g/dL (ref 30.0–36.0)
MCV: 92.4 fl (ref 78.0–100.0)
MONO ABS: 0.5 10*3/uL (ref 0.1–1.0)
Monocytes Relative: 7.4 % (ref 3.0–12.0)
NEUTROS ABS: 2.7 10*3/uL (ref 1.4–7.7)
Neutrophils Relative %: 44.4 % (ref 43.0–77.0)
PLATELETS: 212 10*3/uL (ref 150.0–400.0)
RBC: 4.99 Mil/uL (ref 3.87–5.11)
RDW: 14.2 % (ref 11.5–15.5)
WBC: 6.2 10*3/uL (ref 4.0–10.5)

## 2017-10-06 LAB — HEPATIC FUNCTION PANEL
ALT: 12 U/L (ref 0–35)
AST: 13 U/L (ref 0–37)
Albumin: 4.2 g/dL (ref 3.5–5.2)
Alkaline Phosphatase: 81 U/L (ref 39–117)
BILIRUBIN DIRECT: 0.1 mg/dL (ref 0.0–0.3)
Total Bilirubin: 0.7 mg/dL (ref 0.2–1.2)
Total Protein: 6.9 g/dL (ref 6.0–8.3)

## 2017-10-06 LAB — BASIC METABOLIC PANEL
BUN: 23 mg/dL (ref 6–23)
CALCIUM: 9.4 mg/dL (ref 8.4–10.5)
CO2: 28 mEq/L (ref 19–32)
Chloride: 106 mEq/L (ref 96–112)
Creatinine, Ser: 1.37 mg/dL — ABNORMAL HIGH (ref 0.40–1.20)
GFR: 40.63 mL/min — AB (ref >60.00)
GLUCOSE: 81 mg/dL (ref 70–99)
Potassium: 4.1 mEq/L (ref 3.5–5.1)
SODIUM: 142 meq/L (ref 135–145)

## 2017-10-06 MED ORDER — TRAMADOL HCL 50 MG PO TABS: ORAL_TABLET | ORAL | 0 refills | Status: DC

## 2017-10-06 NOTE — Patient Instructions (Signed)
Stop the aspirin and the myopic,,,,,,,,,,, take Prilosec twice daily  Tramadol 50 mg,,,,,,,,,,, one half tab twice a day for pain  Complete fat free diet  Labs today,,,,,,,  We'll get you set up for an ultrasound to begin evaluation ASAP

## 2017-10-06 NOTE — Progress Notes (Signed)
Stacy Garcia is a 67 year old female who comes in today for evaluation of abdominal pain for 3 months  She points to midepigastric area the source of her abdominal pain. She says her symptoms come and go. Sometimes related to food sometimes her not. She has no fever chills nausea vomiting or diarrhea. Urinary habits are normal.  Her health maintenance shows she still colonoscopy. She had a colonoscopy 2006 that showed multiple polyps. She is to go back for follow-up but declined.  Family history pertinent in her mother sister and brother wide gallbladder disease  She had a AAA repair this past summer by Dr. Oneida Alar. She's quit smoking. She's gained weight secondary to no longer he abusing tobacco. She has underlying COPD and is followed in pulmonary.  BP 124/78 (BP Location: Left Arm, Patient Position: Sitting, Cuff Size: Normal)   Pulse 96   Temp (!) 97.5 F (36.4 C) (Oral)   Wt 154 lb (69.9 kg)   BMI 25.63 kg/m  Well-developed well-nourished female no acute distress examination of the abdomen in the supine position the abdomen is flat bowel sounds are normal. The scar from xiphoid to pubis from previous AAA repair. Well-healed. There is tenderness in the right upper quadrant no palpable masses  Rectal exam............ there is a significant amount of perirectal irritation skin breakdown. Digital rectal exam shows brown stool guaiac-negative no palpable masses  #1 abdominal pain 3 months question gallbladder disease...........Marland Kitchen begin workup.......Marland Kitchen must consider the fact she could have underlying cancer. Her last colonoscopy was 2006 she was advised to return in 2009 for follow-up but she never did because she "doesn't like a colonoscopy". She's aware of the fact that colon polyps can turn into cancer.Marland Kitchen

## 2017-10-10 IMAGING — DX DG CHEST 2V
2 series · 2 of 2 positions shown · non-contrast
Comparison: No images in PACs ; report of a chest x-ray of February 26, 2002.

CLINICAL DATA: Preop exam prior to knee replacement; current
smoker, history of emphysema

EXAM:
CHEST  2 VIEW

[chest pa]
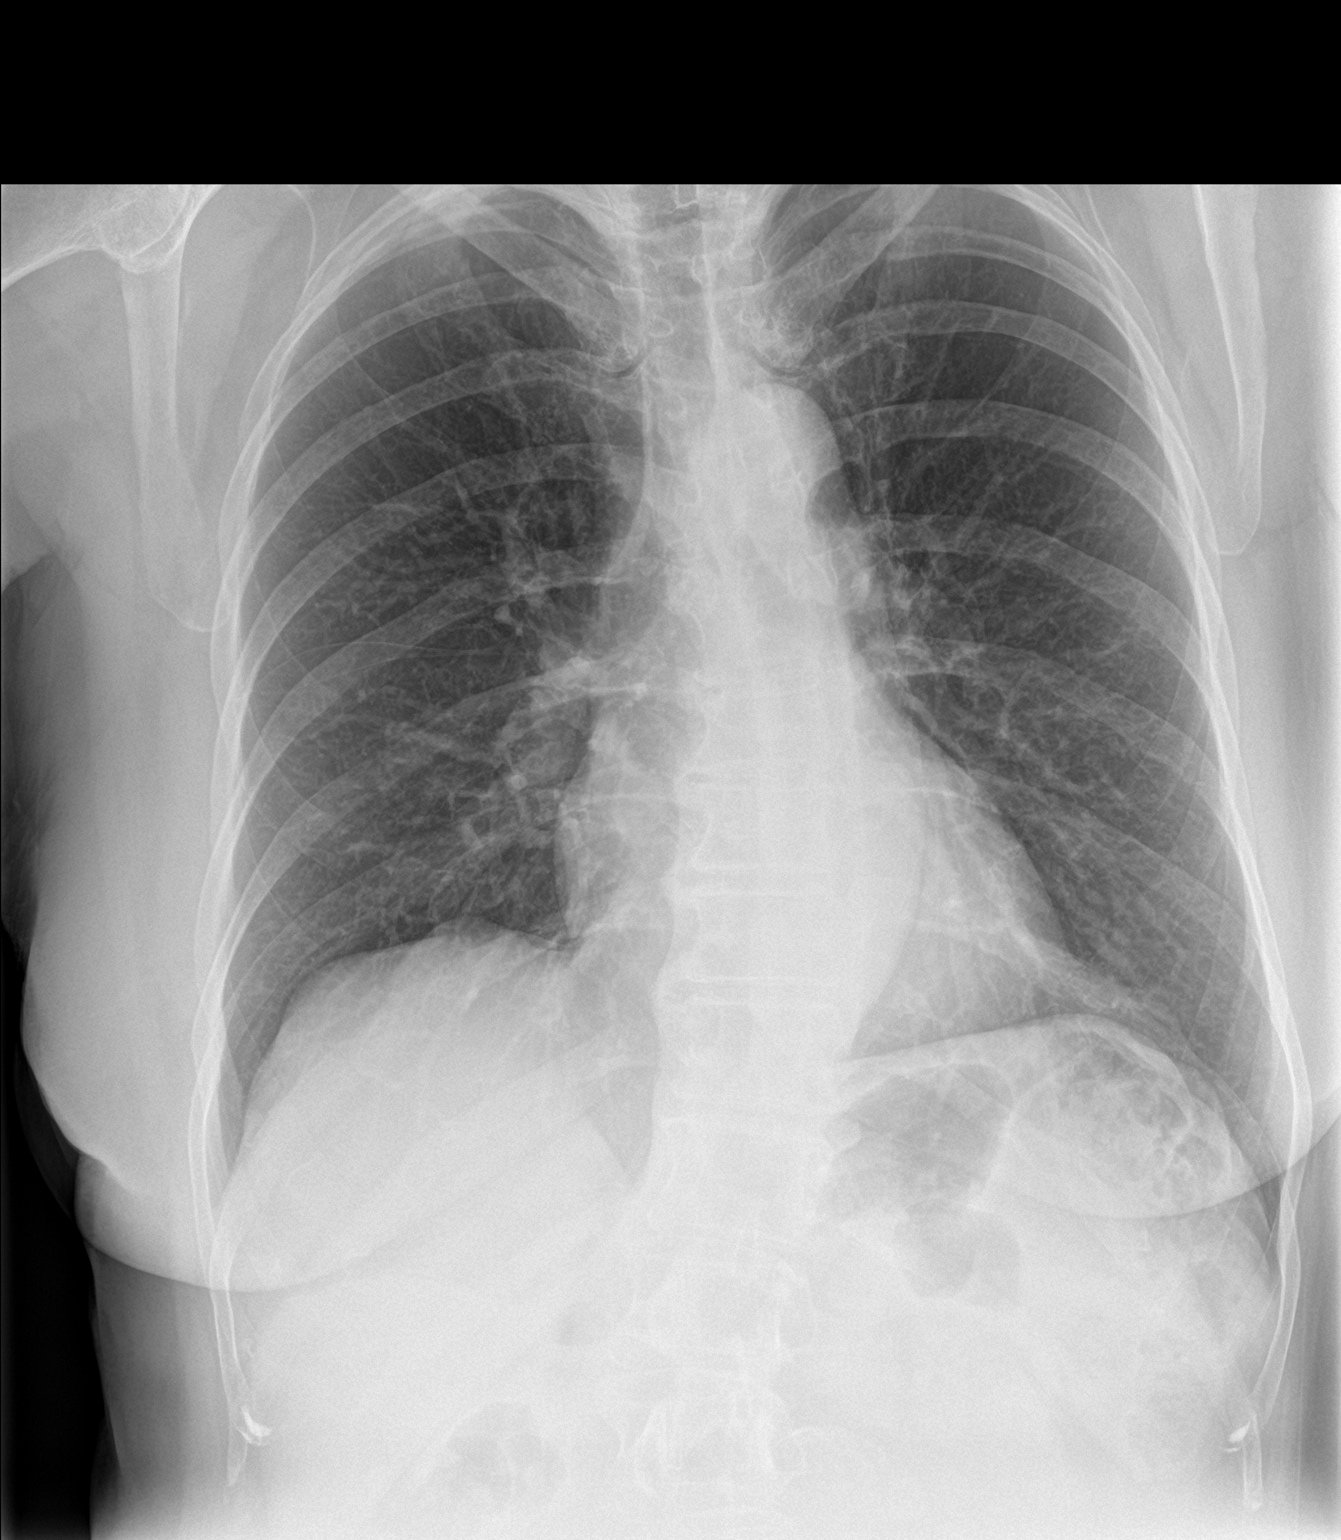

[chest lat]
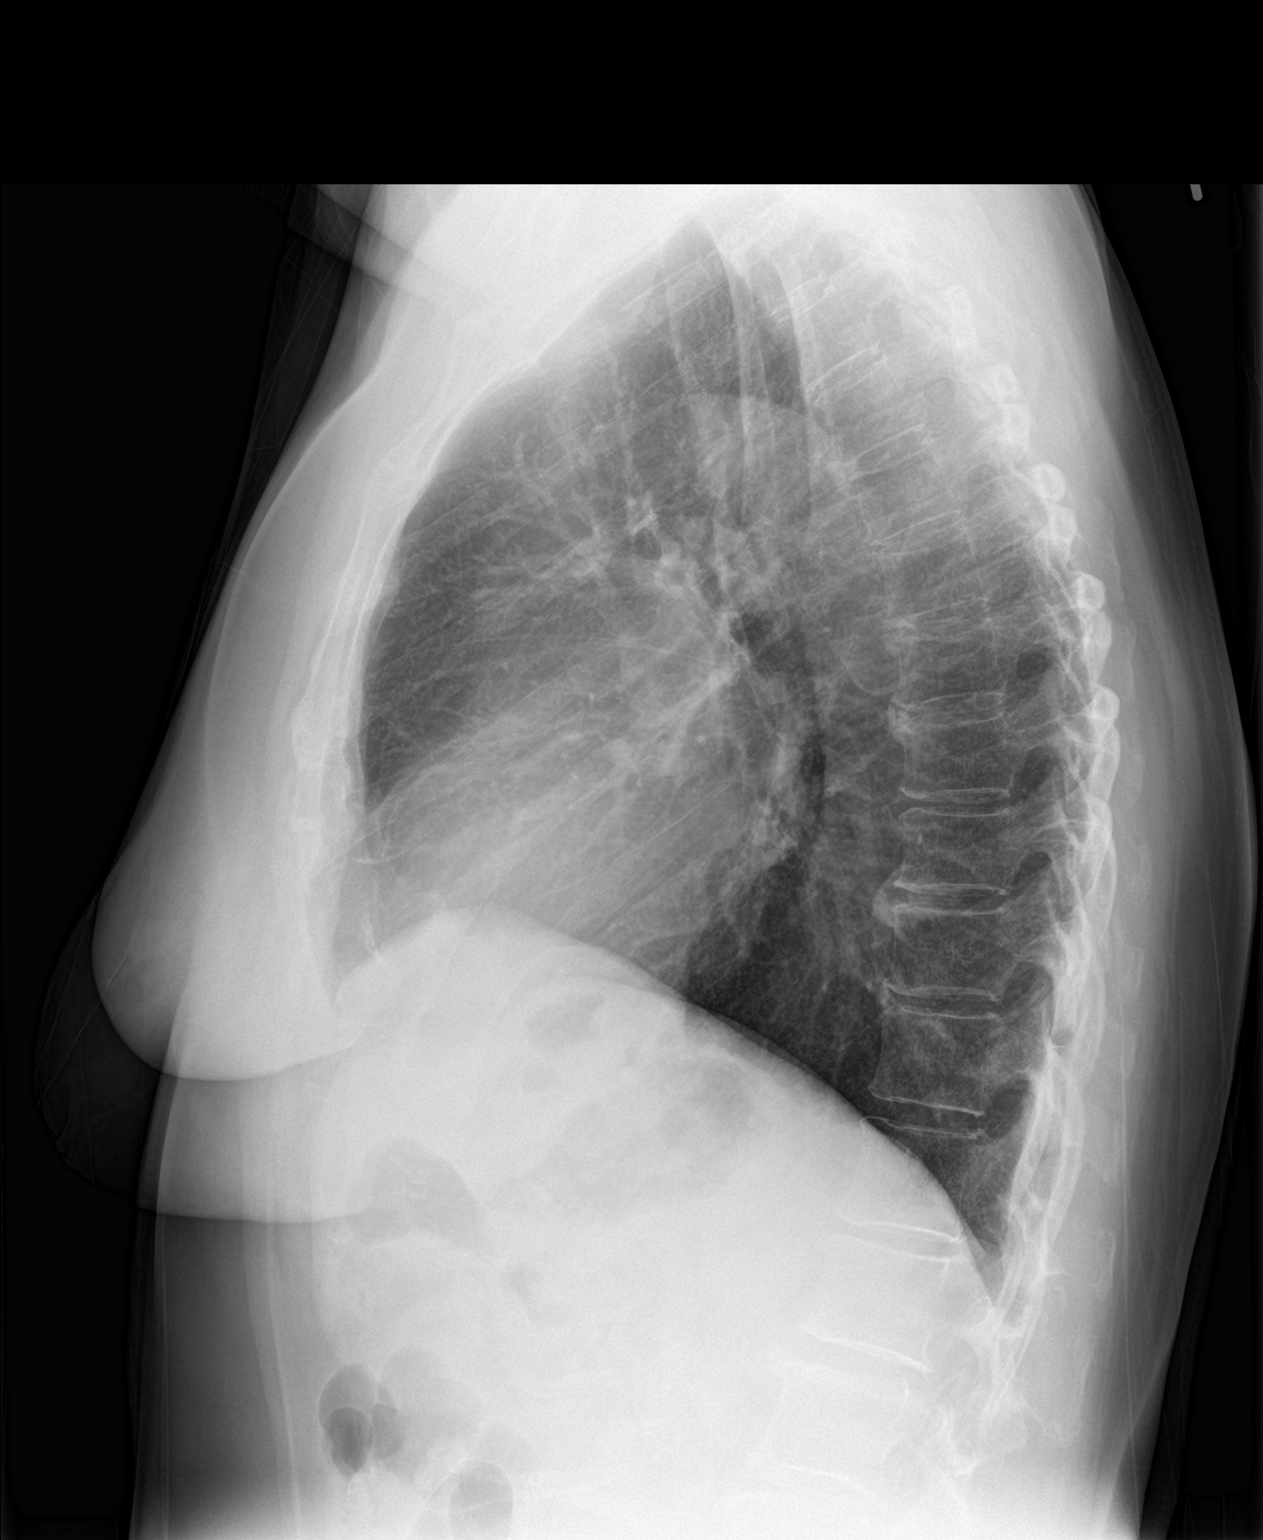

[2 of 2 positions shown; findings below may reference images not displayed]

FINDINGS: The lungs are well-expanded. The interstitial markings are coarse.
There is no alveolar infiltrate. There is no pleural effusion. The
heart and pulmonary vascularity are normal. The mediastinum is
normal in width. There is gentle levocurvature centered in the mid
to lower thoracic spine. There is mild tortuosity of the descending
thoracic aorta.
IMPRESSION: Chronic bronchitic changes. There is no acute cardiopulmonary
abnormality.

## 2017-10-22 ENCOUNTER — Ambulatory Visit
Admission: RE | Admit: 2017-10-22 | Discharge: 2017-10-22 | Disposition: A | Discharge status: Home / Self Care | Payer: Medicare HMO | Source: Ambulatory Visit | Attending: Family Medicine | Admitting: Family Medicine

## 2017-10-22 DIAGNOSIS — K760 Fatty (change of) liver, not elsewhere classified: Secondary | ICD-10-CM | POA: Diagnosis not present

## 2017-10-22 DIAGNOSIS — R1013 Epigastric pain: Secondary | ICD-10-CM

## 2017-10-27 ENCOUNTER — Other Ambulatory Visit: Payer: Self-pay

## 2017-10-27 DIAGNOSIS — R109 Unspecified abdominal pain: Secondary | ICD-10-CM

## 2017-11-04 ENCOUNTER — Encounter: Payer: Self-pay | Admitting: Gastroenterology

## 2017-11-12 ENCOUNTER — Other Ambulatory Visit: Payer: Self-pay | Admitting: Adult Health

## 2017-11-13 ENCOUNTER — Ambulatory Visit: Payer: Medicare HMO | Admitting: Gastroenterology

## 2017-11-13 ENCOUNTER — Telehealth: Payer: Self-pay

## 2017-11-13 ENCOUNTER — Encounter: Payer: Self-pay | Admitting: Gastroenterology

## 2017-11-13 ENCOUNTER — Encounter (INDEPENDENT_AMBULATORY_CARE_PROVIDER_SITE_OTHER): Payer: Self-pay

## 2017-11-13 VITALS — BP 106/70 | HR 88 | Ht 65.0 in | Wt 156.8 lb

## 2017-11-13 DIAGNOSIS — Z1211 Encounter for screening for malignant neoplasm of colon: Secondary | ICD-10-CM

## 2017-11-13 DIAGNOSIS — Z8679 Personal history of other diseases of the circulatory system: Secondary | ICD-10-CM | POA: Diagnosis not present

## 2017-11-13 DIAGNOSIS — R1013 Epigastric pain: Secondary | ICD-10-CM

## 2017-11-13 DIAGNOSIS — K219 Gastro-esophageal reflux disease without esophagitis: Secondary | ICD-10-CM | POA: Diagnosis not present

## 2017-11-13 DIAGNOSIS — Z9889 Other specified postprocedural states: Secondary | ICD-10-CM | POA: Diagnosis not present

## 2017-11-13 MED ORDER — DICYCLOMINE HCL 20 MG PO TABS: 20.0000 mg | ORAL_TABLET | Freq: Two times a day (BID) | ORAL | 2 refills | Status: DC

## 2017-11-13 MED ORDER — NA SULFATE-K SULFATE-MG SULF 17.5-3.13-1.6 GM/177ML PO SOLN: 1.0000 | ORAL | 0 refills | Status: DC

## 2017-11-13 NOTE — Telephone Encounter (Signed)
Spoke to patient. Informed her of the date and time for her CT along with instructions for contrast.Patient voiced understanding. She will come by the office next week for blood work.Marland Kitchen

## 2017-11-13 NOTE — Telephone Encounter (Signed)
Dr. Volanda Napoleon, can you look at this for [facility] while he is away?

## 2017-11-13 NOTE — Progress Notes (Signed)
Agree with assessment and plan. Would await CT result first prior to performing endoscopic exams given her recent surgery.

## 2017-11-13 NOTE — Progress Notes (Signed)
11/13/2017 DNASIA GAUNA [id number] 1951/08/13   HISTORY OF PRESENT ILLNESS: This is a pleasant 67 year old female who was previously known to Dr. Sharlett Iles for a colonoscopy in November 2006 at which time she was found to have diverticulosis and polyps that were removed.  Pathology shows adenomatous and hyperplastic polyps.  She has never had a repeat colonoscopy since that time.  She actually presents to our office today at the request of her PCP, [name], NP, with complaints of abdominal pain.  She had a open AAA repair in May 2018 and tells me that she has had abdominal pain since that time.  Says that prior to that she never had any abdominal pain similar to this.  Abdominal pain is all throughout her mid abdomen.  She admits to constipation, but says that that has been an issue on and off for years.  Her pain does extend into her epigastrium.  She tells me that she has a lot of heartburn or reflux issues, but is no longer on any acid medication as she was told to discontinue it, but is unsure why.  She does have Mobic listed on her medication list as well.  She says that her abdominal pain wakes her from sleep at night.  Denies any black or bloody stools.  She says that she has not been back to see the vascular surgeons since July.   Past Medical History:  Diagnosis Date  . AAA (abdominal aortic aneurysm) ([place])   . Arthritis   . COPD (chronic obstructive pulmonary disease) ([place])   . Dyspnea    just recently started wheezing  . GERD (gastroesophageal reflux disease)   . Hyperlipidemia   . Hypothyroidism   . Thyroid disease   . Tobacco abuse   . Vertigo    Past Surgical History:  Procedure Laterality Date  . ABDOMINAL AORTIC ANEURYSM REPAIR N/A 02/05/2017   Procedure: ANEURYSM ABDOMINAL AORTIC REPAIR;  Surgeon: Elam Dutch, MD;  Location: [facility];  Service: Vascular;  Laterality: N/A;  . CESAREAN SECTION     x 3  . JOINT REPLACEMENT    . SHOULDER SURGERY  02/2015  .  TOTAL KNEE ARTHROPLASTY Right 01/23/2016   Procedure: TOTAL KNEE ARTHROPLASTY;  Surgeon: Garald Balding, MD;  Location: [facility];  Service: Orthopedics;  Laterality: Right;  . TUBAL LIGATION      reports that she quit smoking about 10 months ago. Her smoking use included cigarettes. She has a 50.00 pack-year smoking history. she has never used smokeless tobacco. She reports that she does not drink alcohol or use drugs. family history includes Diabetes in her other; Hyperlipidemia in her other; Hypertension in her other; Thyroid disease in her other. Allergies  Allergen Reactions  . No Known Allergies       Outpatient Encounter Medications as of 11/13/2017  Medication Sig  . aspirin EC 81 MG tablet Take 81 mg by mouth at bedtime.   . diazepam (VALIUM) 5 MG tablet TALE 1/2 TO 1 TABLET BY MOUTH TWICE A DAY  . levothyroxine (SYNTHROID, LEVOTHROID) 88 MCG tablet Take 1 tablet (88 mcg total) by mouth daily.  . meloxicam (MOBIC) 7.5 MG tablet Take 1 tablet (7.5 mg total) by mouth daily.  . simvastatin (ZOCOR) 40 MG tablet TAKE 1 TABLET AT BEDTIME  . [DISCONTINUED] cyclobenzaprine (FLEXERIL) 10 MG tablet Take 1 tablet (10 mg total) by mouth at bedtime.  . [DISCONTINUED] Glycopyrrolate-Formoterol (BEVESPI AEROSPHERE) 9-4.8 MCG/ACT AERO Inhale 2 puffs 2 (  two) times daily into the lungs.  . [DISCONTINUED] levothyroxine (SYNTHROID, LEVOTHROID) 88 MCG tablet TAKE 1 TABLET BY MOUTH EVERY DAY  . [DISCONTINUED] omeprazole (PRILOSEC) 40 MG capsule TAKE 1 CAPSULE BY MOUTH EVERY DAY  . [DISCONTINUED] traMADol (ULTRAM) 50 MG tablet 1/2-1 tablet twice daily when necessary for severe abdominal pain   No facility-administered encounter medications on file as of 11/13/2017.      REVIEW OF SYSTEMS  : All other systems reviewed and negative except where noted in the History of Present Illness.   PHYSICAL EXAM: BP 106/70   Pulse 88   Ht 5\' 5"  (1.651 m)   Wt 156 lb 12.8 oz (71.1 kg)   BMI 26.09 kg/m  General:  Well developed white female in no acute distress Head: Normocephalic and atraumatic Eyes:  Sclerae anicteric, conjunctiva pink. Ears: Normal auditory acuity Lungs: Clear throughout to auscultation; no increased WOB. Heart: Regular rate and rhythm; no M/R/G. Abdomen: Soft, non-distended.  BS present.  Large laparotomy scar noted on abdomen, which is tender.  Diffuse TTP in mid-abdomen. Rectal:  Will be done at the time of colonoscopy. Musculoskeletal: Symmetrical with no gross deformities  Skin: No lesions on visible extremities Extremities: No edema  Neurological: Alert oriented x 4, grossly non-focal Psychological:  Alert and cooperative. Normal mood and affect  ASSESSMENT AND PLAN: *Abdominal pain:  Generalized mid-abdominal pain.  Began after her AAA repair.  Has a large scar that is very tender.  Will check a CT scan of the abdomen pelvis with contrast to rule out any complications related to her surgery.  I question if this is due to nerve damage and scar tissue.  I will give her some Bentyl 20 mg BID to try for now. *Epigastric abdominal pain and GERD:  Her epigastric abdominal pain may very well be related to her surgery as her scar extends into the epigastrium.  Does have long-standing reflux issues and has never undergone EGD in the past.  Is not currently on PPI because she was told to discontinue it for unknown reasons.  Will schedule for EGD with Dr. Havery Moros.  I am going to hold off on restarting any acid medication for now. *Screening colonoscopy:  Last was in 2006.  Will schedule with Dr. Havery Moros.  **The risks, benefits, and alternatives to EGD and colonoscopy were discussed with the patient and she consents to proceed.    CC:  Stacy Peng, NP

## 2017-11-13 NOTE — Patient Instructions (Addendum)
If you are age 69 or older, your body mass index should be between 23-30. Your Body mass index is 26.09 kg/m. If this is out of the aforementioned range listed, please consider follow up with your Primary Care Provider.  If you are age 25 or younger, your body mass index should be between 19-25. Your Body mass index is 26.09 kg/m. If this is out of the aformentioned range listed, please consider follow up with your Primary Care Provider.   You have been scheduled for an endoscopy and colonoscopy. Please follow the written instructions given to you at your visit today. Please pick up your prep supplies at the pharmacy within the next 1-3 days. If you use inhalers (even only as needed), please bring them with you on the day of your procedure. Your physician has requested that you go to www.startemmi.com and enter the access code given to you at your visit today. This web site gives a general overview about your procedure. However, you should still follow specific instructions given to you by our office regarding your preparation for the procedure. You have been scheduled for a CT scan of the abdomen and pelvis at [facility] ([address]---this is in the same building as [facility]).   You are scheduled on _03/19/19 at  11:00am You should arrive 15 minutes prior to your appointment time for registration. Please follow the written instructions below on the day of your exam:  WARNING: IF YOU ARE ALLERGIC TO IODINE/X-RAY DYE, PLEASE NOTIFY RADIOLOGY IMMEDIATELY AT [phone]! YOU WILL BE GIVEN A 13 HOUR PREMEDICATION PREP.  1) Do not eat or drink anything after 7:00am (4 hours prior to your test) 2) You have been given 2 bottles of oral contrast to drink. The solution may taste               better if refrigerated, but do NOT add ice or any other liquid to this solution. Shake             well before drinking.    Drink 1 bottle of contrast @ 9:ooam(2 hours prior to your  exam)  Drink 1 bottle of contrast @ 10:00am(1 hour prior to your exam)  You may take any medications as prescribed with a small amount of water except for the following: Metformin, Glucophage, Glucovance, Avandamet, Riomet, Fortamet, Actoplus Met, Janumet, Glumetza or Metaglip. The above medications must be held the day of the exam AND 48 hours after the exam.  The purpose of you drinking the oral contrast is to aid in the visualization of your intestinal tract. The contrast solution may cause some diarrhea. Before your exam is started, you will be given a small amount of fluid to drink. Depending on your individual set of symptoms, you may also receive an intravenous injection of x-ray contrast/dye. Plan on being at [facility] for 30 minutes or longer, depending on the type of exam you are having performed.  This test typically takes 30-45 minutes to complete.  If you have any questions regarding your exam or if you need to reschedule, you may call the CT department at [phone] between the hours of 8:00 am and 5:00 pm, Monday-Friday.  ________________________________________________________________________

## 2017-11-14 NOTE — Telephone Encounter (Signed)
Refilled by this provider who was covering for pt's pcp while they were out of the office.

## 2017-11-17 ENCOUNTER — Other Ambulatory Visit (INDEPENDENT_AMBULATORY_CARE_PROVIDER_SITE_OTHER): Payer: Medicare HMO

## 2017-11-17 DIAGNOSIS — R1013 Epigastric pain: Secondary | ICD-10-CM

## 2017-11-17 DIAGNOSIS — K219 Gastro-esophageal reflux disease without esophagitis: Secondary | ICD-10-CM | POA: Diagnosis not present

## 2017-11-17 LAB — BASIC METABOLIC PANEL
BUN: 14 mg/dL (ref 6–23)
CO2: 29 mEq/L (ref 19–32)
Calcium: 9.8 mg/dL (ref 8.4–10.5)
Chloride: 104 mEq/L (ref 96–112)
Creatinine, Ser: 0.87 mg/dL (ref 0.40–1.20)
GFR: 68.6 mL/min (ref >60.00)
Glucose, Bld: 81 mg/dL (ref 70–99)
POTASSIUM: 4.2 meq/L (ref 3.5–5.1)
SODIUM: 141 meq/L (ref 135–145)

## 2017-11-25 ENCOUNTER — Ambulatory Visit (INDEPENDENT_AMBULATORY_CARE_PROVIDER_SITE_OTHER)
Admission: RE | Admit: 2017-11-25 | Discharge: 2017-11-25 | Disposition: A | Discharge status: Home / Self Care | Payer: Medicare HMO | Source: Ambulatory Visit | Attending: Gastroenterology | Admitting: Gastroenterology

## 2017-11-25 DIAGNOSIS — R109 Unspecified abdominal pain: Secondary | ICD-10-CM | POA: Diagnosis not present

## 2017-11-25 DIAGNOSIS — R1013 Epigastric pain: Secondary | ICD-10-CM | POA: Diagnosis not present

## 2017-11-25 MED ORDER — IOPAMIDOL (ISOVUE-300) INJECTION 61%
100.0000 mL | Freq: Once | INTRAVENOUS | Status: AC | PRN
  Administered 2017-11-25: 100 mL via INTRAVENOUS

## 2017-12-15 ENCOUNTER — Encounter: Payer: Self-pay | Admitting: Family Medicine

## 2017-12-15 ENCOUNTER — Ambulatory Visit (INDEPENDENT_AMBULATORY_CARE_PROVIDER_SITE_OTHER): Payer: Medicare HMO | Admitting: Family Medicine

## 2017-12-15 VITALS — BP 124/80 | HR 88 | Temp 98.0°F | Wt 156.9 lb

## 2017-12-15 DIAGNOSIS — M5116 Intervertebral disc disorders with radiculopathy, lumbar region: Secondary | ICD-10-CM | POA: Diagnosis not present

## 2017-12-15 DIAGNOSIS — J069 Acute upper respiratory infection, unspecified: Secondary | ICD-10-CM | POA: Insufficient documentation

## 2017-12-15 DIAGNOSIS — B9789 Other viral agents as the cause of diseases classified elsewhere: Secondary | ICD-10-CM

## 2017-12-15 MED ORDER — PREDNISONE 20 MG PO TABS: ORAL_TABLET | ORAL | 1 refills | Status: DC

## 2017-12-15 MED ORDER — HYDROCODONE-HOMATROPINE 5-1.5 MG/5ML PO SYRP: ORAL_SOLUTION | ORAL | 0 refills | Status: DC

## 2017-12-15 NOTE — Progress Notes (Signed)
Stacy Garcia is a 67 year old married female X smoker for one year who comes in today for evaluation of 2 problems  She states for the past 6 days she's had a cough. No fever earache sore throat nausea vomiting diarrhea chills etc. Etc.  She's had no allergy symptoms  4 months ago she was lifting her granddaughter and felt severe sharp pain in her right hip rating down to the right calf. Since that time the pain is been fairly constant although it waxes and wanes in severity. Most the time it's a 3 it's its worse is so 7. The pain radiates down to her right calf. The pain is decreased by nothing increased by standing for long periods of time also increased by coughing. She's had no bowel or bladder dysfunction.  She denies any previous back trouble..  BP 124/80 (BP Location: Right Arm, Patient Position: Sitting, Cuff Size: Normal)   Pulse 88   Temp 98 F (36.7 C) (Oral)   Wt 156 lb 14.4 oz (71.2 kg)   SpO2 98%   BMI 26.11 kg/m  Well-developed well-nourished female no acute distress vital signs stable she's afebrile ENT neck lung exams all normal  There is no palpable bony tenderness. In the supine position both legs reveal equal length. Sensation muscle strength reflexes all within normal limits. Straight leg raising positive on the right at 45 on the left referred to the right at about 60.  #1 cough secondary to viral syndrome........... Treat symptomatically  #2 lumbar disc disease with radiation down her right leg......... Prednisone burst and taper physical therapy as outlined

## 2017-12-15 NOTE — Patient Instructions (Signed)
For your cough........ Hydromet 1/2 teaspoon 3 times a day when necessary  For your low back pain...Marland KitchenMarland KitchenMarland Kitchen Prednisone 20 mg........ 2 tabs 3 days taper as outlined  Return in one week for follow-up  Ideally should stay at bedrest for 2 full days

## 2017-12-22 ENCOUNTER — Ambulatory Visit: Payer: Medicare HMO | Admitting: Family Medicine

## 2017-12-22 DIAGNOSIS — Z0289 Encounter for other administrative examinations: Secondary | ICD-10-CM

## 2017-12-23 ENCOUNTER — Encounter: Payer: Self-pay | Admitting: Gastroenterology

## 2018-01-06 ENCOUNTER — Other Ambulatory Visit: Payer: Self-pay | Admitting: *Deleted

## 2018-01-06 ENCOUNTER — Encounter: Payer: Self-pay | Admitting: Gastroenterology

## 2018-01-06 ENCOUNTER — Other Ambulatory Visit: Payer: Self-pay

## 2018-01-06 ENCOUNTER — Ambulatory Visit (AMBULATORY_SURGERY_CENTER): Payer: Medicare HMO | Admitting: Gastroenterology

## 2018-01-06 VITALS — BP 184/96 | HR 85 | Temp 97.1°F | Resp 20 | Ht 65.0 in | Wt 156.0 lb

## 2018-01-06 DIAGNOSIS — D125 Benign neoplasm of sigmoid colon: Secondary | ICD-10-CM | POA: Diagnosis not present

## 2018-01-06 DIAGNOSIS — K209 Esophagitis, unspecified without bleeding: Secondary | ICD-10-CM

## 2018-01-06 DIAGNOSIS — D123 Benign neoplasm of transverse colon: Secondary | ICD-10-CM | POA: Diagnosis not present

## 2018-01-06 DIAGNOSIS — K253 Acute gastric ulcer without hemorrhage or perforation: Secondary | ICD-10-CM | POA: Diagnosis not present

## 2018-01-06 DIAGNOSIS — E039 Hypothyroidism, unspecified: Secondary | ICD-10-CM | POA: Diagnosis not present

## 2018-01-06 DIAGNOSIS — J449 Chronic obstructive pulmonary disease, unspecified: Secondary | ICD-10-CM | POA: Diagnosis not present

## 2018-01-06 DIAGNOSIS — D120 Benign neoplasm of cecum: Secondary | ICD-10-CM

## 2018-01-06 DIAGNOSIS — D122 Benign neoplasm of ascending colon: Secondary | ICD-10-CM

## 2018-01-06 DIAGNOSIS — R1013 Epigastric pain: Secondary | ICD-10-CM | POA: Diagnosis not present

## 2018-01-06 DIAGNOSIS — K2270 Barrett's esophagus without dysplasia: Secondary | ICD-10-CM | POA: Diagnosis not present

## 2018-01-06 DIAGNOSIS — K2950 Unspecified chronic gastritis without bleeding: Secondary | ICD-10-CM | POA: Diagnosis not present

## 2018-01-06 DIAGNOSIS — Z1211 Encounter for screening for malignant neoplasm of colon: Secondary | ICD-10-CM

## 2018-01-06 DIAGNOSIS — K219 Gastro-esophageal reflux disease without esophagitis: Secondary | ICD-10-CM | POA: Diagnosis not present

## 2018-01-06 MED ORDER — OMEPRAZOLE 40 MG PO CPDR: DELAYED_RELEASE_CAPSULE | ORAL | 11 refills | Status: DC

## 2018-01-06 MED ORDER — SODIUM CHLORIDE 0.9 % IV SOLN: 500.0000 mL | Freq: Once | INTRAVENOUS | Status: DC

## 2018-01-06 NOTE — Op Note (Signed)
[facility] Patient Name: Stacy Garcia Procedure Date: 01/06/2018 7:45 AM MRN: [id number] Endoscopist: Remo Lipps P. Oddie Bottger MD, MD Age: 67 Referring MD:  Date of Birth: 01-May-1951 Gender: Female Account #: [id number] Procedure:                Colonoscopy Indications:              Surveillance: Personal history of adenomatous                            polyps on last colonoscopy (done > 10 years ago) Medicines:                Monitored Anesthesia Care Procedure:                Pre-Anesthesia Assessment:                           - Prior to the procedure, a History and Physical                            was performed, and patient medications and                            allergies were reviewed. The patient's tolerance of                            previous anesthesia was also reviewed. The risks                            and benefits of the procedure and the sedation                            options and risks were discussed with the patient.                            All questions were answered, and informed consent                            was obtained. Prior Anticoagulants: The patient has                            taken no previous anticoagulant or antiplatelet                            agents. ASA Grade Assessment: III - A patient with                            severe systemic disease. After reviewing the risks                            and benefits, the patient was deemed in                            satisfactory condition to undergo the procedure.  After obtaining informed consent, the colonoscope                            was passed under direct vision. Throughout the                            procedure, the patient's blood pressure, pulse, and                            oxygen saturations were monitored continuously. The                            Colonoscope was introduced through the anus and                            advanced  to the the cecum, identified by                            appendiceal orifice and ileocecal valve. The                            colonoscopy was performed without difficulty. The                            patient tolerated the procedure well. The quality                            of the bowel preparation was adequate. The                            ileocecal valve, appendiceal orifice, and rectum                            were photographed. Scope In: 8:14:30 AM Scope Out: 8:41:33 AM Scope Withdrawal Time: 0 hours 20 minutes 30 seconds  Total Procedure Duration: 0 hours 27 minutes 3 seconds  Findings:                 The perianal and digital rectal examinations were                            normal.                           A 7 mm polyp was found in the cecum. The polyp was                            sessile. The polyp was removed with a cold snare.                            Resection and retrieval were complete.                           A single small angiodysplastic lesion was found in  the ascending colon.                           A 6 mm polyp was found in the ascending colon. The                            polyp was sessile. The polyp was removed with a                            cold snare. Resection and retrieval were complete.                           A 6 mm polyp was found in the hepatic flexure. The                            polyp was sessile. The polyp was removed with a                            cold snare. Resection and retrieval were complete.                           A 3 mm polyp was found in the hepatic flexure. The                            polyp was sessile. The polyp was removed with a                            cold biopsy forceps. Resection and retrieval were                            complete.                           Two sessile polyps were found in the transverse                            colon. The polyps were 4 to 5 mm  in size. These                            polyps were removed with a cold snare. Resection                            and retrieval were complete.                           Three sessile polyps were found in the sigmoid                            colon. The polyps were 3 to 4 mm in size. These                            polyps were removed with a cold snare. Resection  and retrieval were complete.                           Multiple small-mouthed diverticula were found in                            the sigmoid colon.                           Internal hemorrhoids were found during retroflexion.                           The exam was otherwise without abnormality. Complications:            No immediate complications. Estimated blood loss:                            Minimal. Estimated Blood Loss:     Estimated blood loss was minimal. Impression:               - One 7 mm polyp in the cecum, removed with a cold                            snare. Resected and retrieved.                           - A single colonic angiodysplastic lesion.                           - One 6 mm polyp in the ascending colon, removed                            with a cold snare. Resected and retrieved.                           - One 6 mm polyp at the hepatic flexure, removed                            with a cold snare. Resected and retrieved.                           - One 3 mm polyp at the hepatic flexure, removed                            with a cold biopsy forceps. Resected and retrieved.                           - Two 4 to 5 mm polyps in the transverse colon,                            removed with a cold snare. Resected and retrieved.                           - Three 3 to 4 mm polyps in the sigmoid colon,  removed with a cold snare. Resected and retrieved.                           - Diverticulosis in the sigmoid colon.                           - Internal  hemorrhoids.                           - The examination was otherwise normal. Recommendation:           - Patient has a contact number available for                            emergencies. The signs and symptoms of potential                            delayed complications were discussed with the                            patient. Return to normal activities tomorrow.                            Written discharge instructions were provided to the                            patient.                           - Resume previous diet.                           - Continue present medications.                           - Await pathology results.                           - Repeat colonoscopy date to be determined after                            pending pathology results are reviewed for                            surveillance. Remo Lipps P. Arisbeth Purrington MD, MD 01/06/2018 8:48:47 AM This report has been signed electronically.

## 2018-01-06 NOTE — Patient Instructions (Signed)
YOU HAD AN ENDOSCOPIC PROCEDURE TODAY AT [facility]:   Refer to the procedure report that was given to you for any specific questions about what was found during the examination.  If the procedure report does not answer your questions, please call your gastroenterologist to clarify.  If you requested that your care partner not be given the details of your procedure findings, then the procedure report has been included in a sealed envelope for you to review at your convenience later.  YOU SHOULD EXPECT: Some feelings of bloating in the abdomen. Passage of more gas than usual.  Walking can help get rid of the air that was put into your GI tract during the procedure and reduce the bloating. If you had a lower endoscopy (such as a colonoscopy or flexible sigmoidoscopy) you may notice spotting of blood in your stool or on the toilet paper. If you underwent a bowel prep for your procedure, you may not have a normal bowel movement for a few days.  Please Note:  You might notice some irritation and congestion in your nose or some drainage.  This is from the oxygen used during your procedure.  There is no need for concern and it should clear up in a day or so.  SYMPTOMS TO REPORT IMMEDIATELY:   Following lower endoscopy (colonoscopy or flexible sigmoidoscopy):  Excessive amounts of blood in the stool  Significant tenderness or worsening of abdominal pains  Swelling of the abdomen that is new, acute  Fever of 100F or higher   Following upper endoscopy (EGD)  Vomiting of blood or coffee ground material  New chest pain or pain under the shoulder blades  Painful or persistently difficult swallowing  New shortness of breath  Fever of 100F or higher  Black, tarry-looking stools  For urgent or emergent issues, a gastroenterologist can be reached at any hour by calling [phone].   DIET:  We do recommend a small meal at first, but then you may proceed to your regular diet.  Drink  plenty of fluids but you should avoid alcoholic beverages for 24 hours.  ACTIVITY:  You should plan to take it easy for the rest of today and you should NOT DRIVE or use heavy machinery until tomorrow (because of the sedation medicines used during the test).    FOLLOW UP: Our staff will call the number listed on your records the next business day following your procedure to check on you and address any questions or concerns that you may have regarding the information given to you following your procedure. If we do not reach you, we will leave a message.  However, if you are feeling well and you are not experiencing any problems, there is no need to return our call.  We will assume that you have returned to your regular daily activities without incident.  If any biopsies were taken you will be contacted by phone or by letter within the next 1-3 weeks.  Please call us at [phone] if you have not heard about the biopsies in 3 weeks.    SIGNATURES/CONFIDENTIALITY: You and/or your care partner have signed paperwork which will be entered into your electronic medical record.  These signatures attest to the fact that that the information above on your After Visit Summary has been reviewed and is understood.  Full responsibility of the confidentiality of this discharge information lies with you and/or your care-partner.   Polyp, diverticulosis, and hemorrhoid information given.  Esophagitis information given  Start Omeprazole 40mg . Twice daily for one month, then once daily.    Minimize use of Nsaids, stop using Mobic routinely.

## 2018-01-06 NOTE — Progress Notes (Signed)
Pt's states no medical or surgical changes since previsit or office visit. 

## 2018-01-06 NOTE — Progress Notes (Signed)
Called to room to assist during endoscopic procedure.  Patient ID and intended procedure confirmed with present staff. Received instructions for my participation in the procedure from the performing physician.  

## 2018-01-06 NOTE — Progress Notes (Signed)
To recovery, report to RN, VSS. 

## 2018-01-06 NOTE — Op Note (Signed)
[place] Patient Name: Stacy Garcia Procedure Date: 01/06/2018 7:44 AM MRN: [id number] Endoscopist: Remo Lipps P. Armbruster MD, MD Age: 67 Referring MD:  Date of Birth: 1951/08/28 Gender: Female Account #: [id number] Procedure:                Upper GI endoscopy Indications:              Epigastric abdominal pain Medicines:                Monitored Anesthesia Care Procedure:                Pre-Anesthesia Assessment:                           - Prior to the procedure, a History and Physical                            was performed, and patient medications and                            allergies were reviewed. The patient's tolerance of                            previous anesthesia was also reviewed. The risks                            and benefits of the procedure and the sedation                            options and risks were discussed with the patient.                            All questions were answered, and informed consent                            was obtained. Prior Anticoagulants: The patient has                            taken no previous anticoagulant or antiplatelet                            agents. ASA Grade Assessment: III - A patient with                            severe systemic disease. After reviewing the risks                            and benefits, the patient was deemed in                            satisfactory condition to undergo the procedure.                           After obtaining informed consent, the endoscope was  passed under direct vision. Throughout the                            procedure, the patient's blood pressure, pulse, and                            oxygen saturations were monitored continuously. The                            Model GIF-HQ190 [id number]) scope was introduced                            through the mouth, and advanced to the third part                            of duodenum. The  upper GI endoscopy was                            accomplished without difficulty. The patient                            tolerated the procedure well. Scope In: Scope Out: Findings:                 Esophagogastric landmarks were identified: the                            Z-line was found at 36 cm, the gastroesophageal                            junction was found at 36 cm and the upper extent of                            the gastric folds was found at 36 cm from the                            incisors.                           LA Grade B esophagitis was found 36 cm from the                            incisors.                           Mucosal changes characterized by nodularity were                            found at the gastroesophageal junction. I suspect                            this represents inflammatory change from reflux but                            biopsies were taken with a  cold forceps for                            histology.                           The exam of the esophagus was otherwise normal.                           Few non-bleeding superficial clean based gastric                            ulcers with no stigmata of bleeding were found in                            the gastric antrum. The largest lesion was 3 mm in                            largest dimension. Biopsies were taken with a cold                            forceps from the antrum, body, and incisura for                            Helicobacter pylori testing.                           Patchy mildly erythematous mucosa was found in the                            gastric body and in the gastric antrum.                           The exam of the stomach was otherwise normal.                           The duodenal bulb and second portion of the                            duodenum were normal. Complications:            No immediate complications. Estimated blood loss:                             Minimal. Estimated Blood Loss:     Estimated blood loss was minimal. Impression:               - Esophagogastric landmarks identified.                           - LA Grade B reflux esophagitis.                           - Nodular mucosa at the GEJ as outlined, I suspect  inflammatory changes due to reflux. Biopsied.                           - Non-bleeding gastric ulcers with no stigmata of                            bleeding. Biopsied.                           - Erythematous mucosa in the gastric body and                            antrum.                           - Normal duodenal bulb and second portion of the                            duodenum. Recommendation:           - Patient has a contact number available for                            emergencies. The signs and symptoms of potential                            delayed complications were discussed with the                            patient. Return to normal activities tomorrow.                            Written discharge instructions were provided to the                            patient.                           - Resume previous diet.                           - Continue present medications.                           - Start omeprazole 40mg  BID for 1 month, then                            decrease to once daily                           - Minimize NSAID use (stop Mobic taking routinely)                           - Await pathology results. Remo Lipps P. Armbruster MD, MD 01/06/2018 8:57:55 AM This report has been signed electronically.

## 2018-01-07 ENCOUNTER — Other Ambulatory Visit: Payer: Self-pay | Admitting: Family Medicine

## 2018-01-07 ENCOUNTER — Telehealth: Payer: Self-pay

## 2018-01-07 NOTE — Telephone Encounter (Signed)
  Follow up Call-  Call back number 01/06/2018  Post procedure Call Back phone  # [phone]  Permission to leave phone message Yes  Some recent data might be hidden     Patient questions:  Do you have a fever, pain , or abdominal swelling? No. Pain Score  0 *  Have you tolerated food without any problems? Yes.    Have you been able to return to your normal activities? Yes.    Do you have any questions about your discharge instructions: Diet   No. Medications  No. Follow up visit  No.  Do you have questions or concerns about your Care? No.  Actions: * If pain score is 4 or above: No action needed, pain <4.

## 2018-01-13 ENCOUNTER — Other Ambulatory Visit: Payer: Self-pay

## 2018-01-13 MED ORDER — METRONIDAZOLE 500 MG PO TABS: 500.0000 mg | ORAL_TABLET | Freq: Two times a day (BID) | ORAL | 0 refills | Status: DC

## 2018-01-13 MED ORDER — OMEPRAZOLE 40 MG PO CPDR: DELAYED_RELEASE_CAPSULE | ORAL | 11 refills | Status: DC

## 2018-01-13 MED ORDER — CLARITHROMYCIN 500 MG PO TABS: 500.0000 mg | ORAL_TABLET | Freq: Two times a day (BID) | ORAL | 0 refills | Status: DC

## 2018-01-13 MED ORDER — AMOXICILLIN 500 MG PO TABS: 1000.0000 mg | ORAL_TABLET | Freq: Two times a day (BID) | ORAL | 0 refills | Status: DC

## 2018-01-28 ENCOUNTER — Other Ambulatory Visit: Payer: Self-pay | Admitting: Family Medicine

## 2018-01-28 NOTE — Telephone Encounter (Signed)
Cory pt 

## 2018-01-28 NOTE — Telephone Encounter (Signed)
Denied.  One time medication.

## 2018-02-03 ENCOUNTER — Ambulatory Visit (INDEPENDENT_AMBULATORY_CARE_PROVIDER_SITE_OTHER): Payer: Medicare HMO | Admitting: Family Medicine

## 2018-02-03 ENCOUNTER — Encounter: Payer: Self-pay | Admitting: Family Medicine

## 2018-02-03 ENCOUNTER — Other Ambulatory Visit (HOSPITAL_COMMUNITY)
Admission: RE | Admit: 2018-02-03 | Discharge: 2018-02-03 | Disposition: A | Discharge status: Home / Self Care | Payer: Medicare HMO | Source: Ambulatory Visit | Attending: Family Medicine | Admitting: Family Medicine

## 2018-02-03 VITALS — BP 110/82 | HR 106 | Temp 98.2°F | Wt 158.0 lb

## 2018-02-03 DIAGNOSIS — Z129 Encounter for screening for malignant neoplasm, site unspecified: Secondary | ICD-10-CM | POA: Diagnosis not present

## 2018-02-03 DIAGNOSIS — G8929 Other chronic pain: Secondary | ICD-10-CM

## 2018-02-03 DIAGNOSIS — M545 Low back pain, unspecified: Secondary | ICD-10-CM

## 2018-02-03 DIAGNOSIS — N760 Acute vaginitis: Secondary | ICD-10-CM | POA: Diagnosis not present

## 2018-02-03 MED ORDER — FLUCONAZOLE 150 MG PO TABS: ORAL_TABLET | ORAL | 2 refills | Status: DC

## 2018-02-03 MED ORDER — ESTROGENS, CONJUGATED 0.625 MG/GM VA CREA: 1.0000 | TOPICAL_CREAM | Freq: Every day | VAGINAL | 12 refills | Status: DC

## 2018-02-03 NOTE — Patient Instructions (Addendum)
OTC Monistat cream........... apply small amounts externally twice daily for 1 week then apply at bedtime for 2 weeks  Diflucan 150 mg..........Marland Kitchen 1 tablet every third day for 3 doses.........Marland Kitchen May refill 2  1C fungal infection is resolved the no begin the use Premarin cream........... small amounts externally daily for 1 month then 3 times weekly forever.......... the best price on the Premarin cream is [email or web address]  We will set up a consult with one of the orthopedists in your health plan ASAP

## 2018-02-03 NOTE — Progress Notes (Signed)
Annetta is a 67 year old married female nonsmoker who comes in today for evaluation of vaginal irritation 1 week  She does not recall a specific trigger. She'll ice take showers no tub baths no change in detergent etc. etc. etc.  She is 19 years postmenopausal. She's never used HRT.  She's also complaining of dyspareunia. She says she had her AAA repair last year she's had dyspareunia.  When we were done discussing treatment for vaginitis she said oh by the way her back pain is gotten worse. She's had a history of chronic lumbar disc disease. She's currently taking Moberg 7.5 mg daily. She tried a Dosepak prednisone which was 40 mg for 3 days and taper over 3 weeks however that didn't help at all either.    BP 110/82 (BP Location: Left Arm, Patient Position: Sitting, Cuff Size: Normal)   Pulse (!) 106   Temp 98.2 F (36.8 C) (Oral)   Wt 158 lb (71.7 kg)   BMI 26.29 kg/m  Well-developed well-nourished female no acute distress vital signs stable she's afebrile. Examination of the abdomen shows a midline scar from previous AAA repair. Rest abdominal exam was negative. Pelvic examination....... external genitalia red and swollen. Vaginal vault normal cervix was visualized Pap smear was done because she hasn't had one in 3 years. Bimanual exam was negative.  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  #2 chronic low back pain unresponsive to conservative therapy.............. consult with Dr. Durward Fortes

## 2018-02-04 LAB — CYTOLOGY - PAP: Diagnosis: NEGATIVE

## 2018-02-07 ENCOUNTER — Other Ambulatory Visit: Payer: Self-pay | Admitting: Gastroenterology

## 2018-02-10 ENCOUNTER — Encounter (INDEPENDENT_AMBULATORY_CARE_PROVIDER_SITE_OTHER): Payer: Self-pay | Admitting: Orthopaedic Surgery

## 2018-02-10 ENCOUNTER — Ambulatory Visit (INDEPENDENT_AMBULATORY_CARE_PROVIDER_SITE_OTHER): Payer: Medicare HMO | Admitting: Orthopaedic Surgery

## 2018-02-10 VITALS — BP 119/92 | HR 92 | Ht 65.0 in | Wt 160.0 lb

## 2018-02-10 DIAGNOSIS — M5441 Lumbago with sciatica, right side: Secondary | ICD-10-CM

## 2018-02-10 DIAGNOSIS — M4807 Spinal stenosis, lumbosacral region: Secondary | ICD-10-CM | POA: Diagnosis not present

## 2018-02-10 DIAGNOSIS — G8929 Other chronic pain: Secondary | ICD-10-CM

## 2018-02-10 NOTE — Progress Notes (Signed)
Office Visit Note   Patient: Stacy Garcia           Date of Birth: 10-19-1950           MRN: [id number] Visit Date: 02/10/2018              Requested by: Stacy Peng, NP Stacy Garcia,  [id number] PCP: Stacy Peng, NP   Assessment & Plan: Visit Diagnoses:  1. Chronic right-sided low back pain with right-sided sciatica     Plan: Chronic recurrent low back pain with right lower extremity radiculopathy.  Certainly could have spinal stenosis associated with the arthritic changes by x-ray.  I will obtain an MRI scan.  Also try a lumbar support to see if that helps her with her pain.  This is greater might be a candidate for an epidural steroid injection depending upon the results.  Concerned about giving her any medicines based on her GI issues  Follow-Up Instructions: Return after MRI L-S spine.   Orders:  No orders of the defined types were placed in this encounter.  No orders of the defined types were placed in this encounter.     Procedures: No procedures performed   Clinical Data: No additional findings.   Subjective: Chief Complaint  Patient presents with  . Lower Back - Pain  . Follow-up    LOW BACK PAIN FOR SEVERAL YEARS, RADIATES TO RIGHT HIP  Stacy Garcia is seen for for evaluation of chronic low back pain associated with radiculopathy in her right lower extremity.  We last saw her in December with similar complaints.  I suggested an MRI scan.  It was never performed.  She is recently had an exacerbation without recurrent injury or trauma.  She has difficulty with increasing pain mostly in her back when she stands for any length of time or when she walks.  She then have some pain into her right lower extremity that oftentimes feels like it is heavy or possibly even "weak".  She is had a prior right total knee replacement that does not really give her much trouble.  She has not had any fever or chills.  She has had many issues with her  "stomach".  She takes ibuprofen for discomfort.  Films of the lumbar spine notes some anterior listhesis of L5 on S1 associated with degenerative disc disease at L3 and L4 HPI  Review of Systems  Constitutional: Positive for fatigue. Negative for fever.  HENT: Negative for ear pain.   Eyes: Negative for pain.  Respiratory: Positive for shortness of breath. Negative for cough.   Cardiovascular: Negative for leg swelling.  Gastrointestinal: Negative for constipation and diarrhea.  Genitourinary: Negative for difficulty urinating.  Musculoskeletal: Positive for back pain and neck pain.  Skin: Negative for rash.  Allergic/Immunologic: Negative for food allergies.  Neurological: Positive for weakness and numbness.  Hematological: Bruises/bleeds easily.  Psychiatric/Behavioral: Positive for sleep disturbance.     Objective: Vital Signs: BP (!) 119/92 (BP Location: Left Arm, Patient Position: Sitting, Cuff Size: Normal)   Pulse 92   Ht 5\' 5"  (1.651 m)   Wt 160 lb (72.6 kg)   BMI 26.63 kg/m   Physical Exam  Constitutional: She is oriented to person, place, and time. She appears well-developed and well-nourished.  HENT:  Mouth/Throat: Oropharynx is clear and moist.  Eyes: Pupils are equal, round, and reactive to light. EOM are normal.  Pulmonary/Chest: Effort normal.  Neurological: She is alert and oriented to person, place,  and time.  Skin: Skin is warm and dry.  Psychiatric: She has a normal mood and affect. Her behavior is normal.    Ortho Exam awake alert and oriented x3.  Comfortable sitting.  Straight leg raise negative on the left it was positive on the right at about 80 degrees for low back pain.  Right total knee replacement appears to be fine.  The knee was not hot red or swollen.  No instability.  No localized areas of tenderness.  No pain with range of motion of her right hip.  Some percussible tenderness of the lumbar spine.  Good pulses distally.  No distal  edema.  Specialty Comments:  No specialty comments available.  Imaging: No results found.   PMFS History: Patient Active Problem List   Diagnosis Date Noted  . Vaginitis and vulvovaginitis 02/03/2018  . Lumbar disc disease with radiculopathy 12/15/2017  . Viral URI with cough 12/15/2017  . Gastroesophageal reflux disease 11/13/2017  . S/P AAA repair 11/13/2017  . Special screening for malignant neoplasms, colon 11/13/2017  . Epigastric pain 10/06/2017  . AAA (abdominal aortic aneurysm) ([place]) 02/05/2017  . Preoperative clearance 12/11/2016  . Primary osteoarthritis of right knee 01/23/2016  . S/P total knee replacement using cement 01/23/2016  . Peptic ulcer associated with Helicobacter pylori infection 05/03/2014  . Acute right hip pain 04/28/2014  . Pain of left leg 04/28/2014  . Abdominal pain, epigastric 04/28/2014  . AAA (abdominal aortic aneurysm) without rupture ([place]) 03/08/2013  . Left otitis media 10/08/2012  . Unilateral occipital headache 04/20/2012  . Left knee pain 11/19/2010  . SHOULDER PAIN, BILATERAL 07/17/2010  . WRIST PAIN, RIGHT 02/15/2010  . COPD (chronic obstructive pulmonary disease) ([place]) 08/01/2008  . Backache 11/26/2007  . HYPOTHYROIDISM, POST-RADIOACTIVE IODINE 07/20/2007  . TOBACCO ABUSE 07/20/2007  . HOT FLASHES 07/20/2007  . BENIGN POSITIONAL VERTIGO, HX OF 07/20/2007  . INFLAMMATORY DISEASE OF BREAST 07/19/2007  . FIBROCYSTIC BREAST DISEASE 04/01/2007   Past Medical History:  Diagnosis Date  . AAA (abdominal aortic aneurysm) ([place])   . Arthritis   . COPD (chronic obstructive pulmonary disease) ([place])   . Dyspnea    just recently started wheezing  . GERD (gastroesophageal reflux disease)   . Hyperlipidemia   . Hypothyroidism   . Thyroid disease   . Tobacco abuse   . Vertigo     Family History  Problem Relation Age of Onset  . Thyroid disease Other   . Diabetes Other   . Hyperlipidemia Other   . Hypertension Other     Past  Surgical History:  Procedure Laterality Date  . ABDOMINAL AORTIC ANEURYSM REPAIR N/A 02/05/2017   Procedure: ANEURYSM ABDOMINAL AORTIC REPAIR;  Surgeon: Elam Dutch, MD;  [facility]: [facility];  Service: Vascular;  Laterality: N/A;  . CESAREAN SECTION     x 3  . JOINT REPLACEMENT    . SHOULDER SURGERY  02/2015  . TOTAL KNEE ARTHROPLASTY Right 01/23/2016   Procedure: TOTAL KNEE ARTHROPLASTY;  Surgeon: Garald Balding, MD;  Location: [place];  Service: Orthopedics;  Laterality: Right;  . TUBAL LIGATION     Social History   Occupational History  . Occupation: Retired  Tobacco Use  . Smoking status: Former Smoker    Packs/day: 1.00    Years: 50.00    Pack years: 50.00    Types: Cigarettes    Last attempt to quit: 12/31/2016    Years since quitting: 1.1  . Smokeless tobacco: Never Used  Substance and  Sexual Activity  . Alcohol use: No    Alcohol/week: 0.0 oz  . Drug use: No  . Sexual activity: Not on file

## 2018-02-26 ENCOUNTER — Ambulatory Visit
Admission: RE | Admit: 2018-02-26 | Discharge: 2018-02-26 | Disposition: A | Discharge status: Home / Self Care | Payer: Medicare HMO | Source: Ambulatory Visit | Attending: Orthopaedic Surgery | Admitting: Orthopaedic Surgery

## 2018-02-26 DIAGNOSIS — M4807 Spinal stenosis, lumbosacral region: Secondary | ICD-10-CM

## 2018-02-26 DIAGNOSIS — M48061 Spinal stenosis, lumbar region without neurogenic claudication: Secondary | ICD-10-CM | POA: Diagnosis not present

## 2018-03-02 ENCOUNTER — Encounter: Payer: Self-pay | Admitting: Gastroenterology

## 2018-03-16 ENCOUNTER — Encounter (INDEPENDENT_AMBULATORY_CARE_PROVIDER_SITE_OTHER): Payer: Self-pay | Admitting: Orthopaedic Surgery

## 2018-03-16 ENCOUNTER — Ambulatory Visit (INDEPENDENT_AMBULATORY_CARE_PROVIDER_SITE_OTHER): Payer: Medicare HMO | Admitting: Orthopaedic Surgery

## 2018-03-16 ENCOUNTER — Other Ambulatory Visit (INDEPENDENT_AMBULATORY_CARE_PROVIDER_SITE_OTHER): Payer: Self-pay | Admitting: Radiology

## 2018-03-16 VITALS — BP 120/88 | HR 76 | Ht 65.0 in | Wt 160.0 lb

## 2018-03-16 DIAGNOSIS — M5116 Intervertebral disc disorders with radiculopathy, lumbar region: Secondary | ICD-10-CM

## 2018-03-16 MED ORDER — PREDNISONE 5 MG PO TABS: ORAL_TABLET | ORAL | 0 refills | Status: DC

## 2018-03-16 NOTE — Progress Notes (Signed)
Office Visit Note   Patient: Stacy Garcia           Date of Birth: Dec 14, 1950           MRN: [id number] Visit Date: 03/16/2018              Requested by: Stacy Peng, NP [place],  [id number] PCP: Stacy Peng, NP   Assessment & Plan: Visit Diagnoses:  1. Lumbar disc disease with radiculopathy     Plan: MRI scan demonstrates potentially symptomatic right-sided neural impingement at L3-4 and L4-5 related to protruding disks.  He also has central and leftward protrusions at L1-1 L2-3 that could contribute to left-sided symptoms.  Patient is asymptomatic on the left.  Long discussion with Stacy Garcia regarding the scan results.  She is "deathly afraid of needles" and would like to avoid epidural steroid if possible.  We will try a Medrol Dosepak and reevaluate in 2 weeks.  Would also consider a course of physical therapy  Follow-Up Instructions: Return in about 2 weeks (around 03/30/2018).   Orders:  No orders of the defined types were placed in this encounter.  No orders of the defined types were placed in this encounter.     Procedures: No procedures performed   Clinical Data: No additional findings.   Subjective: Chief Complaint  Patient presents with  . Follow-up    MRI REPORT L SPINE  No change in symptoms with predominantly right-sided low back pain and discomfort referred as far distally as the right lateral calf.  HPI  Review of Systems  Constitutional: Positive for fatigue. Negative for fever.  HENT: Negative for ear pain.   Eyes: Negative for pain.  Respiratory: Negative for cough and shortness of breath.   Cardiovascular: Positive for leg swelling.  Gastrointestinal: Negative for constipation and diarrhea.  Genitourinary: Negative for difficulty urinating.  Musculoskeletal: Positive for back pain. Negative for neck pain.  Skin: Negative for rash.  Allergic/Immunologic: Negative for food allergies.  Neurological: Positive  for weakness. Negative for numbness.  Hematological: Bruises/bleeds easily.  Psychiatric/Behavioral: Positive for sleep disturbance.     Objective: Vital Signs: BP 120/88 (BP Location: Left Arm, Patient Position: Sitting, Cuff Size: Normal)   Pulse 76   Ht 5\' 5"  (1.651 m)   Wt 160 lb (72.6 kg)   BMI 26.63 kg/m   Physical Exam  Constitutional: She is oriented to person, place, and time. She appears well-developed and well-nourished.  HENT:  Mouth/Throat: Oropharynx is clear and moist.  Eyes: Pupils are equal, round, and reactive to light. EOM are normal.  Pulmonary/Chest: Effort normal.  Neurological: She is alert and oriented to person, place, and time.  Skin: Skin is warm and dry.  Psychiatric: She has a normal mood and affect. Her behavior is normal.    Ortho Exam awake alert and oriented x3.  Comfortable sitting.  Does not walk with a limp.  Straight leg raise negative bilaterally.  Motor exam and sensory exam intact.  Decreased right knee reflex.  Has had prior right knee replacement without problem.  No pain with motion both hips.  No percussible tenderness of lumbar spine.  Specialty Comments:  No specialty comments available.  Imaging: No results found.   PMFS History: Patient Active Problem List   Diagnosis Date Noted  . Vaginitis and vulvovaginitis 02/03/2018  . Lumbar disc disease with radiculopathy 12/15/2017  . Viral URI with cough 12/15/2017  . Gastroesophageal reflux disease 11/13/2017  . S/P AAA repair  11/13/2017  . Special screening for malignant neoplasms, colon 11/13/2017  . Epigastric pain 10/06/2017  . AAA (abdominal aortic aneurysm) ([place]) 02/05/2017  . Preoperative clearance 12/11/2016  . Primary osteoarthritis of right knee 01/23/2016  . S/P total knee replacement using cement 01/23/2016  . Peptic ulcer associated with Helicobacter pylori infection 05/03/2014  . Acute right hip pain 04/28/2014  . Pain of left leg 04/28/2014  . Abdominal pain,  epigastric 04/28/2014  . AAA (abdominal aortic aneurysm) without rupture ([place]) 03/08/2013  . Left otitis media 10/08/2012  . Unilateral occipital headache 04/20/2012  . Left knee pain 11/19/2010  . SHOULDER PAIN, BILATERAL 07/17/2010  . WRIST PAIN, RIGHT 02/15/2010  . COPD (chronic obstructive pulmonary disease) ([place]) 08/01/2008  . Backache 11/26/2007  . HYPOTHYROIDISM, POST-RADIOACTIVE IODINE 07/20/2007  . TOBACCO ABUSE 07/20/2007  . HOT FLASHES 07/20/2007  . BENIGN POSITIONAL VERTIGO, HX OF 07/20/2007  . INFLAMMATORY DISEASE OF BREAST 07/19/2007  . FIBROCYSTIC BREAST DISEASE 04/01/2007   Past Medical History:  Diagnosis Date  . AAA (abdominal aortic aneurysm) ([place])   . Arthritis   . COPD (chronic obstructive pulmonary disease) ([place])   . Dyspnea    just recently started wheezing  . GERD (gastroesophageal reflux disease)   . Hyperlipidemia   . Hypothyroidism   . Thyroid disease   . Tobacco abuse   . Vertigo     Family History  Problem Relation Age of Onset  . Thyroid disease Other   . Diabetes Other   . Hyperlipidemia Other   . Hypertension Other     Past Surgical History:  Procedure Laterality Date  . ABDOMINAL AORTIC ANEURYSM REPAIR N/A 02/05/2017   Procedure: ANEURYSM ABDOMINAL AORTIC REPAIR;  Surgeon: Stacy Dutch, MD;  Location: [facility];  Service: Vascular;  Laterality: N/A;  . CESAREAN SECTION     x 3  . JOINT REPLACEMENT    . SHOULDER SURGERY  02/2015  . TOTAL KNEE ARTHROPLASTY Right 01/23/2016   Procedure: TOTAL KNEE ARTHROPLASTY;  Surgeon: Stacy Balding, MD;  Location: [place];  Service: Orthopedics;  Laterality: Right;  . TUBAL LIGATION     Social History   Occupational History  . Occupation: Retired  Tobacco Use  . Smoking status: Former Smoker    Packs/day: 1.00    Years: 50.00    Pack years: 50.00    Types: Cigarettes    Last attempt to quit: 12/31/2016    Years since quitting: 1.2  . Smokeless tobacco: Never Used  Substance and Sexual  Activity  . Alcohol use: No    Alcohol/week: 0.0 oz  . Drug use: No  . Sexual activity: Not on file

## 2018-03-19 ENCOUNTER — Ambulatory Visit (INDEPENDENT_AMBULATORY_CARE_PROVIDER_SITE_OTHER): Payer: Medicare HMO | Admitting: Vascular Surgery

## 2018-03-19 ENCOUNTER — Encounter: Payer: Self-pay | Admitting: Vascular Surgery

## 2018-03-19 ENCOUNTER — Other Ambulatory Visit: Payer: Self-pay

## 2018-03-19 VITALS — BP 131/87 | HR 67 | Temp 97.0°F | Resp 18 | Ht 65.0 in | Wt 159.3 lb

## 2018-03-19 DIAGNOSIS — I714 Abdominal aortic aneurysm, without rupture, unspecified: Secondary | ICD-10-CM

## 2018-03-19 NOTE — Progress Notes (Signed)
Patient is a 67 year old female who returns for follow-up today.  She underwent open infrarenal abdominal aortic aneurysm repair with a tube graft May 2018.  She has chronic back pain.  She denies any new abdominal pain.  She has no bulges in her incision.  Overall she feels well.  Review of systems: She denies shortness of breath.  She denies chest pain.  Past Medical History:  Diagnosis Date  . AAA (abdominal aortic aneurysm) ([place])   . Arthritis   . COPD (chronic obstructive pulmonary disease) ([place])   . Dyspnea    just recently started wheezing  . GERD (gastroesophageal reflux disease)   . Hyperlipidemia   . Hypothyroidism   . Thyroid disease   . Tobacco abuse   . Vertigo     Past Surgical History:  Procedure Laterality Date  . ABDOMINAL AORTIC ANEURYSM REPAIR N/A 02/05/2017   Procedure: ANEURYSM ABDOMINAL AORTIC REPAIR;  Surgeon: Elam Dutch, MD;  Location: [facility] OR;  Service: Vascular;  Laterality: N/A;  . CESAREAN SECTION     x 3  . JOINT REPLACEMENT    . SHOULDER SURGERY  02/2015  . TOTAL KNEE ARTHROPLASTY Right 01/23/2016   Procedure: TOTAL KNEE ARTHROPLASTY;  Surgeon: Garald Balding, MD;  Location: [place];  Service: Orthopedics;  Laterality: Right;  . TUBAL LIGATION     Current Outpatient Medications on File Prior to Visit  Medication Sig Dispense Refill  . aspirin EC 81 MG tablet Take 81 mg by mouth at bedtime.     . diazepam (VALIUM) 5 MG tablet TAKE 1/2 TO 1 TABLET TWICE A DAY AS NEEDED 30 tablet 2  . levothyroxine (SYNTHROID, LEVOTHROID) 88 MCG tablet Take 1 tablet (88 mcg total) by mouth daily. 90 tablet 3  . omeprazole (PRILOSEC) 40 MG capsule Take 40 mg twice daily for one month ,then decrease to once daily 90 capsule 11  . simvastatin (ZOCOR) 40 MG tablet TAKE 1 TABLET AT BEDTIME 90 tablet 3  . amoxicillin (AMOXIL) 500 MG tablet Take 2 tablets (1,000 mg total) by mouth 2 (two) times daily. (Patient not taking: Reported on 03/19/2018) 56 tablet 0  .  clarithromycin (BIAXIN) 500 MG tablet Take 1 tablet (500 mg total) by mouth 2 (two) times daily. (Patient not taking: Reported on 03/19/2018) 28 tablet 0  . conjugated estrogens (PREMARIN) vaginal cream Place 1 Applicatorful vaginally daily. (Patient not taking: Reported on 03/19/2018) 60 g 12  . dicyclomine (BENTYL) 20 MG tablet TAKE 1 TABLET BY MOUTH TWICE A DAY (Patient not taking: Reported on 03/19/2018) 60 tablet 2  . fluconazole (DIFLUCAN) 150 MG tablet 1 tablet every third day for 3 doses (Patient not taking: Reported on 03/19/2018) 3 tablet 2  . HYDROcodone-homatropine (HYCODAN) 5-1.5 MG/5ML syrup 1/2 teaspoon 3 times a day when necessary for cough (Patient not taking: Reported on 03/19/2018) 120 mL 0  . meloxicam (MOBIC) 7.5 MG tablet Take 1 tablet (7.5 mg total) by mouth daily. (Patient not taking: Reported on 03/19/2018) 30 tablet 0  . metroNIDAZOLE (FLAGYL) 500 MG tablet Take 1 tablet (500 mg total) by mouth 2 (two) times daily. (Patient not taking: Reported on 03/19/2018) 28 tablet 0  . predniSONE (DELTASONE) 20 MG tablet 2 tabs x 3 days, 1 tab x 3 days, 1/2 tab x 3 days, 1/2 tab M,W,F x 2 weeks (Patient not taking: Reported on 03/19/2018) 40 tablet 1  . predniSONE (DELTASONE) 5 MG tablet Day 1 take 6 tabs, day 2 take 5 tabs, day  3 take 4 tabs, day 4 take 3 tabs, day 5 take 2 tabs day 6 take 1 tab (Patient not taking: Reported on 03/19/2018) 21 tablet 0   Current Facility-Administered Medications on File Prior to Visit  Medication Dose Route Frequency Provider Last Rate Last Dose  . 0.9 %  sodium chloride infusion  500 mL Intravenous Once Armbruster, Carlota Raspberry, MD        Physical exam:  Vitals:   03/19/18 1134  BP: 131/87  Pulse: 67  Resp: 18  Temp: (!) 97 F (36.1 C)  TempSrc: Oral  SpO2: 97%  Weight: 159 lb 4.8 oz (72.3 kg)  Height: 5\' 5"  (1.651 m)    Neck: No carotid bruits  Chest: Clear to auscultation bilaterally  Cardiac: Regular rate and rhythm  Abdomen: Soft nontender  nondistended well-healed midline laparotomy no hernia 2+ femoral pulses bilaterally  Extremities: 2+ dorsalis pedis pulses bilaterally  Assessment: Patient status post open abdominal aortic aneurysm repair doing well overall.  Plan: The patient probably should have a repeat CT scan in about 10 years to make sure she does not develop perianastomotic aneurysms.  Otherwise she is doing well.  Ruta Hinds, MD [facility] Office: [phone] Pager: [phone]

## 2018-03-30 ENCOUNTER — Ambulatory Visit (INDEPENDENT_AMBULATORY_CARE_PROVIDER_SITE_OTHER): Payer: Medicare HMO | Admitting: Orthopaedic Surgery

## 2018-04-24 ENCOUNTER — Other Ambulatory Visit: Payer: Self-pay | Admitting: Adult Health

## 2018-04-28 NOTE — Telephone Encounter (Signed)
Last fill 01/09/18  Last OV with you 06/19/17,  Ok to fill?

## 2018-05-19 ENCOUNTER — Other Ambulatory Visit: Payer: Self-pay | Admitting: Adult Health

## 2018-05-19 DIAGNOSIS — E785 Hyperlipidemia, unspecified: Secondary | ICD-10-CM

## 2018-05-20 NOTE — Telephone Encounter (Signed)
Sent to the pharmacy by e-scribe. 

## 2018-06-01 ENCOUNTER — Encounter: Payer: Self-pay | Admitting: Family Medicine

## 2018-06-01 ENCOUNTER — Ambulatory Visit (INDEPENDENT_AMBULATORY_CARE_PROVIDER_SITE_OTHER): Payer: Medicare HMO | Admitting: Family Medicine

## 2018-06-01 VITALS — BP 110/80 | HR 94 | Temp 98.2°F | Wt 162.3 lb

## 2018-06-01 DIAGNOSIS — J441 Chronic obstructive pulmonary disease with (acute) exacerbation: Secondary | ICD-10-CM | POA: Diagnosis not present

## 2018-06-01 MED ORDER — DOXYCYCLINE HYCLATE 100 MG PO TABS: 100.0000 mg | ORAL_TABLET | Freq: Two times a day (BID) | ORAL | 0 refills | Status: DC

## 2018-06-01 MED ORDER — PREDNISONE 20 MG PO TABS
ORAL_TABLET | ORAL | 1 refills | Status: DC
Start: 2018-06-01 — End: 2018-07-01

## 2018-06-01 MED ORDER — HYDROCODONE-HOMATROPINE 5-1.5 MG/5ML PO SYRP: ORAL_SOLUTION | ORAL | 0 refills | Status: DC

## 2018-06-01 NOTE — Patient Instructions (Signed)
Prednisone 20 mg.......... 2 now...Marland KitchenMarland KitchenMarland Kitchen then starting tomorrow morning 2 tabs daily until you feel much improved........... then taper as outlined  Hydromet.........Marland Kitchen 1/2 teaspoon 3 times daily as needed for cough  Doxycycline 100 mg...Marland KitchenMarland KitchenMarland Kitchen 1 twice daily until bottle empty  Call pulmonary and set up an evaluation.

## 2018-06-01 NOTE — Progress Notes (Signed)
Stacy Garcia is a 67 year old married female ex smoker......... x1-1/2 years.......... who comes in today for exacerbation of her COPD  She states she felt well until about a week ago when she began coughing.  She had a mild sore throat.  She has not been in contact with anybody that is been ill.  She had no fever chills nausea vomiting diarrhea etc.  The cough is gotten worse or shortness of breath is gotten worse.  She had a complete evaluation in pulmonary.  She is placed on inhalers to use on a daily basis but she ran out and never got them refilled.  BP 110/80 (BP Location: Right Arm, Patient Position: Sitting, Cuff Size: Large)   Pulse 94   Temp 98.2 F (36.8 C) (Oral)   Wt 162 lb 4.8 oz (73.6 kg)   SpO2 95%   BMI 27.01 kg/m  Well-developed well-nourished female no acute distress vital signs stable she is afebrile pulse ox on room air is 94%  HEENT were negative neck was supple no adenopathy.  Pulmonary exam shows barely audible breath sounds bilaterally no crackles  1.  COPD with exacerbation........ short course of oral prednisone...Marland KitchenMarland KitchenMarland Kitchen cover with doxycycline......Marland Kitchen reevaluation and pulmonary

## 2018-06-03 ENCOUNTER — Ambulatory Visit: Payer: Medicare HMO | Admitting: Nurse Practitioner

## 2018-06-03 ENCOUNTER — Other Ambulatory Visit: Payer: Medicare HMO

## 2018-06-03 ENCOUNTER — Encounter: Payer: Self-pay | Admitting: Nurse Practitioner

## 2018-06-03 VITALS — BP 122/68 | HR 78 | Ht 64.5 in | Wt 162.0 lb

## 2018-06-03 DIAGNOSIS — R319 Hematuria, unspecified: Secondary | ICD-10-CM | POA: Diagnosis not present

## 2018-06-03 DIAGNOSIS — J441 Chronic obstructive pulmonary disease with (acute) exacerbation: Secondary | ICD-10-CM

## 2018-06-03 DIAGNOSIS — J432 Centrilobular emphysema: Secondary | ICD-10-CM | POA: Diagnosis not present

## 2018-06-03 MED ORDER — GLYCOPYRROLATE-FORMOTEROL 9-4.8 MCG/ACT IN AERO: 2.0000 | INHALATION_SPRAY | Freq: Two times a day (BID) | RESPIRATORY_TRACT | 0 refills | Status: DC

## 2018-06-03 NOTE — Patient Instructions (Addendum)
Will restart Bevespi to take 2 puffs twice daily - samples given Finish entire course of doxycycline Finish prednisone taper Make take mucinex Will order A1AT Will consider lung cancer screening Follow up with Dr. Halford Chessman in 1 month or sooner if needed

## 2018-06-03 NOTE — Progress Notes (Signed)
@Patient  ID: Stacy Garcia, female    DOB: December 15, 1950, 67 y.o.   MRN: [id number]  Chief Complaint  Patient presents with  . Cough    w/yellow congestion    Referring provider: [name], NP   HPI 67 year old female former smoker with COPD/emphysema followed by Dr. Halford Chessman.   Tests: PFT 10/23/15 >> FEV1 1.25 (50%), FEV1% 69, TLC 4.54 (87%), DLCO 52%, mild  BD response (11%)   OV 06/03/18 - acute cough Patient presents for a cough that started around a week ago. Cough has progressively worsened. She saw her PCP on 06/01/18 and was prescribed doxycycline, prednisone taper, and cough syrup. She states that she has had some improvement since starting these medications. She was prescribed bevespi, but states that she lost her inhaler and could not afford a new one. She has not taken the inhaler in months. She denies any shortness of breath, fever, or peripheral edema. At last visit with Dr. Halford Chessman it was recommended that she have a lung cancer screening and A1AT check, but she did not follow up on this.   Allergies  Allergen Reactions  . No Known Allergies     Immunization History  Administered Date(s) Administered  . Influenza Whole 07/20/2007, 07/17/2010  . Influenza, High Dose Seasonal PF 06/19/2017  . Influenza,inj,Quad PF,6+ Mos 09/01/2013, 10/26/2015  . Pneumococcal Conjugate-13 06/19/2017  . Pneumococcal Polysaccharide-23 08/01/2008, 10/26/2015  . Td 09/09/2001  . Tdap 02/24/2012    Past Medical History:  Diagnosis Date  . AAA (abdominal aortic aneurysm) ([place])   . Arthritis   . COPD (chronic obstructive pulmonary disease) ([place])   . Dyspnea    just recently started wheezing  . GERD (gastroesophageal reflux disease)   . Hyperlipidemia   . Hypothyroidism   . Thyroid disease   . Tobacco abuse   . Vertigo     Tobacco History: Social History   Tobacco Use  Smoking Status Former Smoker  . Packs/day: 1.00  . Years: 50.00  . Pack years: 50.00  . Types: Cigarettes    . Last attempt to quit: 12/31/2016  . Years since quitting: 1.4  Smokeless Tobacco Never Used   Counseling given: Yes   Outpatient Encounter Medications as of 06/03/2018  Medication Sig  . aspirin EC 81 MG tablet Take 81 mg by mouth at bedtime.   . diazepam (VALIUM) 5 MG tablet TAKE 1/2 TO 1 TABLET TWICE A DAY AS NEEDED  . dicyclomine (BENTYL) 20 MG tablet TAKE 1 TABLET BY MOUTH TWICE A DAY (Patient taking differently: as needed. )  . doxycycline (VIBRA-TABS) 100 MG tablet Take 1 tablet (100 mg total) by mouth 2 (two) times daily.  Marland Kitchen HYDROcodone-homatropine (HYCODAN) 5-1.5 MG/5ML syrup 1/2 teaspoon 3 times a day when necessary for cough  . levothyroxine (SYNTHROID, LEVOTHROID) 88 MCG tablet Take 1 tablet (88 mcg total) by mouth daily.  Marland Kitchen omeprazole (PRILOSEC) 40 MG capsule Take 40 mg twice daily for one month ,then decrease to once daily  . predniSONE (DELTASONE) 20 MG tablet 2 tabs x 3 days, 1 tab x 3 days, 1/2 tab x 3 days, 1/2 tab M,W,F x 2 weeks  . predniSONE (DELTASONE) 20 MG tablet 2 tabs x 3 days, 1 tab x 3 days, 1/2 tab x 3 days, 1/2 tab M,W,F x 2 weeks  . simvastatin (ZOCOR) 40 MG tablet TAKE 1 TABLET AT BEDTIME  . Glycopyrrolate-Formoterol (BEVESPI AEROSPHERE) 9-4.8 MCG/ACT AERO Inhale 2 puffs into the lungs 2 (two) times daily.  . [  DISCONTINUED] amoxicillin (AMOXIL) 500 MG tablet Take 2 tablets (1,000 mg total) by mouth 2 (two) times daily. (Patient not taking: Reported on 06/03/2018)  . [DISCONTINUED] clarithromycin (BIAXIN) 500 MG tablet Take 1 tablet (500 mg total) by mouth 2 (two) times daily. (Patient not taking: Reported on 06/03/2018)  . [DISCONTINUED] conjugated estrogens (PREMARIN) vaginal cream Place 1 Applicatorful vaginally daily. (Patient not taking: Reported on 06/03/2018)  . [DISCONTINUED] HYDROcodone-homatropine (HYCODAN) 5-1.5 MG/5ML syrup 1/2 teaspoon 3 times daily as needed for cough (Patient not taking: Reported on 06/03/2018)  . [DISCONTINUED] predniSONE  (DELTASONE) 5 MG tablet Day 1 take 6 tabs, day 2 take 5 tabs, day 3 take 4 tabs, day 4 take 3 tabs, day 5 take 2 tabs day 6 take 1 tab (Patient not taking: Reported on 06/03/2018)   Facility-Administered Encounter Medications as of 06/03/2018  Medication  . 0.9 %  sodium chloride infusion     Review of Systems  Review of Systems  Constitutional: Negative.  Negative for chills and fever.  HENT: Negative.  Negative for congestion.   Respiratory: Positive for shortness of breath. Negative for cough.   Cardiovascular: Positive for chest pain. Negative for palpitations and leg swelling.  Gastrointestinal: Negative.   Allergic/Immunologic: Negative.   Neurological: Negative.   Psychiatric/Behavioral: Negative.        Physical Exam  BP 122/68 (BP Location: Left Arm, Patient Position: Sitting, Cuff Size: Normal)   Pulse 78   Ht 5' 4.5" (1.638 m)   Wt 162 lb (73.5 kg)   SpO2 98%   BMI 27.38 kg/m   Wt Readings from Last 5 Encounters:  06/03/18 162 lb (73.5 kg)  06/01/18 162 lb 4.8 oz (73.6 kg)  03/19/18 159 lb 4.8 oz (72.3 kg)  03/16/18 160 lb (72.6 kg)  02/10/18 160 lb (72.6 kg)     Physical Exam  Constitutional: She is oriented to person, place, and time. She appears well-developed and well-nourished. No distress.  Cardiovascular: Normal rate and regular rhythm.  Pulmonary/Chest: Effort normal and breath sounds normal.  Neurological: She is alert and oriented to person, place, and time.  Psychiatric: She has a normal mood and affect.  Nursing note and vitals reviewed.     Assessment & Plan:   Hematuria Note: Patient noticed hematuria when he used the restroom during office visit today. Urine checked - will call with results  COPD exacerbation [facility]) Patient Instructions  Will restart Bevespi to take 2 puffs twice daily - samples given Finish entire course of doxycycline Finish prednisone taper Make take mucinex Will order A1AT Will consider lung cancer  screening Follow up with Dr. Halford Chessman in 1 month or sooner if needed         Fenton Foy, NP 06/03/2018

## 2018-06-03 NOTE — Assessment & Plan Note (Signed)
Note: Patient noticed hematuria when he used the restroom during office visit today. Urine checked - will call with results

## 2018-06-03 NOTE — Assessment & Plan Note (Signed)
Patient Instructions  Will restart Bevespi to take 2 puffs twice daily - samples given Finish entire course of doxycycline Finish prednisone taper Make take mucinex Will order A1AT Will consider lung cancer screening Follow up with Dr. Halford Chessman in 1 month or sooner if needed

## 2018-06-09 LAB — ALPHA-1 ANTITRYPSIN PHENOTYPE: A-1 Antitrypsin, Ser: 140 mg/dL (ref 83–199)

## 2018-06-09 NOTE — Progress Notes (Signed)
Reviewed and agree with assessment/plan.   Donovan Persley, MD [facility] 09/04/2016, 12:24 PM Pager:  [phone]  

## 2018-07-01 ENCOUNTER — Ambulatory Visit: Payer: Medicare HMO | Admitting: Pulmonary Disease

## 2018-07-01 ENCOUNTER — Encounter: Payer: Self-pay | Admitting: Pulmonary Disease

## 2018-07-01 VITALS — BP 122/78 | HR 86 | Ht 64.0 in | Wt 164.0 lb

## 2018-07-01 DIAGNOSIS — J432 Centrilobular emphysema: Secondary | ICD-10-CM

## 2018-07-01 DIAGNOSIS — Z23 Encounter for immunization: Secondary | ICD-10-CM

## 2018-07-01 MED ORDER — ALBUTEROL SULFATE HFA 108 (90 BASE) MCG/ACT IN AERS
2.0000 | INHALATION_SPRAY | Freq: Four times a day (QID) | RESPIRATORY_TRACT | 5 refills | Status: DC | PRN
Start: 2018-07-01 — End: 2020-03-08

## 2018-07-01 MED ORDER — FLUTICASONE-UMECLIDIN-VILANT 100-62.5-25 MCG/INH IN AEPB: 1.0000 | INHALATION_SPRAY | Freq: Every day | RESPIRATORY_TRACT | 5 refills | Status: DC

## 2018-07-01 NOTE — Patient Instructions (Signed)
High dose flu shot today Trelegy one puff daily, and rinse your mouth after each use Ventolin two puffs every 6 hours as needed for cough, wheeze, shortness of breath, or chest congestion Stop using bevespi  Follow up in 4 weeks with Dr. Halford Chessman or Nurse Practitioner

## 2018-07-01 NOTE — Progress Notes (Signed)
[facility]  Chief Complaint  Patient presents with  . Follow-up    Pt has increase SOB and wheezing with some chest tightness in last month. Pt feels the Charolotte Eke is not helping her, she uses it PRN    Constitutional:  BP 122/78 (BP Location: Left Arm, Cuff Size: Normal)   Pulse 86   Ht 5\' 4"  (1.626 m)   Wt 164 lb (74.4 kg)   SpO2 96%   BMI 28.15 kg/m   Past Medical History:  AAA, GERD, HLD, Hypothyroidism, Vertigo  Brief Summary:  Stacy Garcia is a 67 y.o. female former smoker with COPD and emphysema.  She was restarted on bevespi.  Has been using this some.  Doesn't feel like it is making a difference.  No other inhalers.  Gets winded easily.  Has cough and wheeze.  Sometimes brings up clear sputum.  Not having fever, chest pain, sinus congestion, sore throat, gland swelling, hemoptysis, abdominal pain, skin rash, joint swelling or leg swelling.  She does not smoke cigarettes anymore.  Physical Exam:   Appearance - well kempt  ENMT - clear nasal mucosa, midline nasal septum, no oral exudates, no LAN, trachea midline, wears dentures Respiratory - normal chest wall, normal respiratory effort, no accessory muscle use, decreased breath sounds, no wheeze/rales CV - s1s2 regular rate and rhythm, no murmurs, no peripheral edema, radial pulses symmetric GI - soft, non tender, no masses Lymph - no adenopathy noted in neck and axillary areas MSK - normal muscle strength and tone, normal gait Ext - no cyanosis, clubbing, or joint inflammation noted Skin - no rashes, lesions, or ulcers Neuro - oriented to person, place, and time Psych - normal mood and affect   Assessment/Plan:   COPD with emphysema. - no improvement with bevespi - will try changing her to trelegy with prn ventolin - high dose flu shot today - if breathing symptoms persist, then needs additional testing >> PFT, labs, CT chest, Echo   Patient Instructions  High  dose flu shot today Trelegy one puff daily, and rinse your mouth after each use Ventolin two puffs every 6 hours as needed for cough, wheeze, shortness of breath, or chest congestion Stop using bevespi  Follow up in 4 weeks with Dr. Halford Chessman or Nurse Practitioner     Chesley Mires, MD [place] Pager: [phone] 07/01/2018, 10:35 AM  Flow Sheet     Pulmonary tests:  PFT 10/23/15 >> FEV1 1.25 (50%), FEV1% 69, TLC 4.54 (87%), DLCO 52%, no BD   Medications:   Allergies as of 07/01/2018      Reactions   No Known Allergies       Medication List        Accurate as of 07/01/18 10:35 AM. Always use your most recent med list.          albuterol 108 (90 Base) MCG/ACT inhaler Commonly known as:  PROVENTIL HFA;VENTOLIN HFA Inhale 2 puffs into the lungs every 6 (six) hours as needed for wheezing or shortness of breath.   aspirin EC 81 MG tablet Take 81 mg by mouth at bedtime.   diazepam 5 MG tablet Commonly known as:  VALIUM TAKE 1/2 TO 1 TABLET TWICE A DAY AS NEEDED   dicyclomine 20 MG tablet Commonly known as:  BENTYL TAKE 1 TABLET BY MOUTH TWICE A DAY   Fluticasone-Umeclidin-Vilant 100-62.5-25 MCG/INH Aepb Inhale 1 puff into the lungs daily.   levothyroxine 88 MCG tablet Commonly known  as:  SYNTHROID, LEVOTHROID Take 1 tablet (88 mcg total) by mouth daily.   omeprazole 40 MG capsule Commonly known as:  PRILOSEC Take 40 mg twice daily for one month ,then decrease to once daily   simvastatin 40 MG tablet Commonly known as:  ZOCOR TAKE 1 TABLET AT BEDTIME       Past Surgical History:  She  has a past surgical history that includes Cesarean section; Shoulder surgery (02/2015); Total knee arthroplasty (Right, 01/23/2016); Joint replacement; Tubal ligation; and Abdominal aortic aneurysm repair (N/A, 02/05/2017).  Family History:  Her family history includes Diabetes in her other; Hyperlipidemia in her other; Hypertension in her other; Thyroid  disease in her other.  Social History:  She  reports that she quit smoking about 17 months ago. Her smoking use included cigarettes. She has a 50.00 pack-year smoking history. She has never used smokeless tobacco. She reports that she does not drink alcohol or use drugs.

## 2018-07-13 ENCOUNTER — Telehealth (INDEPENDENT_AMBULATORY_CARE_PROVIDER_SITE_OTHER): Payer: Self-pay | Admitting: Orthopaedic Surgery

## 2018-07-13 NOTE — Telephone Encounter (Signed)
You may write the letter

## 2018-07-13 NOTE — Telephone Encounter (Signed)
Patient called stating she had a right TKR with Dr. Durward Fortes approximately 2 years ago and is requesting a letter that she can bring to the courthouse so she can walk through the metal dectector.  Patient requested a return call when the letter is ready and she will come by and pick it up.

## 2018-07-13 NOTE — Telephone Encounter (Signed)
DOSE PT NEED LETTER AND IF SO MAY I WRITE HER ONE?

## 2018-07-14 ENCOUNTER — Encounter: Payer: Self-pay | Admitting: Radiology

## 2018-07-14 NOTE — Telephone Encounter (Signed)
WROTE LETTER AND NOTIFIED PT

## 2018-07-22 ENCOUNTER — Other Ambulatory Visit: Payer: Self-pay | Admitting: Adult Health

## 2018-07-22 DIAGNOSIS — E034 Atrophy of thyroid (acquired): Secondary | ICD-10-CM

## 2018-07-22 NOTE — Telephone Encounter (Signed)
Sent to the pharmacy by e-scribe for 90 days.  I have scheduled the pt for cpx on 09/15/18.  No further action required.

## 2018-07-23 ENCOUNTER — Other Ambulatory Visit: Payer: Self-pay | Admitting: Adult Health

## 2018-07-23 ENCOUNTER — Ambulatory Visit (INDEPENDENT_AMBULATORY_CARE_PROVIDER_SITE_OTHER): Payer: Medicare HMO | Admitting: Orthopaedic Surgery

## 2018-07-23 DIAGNOSIS — E785 Hyperlipidemia, unspecified: Secondary | ICD-10-CM

## 2018-07-24 ENCOUNTER — Ambulatory Visit (INDEPENDENT_AMBULATORY_CARE_PROVIDER_SITE_OTHER): Payer: Medicare HMO | Admitting: Orthopaedic Surgery

## 2018-07-24 NOTE — Telephone Encounter (Signed)
Sent to the pharmacy by e-scribe.  Pt has cpx scheduled for 09/15/18.

## 2018-07-29 ENCOUNTER — Ambulatory Visit: Payer: Medicare HMO | Admitting: Pulmonary Disease

## 2018-08-10 ENCOUNTER — Ambulatory Visit: Payer: 59 | Admitting: Pulmonary Disease

## 2018-09-15 ENCOUNTER — Encounter: Payer: Self-pay | Admitting: Adult Health

## 2018-09-15 ENCOUNTER — Ambulatory Visit (INDEPENDENT_AMBULATORY_CARE_PROVIDER_SITE_OTHER): Payer: Medicare HMO | Admitting: Adult Health

## 2018-09-15 VITALS — BP 130/80 | Temp 97.4°F | Ht 64.5 in | Wt 168.0 lb

## 2018-09-15 DIAGNOSIS — Z0000 Encounter for general adult medical examination without abnormal findings: Secondary | ICD-10-CM | POA: Diagnosis not present

## 2018-09-15 DIAGNOSIS — E018 Other iodine-deficiency related thyroid disorders and allied conditions: Secondary | ICD-10-CM

## 2018-09-15 DIAGNOSIS — F17200 Nicotine dependence, unspecified, uncomplicated: Secondary | ICD-10-CM

## 2018-09-15 DIAGNOSIS — J439 Emphysema, unspecified: Secondary | ICD-10-CM | POA: Diagnosis not present

## 2018-09-15 DIAGNOSIS — E782 Mixed hyperlipidemia: Secondary | ICD-10-CM | POA: Diagnosis not present

## 2018-09-15 LAB — CBC WITH DIFFERENTIAL/PLATELET
BASOS ABS: 0 10*3/uL (ref 0.0–0.1)
Basophils Relative: 0.4 % (ref 0.0–3.0)
EOS ABS: 0.2 10*3/uL (ref 0.0–0.7)
Eosinophils Relative: 2.5 % (ref 0.0–5.0)
HEMATOCRIT: 48.9 % — AB (ref 36.0–46.0)
HEMOGLOBIN: 16.5 g/dL — AB (ref 12.0–15.0)
LYMPHS PCT: 31.8 % (ref 12.0–46.0)
Lymphs Abs: 2.4 10*3/uL (ref 0.7–4.0)
MCHC: 33.7 g/dL (ref 30.0–36.0)
MCV: 93 fl (ref 78.0–100.0)
MONO ABS: 0.5 10*3/uL (ref 0.1–1.0)
Monocytes Relative: 6.2 % (ref 3.0–12.0)
Neutro Abs: 4.5 10*3/uL (ref 1.4–7.7)
Neutrophils Relative %: 59.1 % (ref 43.0–77.0)
Platelets: 233 10*3/uL (ref 150.0–400.0)
RBC: 5.26 Mil/uL — ABNORMAL HIGH (ref 3.87–5.11)
RDW: 13.8 % (ref 11.5–15.5)
WBC: 7.6 10*3/uL (ref 4.0–10.5)

## 2018-09-15 LAB — LIPID PANEL
CHOL/HDL RATIO: 5
CHOLESTEROL: 212 mg/dL — AB (ref 0–200)
HDL: 46.2 mg/dL (ref >39.00)
NONHDL: 166.23
Triglycerides: 243 mg/dL — ABNORMAL HIGH (ref 0.0–149.0)
VLDL: 48.6 mg/dL — ABNORMAL HIGH (ref 0.0–40.0)

## 2018-09-15 LAB — COMPREHENSIVE METABOLIC PANEL
ALBUMIN: 4.3 g/dL (ref 3.5–5.2)
ALT: 17 U/L (ref 0–35)
AST: 14 U/L (ref 0–37)
Alkaline Phosphatase: 80 U/L (ref 39–117)
BILIRUBIN TOTAL: 0.6 mg/dL (ref 0.2–1.2)
BUN: 12 mg/dL (ref 6–23)
CALCIUM: 9.7 mg/dL (ref 8.4–10.5)
CO2: 30 meq/L (ref 19–32)
CREATININE: 1.06 mg/dL (ref 0.40–1.20)
Chloride: 103 mEq/L (ref 96–112)
GFR: 54.48 mL/min — ABNORMAL LOW (ref >60.00)
Glucose, Bld: 114 mg/dL — ABNORMAL HIGH (ref 70–99)
Potassium: 4.5 mEq/L (ref 3.5–5.1)
SODIUM: 140 meq/L (ref 135–145)
Total Protein: 6.6 g/dL (ref 6.0–8.3)

## 2018-09-15 LAB — TSH: TSH: 10.98 u[IU]/mL — ABNORMAL HIGH (ref 0.35–4.50)

## 2018-09-15 LAB — LDL CHOLESTEROL, DIRECT: LDL DIRECT: 132 mg/dL

## 2018-09-15 NOTE — Progress Notes (Signed)
Subjective:    Patient ID: Stacy Garcia, female    DOB: 03/15/1951, 68 y.o.   MRN: [id number]  HPI  Patient presents for yearly preventative medicine examination. She is a pleasant 68 year old female who  has a past medical history of AAA (abdominal aortic aneurysm) ([place]), Arthritis, COPD (chronic obstructive pulmonary disease) ([place]), Dyspnea, GERD (gastroesophageal reflux disease), Hyperlipidemia, Hypothyroidism, Thyroid disease, Tobacco abuse, and Vertigo.   Hypothyroidism - takes Synthroid 88 mcg  Lab Results  Component Value Date   TSH 1.48 06/19/2017    Hyperlipidemia - takes Simvastatin 40 mg and ASA 81 mg daily Lab Results  Component Value Date   CHOL 199 06/19/2017   HDL 53.50 06/19/2017   LDLCALC 121 (H) 06/19/2017   LDLDIRECT 118.4 09/01/2014   TRIG 125.0 06/19/2017   CHOLHDL 4 06/19/2017    GERD - controlled with Omeprazole   Vertigo - takes Valium when needed   COPD- followed by Pulmonary. Doing well Treleg[place]. She quit smoking in April after her last exacerbation.   AAA repair - April 2018 by Dr. Oneida Alar. She has no complaints and healed well.   All immunizations and health maintenance protocols were reviewed with the patient and needed orders were placed.  Appropriate screening laboratory values were ordered for the patient including screening of hyperlipidemia, renal function and hepatic function.  Medication reconciliation,  past medical history, social history, problem list and allergies were reviewed in detail with the patient  Goals were established with regard to weight loss, exercise, and  diet in compliance with medications Wt Readings from Last 3 Encounters:  09/15/18 168 lb (76.2 kg)  07/01/18 164 lb (74.4 kg)  06/03/18 162 lb (73.5 kg)    End of life planning was discussed.  She is up to date on routine colonoscopy and mammogram.  She has no acute complaints  Review of Systems  Constitutional: Negative.   HENT: Negative.   Eyes:  Negative.   Respiratory: Negative.   Cardiovascular: Negative.   Gastrointestinal: Negative.   Endocrine: Negative.   Genitourinary: Negative.   Musculoskeletal: Negative.   Skin: Negative.   Allergic/Immunologic: Negative.   Neurological: Negative.   Hematological: Negative.   Psychiatric/Behavioral: Negative.    Past Medical History:  Diagnosis Date  . AAA (abdominal aortic aneurysm) ([place])   . Arthritis   . COPD (chronic obstructive pulmonary disease) ([place])   . Dyspnea    just recently started wheezing  . GERD (gastroesophageal reflux disease)   . Hyperlipidemia   . Hypothyroidism   . Thyroid disease   . Tobacco abuse   . Vertigo     Social History   Socioeconomic History  . Marital status: Married    Spouse name: Not on file  . Number of children: Not on file  . Years of education: Not on file  . Highest education level: Not on file  Occupational History  . Occupation: Retired  Scientific laboratory technician  . Financial resource strain: Not on file  . Food insecurity:    Worry: Not on file    Inability: Not on file  . Transportation needs:    Medical: Not on file    Non-medical: Not on file  Tobacco Use  . Smoking status: Former Smoker    Packs/day: 1.00    Years: 50.00    Pack years: 50.00    Types: Cigarettes    Last attempt to quit: 12/31/2016    Years since quitting: 1.7  . Smokeless tobacco: Never Used  Substance and Sexual Activity  . Alcohol use: No    Alcohol/week: 0.0 standard drinks  . Drug use: No  . Sexual activity: Not on file  Lifestyle  . Physical activity:    Days per week: Not on file    Minutes per session: Not on file  . Stress: Not on file  Relationships  . Social connections:    Talks on phone: Not on file    Gets together: Not on file    Attends religious service: Not on file    Active member of club or organization: Not on file    Attends meetings of clubs or organizations: Not on file    Relationship status: Not on file  . Intimate  partner violence:    Fear of current or ex partner: Not on file    Emotionally abused: Not on file    Physically abused: Not on file    Forced sexual activity: Not on file  Other Topics Concern  . Not on file  Social History Narrative  . Not on file    Past Surgical History:  Procedure Laterality Date  . ABDOMINAL AORTIC ANEURYSM REPAIR N/A 02/05/2017   Procedure: ANEURYSM ABDOMINAL AORTIC REPAIR;  Surgeon: Elam Dutch, MD;  Location: [facility] OR;  Service: Vascular;  Laterality: N/A;  . CESAREAN SECTION     x 3  . JOINT REPLACEMENT    . SHOULDER SURGERY  02/2015  . TOTAL KNEE ARTHROPLASTY Right 01/23/2016   Procedure: TOTAL KNEE ARTHROPLASTY;  Surgeon: Garald Balding, MD;  Location: [facility];  Service: Orthopedics;  Laterality: Right;  . TUBAL LIGATION      Family History  Problem Relation Age of Onset  . Thyroid disease Other   . Diabetes Other   . Hyperlipidemia Other   . Hypertension Other     Allergies  Allergen Reactions  . No Known Allergies     Current Outpatient Medications on File Prior to Visit  Medication Sig Dispense Refill  . albuterol (VENTOLIN HFA) 108 (90 Base) MCG/ACT inhaler Inhale 2 puffs into the lungs every 6 (six) hours as needed for wheezing or shortness of breath. 1 Inhaler 5  . aspirin EC 81 MG tablet Take 81 mg by mouth at bedtime.     . diazepam (VALIUM) 5 MG tablet TAKE 1/2 TO 1 TABLET TWICE A DAY AS NEEDED 30 tablet 2  . Fluticasone-Umeclidin-Vilant (TRELEGY ELLIPTA) 100-62.5-25 MCG/INH AEPB Inhale 1 puff into the lungs daily. 28 each 5  . levothyroxine (SYNTHROID, LEVOTHROID) 88 MCG tablet TAKE 1 TABLET BY MOUTH EVERY DAY 90 tablet 0  . omeprazole (PRILOSEC) 40 MG capsule Take 40 mg twice daily for one month ,then decrease to once daily 90 capsule 11  . simvastatin (ZOCOR) 40 MG tablet TAKE 1 TABLET AT BEDTIME 90 tablet 0  . dicyclomine (BENTYL) 20 MG tablet TAKE 1 TABLET BY MOUTH TWICE A DAY (Patient not taking: No sig reported) 60 tablet 2     Current Facility-Administered Medications on File Prior to Visit  Medication Dose Route Frequency Provider Last Rate Last Dose  . 0.9 %  sodium chloride infusion  500 mL Intravenous Once Armbruster, Carlota Raspberry, MD        BP 130/80   Temp (!) 97.4 F (36.3 C)   Ht 5' 4.5" (1.638 m)   Wt 168 lb (76.2 kg)   BMI 28.39 kg/m       Objective:   Physical Exam Vitals signs and nursing note reviewed.  Constitutional:      General: She is not in acute distress.    Appearance: Normal appearance. She is well-developed and normal weight.  HENT:     Head: Normocephalic and atraumatic.     Right Ear: Tympanic membrane, ear canal and external ear normal. There is no impacted cerumen.     Left Ear: Tympanic membrane, ear canal and external ear normal. There is no impacted cerumen.     Nose: Nose normal. No congestion or rhinorrhea.     Mouth/Throat:     Pharynx: No oropharyngeal exudate.  Eyes:     General:        Right eye: No discharge.        Left eye: No discharge.     Conjunctiva/sclera: Conjunctivae normal.  Neck:     Musculoskeletal: Normal range of motion and neck supple.     Thyroid: No thyromegaly.     Trachea: No tracheal deviation.  Cardiovascular:     Rate and Rhythm: Normal rate and regular rhythm.     Heart sounds: Normal heart sounds. No murmur. No friction rub. No gallop.   Pulmonary:     Effort: Pulmonary effort is normal. No respiratory distress.     Breath sounds: Normal breath sounds. No stridor. No wheezing, rhonchi or rales.  Chest:     Chest wall: No tenderness.  Abdominal:     General: Bowel sounds are normal. There is no distension.     Palpations: Abdomen is soft. There is no mass.     Tenderness: There is no abdominal tenderness. There is no right CVA tenderness, left CVA tenderness, guarding or rebound.     Hernia: No hernia is present.  Musculoskeletal: Normal range of motion.  Lymphadenopathy:     Cervical: No cervical adenopathy.  Skin:     General: Skin is warm and dry.     Capillary Refill: Capillary refill takes less than 2 seconds.     Coloration: Skin is not pale.     Findings: No erythema or rash.     Comments: Surgical scar on abdomen   Neurological:     Mental Status: She is alert and oriented to person, place, and time.     Cranial Nerves: No cranial nerve deficit.     Sensory: Sensory deficit: Consider increase.     Coordination: Coordination normal.  Psychiatric:        Behavior: Behavior normal.        Thought Content: Thought content normal.        Judgment: Judgment normal.        Assessment & Plan:  1. Routine general medical examination at a health care facility - Follow up in one year  - Stressed importance of diet and exercise  - CBC with Differential/Platelet - Comprehensive metabolic panel - Lipid panel - TSH  2. Pulmonary emphysema, unspecified emphysema type ([place]) - Continue with pulmonary plan of care  3. HYPOTHYROIDISM, POST-RADIOACTIVE IODINE - Consider dose change  - CBC with Differential/Platelet - Comprehensive metabolic panel - Lipid panel - TSH  4. TOBACCO ABUSE - Congratulated on quitting smoking   5. Mixed hyperlipidemia - Consider dose change  - CBC with Differential/Platelet - Comprehensive metabolic panel - Lipid panel - TSH  [name], NP

## 2018-09-16 ENCOUNTER — Other Ambulatory Visit: Payer: Self-pay | Admitting: Adult Health

## 2018-09-16 ENCOUNTER — Other Ambulatory Visit: Payer: Self-pay | Admitting: Family Medicine

## 2018-09-16 DIAGNOSIS — E034 Atrophy of thyroid (acquired): Secondary | ICD-10-CM

## 2018-09-16 MED ORDER — LEVOTHYROXINE SODIUM 100 MCG PO TABS: 100.0000 ug | ORAL_TABLET | Freq: Every day | ORAL | 1 refills | Status: DC

## 2018-09-16 MED ORDER — ATORVASTATIN CALCIUM 40 MG PO TABS: 40.0000 mg | ORAL_TABLET | Freq: Every day | ORAL | 3 refills | Status: DC

## 2018-09-16 MED ORDER — DIAZEPAM 5 MG PO TABS: ORAL_TABLET | ORAL | 2 refills | Status: DC

## 2018-09-16 NOTE — Telephone Encounter (Signed)
Sent to the pharmacy by e-scribe. 

## 2018-10-09 ENCOUNTER — Other Ambulatory Visit: Payer: Self-pay | Admitting: Adult Health

## 2018-10-21 ENCOUNTER — Other Ambulatory Visit (INDEPENDENT_AMBULATORY_CARE_PROVIDER_SITE_OTHER): Payer: Medicare HMO

## 2018-10-21 DIAGNOSIS — E034 Atrophy of thyroid (acquired): Secondary | ICD-10-CM | POA: Diagnosis not present

## 2018-10-22 ENCOUNTER — Other Ambulatory Visit: Payer: Self-pay | Admitting: Family Medicine

## 2018-10-22 LAB — TSH: TSH: 0.54 u[IU]/mL (ref 0.35–4.50)

## 2018-10-22 MED ORDER — LEVOTHYROXINE SODIUM 100 MCG PO TABS: 100.0000 ug | ORAL_TABLET | Freq: Every day | ORAL | 3 refills | Status: DC

## 2018-10-22 NOTE — Telephone Encounter (Signed)
Sent to [place] by e-scribe.

## 2018-10-23 ENCOUNTER — Telehealth (INDEPENDENT_AMBULATORY_CARE_PROVIDER_SITE_OTHER): Payer: Self-pay | Admitting: Orthopaedic Surgery

## 2018-10-23 NOTE — Telephone Encounter (Signed)
Letter has been picked up 

## 2018-10-23 NOTE — Telephone Encounter (Signed)
sharoPatient called requesting a copy of her letter that she has on file that shows she had a hip replacement and can go through the metal detector to see her son in prison.  Patient is requesting that you print out a copy of the letter so she can stop by the office in the next 20 min.

## 2018-10-30 ENCOUNTER — Telehealth: Payer: Self-pay

## 2018-10-30 ENCOUNTER — Ambulatory Visit: Payer: Medicare HMO

## 2018-10-30 NOTE — Progress Notes (Deleted)
Subjective:   Stacy Garcia is a 68 y.o. female who presents for an Initial Medicare Annual Wellness Visit.  Review of Systems    No ROS.  Medicare Wellness Visit. Additional risk factors are reflected in the social history.     Sleep patterns: {SX; SLEEP PATTERNS:18802::"feels rested on waking","does not get up to void","gets up *** times nightly to void","sleeps *** hours nightly"}.    Home Safety/Smoke Alarms: Feels safe in home. Smoke alarms in place.  Living environment; residence and Firearm Safety: {Rehab home environment / accessibility:30080::"no firearms","firearms stored safely"}. Seat Belt Safety/Bike Helmet: Wears seat belt.   Female:   Pap- N/A d/t age      48-  09/2016, overdue     Dexa scan- 10/2013, overdue       CCS- 12/2017, due 12/2020     Objective:    There were no vitals filed for this visit. There is no height or weight on file to calculate BMI.  Advanced Directives 03/13/2017 02/06/2017 01/28/2017 01/12/2016 02/22/2015  Does Patient Have a Medical Advance Directive? Yes No No No No  Type of Advance Directive [place]  Would patient like information on creating a medical advance directive? - No - Patient declined No - Patient declined No - patient declined information Yes - Educational materials given    Current Medications (verified) Outpatient Encounter Medications as of 10/30/2018  Medication Sig  . albuterol (VENTOLIN HFA) 108 (90 Base) MCG/ACT inhaler Inhale 2 puffs into the lungs every 6 (six) hours as needed for wheezing or shortness of breath.  Marland Kitchen aspirin EC 81 MG tablet Take 81 mg by mouth at bedtime.   Marland Kitchen atorvastatin (LIPITOR) 40 MG tablet Take 1 tablet (40 mg total) by mouth daily.  . diazepam (VALIUM) 5 MG tablet TAKE 1/2 TO 1 TABLET TWICE A DAY AS NEEDED  . dicyclomine (BENTYL) 20 MG tablet TAKE 1 TABLET BY MOUTH TWICE A DAY (Patient not taking: No sig reported)  . Fluticasone-Umeclidin-Vilant (TRELEGY ELLIPTA)  100-62.5-25 MCG/INH AEPB Inhale 1 puff into the lungs daily.  Marland Kitchen levothyroxine (SYNTHROID, LEVOTHROID) 100 MCG tablet Take 1 tablet (100 mcg total) by mouth daily.  Marland Kitchen omeprazole (PRILOSEC) 40 MG capsule Take 40 mg twice daily for one month ,then decrease to once daily   Facility-Administered Encounter Medications as of 10/30/2018  Medication  . 0.9 %  sodium chloride infusion    Allergies (verified) No known allergies   History: Past Medical History:  Diagnosis Date  . AAA (abdominal aortic aneurysm) ([place])   . Arthritis   . COPD (chronic obstructive pulmonary disease) ([place])   . Dyspnea    just recently started wheezing  . GERD (gastroesophageal reflux disease)   . Hyperlipidemia   . Hypothyroidism   . Thyroid disease   . Tobacco abuse   . Vertigo    Past Surgical History:  Procedure Laterality Date  . ABDOMINAL AORTIC ANEURYSM REPAIR N/A 02/05/2017   Procedure: ANEURYSM ABDOMINAL AORTIC REPAIR;  Surgeon: Elam Dutch, MD;  Location: [facility] OR;  Service: Vascular;  Laterality: N/A;  . CESAREAN SECTION     x 3  . JOINT REPLACEMENT    . SHOULDER SURGERY  02/2015  . TOTAL KNEE ARTHROPLASTY Right 01/23/2016   Procedure: TOTAL KNEE ARTHROPLASTY;  Surgeon: Garald Balding, MD;  Location: [place];  Service: Orthopedics;  Laterality: Right;  . TUBAL LIGATION     Family History  Problem Relation Age of Onset  .  Thyroid disease Other   . Diabetes Other   . Hyperlipidemia Other   . Hypertension Other    Social History   Socioeconomic History  . Marital status: Married    Spouse name: Not on file  . Number of children: Not on file  . Years of education: Not on file  . Highest education level: Not on file  Occupational History  . Occupation: Retired  Scientific laboratory technician  . Financial resource strain: Not on file  . Food insecurity:    Worry: Not on file    Inability: Not on file  . Transportation needs:    Medical: Not on file    Non-medical: Not on file  Tobacco Use  . Smoking  status: Former Smoker    Packs/day: 1.00    Years: 50.00    Pack years: 50.00    Types: Cigarettes    Last attempt to quit: 12/31/2016    Years since quitting: 1.8  . Smokeless tobacco: Never Used  Substance and Sexual Activity  . Alcohol use: No    Alcohol/week: 0.0 standard drinks  . Drug use: No  . Sexual activity: Not on file  Lifestyle  . Physical activity:    Days per week: Not on file    Minutes per session: Not on file  . Stress: Not on file  Relationships  . Social connections:    Talks on phone: Not on file    Gets together: Not on file    Attends religious service: Not on file    Active member of club or organization: Not on file    Attends meetings of clubs or organizations: Not on file    Relationship status: Not on file  Other Topics Concern  . Not on file  Social History Narrative  . Not on file    Tobacco Counseling Counseling given: Not Answered  Activities of Daily Living No flowsheet data found.   Immunizations and Health Maintenance Immunization History  Administered Date(s) Administered  . Influenza Whole 07/20/2007, 07/17/2010  . Influenza, High Dose Seasonal PF 06/19/2017, 07/01/2018  . Influenza,inj,Quad PF,6+ Mos 09/01/2013, 10/26/2015  . Pneumococcal Conjugate-13 06/19/2017  . Pneumococcal Polysaccharide-23 08/01/2008, 10/26/2015  . Td 09/09/2001  . Tdap 02/24/2012   Health Maintenance Due  Topic Date Due  . DEXA SCAN  10/11/2013  . MAMMOGRAM  09/18/2018    Patient Care Team: [name], NP as PCP - General (Family Medicine)  Indicate any recent Medical Services you may have received from other than [facility] providers in the past year (date may be approximate).     Assessment:   This is a routine wellness examination for [place]. Physical assessment deferred to PCP.   Hearing/Vision screen No exam data present  Dietary issues and exercise activities discussed:   Diet (meal preparation, eat out, water intake, caffeinated  beverages, dairy products, fruits and vegetables): {Desc; diets:16563}       Goals   None    Depression Screen PHQ 2/9 Scores 09/15/2018 10/16/2015 04/28/2014  PHQ - 2 Score 0 0 0    Fall Risk Fall Risk  09/15/2018 06/19/2017 10/16/2015 04/28/2014  Falls in the past year? 0 No No No     Cognitive Function:        Screening Tests Health Maintenance  Topic Date Due  . DEXA SCAN  10/11/2013  . MAMMOGRAM  09/18/2018  . COLONOSCOPY  01/06/2021  . TETANUS/TDAP  02/23/2022  . INFLUENZA VACCINE  Completed  . Hepatitis C Screening  Completed  .  PNA vac Low Risk Adult  Completed    Qualifies for Shingles Vaccine?       Plan:     I have personally reviewed and noted the following in the patient's chart:   . Medical and social history . Use of alcohol, tobacco or illicit drugs  . Current medications and supplements . Functional ability and status . Nutritional status . Physical activity . Advanced directives . List of other physicians . Hospitalizations, surgeries, and ER visits in previous 12 months . Vitals . Screenings to include cognitive, depression, and falls . Referrals and appointments  In addition, I have reviewed and discussed with patient certain preventive protocols, quality metrics, and best practice recommendations. A written personalized care plan for preventive services as well as general preventive health recommendations were provided to patient.     Alphia Moh, RN   10/30/2018

## 2018-10-30 NOTE — Telephone Encounter (Signed)
Pt. no-showed for awv, and author phoned pt. To reschedule. Appointment rescheduled for 2/25 at 10AM.

## 2018-11-02 NOTE — Progress Notes (Signed)
Subjective:   Stacy Garcia is a 68 y.o. female who presents for an Initial Medicare Annual Wellness Visit.  Review of Systems    No ROS.  Medicare Wellness Visit. Additional risk factors are reflected in the social history.   Cardiac Risk Factors include: advanced age (>1men, >46 women);sedentary lifestyle;dyslipidemia Sleep patterns: feels rested on waking, gets up 2 times nightly to void and sleeps 6-8 hours nightly. Some interrupted sleep, naps occasionally.   Home Safety/Smoke Alarms: Feels safe in home. Smoke alarms in place.  Living environment; residence and Firearm Safety: split level / walkout. No use or need for DME at this time.  Seat Belt Safety/Bike Helmet: Wears seat belt.   Female:   Pap-  01/2018     Mammo- 09/2016, overdue. Ordered.       Dexa scan- due. Order placed.       CCS- 12/2017, due 12/2020      Objective:    Today's Vitals   11/03/18 1031  BP: 116/80  Pulse: 70  Resp: 20  Temp: 97.6 F (36.4 C)  SpO2: 95%  Weight: 171 lb (77.6 kg)  Height: 5\' 5"  (1.651 m)  PainSc: 5   PainLoc: Back   Body mass index is 28.46 kg/m.  Advanced Directives 11/03/2018 03/13/2017 02/06/2017 01/28/2017 01/12/2016 02/22/2015  Does Patient Have a Medical Advance Directive? Yes Yes No No No No  Type of Paramedic of [place];Living will [place] in Chart? No - copy requested - - - - -  Would patient like information on creating a medical advance directive? - - No - Patient declined No - Patient declined No - patient declined information Yes - Educational materials given    Current Medications (verified) Outpatient Encounter Medications as of 11/03/2018  Medication Sig  . albuterol (VENTOLIN HFA) 108 (90 Base) MCG/ACT inhaler Inhale 2 puffs into the lungs every 6 (six) hours as needed for wheezing or shortness of breath.  Marland Kitchen aspirin 325 MG EC tablet Take 325 mg by mouth daily.  Marland Kitchen  atorvastatin (LIPITOR) 40 MG tablet Take 1 tablet (40 mg total) by mouth daily.  . diazepam (VALIUM) 5 MG tablet TAKE 1/2 TO 1 TABLET TWICE A DAY AS NEEDED  . dicyclomine (BENTYL) 20 MG tablet TAKE 1 TABLET BY MOUTH TWICE A DAY (Patient taking differently: daily as needed. )  . levothyroxine (SYNTHROID, LEVOTHROID) 100 MCG tablet Take 1 tablet (100 mcg total) by mouth daily.  Marland Kitchen omeprazole (PRILOSEC) 40 MG capsule Take 40 mg twice daily for one month ,then decrease to once daily (Patient taking differently: daily as needed. Take 40 mg twice daily for one month ,then decrease to once daily)  . Fluticasone-Umeclidin-Vilant (TRELEGY ELLIPTA) 100-62.5-25 MCG/INH AEPB Inhale 1 puff into the lungs daily. (Patient not taking: Reported on 11/03/2018)  . [DISCONTINUED] aspirin EC 81 MG tablet Take 81 mg by mouth at bedtime.    Facility-Administered Encounter Medications as of 11/03/2018  Medication  . 0.9 %  sodium chloride infusion    Allergies (verified) No known allergies   History: Past Medical History:  Diagnosis Date  . AAA (abdominal aortic aneurysm) ([place])   . Arthritis   . COPD (chronic obstructive pulmonary disease) ([place])   . Dyspnea    just recently started wheezing  . GERD (gastroesophageal reflux disease)   . Hyperlipidemia   . Hypothyroidism   . Thyroid disease   . Tobacco  abuse   . Vertigo    Past Surgical History:  Procedure Laterality Date  . ABDOMINAL AORTIC ANEURYSM REPAIR N/A 02/05/2017   Procedure: ANEURYSM ABDOMINAL AORTIC REPAIR;  Surgeon: Elam Dutch, MD;  [facility];  Service: Vascular;  Laterality: N/A;  . CESAREAN SECTION     x 3  . JOINT REPLACEMENT    . SHOULDER SURGERY  02/2015  . TOTAL KNEE ARTHROPLASTY Right 01/23/2016   Procedure: TOTAL KNEE ARTHROPLASTY;  Surgeon: Garald Balding, MD;  Location: [facility];  Service: Orthopedics;  Laterality: Right;  . TUBAL LIGATION     Family History  Problem Relation Age of Onset  . Thyroid disease Other   .  Diabetes Other   . Hyperlipidemia Other   . Hypertension Other    Social History   Socioeconomic History  . Marital status: Married    Spouse name: Not on file  . Number of children: 3  . Years of education: Not on file  . Highest education level: Not on file  Occupational History  . Occupation: Designer/manufacturing    Comment: Librarian, academic; retired 2017  Social Needs  . Financial resource strain: Somewhat hard  . Food insecurity:    Worry: Never true    Inability: Never true  . Transportation needs:    Medical: No    Non-medical: No  Tobacco Use  . Smoking status: Former Smoker    Packs/day: 1.00    Years: 50.00    Pack years: 50.00    Types: Cigarettes    Last attempt to quit: 12/31/2016    Years since quitting: 1.8  . Smokeless tobacco: Never Used  Substance and Sexual Activity  . Alcohol use: No    Alcohol/week: 0.0 standard drinks  . Drug use: No  . Sexual activity: Not Currently  Lifestyle  . Physical activity:    Days per week: 0 days    Minutes per session: 0 min  . Stress: Only a little  Relationships  . Social connections:    Talks on phone: More than three times a week    Gets together: More than three times a week    Attends religious service: Not on file    Active member of club or organization: No    Attends meetings of clubs or organizations: Never    Relationship status: Married  Other Topics Concern  . Not on file  Social History Narrative   11/03/2018:   Lives with husband in 2 story home   Has 3 children, 7 grandchildren, 67 great-grandchildren, all of whom live close by   Takes care of two of her great grandchildren (44 yo and 78yo)   Horticulturist, commercial working, 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    Tobacco Counseling Counseling given: Not Answered   Activities of Daily Living In your present state of health, do you have any difficulty performing the following activities: 11/03/2018  Hearing? Y    Comment L ear clicking sound. Audiology resources provided  Vision? N  Difficulty concentrating or making decisions? N  Walking or climbing stairs? N  Dressing or bathing? N  Doing errands, shopping? N  Preparing Food and eating ? N  Using the Toilet? N  In the past six months, have you accidently leaked urine? N  Do you have problems with loss of bowel control? N  Managing your Medications? N  Comment some difficulty affording ellipta; author advised to check out goodrx.com and other rx savings websites  Managing your Finances? N  Housekeeping or managing your Housekeeping? N  Some recent data might be hidden     Immunizations and Health Maintenance Immunization History  Administered Date(s) Administered  . Influenza Whole 07/20/2007, 07/17/2010  . Influenza, High Dose Seasonal PF 06/19/2017, 07/01/2018  . Influenza,inj,Quad PF,6+ Mos 09/01/2013, 10/26/2015  . Pneumococcal Conjugate-13 06/19/2017  . Pneumococcal Polysaccharide-23 08/01/2008, 10/26/2015  . Td 09/09/2001  . Tdap 02/24/2012   Health Maintenance Due  Topic Date Due  . DEXA SCAN  10/11/2013  . MAMMOGRAM  09/18/2018    Patient Care Team: [name], NP as PCP - General (Family Medicine) Garald Balding, MD as Consulting Physician (Orthopedic Surgery) Chesley Mires, MD as Consulting Physician (Pulmonary Disease)  Indicate any recent Medical Services you may have received from other than [facility] providers in the past year (date may be approximate).     Assessment:   This is a routine wellness examination for [place]. Physical assessment deferred to PCP.   Hearing/Vision screen  Hearing Screening   125Hz  250Hz  500Hz  1000Hz  2000Hz  3000Hz  4000Hz  6000Hz  8000Hz   Right ear:   Pass Pass Pass  Fail    Left ear:   Fail Fail Fail  Fail    Vision Screening Comments: Pt. Denies vision issues, has not seen eye doctor in several years, needs new reading glasses. Advised to follow up with ophthamology, list of  providers given.  Dietary issues and exercise activities discussed: Current Exercise Habits: The patient does not participate in regular exercise at present, Exercise limited by: respiratory conditions(s);cardiac condition(s);orthopedic condition(s)  Diet (meal preparation, eat out, water intake, caffeinated beverages, dairy products, fruits and vegetables): in general, an "unhealthy" diet, high fat/ cholesterol. Does not enjoy vegetables or fruit, likes red meat. Pt. Encouraged to season vegetables to make food taste better, but pt. Not open to making any dietary changes. Pt. states she does not drink enough water, but also didn't seem interested in making changes regarding this. Risk for stroke/MI reviewed with pt. concerning high cholesterol and triglycerides, and education provided to take home and review.       Goals    . Patient Stated     Get back on stationary bike!       Depression Screen PHQ 2/9 Scores 11/03/2018 09/15/2018 10/16/2015 04/28/2014  PHQ - 2 Score 0 0 0 0  PHQ- 9 Score 0 - - -   Pt has had many deaths in the family over the past few years, and has been grieving off/on, but does not feel like she needs counseling or any other resources at this time. Existing family is supportive.   Fall Risk Fall Risk  11/03/2018 09/15/2018 06/19/2017 10/16/2015 04/28/2014  Falls in the past year? 0 0 No No No     Cognitive Function:       Ad8 score reviewed for issues:  Issues making decisions: no  Less interest in hobbies / activities: no  Repeats questions, stories (family complaining): no  Trouble using ordinary gadgets (microwave, computer, phone):no  Forgets the month or year: no  Mismanaging finances: no  Remembering appts: no  Daily problems with thinking and/or memory: no Ad8 score is= 0    Screening Tests Health Maintenance  Topic Date Due  . DEXA SCAN  10/11/2013  . MAMMOGRAM  09/18/2018  . COLONOSCOPY  01/06/2021  . TETANUS/TDAP  02/23/2022  . INFLUENZA  VACCINE  Completed  . Hepatitis C Screening  Completed  . PNA vac Low Risk Adult  Completed  Qualifies for Shingles Vaccine? Yes, but pt. declined. Education provided.    Plan:    Bone density and mammogram orders placed. [facility] should contact you in next week or so to schedule.  Limit fats in your diet to lower risk of heart attack/stroke  Ask orthopedist about pain med relief for back. If you would like, can ask Tommi Rumps about doing pain clinic referral.  Refer to ophthamology resources provided. Schedule eye exam asap.  Refer to audiology resources and providers. Let us know if we can assist you further.   Bring a copy of your living will and/or healthcare power of attorney to your next office visit.  Consider getting shingles vaccine (shingrix) at local pharmacy.  Kegel exercise information provided for bladder muscle control. I have personally reviewed and noted the following in the patient's chart:   . Medical and social history . Use of alcohol, tobacco or illicit drugs  . Current medications and supplements . Functional ability and status . Nutritional status . Physical activity . Advanced directives . List of other physicians . Hospitalizations, surgeries, and ER visits in previous 12 months . Vitals . Screenings to include cognitive, depression, and falls . Referrals and appointments  In addition, I have reviewed and discussed with patient certain preventive protocols, quality metrics, and best practice recommendations. A written personalized care plan for preventive services as well as general preventive health recommendations were provided to patient.     Alphia Moh, RN   11/03/2018

## 2018-11-03 ENCOUNTER — Ambulatory Visit (INDEPENDENT_AMBULATORY_CARE_PROVIDER_SITE_OTHER): Payer: Medicare HMO

## 2018-11-03 ENCOUNTER — Telehealth: Payer: Self-pay

## 2018-11-03 VITALS — BP 116/80 | HR 70 | Temp 97.6°F | Resp 20 | Ht 65.0 in | Wt 171.0 lb

## 2018-11-03 DIAGNOSIS — Z13820 Encounter for screening for osteoporosis: Secondary | ICD-10-CM | POA: Diagnosis not present

## 2018-11-03 DIAGNOSIS — Z0000 Encounter for general adult medical examination without abnormal findings: Secondary | ICD-10-CM

## 2018-11-03 DIAGNOSIS — Z1239 Encounter for other screening for malignant neoplasm of breast: Secondary | ICD-10-CM

## 2018-11-03 NOTE — Telephone Encounter (Signed)
During awv, pt. Stated her lumbar back pain has been a barrier to quality of life, and knows her PCP will not prescribe narcotics, but was wondering if she should be seen at a pain clinic. In the past, per pt., pt. has taken hydrocodone with good effect, but alternatives like oxycodone/oxycontin and tramadol gave her bad side effect. Pt. Requesting advice from PCP.

## 2018-11-03 NOTE — Telephone Encounter (Signed)
I would be happy to discuss this with her during an office visit. Likely will need PT first

## 2018-11-03 NOTE — Patient Instructions (Addendum)
Bone density and mammogram orders placed. [facility] should contact you in next week or so to schedule.  Limit fats in your diet to lower risk of heart attack/stroke  Ask orthopedist about pain med relief for back. If you would like, can ask Tommi Rumps about doing pain clinic referral.  Refer to ophthamology resources provided. Schedule eye exam asap.  Refer to audiology resources and providers. Let us know if we can assist you further.   Bring a copy of your living will and/or healthcare power of attorney to your next office visit.  Consider getting shingles vaccine (shingrix) at local pharmacy.  Kegel exercise information provided for bladder muscle control.   Stacy Garcia , Thank you for taking time to come for your Medicare Wellness Visit. I appreciate your ongoing commitment to your health goals. Please review the following plan we discussed and let me know if I can assist you in the future.   These are the goals we discussed: Goals    . Patient Stated     Get back on stationary bike!        This is a list of the screening recommended for you and due dates:  Health Maintenance  Topic Date Due  . DEXA scan (bone density measurement)  10/11/2013  . Mammogram  09/18/2018  . Colon Cancer Screening  01/06/2021  . Tetanus Vaccine  02/23/2022  . Flu Shot  Completed  .  Hepatitis C: One time screening is recommended by Center for Disease Control  (CDC) for  adults born from [birth year] through 1967.   Completed  . Pneumonia vaccines  Completed        Recombinant Zoster (Shingles) Vaccine, RZV: What You Need to Know 1. Why get vaccinated? Shingles (also called herpes zoster, or just zoster) is a painful skin rash, often with blisters. Shingles is caused by the varicella zoster virus, the same virus that causes chickenpox. After you have chickenpox, the virus stays in your body and can cause shingles later in life. You can't catch shingles from another person. However, a person who  has never had chickenpox (or chickenpox vaccine) could get chickenpox from someone with shingles. A shingles rash usually appears on one side of the face or body and heals within 2 to 4 weeks. Its main symptom is pain, which can be severe. Other symptoms can include fever, headache, chills, and upset stomach. Very rarely, a shingles infection can lead to pneumonia, hearing problems, blindness, brain inflammation (encephalitis), or death. For about 1 person in 5, severe pain can continue even long after the rash has cleared up. This long-lasting pain is called post-herpetic neuralgia (PHN). Shingles is far more common in people 24 years of age and older than in younger people, and the risk increases with age. It is also more common in people whose immune system is weakened because of a disease such as cancer, or by drugs such as steroids or chemotherapy. At least 1 million people a year in the [place] get shingles. 2. Shingles vaccine (recombinant) Recombinant shingles vaccine was approved by FDA in 2017 for the prevention of shingles. In clinical trials, it was more than 90% effective in preventing shingles. It can also reduce the likelihood of PHN. Two doses, 2 to 6 months apart, are recommended for adults 70 and older. This vaccine is also recommended for people who have already gotten the live shingles vaccine (Zostavax). There is no live virus in this vaccine. 3. Some people should not get this vaccine  Tell your vaccine provider if you:  Have any severe, life-threatening allergies. A person who has ever had a life-threatening allergic reaction after a dose of recombinant shingles vaccine, or has a severe allergy to any component of this vaccine, may be advised not to be vaccinated. Ask your health care provider if you want information about vaccine components.  Are pregnant or breastfeeding. There is not much information about use of recombinant shingles vaccine in pregnant or nursing women.  Your healthcare provider might recommend delaying vaccination.  Are not feeling well. If you have a mild illness, such as a cold, you can probably get the vaccine today. If you are moderately or severely ill, you should probably wait until you recover. Your doctor can advise you. 4. Risks of a vaccine reaction With any medicine, including vaccines, there is a chance of reactions. After recombinant shingles vaccination, a person might experience:  Pain, redness, soreness, or swelling at the site of the injection  Headache, muscle aches, fever, shivering, fatigue In clinical trials, most people got a sore arm with mild or moderate pain after vaccination, and some also had redness and swelling where they got the shot. Some people felt tired, had muscle pain, a headache, shivering, fever, stomach pain, or nausea. About 1 out of 6 people who got recombinant zoster vaccine experienced side effects that prevented them from doing regular activities. Symptoms went away on their own in about 2 to 3 days. Side effects were more common in younger people. You should still get the second dose of recombinant zoster vaccine even if you had one of these reactions after the first dose. Other things that could happen after this vaccine:  People sometimes faint after medical procedures, including vaccination. Sitting or lying down for about 15 minutes can help prevent fainting and injuries caused by a fall. Tell your provider if you feel dizzy or have vision changes or ringing in the ears.  Some people get shoulder pain that can be more severe and longer-lasting than routine soreness that can follow injections. This happens very rarely.  Any medication can cause a severe allergic reaction. Such reactions to a vaccine are estimated at about 1 in a million doses, and would happen within a few minutes to a few hours after the vaccination. As with any medicine, there is a very remote chance of a vaccine causing a serious  injury or death. The safety of vaccines is always being monitored. For more information, visit: http://www.aguilar.org/ 5. What if there is a serious problem? What should I look for?  Look for anything that concerns you, such as signs of a severe allergic reaction, very high fever, or unusual behavior. Signs of a severe allergic reaction can include hives, swelling of the face and throat, difficulty breathing, a fast heartbeat, dizziness, and weakness. These would usually start a few minutes to a few hours after the vaccination. What should I do?  If you think it is a severe allergic reaction or other emergency that can't wait, call 9-1-1 or get to the nearest hospital. Otherwise, call your health care provider. Afterward, the reaction should be reported to the Vaccine Adverse Event Reporting System (VAERS). Your doctor should file this report, or you can do it yourself through the VAERS website at www.vaers.SamedayNews.es, or by calling 845-424-7308. VAERS does not give medical advice. 6. How can I learn more?  Ask your health care provider. He or she can give you the vaccine package insert or suggest other sources of information.  Call your local or state health department.  Contact the Centers for Disease Control and Prevention (CDC): ? Call 775-435-9937 (1-800-CDC-INFO) or ? Visit CDC's vaccines website at http://hunter.com/ CDC Vaccine Information Statement Recombinant Zoster Vaccine (10/21/2016) This information is not intended to replace advice given to you by your health care provider. Make sure you discuss any questions you have with your health care provider. Document Released: 11/05/2016 Document Revised: 04/01/2018 Document Reviewed: 04/01/2018 Elsevier Interactive Patient Education  2019 [employer].   Kegel Exercises Kegel exercises help strengthen the muscles that support the rectum, vagina, small intestine, bladder, and uterus. Doing Kegel exercises can  help:  Improve bladder and bowel control.  Improve sexual response.  Reduce problems and discomfort during pregnancy. Kegel exercises involve squeezing your pelvic floor muscles, which are the same muscles you squeeze when you try to stop the flow of urine. The exercises can be done while sitting, standing, or lying down, but it is best to vary your position. Exercises 1. Squeeze your pelvic floor muscles tight. You should feel a tight lift in your rectal area. If you are a female, you should also feel a tightness in your vaginal area. Keep your stomach, buttocks, and legs relaxed. 2. Hold the muscles tight for up to 10 seconds. 3. Relax your muscles. Repeat this exercise 50 times a day or as many times as told by your health care provider. Continue to do this exercise for at least 4-6 weeks or for as long as told by your health care provider. This information is not intended to replace advice given to you by your health care provider. Make sure you discuss any questions you have with your health care provider. Document Released: 08/12/2012 Document Revised: 01/06/2017 Document Reviewed: 07/16/2015 Elsevier Interactive Patient Education  2019 [place] Maintenance, Female Adopting a healthy lifestyle and getting preventive care can go a long way to promote health and wellness. Talk with your health care provider about what schedule of regular examinations is right for you. This is a good chance for you to check in with your provider about disease prevention and staying healthy. In between checkups, there are plenty of things you can do on your own. Experts have done a lot of research about which lifestyle changes and preventive measures are most likely to keep you healthy. Ask your health care provider for more information. Weight and diet Eat a healthy diet  Be sure to include plenty of vegetables, fruits, low-fat dairy products, and lean protein.  Do not eat a lot of foods  high in solid fats, added sugars, or salt.  Get regular exercise. This is one of the most important things you can do for your health. ? Most adults should exercise for at least 150 minutes each week. The exercise should increase your heart rate and make you sweat (moderate-intensity exercise). ? Most adults should also do strengthening exercises at least twice a week. This is in addition to the moderate-intensity exercise. Maintain a healthy weight  Body mass index (BMI) is a measurement that can be used to identify possible weight problems. It estimates body fat based on height and weight. Your health care provider can help determine your BMI and help you achieve or maintain a healthy weight.  For females 54 years of age and older: ? A BMI below 18.5 is considered underweight. ? A BMI of 18.5 to 24.9 is normal. ? A BMI of 25 to 29.9 is considered overweight. ? A BMI of 30  and above is considered obese. Watch levels of cholesterol and blood lipids  You should start having your blood tested for lipids and cholesterol at 70 years of age, then have this test every 5 years.  You may need to have your cholesterol levels checked more often if: ? Your lipid or cholesterol levels are high. ? You are older than 70 years of age. ? You are at high risk for heart disease. Cancer screening Lung Cancer  Lung cancer screening is recommended for adults 55-31 years old who are at high risk for lung cancer because of a history of smoking.  A yearly low-dose CT scan of the lungs is recommended for people who: ? Currently smoke. ? Have quit within the past 15 years. ? Have at least a 30-pack-year history of smoking. A pack year is smoking an average of one pack of cigarettes a day for 1 year.  Yearly screening should continue until it has been 15 years since you quit.  Yearly screening should stop if you develop a health problem that would prevent you from having lung cancer treatment. Breast  Cancer  Practice breast self-awareness. This means understanding how your breasts normally appear and feel.  It also means doing regular breast self-exams. Let your health care provider know about any changes, no matter how small.  If you are in your 20s or 30s, you should have a clinical breast exam (CBE) by a health care provider every 1-3 years as part of a regular health exam.  If you are 62 or older, have a CBE every year. Also consider having a breast X-ray (mammogram) every year.  If you have a family history of breast cancer, talk to your health care provider about genetic screening.  If you are at high risk for breast cancer, talk to your health care provider about having an MRI and a mammogram every year.  Breast cancer gene (BRCA) assessment is recommended for women who have family members with BRCA-related cancers. BRCA-related cancers include: ? Breast. ? Ovarian. ? Tubal. ? Peritoneal cancers.  Results of the assessment will determine the need for genetic counseling and BRCA1 and BRCA2 testing. Cervical Cancer Your health care provider may recommend that you be screened regularly for cancer of the pelvic organs (ovaries, uterus, and vagina). This screening involves a pelvic examination, including checking for microscopic changes to the surface of your cervix (Pap test). You may be encouraged to have this screening done every 3 years, beginning at age 53.  For women ages 42-65, health care providers may recommend pelvic exams and Pap testing every 3 years, or they may recommend the Pap and pelvic exam, combined with testing for human papilloma virus (HPV), every 5 years. Some types of HPV increase your risk of cervical cancer. Testing for HPV may also be done on women of any age with unclear Pap test results.  Other health care providers may not recommend any screening for nonpregnant women who are considered low risk for pelvic cancer and who do not have symptoms. Ask your  health care provider if a screening pelvic exam is right for you.  If you have had past treatment for cervical cancer or a condition that could lead to cancer, you need Pap tests and screening for cancer for at least 20 years after your treatment. If Pap tests have been discontinued, your risk factors (such as having a new sexual partner) need to be reassessed to determine if screening should resume. Some women have medical problems  that increase the chance of getting cervical cancer. In these cases, your health care provider may recommend more frequent screening and Pap tests. Colorectal Cancer  This type of cancer can be detected and often prevented.  Routine colorectal cancer screening usually begins at 70 years of age and continues through 70 years of age.  Your health care provider may recommend screening at an earlier age if you have risk factors for colon cancer.  Your health care provider may also recommend using home test kits to check for hidden blood in the stool.  A small camera at the end of a tube can be used to examine your colon directly (sigmoidoscopy or colonoscopy). This is done to check for the earliest forms of colorectal cancer.  Routine screening usually begins at age 69.  Direct examination of the colon should be repeated every 5-10 years through 70 years of age. However, you may need to be screened more often if early forms of precancerous polyps or small growths are found. Skin Cancer  Check your skin from head to toe regularly.  Tell your health care provider about any new moles or changes in moles, especially if there is a change in a mole's shape or color.  Also tell your health care provider if you have a mole that is larger than the size of a pencil eraser.  Always use sunscreen. Apply sunscreen liberally and repeatedly throughout the day.  Protect yourself by wearing long sleeves, pants, a wide-brimmed hat, and sunglasses whenever you are outside. Heart  disease, diabetes, and high blood pressure  High blood pressure causes heart disease and increases the risk of stroke. High blood pressure is more likely to develop in: ? People who have blood pressure in the high end of the normal range (130-139/85-89 mm Hg). ? People who are overweight or obese. ? People who are African American.  If you are 52-18 years of age, have your blood pressure checked every 3-5 years. If you are 33 years of age or older, have your blood pressure checked every year. You should have your blood pressure measured twice-once when you are at a hospital or clinic, and once when you are not at a hospital or clinic. Record the average of the two measurements. To check your blood pressure when you are not at a hospital or clinic, you can use: ? An automated blood pressure machine at a pharmacy. ? A home blood pressure monitor.  If you are between 83 years and 47 years old, ask your health care provider if you should take aspirin to prevent strokes.  Have regular diabetes screenings. This involves taking a blood sample to check your fasting blood sugar level. ? If you are at a normal weight and have a low risk for diabetes, have this test once every three years after 70 years of age. ? If you are overweight and have a high risk for diabetes, consider being tested at a younger age or more often. Preventing infection Hepatitis B  If you have a higher risk for hepatitis B, you should be screened for this virus. You are considered at high risk for hepatitis B if: ? You were born in a country where hepatitis B is common. Ask your health care provider which countries are considered high risk. ? Your parents were born in a high-risk country, and you have not been immunized against hepatitis B (hepatitis B vaccine). ? You have HIV or AIDS. ? You use needles to inject street drugs. ?  You live with someone who has hepatitis B. ? You have had sex with someone who has hepatitis  B. ? You get hemodialysis treatment. ? You take certain medicines for conditions, including cancer, organ transplantation, and autoimmune conditions. Hepatitis C  Blood testing is recommended for: ? Everyone born from [birth year] through 1967. ? Anyone with known risk factors for hepatitis C. Sexually transmitted infections (STIs)  You should be screened for sexually transmitted infections (STIs) including gonorrhea and chlamydia if: ? You are sexually active and are younger than 70 years of age. ? You are older than 70 years of age and your health care provider tells you that you are at risk for this type of infection. ? Your sexual activity has changed since you were last screened and you are at an increased risk for chlamydia or gonorrhea. Ask your health care provider if you are at risk.  If you do not have HIV, but are at risk, it may be recommended that you take a prescription medicine daily to prevent HIV infection. This is called pre-exposure prophylaxis (PrEP). You are considered at risk if: ? You are sexually active and do not regularly use condoms or know the HIV status of your partner(s). ? You take drugs by injection. ? You are sexually active with a partner who has HIV. Talk with your health care provider about whether you are at high risk of being infected with HIV. If you choose to begin PrEP, you should first be tested for HIV. You should then be tested every 3 months for as long as you are taking PrEP. Pregnancy  If you are premenopausal and you may become pregnant, ask your health care provider about preconception counseling.  If you may become pregnant, take 400 to 800 micrograms (mcg) of folic acid every day.  If you want to prevent pregnancy, talk to your health care provider about birth control (contraception). Osteoporosis and menopause  Osteoporosis is a disease in which the bones lose minerals and strength with aging. This can result in serious bone fractures. Your  risk for osteoporosis can be identified using a bone density scan.  If you are 52 years of age or older, or if you are at risk for osteoporosis and fractures, ask your health care provider if you should be screened.  Ask your health care provider whether you should take a calcium or vitamin D supplement to lower your risk for osteoporosis.  Menopause may have certain physical symptoms and risks.  Hormone replacement therapy may reduce some of these symptoms and risks. Talk to your health care provider about whether hormone replacement therapy is right for you. Follow these instructions at home:  Schedule regular health, dental, and eye exams.  Stay current with your immunizations.  Do not use any tobacco products including cigarettes, chewing tobacco, or electronic cigarettes.  If you are pregnant, do not drink alcohol.  If you are breastfeeding, limit how much and how often you drink alcohol.  Limit alcohol intake to no more than 1 drink per day for nonpregnant women. One drink equals 12 ounces of beer, 5 ounces of wine, or 1 ounces of hard liquor.  Do not use street drugs.  Do not share needles.  Ask your health care provider for help if you need support or information about quitting drugs.  Tell your health care provider if you often feel depressed.  Tell your health care provider if you have ever been abused or do not feel safe at home. This  information is not intended to replace advice given to you by your health care provider. Make sure you discuss any questions you have with your health care provider. Document Released: 03/11/2011 Document Revised: 02/01/2016 Document Reviewed: 05/30/2015 Elsevier Interactive Patient Education  2019 [employer].   Hearing Loss  Hearing loss is a partial or total loss of the ability to hear. This can be temporary or permanent, and it can happen in one or both ears. Hearing loss may be referred to as deafness. Medical care is  necessary to treat hearing loss properly and to prevent the condition from getting worse. Your hearing may partially or completely come back, depending on what caused your hearing loss and how severe it is. In some cases, hearing loss is permanent. What are the causes? Common causes of hearing loss include:  Too much wax in the ear canal.  Infection of the ear canal or middle ear.  Fluid in the middle ear.  Injury to the ear or surrounding area.  An object stuck in the ear.  Prolonged exposure to loud sounds, such as music. Less common causes of hearing loss include:  Tumors in the ear.  Viral or bacterial infections, such as meningitis.  A hole in the eardrum (perforated eardrum).  Problems with the hearing nerve that sends signals between the brain and the ear.  Certain medicines. What are the signs or symptoms? Symptoms of this condition may include:  Difficulty telling the difference between sounds.  Difficulty following a conversation when there is background noise.  Lack of response to sounds in your environment. This may be most noticeable when you do not respond to startling sounds.  Needing to turn up the volume on the television, radio, etc.  Ringing in the ears.  Dizziness.  Pain in the ears. How is this diagnosed? This condition is diagnosed based on a physical exam and a hearing test (audiometry). The audiometry test will be performed by a hearing specialist (audiologist). You may also be referred to an ear, nose, and throat (ENT) specialist (otolaryngologist). How is this treated? Treatment for recent onset of hearing loss may include:  Ear wax removal.  Being prescribed medicines to prevent infection (antibiotics).  Being prescribed medicines to reduce inflammation (corticosteroids). Follow these instructions at home:  If you were prescribed an antibiotic medicine, take it as told by your health care provider. Do not stop taking the antibiotic even  if you start to feel better.  Take over-the-counter and prescription medicines only as told by your health care provider.  Avoid loud noises.  Return to your normal activities as told by your health care provider. Ask your health care provider what activities are safe for you.  Keep all follow-up visits as told by your health care provider. This is important. Contact a health care provider if:  You feel dizzy.  You develop new symptoms.  You vomit or feel nauseous.  You have a fever. Get help right away if:  You develop sudden changes in your vision.  You have severe ear pain.  You have new or increased weakness.  You have a severe headache. This information is not intended to replace advice given to you by your health care provider. Make sure you discuss any questions you have with your health care provider. Document Released: 08/26/2005 Document Revised: 02/01/2016 Document Reviewed: 01/11/2015 Elsevier Interactive Patient Education  2019 [employer].

## 2018-11-04 NOTE — Telephone Encounter (Signed)
Author phoned pt to relay PCP message and offer to schedule appointment. Appointment made for 2/28 at [facility]

## 2018-11-06 ENCOUNTER — Encounter: Payer: Self-pay | Admitting: Adult Health

## 2018-11-06 ENCOUNTER — Ambulatory Visit (INDEPENDENT_AMBULATORY_CARE_PROVIDER_SITE_OTHER): Payer: Medicare HMO | Admitting: Adult Health

## 2018-11-06 VITALS — BP 120/84 | Temp 97.6°F | Wt 172.0 lb

## 2018-11-06 DIAGNOSIS — M5116 Intervertebral disc disorders with radiculopathy, lumbar region: Secondary | ICD-10-CM | POA: Diagnosis not present

## 2018-11-06 NOTE — Progress Notes (Signed)
Subjective:    Patient ID: Stacy Garcia, female    DOB: Apr 04, 1951, 68 y.o.   MRN: [id number]  HPI  68 year old female who  has a past medical history of AAA (abdominal aortic aneurysm) ([place]), Arthritis, COPD (chronic obstructive pulmonary disease) ([place]), Dyspnea, GERD (gastroesophageal reflux disease), Hyperlipidemia, Hypothyroidism, Thyroid disease, Tobacco abuse, and Vertigo.   She presents to the office today for a chronic issue of low back pain with right-sided sciatica.  This is been an issue for multiple years.  He was last seen by Dr. Durward Fortes in July 2019, at this time her MRI demonstrated a potentially symptomatic right side neural impingement at L3/L4 and L4-5 this was related to protruding disks.  She also has central and leftward protrusions at L1-L3.  The patient refused epidural injection at this time as she is "deathly afraid of needles".  Dr. Durward Fortes tried a Medrol dose pack and asked the patient to be reevaluated in 2 weeks.  Unfortunately she never went back for reevaluation.  He also recommended physical therapy for reevaluation.  She is interested in being referred to pain management   Review of Systems See HPI   Past Medical History:  Diagnosis Date  . AAA (abdominal aortic aneurysm) ([place])   . Arthritis   . COPD (chronic obstructive pulmonary disease) ([place])   . Dyspnea    just recently started wheezing  . GERD (gastroesophageal reflux disease)   . Hyperlipidemia   . Hypothyroidism   . Thyroid disease   . Tobacco abuse   . Vertigo     Social History   Socioeconomic History  . Marital status: Married    Spouse name: Not on file  . Number of children: 3  . Years of education: Not on file  . Highest education level: Not on file  Occupational History  . Occupation: Designer/manufacturing    Comment: Librarian, academic; retired 2017  Social Needs  . Financial resource strain: Somewhat hard  . Food insecurity:    Worry: Never true    Inability: Never  true  . Transportation needs:    Medical: No    Non-medical: No  Tobacco Use  . Smoking status: Former Smoker    Packs/day: 1.00    Years: 50.00    Pack years: 50.00    Types: Cigarettes    Last attempt to quit: 12/31/2016    Years since quitting: 1.8  . Smokeless tobacco: Never Used  Substance and Sexual Activity  . Alcohol use: No    Alcohol/week: 0.0 standard drinks  . Drug use: No  . Sexual activity: Not Currently  Lifestyle  . Physical activity:    Days per week: 0 days    Minutes per session: 0 min  . Stress: Only a little  Relationships  . Social connections:    Talks on phone: More than three times a week    Gets together: More than three times a week    Attends religious service: Not on file    Active member of club or organization: No    Attends meetings of clubs or organizations: Never    Relationship status: Married  . Intimate partner violence:    Fear of current or ex partner: No    Emotionally abused: No    Physically abused: No    Forced sexual activity: No  Other Topics Concern  . Not on file  Social History Narrative   11/03/2018:   Lives with husband in 2 story home  Has 3 children, 7 grandchildren, 73 great-grandchildren, all of whom live close by   Takes care of two of her great grandchildren (13 yo and 2yo)   [place] working, 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    Past Surgical History:  Procedure Laterality Date  . ABDOMINAL AORTIC ANEURYSM REPAIR N/A 02/05/2017   Procedure: ANEURYSM ABDOMINAL AORTIC REPAIR;  Surgeon: Elam Dutch, MD;  Location: [facility] OR;  Service: Vascular;  Laterality: N/A;  . CESAREAN SECTION     x 3  . JOINT REPLACEMENT    . SHOULDER SURGERY  02/2015  . TOTAL KNEE ARTHROPLASTY Right 01/23/2016   Procedure: TOTAL KNEE ARTHROPLASTY;  Surgeon: Garald Balding, MD;  Location: [facility];  Service: Orthopedics;  Laterality: Right;  . TUBAL LIGATION      Family History  Problem  Relation Age of Onset  . Thyroid disease Other   . Diabetes Other   . Hyperlipidemia Other   . Hypertension Other     Allergies  Allergen Reactions  . No Known Allergies     Current Outpatient Medications on File Prior to Visit  Medication Sig Dispense Refill  . albuterol (VENTOLIN HFA) 108 (90 Base) MCG/ACT inhaler Inhale 2 puffs into the lungs every 6 (six) hours as needed for wheezing or shortness of breath. 1 Inhaler 5  . aspirin 325 MG EC tablet Take 325 mg by mouth daily.    Marland Kitchen atorvastatin (LIPITOR) 40 MG tablet Take 1 tablet (40 mg total) by mouth daily. 90 tablet 3  . diazepam (VALIUM) 5 MG tablet TAKE 1/2 TO 1 TABLET TWICE A DAY AS NEEDED 30 tablet 2  . dicyclomine (BENTYL) 20 MG tablet TAKE 1 TABLET BY MOUTH TWICE A DAY (Patient taking differently: daily as needed. ) 60 tablet 2  . Fluticasone-Umeclidin-Vilant (TRELEGY ELLIPTA) 100-62.5-25 MCG/INH AEPB Inhale 1 puff into the lungs daily. 28 each 5  . levothyroxine (SYNTHROID, LEVOTHROID) 100 MCG tablet Take 1 tablet (100 mcg total) by mouth daily. 90 tablet 3  . omeprazole (PRILOSEC) 40 MG capsule Take 40 mg twice daily for one month ,then decrease to once daily (Patient taking differently: daily as needed. Take 40 mg twice daily for one month ,then decrease to once daily) 90 capsule 11   Current Facility-Administered Medications on File Prior to Visit  Medication Dose Route Frequency Provider Last Rate Last Dose  . 0.9 %  sodium chloride infusion  500 mL Intravenous Once Armbruster, Carlota Raspberry, MD        BP 120/84   Temp 97.6 F (36.4 C)   Wt 172 lb (78 kg)   BMI 28.62 kg/m       Objective:   Physical Exam Vitals signs and nursing note reviewed.  Constitutional:      Appearance: Normal appearance.  Cardiovascular:     Rate and Rhythm: Normal rate and regular rhythm.     Pulses: Normal pulses.     Heart sounds: Normal heart sounds.  Pulmonary:     Effort: Pulmonary effort is normal.     Breath sounds: Normal  breath sounds.  Musculoskeletal: Normal range of motion.        General: Swelling (Trace right knee status post knee replacement) present. No tenderness, deformity or signs of injury.     Right lower leg: No edema.     Left lower leg: No edema.     Comments: No limping gait noted.  Skin:    General: Skin is warm  and dry.  Neurological:     General: No focal deficit present.     Mental Status: She is alert and oriented to person, place, and time.  Psychiatric:        Mood and Affect: Mood normal.        Behavior: Behavior normal.        Thought Content: Thought content normal.        Judgment: Judgment normal.        Assessment & Plan:  1. Lumbar disc disease with radiculopathy - We discussed options and I would like to have her go through PT prior to pain management visit. I doubt we will ever get her pain completely under control but possibly manage it more without medications. - Ambulatory referral to Physical Therapy  [name], NP

## 2018-11-10 ENCOUNTER — Other Ambulatory Visit: Payer: Self-pay | Admitting: Adult Health

## 2018-11-11 NOTE — Telephone Encounter (Signed)
Denied.  Sent to [facility] order for 1 year on 10/29/2018.  Nothing further needed.

## 2018-11-17 ENCOUNTER — Ambulatory Visit: Payer: Medicare HMO | Attending: Adult Health | Admitting: Physical Therapy

## 2018-11-17 ENCOUNTER — Other Ambulatory Visit: Payer: Self-pay

## 2018-11-17 ENCOUNTER — Encounter: Payer: Self-pay | Admitting: Physical Therapy

## 2018-11-17 DIAGNOSIS — G8929 Other chronic pain: Secondary | ICD-10-CM | POA: Insufficient documentation

## 2018-11-17 DIAGNOSIS — R293 Abnormal posture: Secondary | ICD-10-CM | POA: Diagnosis not present

## 2018-11-17 DIAGNOSIS — M5442 Lumbago with sciatica, left side: Secondary | ICD-10-CM | POA: Insufficient documentation

## 2018-11-17 DIAGNOSIS — M5441 Lumbago with sciatica, right side: Secondary | ICD-10-CM | POA: Insufficient documentation

## 2018-11-17 DIAGNOSIS — M6281 Muscle weakness (generalized): Secondary | ICD-10-CM | POA: Diagnosis not present

## 2018-11-17 NOTE — Patient Instructions (Addendum)
  Access Code: [email or web address]  URL: [email or web address]  Date: 11/17/2018  Prepared by: Jari Favre   Exercises  Standing Hamstring Stretch with Step - 3 reps - 1 sets - 30 sec hold - 1x daily - 7x weekly  Seated Pursed Lip Breathing - 10 reps - 3 sets - 1x daily - 7x weekly

## 2018-11-17 NOTE — Therapy (Signed)
[facility] [address] [place], [place], [address] Phone: [phone]   Fax:  [phone]  Physical Therapy Treatment  Patient Details  Name: Stacy Garcia MRN: [id number] Date of Birth: 1951/09/03 Referring Provider (PT): [name], NP   Encounter Date: 11/17/2018  PT End of Session - 11/17/18 1220    Visit Number  1    Number of Visits  10    Date for PT Re-Evaluation  01/12/19    Authorization Type  medicare    PT Start Time  0845    PT Stop Time  0930    PT Time Calculation (min)  45 min    Activity Tolerance  Patient limited by pain    Behavior During Therapy  Anxious;Agitated       Past Medical History:  Diagnosis Date  . AAA (abdominal aortic aneurysm) ([place])   . Arthritis   . COPD (chronic obstructive pulmonary disease) ([place])   . Dyspnea    just recently started wheezing  . GERD (gastroesophageal reflux disease)   . Hyperlipidemia   . Hypothyroidism   . Thyroid disease   . Tobacco abuse   . Vertigo     Past Surgical History:  Procedure Laterality Date  . ABDOMINAL AORTIC ANEURYSM REPAIR N/A 02/05/2017   Procedure: ANEURYSM ABDOMINAL AORTIC REPAIR;  Surgeon: Elam Dutch, MD;  Location: [facility] OR;  Service: Vascular;  Laterality: N/A;  . CESAREAN SECTION     x 3  . JOINT REPLACEMENT    . SHOULDER SURGERY  02/2015  . TOTAL KNEE ARTHROPLASTY Right 01/23/2016   Procedure: TOTAL KNEE ARTHROPLASTY;  Surgeon: Garald Balding, MD;  Location: [facility];  Service: Orthopedics;  Laterality: Right;  . TUBAL LIGATION      There were no vitals filed for this visit.  Subjective Assessment - 11/17/18 0849    Subjective  Patient has had back pain for many years but has gotten bad in the last couple of years.     Pertinent History  COPD, vertigo    Limitations  Standing    How long can you stand comfortably?  10-15 minutes    How long can you walk comfortably?  15-20 minutes    Patient Stated Goals   clean my house     Currently in Pain?  Yes    Pain Score  7    10/10 at worst   Pain Location  Back    Pain Orientation  Right    Pain Type  Chronic pain    Pain Onset  More than a month ago    Pain Frequency  Constant    Aggravating Factors   too long in any position    Pain Relieving Factors  changing positions    Effect of Pain on Daily Activities  can't clean and organize    Multiple Pain Sites  No         [facility] PT Assessment - 11/17/18 0001      Assessment   Medical Diagnosis  M51.16 (ICD-10-CM) - Lumbar disc disease with radiculopathy    Referring Provider (PT)  [name], NP    Onset Date/Surgical Date  --   many years   Prior Therapy  No      Precautions   Precautions  None      Restrictions   Weight Bearing Restrictions  No      Balance Screen   Has the patient fallen in the past 6  months  No      Home Film/video editor residence    Living Arrangements  Spouse/significant other      Prior Function   Level of Independence  Independent      Cognition   Overall Cognitive Status  Within Functional Limits for tasks assessed      Observation/Other Assessments   Focus on Therapeutic Outcomes (FOTO)   64% limited      Posture/Postural Control   Posture/Postural Control  Postural limitations    Postural Limitations  Weight shift left;Rounded Shoulders;Forward head;Increased thoracic kyphosis   lateral shift left     ROM / Strength   AROM / PROM / Strength  AROM;Strength      AROM   AROM Assessment Site  Lumbar    Lumbar Flexion  40 cm from floor, and pain increased and radiates    Lumbar Extension  no pain 50% limited ROM    Lumbar - Right Side Bend  52 cm away from floor + some pain    Lumbar - Left Side Bend  57 cm away from floor + significant pain      Strength   Overall Strength Comments  Rt LE hip and knee 4-/5; Rt LE hip and knee 4/5      Flexibility   Soft Tissue Assessment /Muscle Length  yes    Hamstrings  30%  limited;       Special Tests    Special Tests  Lumbar    Lumbar Tests  Slump Test      Slump test   Findings  Unable to test    Comment  pain in hamstring bilaterally unable to differentiate      Ambulation/Gait   Gait Pattern  Decreased stride length;Trunk flexed                   [facility] Adult PT Treatment/Exercise - 11/17/18 0001      Self-Care   Self-Care  Other Self-Care Comments    Other Self-Care Comments   educated and demonstrate initial HEP             PT Education - 11/17/18 1219    Education Details   Access Code: [email or web address]     Person(s) Educated  Patient    Methods  Explanation;Demonstration;Handout;Verbal cues    Comprehension  Verbalized understanding;Returned demonstration       PT Short Term Goals - 11/17/18 1321      PT SHORT TERM GOAL #1   Title  pt will be I with basic HEP    Time  4    Period  Weeks    Status  New    Target Date  12/15/18      PT SHORT TERM GOAL #2   Title  She will report she can stand for at least 20 minutes doing chores.    Time  4    Period  Weeks    Status  New    Target Date  12/15/18      PT SHORT TERM GOAL #3   Title  .Marland Kitchen      PT SHORT TERM GOAL #4   Title  .Marland Kitchen        PT Long Term Goals - 11/17/18 1317      PT LONG TERM GOAL #1   Title  She will be I upon discharge with all HEP given throughout PT     Time  8    Period  Weeks    Status  New    Target Date  01/12/19      PT LONG TERM GOAL #2   Title  Pt will be able to stand and do housework for at least 30 minutes due to reduced pain    Time  8    Period  Weeks    Status  New    Target Date  01/12/19      PT LONG TERM GOAL #3   Title  Pt will demonstrate improved lumbar ROM foward flexion to < or = to 30 cm away from the floor so she can improve reaching down to the floor for cleaning her house    Time  8    Period  Weeks    Status  New    Target Date  01/12/19      PT LONG TERM GOAL #4   Title  Pt will report 50% greater ability  to tolerate activities due to ability to manage her pain    Time  8    Period  Weeks    Status  New    Target Date  01/12/19      PT LONG TERM GOAL #5   Title  She will increase her FOTO score to < or = to  49% limited upon discharge to assist with increased funcitonal capacity     Time  8    Period  Weeks    Status  New    Target Date  01/12/19            Plan - 11/17/18 1223    Clinical Impression Statement  Pt has chronic back pain with imaging that shows potential lumbar disc protrusion L3-4 and L4-5; shows convex scoliosis Lt lumbar.  Pt has comorbitity that limited evaluation such as high irritibility and unable to lie flat due to vertigo.  Pt has reduced lumbar ROM and hamstring flexiblity.  Pt has bil LE weakness Rt > Lt.  Pt also demonstrates shallow breathing pattern.  She will benefit from skilled PT to address impairments as well as give education on chronic pain management for improved abilty to perform functional activities.    Personal Factors and Comorbidities  Age;Behavior Pattern;Comorbidity 3+;Past/Current Experience    Comorbidities  vertigo, COPD, Rt knee pain, chronic pain    Examination-Activity Limitations  Lift;Stand;Dressing;Bend    Examination-Participation Restrictions  Meal Prep;Community Activity;Driving;Laundry;Cleaning    Stability/Clinical Decision Making  Evolving/Moderate complexity    Clinical Decision Making  High    Rehab Potential  Good   probably only able to do 1x   PT Frequency  2x / week    PT Duration  8 weeks    PT Treatment/Interventions  ADLs/Self Care Home Management;Biofeedback;Cryotherapy;Electrical Stimulation;Moist Heat;Traction;Therapeutic activities;Therapeutic exercise;Neuromuscular re-education;Patient/family education;Passive range of motion;Manual techniques;Dry needling;Taping    PT Next Visit Plan  gentle ROM in semi-reclined, hamstring stretch, lumbar flexion    PT Home Exercise Plan   Access Code: [email or web address]     Consulted  and Agree with Plan of Care  Patient       Patient will benefit from skilled therapeutic intervention in order to improve the following deficits and impairments:  Abnormal gait, Decreased range of motion, Increased fascial restricitons, Increased muscle spasms, Decreased strength, Pain, Impaired flexibility, Postural dysfunction  Visit Diagnosis: No diagnosis found.     Problem List Patient Active Problem List   Diagnosis Date Noted  . COPD exacerbation ([place]) 06/03/2018  . Hematuria 06/03/2018  . Vaginitis  and vulvovaginitis 02/03/2018  . Lumbar disc disease with radiculopathy 12/15/2017  . Gastroesophageal reflux disease 11/13/2017  . S/P AAA repair 11/13/2017  . Special screening for malignant neoplasms, colon 11/13/2017  . AAA (abdominal aortic aneurysm) ([place]) 02/05/2017  . Primary osteoarthritis of right knee 01/23/2016  . S/P total knee replacement using cement 01/23/2016  . Peptic ulcer associated with Helicobacter pylori infection 05/03/2014  . Pain of left leg 04/28/2014  . AAA (abdominal aortic aneurysm) without rupture ([place]) 03/08/2013  . Unilateral occipital headache 04/20/2012  . Left knee pain 11/19/2010  . SHOULDER PAIN, BILATERAL 07/17/2010  . WRIST PAIN, RIGHT 02/15/2010  . COPD (chronic obstructive pulmonary disease) ([place]) 08/01/2008  . HYPOTHYROIDISM, POST-RADIOACTIVE IODINE 07/20/2007  . TOBACCO ABUSE 07/20/2007  . HOT FLASHES 07/20/2007  . BENIGN POSITIONAL VERTIGO, HX OF 07/20/2007  . INFLAMMATORY DISEASE OF BREAST 07/19/2007  . FIBROCYSTIC BREAST DISEASE 04/01/2007    Jule Ser, PT 11/17/2018, 1:22 PM  [facility] [address] [place], [place], [address] Phone: [phone]   Fax:  [phone]  Name: Stacy Garcia MRN: [id number] Date of Birth: [birth year]

## 2018-12-03 ENCOUNTER — Encounter: Payer: Medicare HMO | Admitting: Physical Therapy

## 2018-12-05 IMAGING — DX DG CHEST 2V
2 series · 2 of 2 positions shown · non-contrast
Comparison: Chest x-ray of October 16, 2015

CLINICAL DATA: Preoperative examination prior to abdominal surgery.
History of COPD, current smoker.

EXAM:
CHEST  2 VIEW

[chest pa]
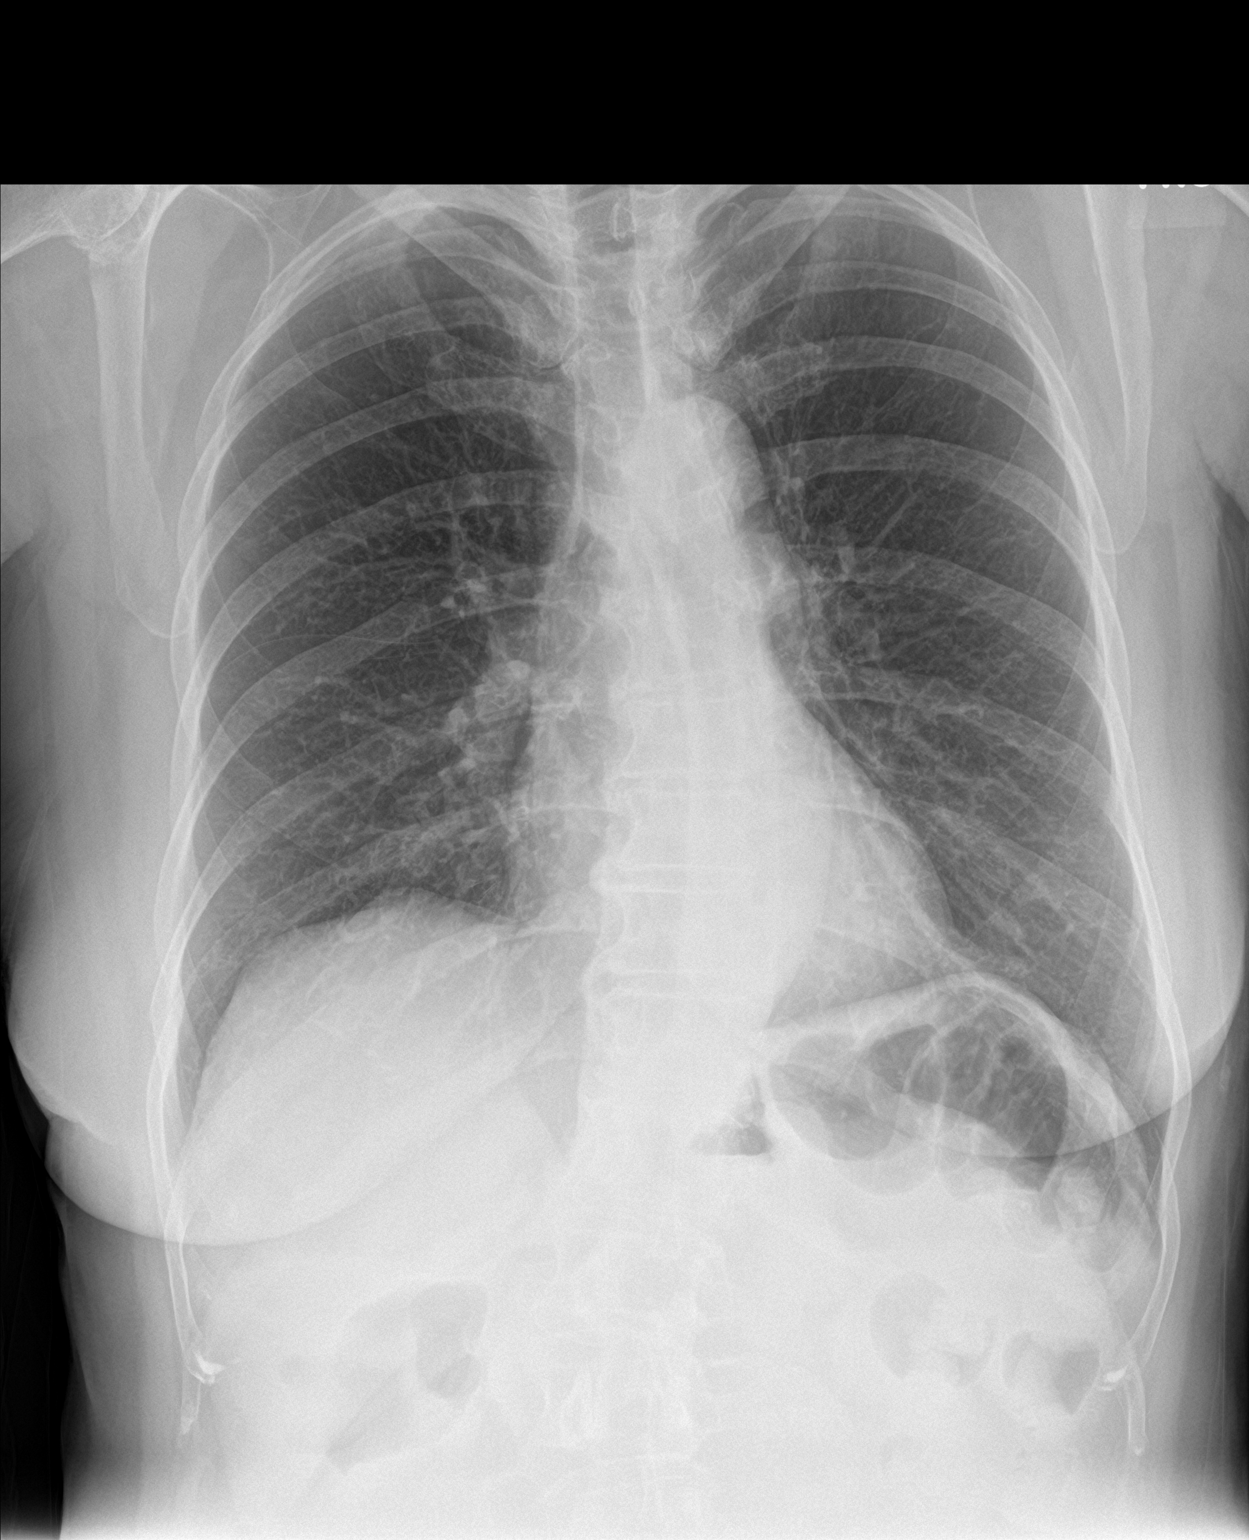

[chest lat]
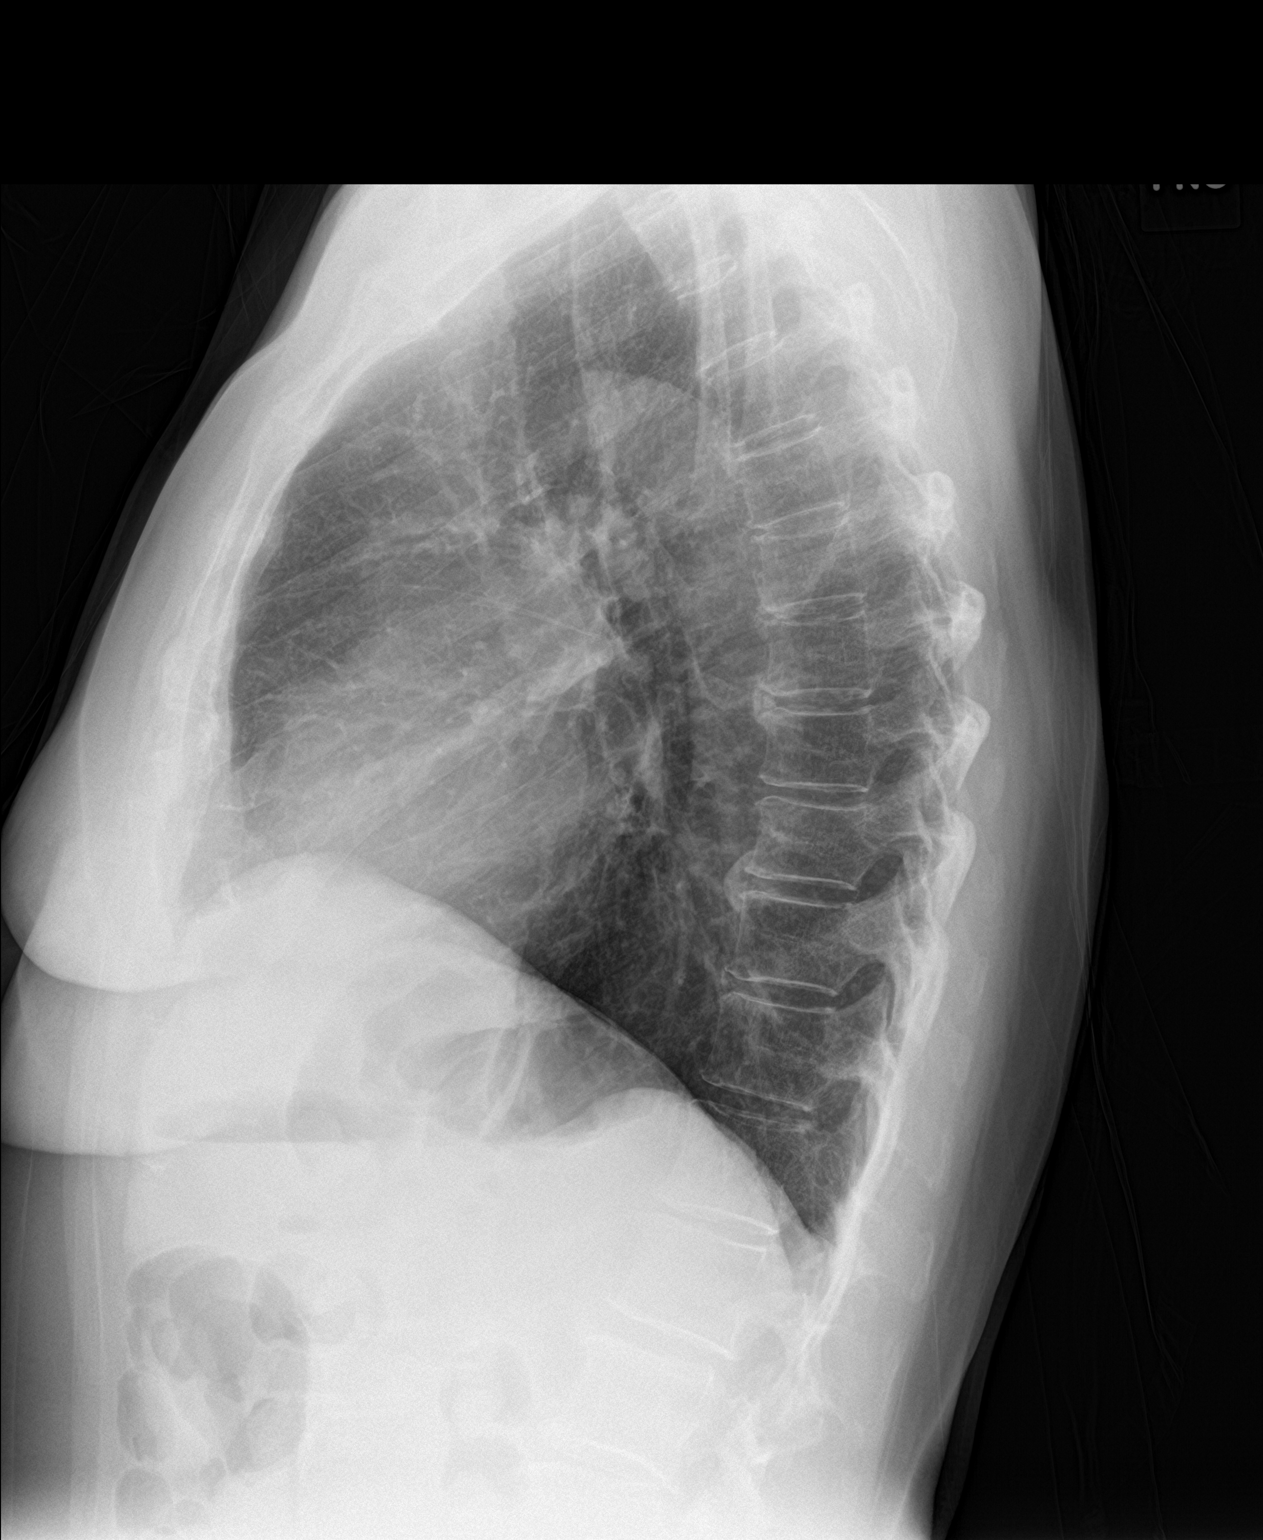

[2 of 2 positions shown; findings below may reference images not displayed]

FINDINGS: The lungs are well-expanded. There is no focal infiltrate. There is
no pleural effusion. The heart and pulmonary vascularity are normal.
The mediastinum is normal in width. The trachea is midline. The bony
thorax exhibits no acute abnormality.
IMPRESSION: There is no active cardiopulmonary disease.

## 2018-12-10 ENCOUNTER — Encounter: Payer: Medicare HMO | Admitting: Physical Therapy

## 2018-12-13 ENCOUNTER — Other Ambulatory Visit: Payer: Self-pay | Admitting: Adult Health

## 2018-12-14 NOTE — Telephone Encounter (Signed)
DENIED.  SENT TO [facility] IN FEB 2020.  MESSAGE SENT TO THE PHARMACY.

## 2018-12-15 ENCOUNTER — Encounter: Payer: Medicare HMO | Admitting: Physical Therapy

## 2018-12-22 ENCOUNTER — Other Ambulatory Visit: Payer: Self-pay | Admitting: Adult Health

## 2019-01-31 IMAGING — DX DG ABD PORTABLE 1V
1 series · 1 of 1 positions shown · non-contrast
Comparison: CT abdomen 11/28/2016

CLINICAL DATA: AAA repair

EXAM:
PORTABLE ABDOMEN - 1 VIEW

[abdomen]
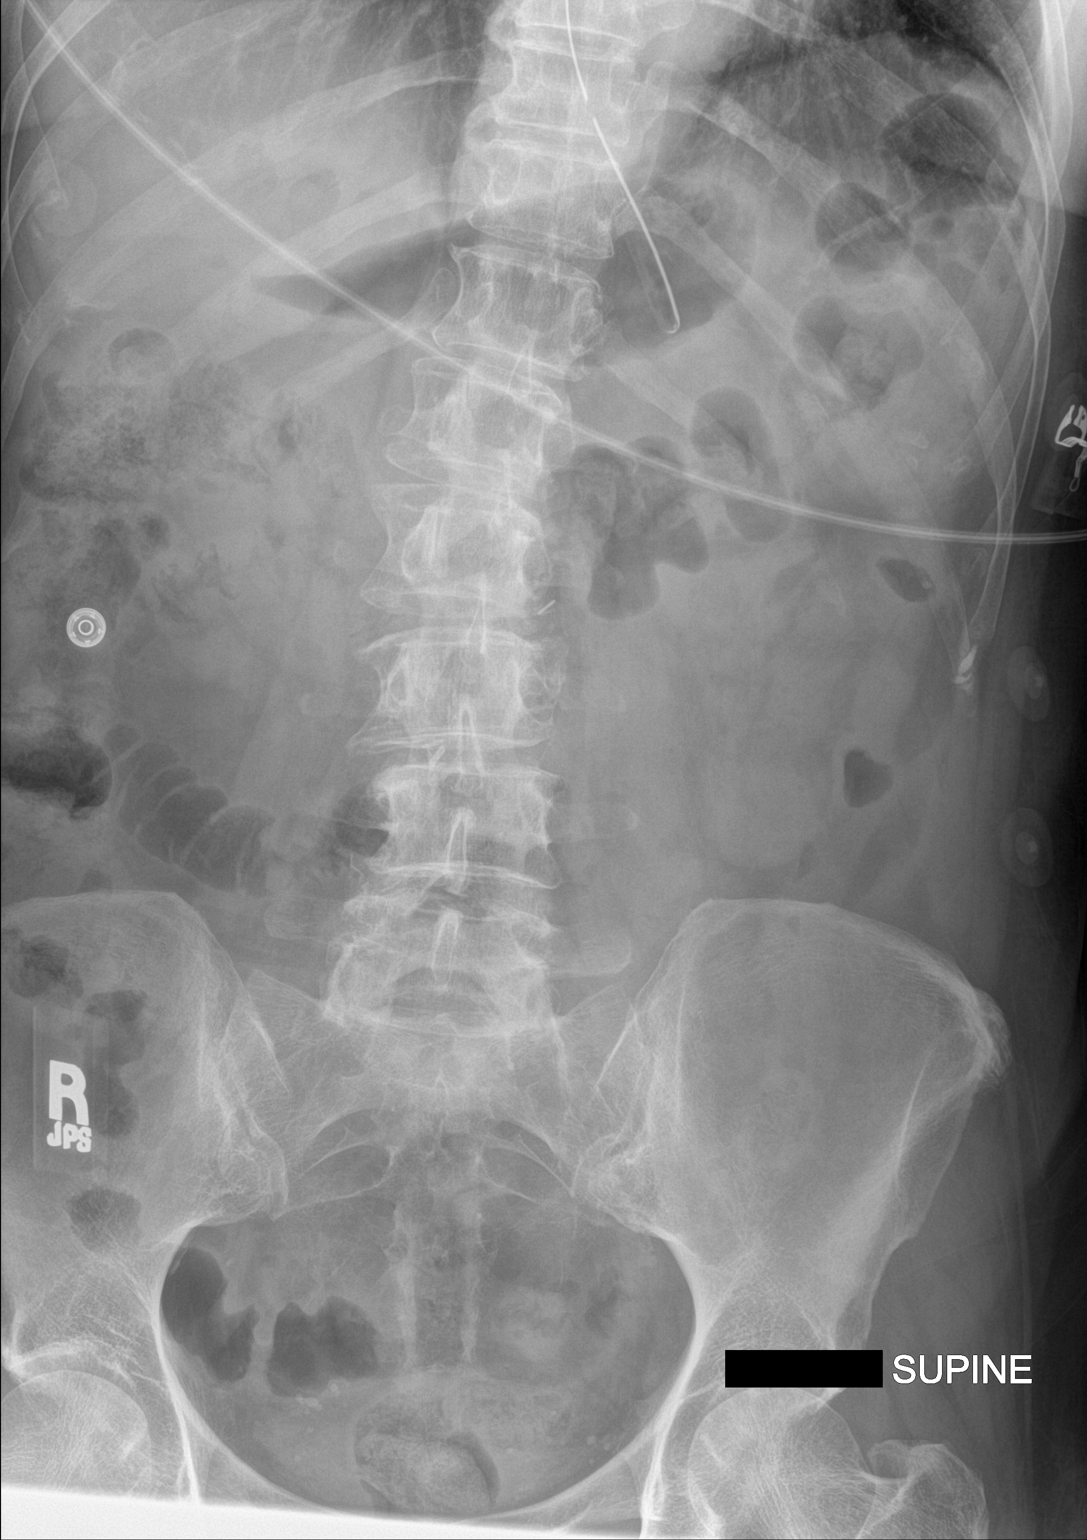

[1 of 1 positions shown; findings below may reference images not displayed]

FINDINGS: Nasogastric tube with the proximal port above the esophagogastric
junction, recommend advancing the tube 8 cm. There is no bowel
dilatation to suggest obstruction. There is a moderate amount of
stool in the ascending colon. There is no evidence of
pneumoperitoneum, portal venous gas or pneumatosis. There are no
pathologic calcifications along the expected course of the ureters.
There is dextrocurvature of the thoracolumbar spine.
IMPRESSION: 1. Nasogastric tube with the proximal port above the esophagogastric
junction, recommend advancing the tube 8 cm.

## 2019-02-11 ENCOUNTER — Other Ambulatory Visit: Payer: Self-pay | Admitting: Gastroenterology

## 2019-03-08 ENCOUNTER — Other Ambulatory Visit: Payer: Self-pay

## 2019-03-08 ENCOUNTER — Encounter: Payer: Self-pay | Admitting: Family Medicine

## 2019-03-08 ENCOUNTER — Ambulatory Visit (INDEPENDENT_AMBULATORY_CARE_PROVIDER_SITE_OTHER): Payer: Medicare HMO | Admitting: Family Medicine

## 2019-03-08 VITALS — BP 130/86 | HR 88 | Temp 97.4°F | Ht 64.5 in | Wt 171.7 lb

## 2019-03-08 DIAGNOSIS — M25562 Pain in left knee: Secondary | ICD-10-CM

## 2019-03-08 NOTE — Progress Notes (Signed)
Subjective:     Patient ID: Stacy Garcia, female   DOB: 08-May-1951, 68 y.o.   MRN: [id number]  HPI Patient is seen for acute left knee pain.  Onset about 3 days ago.  No known injury.  She does have to navigate several stairs at her home.  She had pain mostly going down stairs.  Pain is somewhat poorly localized and is both anterior and posterior.  She does not note any bruising, redness, or warmth.  No history of gout or pseudogout.  She has had prior history of right total knee replacement.  No locking or giving way.  She is tried some Advil and Aleve with minimal relief.  Past Medical History:  Diagnosis Date  . AAA (abdominal aortic aneurysm) ([place])   . Arthritis   . COPD (chronic obstructive pulmonary disease) ([place])   . Dyspnea    just recently started wheezing  . GERD (gastroesophageal reflux disease)   . Hyperlipidemia   . Hypothyroidism   . Thyroid disease   . Tobacco abuse   . Vertigo    Past Surgical History:  Procedure Laterality Date  . ABDOMINAL AORTIC ANEURYSM REPAIR N/A 02/05/2017   Procedure: ANEURYSM ABDOMINAL AORTIC REPAIR;  Surgeon: Elam Dutch, MD;  Location: [facility] OR;  Service: Vascular;  Laterality: N/A;  . CESAREAN SECTION     x 3  . JOINT REPLACEMENT    . SHOULDER SURGERY  02/2015  . TOTAL KNEE ARTHROPLASTY Right 01/23/2016   Procedure: TOTAL KNEE ARTHROPLASTY;  Surgeon: Garald Balding, MD;  Location: [facility];  Service: Orthopedics;  Laterality: Right;  . TUBAL LIGATION      reports that she quit smoking about 2 years ago. Her smoking use included cigarettes. She has a 50.00 pack-year smoking history. She has never used smokeless tobacco. She reports that she does not drink alcohol or use drugs. family history includes Diabetes in an other family member; Hyperlipidemia in an other family member; Hypertension in an other family member; Thyroid disease in an other family member. Allergies  Allergen Reactions  . No Known Allergies      Review of Systems   Constitutional: Negative for chills and fever.  Respiratory: Negative for cough and shortness of breath.   Neurological: Negative for weakness and numbness.       Objective:   Physical Exam Constitutional:      Appearance: Normal appearance.  Cardiovascular:     Rate and Rhythm: Normal rate.  Pulmonary:     Effort: Pulmonary effort is normal.     Breath sounds: Normal breath sounds.  Musculoskeletal:     Comments: Left knee no redness.  No ecchymosis.  No increased warmth.  No significant effusion.  No localizing tenderness.  Ligament testing is normal  Neurological:     Mental Status: She is alert.        Assessment:     Acute left knee pain.  She does not show any effusion at this time and pain is very nonlocalized.  She has not had any erythema or warmth or other signs of acute inflammation.  ?meniscal.      Plan:     -We discussed possible x-rays but our machine is currently down.  She was not interested in going to another site at this time -We recommend icing 15 to 20 minutes 3 times daily -Continue over-the-counter Aleve or Advil as needed -Follow-up in 1 to 2 weeks if not improving  Eulas Post MD [facility]  at [facility]

## 2019-03-08 NOTE — Patient Instructions (Signed)
Continue to ice knee 20 minutes three times daily  Follow up in 1-2 weeks if no better.

## 2019-03-11 ENCOUNTER — Other Ambulatory Visit: Payer: Self-pay | Admitting: Gastroenterology

## 2019-04-05 ENCOUNTER — Ambulatory Visit: Payer: Self-pay

## 2019-04-05 NOTE — Telephone Encounter (Signed)
Complains abdominal pain x 2-3 months ago. Keep her up at night. Pt stated pain is a 8/10 which is worse with movement. Pt stated that id she rubs that area (left abdomen) it feels better. Otherwise pain is a shooting pin midway to stomach to breast. Pt stated her stomach looks bigger but contributes to weight gain. Only weight gain is her middle. Pt with a h/o chronic vertigo. Last BM Saturday but pt feels it is not constipation. Pt has a h/o AAA.  Advised pt to go to ED and pt refused stating that she just wants to see Tommi Rumps and get and xray, I don't want to go to the ER with al of this mess."  Explained to pt that she is at high risk for complications and may need a CT or an intervention that cannot be done in the office.  Was asked to send note high priority to office for nurse to review. Advised pt to be NPO. Reason for Disposition . [1] SEVERE pain AND [2] age > 66  Answer Assessment - Initial Assessment Questions 1. LOCATION: "Where does it hurt?"      Left abdomen 2. RADIATION: "Does the pain shoot anywhere else?" (e.g., chest, back)    Midway to stomach to breast area 3. ONSET: "When did the pain begin?" (e.g., minutes, hours or days ago)      2-76months 4. SUDDEN: "Gradual or sudden onset?"     gradually 5. PATTERN "Does the pain come and go, or is it constant?"    - If constant: "Is it getting better, staying the same, or worsening?"      (Note: Constant means the pain never goes away completely; most serious pain is constant and it progresses)     - If intermittent: "How long does it last?" "Do you have pain now?"     (Note: Intermittent means the pain goes away completely between bouts)     worsening 6. SEVERITY: "How bad is the pain?"  (e.g., Scale 1-10; mild, moderate, or severe)   - MILD (1-3): doesn't interfere with normal activities, abdomen soft and not tender to touch    - MODERATE (4-7): interferes with normal activities or awakens from sleep, tender to touch    - SEVERE  (8-10): excruciating pain, doubled over, unable to do any normal activities      severe 7. RECURRENT SYMPTOM: "Have you ever had this type of abdominal pain before?" If so, ask: "When was the last time?" and "What happened that time?"      no 8. CAUSE: "What do you think is causing the abdominal pain?"     Pt does not know 9. RELIEVING/AGGRAVATING FACTORS: "What makes it better or worse?" (e.g., movement, antacids, bowel movement)     Standing makes it worse and and rubbing it makes it worse 10. OTHER SYMPTOMS: "Has there been any vomiting, diarrhea, constipation, or urine problems?"       no 11. PREGNANCY: "Is there any chance you are pregnant?" "When was your last menstrual period?"       n/a  Protocols used: ABDOMINAL PAIN - [employer]

## 2019-04-05 NOTE — Telephone Encounter (Signed)
Spoke with patient. Patient again refused to go to ED. Offered appointment with PCP and patient agreed. Patient scheduled for 04/06/2019 at 11:30 AM. Patient advised if sx worsen she will need to go to ED. Patient verbalized understanding.

## 2019-04-06 ENCOUNTER — Ambulatory Visit (INDEPENDENT_AMBULATORY_CARE_PROVIDER_SITE_OTHER): Payer: Medicare HMO

## 2019-04-06 ENCOUNTER — Other Ambulatory Visit: Payer: Self-pay

## 2019-04-06 ENCOUNTER — Ambulatory Visit (INDEPENDENT_AMBULATORY_CARE_PROVIDER_SITE_OTHER): Payer: Medicare HMO | Admitting: Adult Health

## 2019-04-06 ENCOUNTER — Encounter: Payer: Self-pay | Admitting: Adult Health

## 2019-04-06 VITALS — BP 140/82 | Temp 97.6°F | Wt 168.0 lb

## 2019-04-06 DIAGNOSIS — R1032 Left lower quadrant pain: Secondary | ICD-10-CM

## 2019-04-06 LAB — COMPREHENSIVE METABOLIC PANEL
ALT: 22 U/L (ref 0–35)
AST: 19 U/L (ref 0–37)
Albumin: 4.4 g/dL (ref 3.5–5.2)
Alkaline Phosphatase: 101 U/L (ref 39–117)
BUN: 13 mg/dL (ref 6–23)
CO2: 29 mEq/L (ref 19–32)
Calcium: 9.6 mg/dL (ref 8.4–10.5)
Chloride: 103 mEq/L (ref 96–112)
Creatinine, Ser: 0.9 mg/dL (ref 0.40–1.20)
GFR: 61.81 mL/min (ref >60.00)
Glucose, Bld: 81 mg/dL (ref 70–99)
Potassium: 4.3 mEq/L (ref 3.5–5.1)
Sodium: 140 mEq/L (ref 135–145)
Total Bilirubin: 0.9 mg/dL (ref 0.2–1.2)
Total Protein: 6.7 g/dL (ref 6.0–8.3)

## 2019-04-06 LAB — BASIC METABOLIC PANEL
BUN: 13 mg/dL (ref 6–23)
CO2: 29 mEq/L (ref 19–32)
Calcium: 9.6 mg/dL (ref 8.4–10.5)
Chloride: 103 mEq/L (ref 96–112)
Creatinine, Ser: 0.9 mg/dL (ref 0.40–1.20)
GFR: 61.81 mL/min (ref >60.00)
Glucose, Bld: 81 mg/dL (ref 70–99)
Potassium: 4.3 mEq/L (ref 3.5–5.1)
Sodium: 140 mEq/L (ref 135–145)

## 2019-04-06 LAB — POCT URINALYSIS DIPSTICK
Bilirubin, UA: NEGATIVE
Glucose, UA: NEGATIVE
Ketones, UA: NEGATIVE
Odor: NEGATIVE
Protein, UA: NEGATIVE
Spec Grav, UA: 1.025 (ref 1.010–1.025)
Urobilinogen, UA: 1 E.U./dL
pH, UA: 5.5 (ref 5.0–8.0)

## 2019-04-06 NOTE — Progress Notes (Signed)
Subjective:    Patient ID: Stacy Garcia, female    DOB: Mar 28, 1951, 68 y.o.   MRN: [id number]  HPI 68 year old female who is being evaluated today for left lower quadrant abdominal pain.  She reports that this pain has been present intermittently over the last 4 to 5 months.  Pain is described as "a baby being lodged in your abdomen wrong way".  Pain does not last more than a few moments when it does happen.  She denies fevers, chills, nausea, vomiting, diarrhea, or constipation past baseline.  He has not noticed any blood in her stool.  She denies UTI-like symptoms.  She does have a history of repaired aortic aneurysm   Review of Systems  Constitutional: Negative.   HENT: Negative.   Eyes: Negative.   Respiratory: Negative.   Cardiovascular: Negative.   Gastrointestinal: Positive for abdominal pain.  Endocrine: Negative.   Genitourinary: Negative.   Musculoskeletal: Negative.   Neurological: Negative.   Hematological: Negative.   All other systems reviewed and are negative.  Past Medical History:  Diagnosis Date  . AAA (abdominal aortic aneurysm) ([place])   . Arthritis   . COPD (chronic obstructive pulmonary disease) ([place])   . Dyspnea    just recently started wheezing  . GERD (gastroesophageal reflux disease)   . Hyperlipidemia   . Hypothyroidism   . Thyroid disease   . Tobacco abuse   . Vertigo     Social History   Socioeconomic History  . Marital status: Married    Spouse name: Not on file  . Number of children: 3  . Years of education: Not on file  . Highest education level: Not on file  Occupational History  . Occupation: Designer/manufacturing    Comment: Librarian, academic; retired 2017  Social Needs  . Financial resource strain: Somewhat hard  . Food insecurity    Worry: Never true    Inability: Never true  . Transportation needs    Medical: No    Non-medical: No  Tobacco Use  . Smoking status: Former Smoker    Packs/day: 1.00    Years: 50.00   Pack years: 50.00    Types: Cigarettes    Quit date: 12/31/2016    Years since quitting: 2.2  . Smokeless tobacco: Never Used  Substance and Sexual Activity  . Alcohol use: No    Alcohol/week: 0.0 standard drinks  . Drug use: No  . Sexual activity: Not Currently  Lifestyle  . Physical activity    Days per week: 0 days    Minutes per session: 0 min  . Stress: Only a little  Relationships  . Social connections    Talks on phone: More than three times a week    Gets together: More than three times a week    Attends religious service: Not on file    Active member of club or organization: No    Attends meetings of clubs or organizations: Never    Relationship status: Married  . Intimate partner violence    Fear of current or ex partner: No    Emotionally abused: No    Physically abused: No    Forced sexual activity: No  Other Topics Concern  . Not on file  Social History Narrative   11/03/2018:   Lives with husband in 2 story home   Has 3 children, 7 grandchildren, 24 great-grandchildren, all of whom live close by   Takes care of two of her great grandchildren (54 yo and 48yo)  [place] working, 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    Past Surgical History:  Procedure Laterality Date  . ABDOMINAL AORTIC ANEURYSM REPAIR N/A 02/05/2017   Procedure: ANEURYSM ABDOMINAL AORTIC REPAIR;  Surgeon: Elam Dutch, MD;  Location: [facility] OR;  Service: Vascular;  Laterality: N/A;  . CESAREAN SECTION     x 3  . JOINT REPLACEMENT    . SHOULDER SURGERY  02/2015  . TOTAL KNEE ARTHROPLASTY Right 01/23/2016   Procedure: TOTAL KNEE ARTHROPLASTY;  Surgeon: Garald Balding, MD;  Location: [facility];  Service: Orthopedics;  Laterality: Right;  . TUBAL LIGATION      Family History  Problem Relation Age of Onset  . Thyroid disease Other   . Diabetes Other   . Hyperlipidemia Other   . Hypertension Other     Allergies  Allergen Reactions  . No Known  Allergies     Current Outpatient Medications on File Prior to Visit  Medication Sig Dispense Refill  . albuterol (VENTOLIN HFA) 108 (90 Base) MCG/ACT inhaler Inhale 2 puffs into the lungs every 6 (six) hours as needed for wheezing or shortness of breath. 1 Inhaler 5  . aspirin 325 MG EC tablet Take 325 mg by mouth daily.    Marland Kitchen atorvastatin (LIPITOR) 40 MG tablet Take 1 tablet (40 mg total) by mouth daily. 90 tablet 3  . diazepam (VALIUM) 5 MG tablet TAKE 1/2 TO 1 TABLET BY MOUTH TWICE A DAY AS NEEDED 30 tablet 2  . dicyclomine (BENTYL) 20 MG tablet TAKE 1 TABLET BY MOUTH TWICE A DAY (Patient taking differently: daily as needed. ) 60 tablet 2  . Fluticasone-Umeclidin-Vilant (TRELEGY ELLIPTA) 100-62.5-25 MCG/INH AEPB Inhale 1 puff into the lungs daily. 28 each 5  . levothyroxine (SYNTHROID, LEVOTHROID) 100 MCG tablet Take 1 tablet (100 mcg total) by mouth daily. 90 tablet 3  . omeprazole (PRILOSEC) 40 MG capsule Take 1 capsule (40 mg total) by mouth daily. Please schedule a yearly F/U appt for further refills: [phone], #2 30 capsule 0   Current Facility-Administered Medications on File Prior to Visit  Medication Dose Route Frequency Provider Last Rate Last Dose  . 0.9 %  sodium chloride infusion  500 mL Intravenous Once Armbruster, Carlota Raspberry, MD        BP 140/82   Temp 97.6 F (36.4 C)   Wt 168 lb (76.2 kg)   BMI 28.39 kg/m       Objective:   Physical Exam Vitals signs and nursing note reviewed.  Constitutional:      Appearance: She is well-developed.  Cardiovascular:     Rate and Rhythm: Normal rate and regular rhythm.  Pulmonary:     Effort: Pulmonary effort is normal.     Breath sounds: Normal breath sounds.  Abdominal:     General: Abdomen is flat. A surgical scar is present. Bowel sounds are normal. There is no distension or abdominal bruit.     Palpations: Abdomen is soft.     Tenderness: There is abdominal tenderness in the left lower quadrant. There is no right CVA  tenderness, left CVA tenderness, guarding or rebound. Negative signs include McBurney's sign and obturator sign.  Genitourinary:    Cervix: Normal and dilated.     Uterus: Absent.      Adnexa: Right adnexa normal and left adnexa normal.  Skin:    General: Skin is warm and dry.  Neurological:     Mental Status: She is alert.  Assessment & Plan:  1. LLQ pain - Unknown cause. Do not think diverticulitis. Possible constipation  - DG Abd 1 View; Future - POC Urinalysis Dipstick - Basic Metabolic Panel - CMP - Consider CT in the future  [name], NP

## 2019-04-08 ENCOUNTER — Other Ambulatory Visit: Payer: Self-pay | Admitting: Adult Health

## 2019-04-08 DIAGNOSIS — N3000 Acute cystitis without hematuria: Secondary | ICD-10-CM

## 2019-04-08 LAB — URINE CULTURE
MICRO NUMBER:: 712255
SPECIMEN QUALITY:: ADEQUATE

## 2019-04-08 MED ORDER — AMOXICILLIN-POT CLAVULANATE 875-125 MG PO TABS: 1.0000 | ORAL_TABLET | Freq: Two times a day (BID) | ORAL | 0 refills | Status: AC

## 2019-04-09 ENCOUNTER — Other Ambulatory Visit: Payer: Self-pay | Admitting: Family Medicine

## 2019-04-13 MED ORDER — DIAZEPAM 5 MG PO TABS: ORAL_TABLET | ORAL | 2 refills | Status: DC

## 2019-05-21 ENCOUNTER — Telehealth: Payer: Self-pay | Admitting: Adult Health

## 2019-05-21 ENCOUNTER — Telehealth (INDEPENDENT_AMBULATORY_CARE_PROVIDER_SITE_OTHER): Payer: Medicare HMO | Admitting: Adult Health

## 2019-05-21 ENCOUNTER — Other Ambulatory Visit: Payer: Self-pay

## 2019-05-21 DIAGNOSIS — J439 Emphysema, unspecified: Secondary | ICD-10-CM | POA: Diagnosis not present

## 2019-05-21 DIAGNOSIS — M5116 Intervertebral disc disorders with radiculopathy, lumbar region: Secondary | ICD-10-CM

## 2019-05-21 MED ORDER — HYDROCODONE-ACETAMINOPHEN 7.5-325 MG PO TABS: 1.0000 | ORAL_TABLET | Freq: Four times a day (QID) | ORAL | 0 refills | Status: DC | PRN

## 2019-05-21 MED ORDER — DIAZEPAM 5 MG PO TABS: ORAL_TABLET | ORAL | 0 refills | Status: DC

## 2019-05-21 NOTE — Telephone Encounter (Signed)
Please call pt and let her know Misty advised she can schedule virtual appt in the 3:30 slot - this has been placed on hold.

## 2019-05-21 NOTE — Telephone Encounter (Signed)
Patient is calling to see if she can get an appt for back pain and left leg pain.  Please advise Thank you [phone]

## 2019-05-21 NOTE — Telephone Encounter (Signed)
Patient scheduled a virtual appt for today at 3:30 per Misty's advice.

## 2019-05-21 NOTE — Progress Notes (Signed)
Virtual Visit via Video Note  I connected with Pearlean Brownie on 05/21/19 at  3:30 PM EDT by a video enabled telemedicine application and verified that I am speaking with the correct person using two identifiers.  Location patient: home Location provider:work or home office Persons participating in the virtual visit: patient, provider  I discussed the limitations of evaluation and management by telemedicine and the availability of in person appointments. The patient expressed understanding and agreed to proceed.   HPI: 68 year old female who is being evaluated today for a chronic issue of low back pain.  She reports that over the last few weeks her low back pain is become worse she has developed "numbness and tingling my left leg.  She has been evaluated in the past by neurosurgery and refuses to have any more surgeries or epidural injections.  Last MRI in June 2019 showed  IMPRESSION: Potentially symptomatic RIGHT-sided neural impingement at L3-4 and L4-5. See comments above. Correlate clinically for RIGHT L3, L4, and or L5 neural impingement.  Central and leftward protrusions at L1-2 and L2-3 could contribute to LEFT-sided symptomatology if present.  She denies lower extremity edema, issues with bowel or bladder, or inability to walk.  Walking and changing positions makes the pain worse but she does have pain with rest as well.  She is unable to take prednisone any longer because she had an allergic reaction where her face became flushed and swollen  Additionally, she reports that she needs short course refill of Valium as her supply from mail order medications has not come in yet.   ROS: See pertinent positives and negatives per HPI.  Past Medical History:  Diagnosis Date  . AAA (abdominal aortic aneurysm) ([place])   . Arthritis   . COPD (chronic obstructive pulmonary disease) ([place])   . Dyspnea    just recently started wheezing  . GERD (gastroesophageal reflux disease)   .  Hyperlipidemia   . Hypothyroidism   . Thyroid disease   . Tobacco abuse   . Vertigo     Past Surgical History:  Procedure Laterality Date  . ABDOMINAL AORTIC ANEURYSM REPAIR N/A 02/05/2017   Procedure: ANEURYSM ABDOMINAL AORTIC REPAIR;  Surgeon: Elam Dutch, MD;  Location: [facility] OR;  Service: Vascular;  Laterality: N/A;  . CESAREAN SECTION     x 3  . JOINT REPLACEMENT    . SHOULDER SURGERY  02/2015  . TOTAL KNEE ARTHROPLASTY Right 01/23/2016   Procedure: TOTAL KNEE ARTHROPLASTY;  Surgeon: Garald Balding, MD;  Location: [facility];  Service: Orthopedics;  Laterality: Right;  . TUBAL LIGATION      Family History  Problem Relation Age of Onset  . Thyroid disease Other   . Diabetes Other   . Hyperlipidemia Other   . Hypertension Other       Current Outpatient Medications:  .  albuterol (VENTOLIN HFA) 108 (90 Base) MCG/ACT inhaler, Inhale 2 puffs into the lungs every 6 (six) hours as needed for wheezing or shortness of breath., Disp: 1 Inhaler, Rfl: 5 .  aspirin 325 MG EC tablet, Take 325 mg by mouth daily., Disp: , Rfl:  .  atorvastatin (LIPITOR) 40 MG tablet, Take 1 tablet (40 mg total) by mouth daily., Disp: 90 tablet, Rfl: 3 .  diazepam (VALIUM) 5 MG tablet, TAKE 1/2 TO 1 TABLET BY MOUTH TWICE A DAY AS NEEDED, Disp: 30 tablet, Rfl: 2 .  dicyclomine (BENTYL) 20 MG tablet, TAKE 1 TABLET BY MOUTH TWICE A DAY (Patient taking differently:  daily as needed. ), Disp: 60 tablet, Rfl: 2 .  Fluticasone-Umeclidin-Vilant (TRELEGY ELLIPTA) 100-62.5-25 MCG/INH AEPB, Inhale 1 puff into the lungs daily., Disp: 28 each, Rfl: 5 .  levothyroxine (SYNTHROID, LEVOTHROID) 100 MCG tablet, Take 1 tablet (100 mcg total) by mouth daily., Disp: 90 tablet, Rfl: 3 .  omeprazole (PRILOSEC) 40 MG capsule, Take 1 capsule (40 mg total) by mouth daily. Please schedule a yearly F/U appt for further refills: [phone], #2, Disp: 30 capsule, Rfl: 0  Current Facility-Administered Medications:  .  0.9 %  sodium  chloride infusion, 500 mL, Intravenous, Once, Armbruster, Carlota Raspberry, MD  EXAM:  VITALS per patient if applicable:  GENERAL: alert, oriented, appears well and in no acute distress  HEENT: atraumatic, conjunttiva clear, no obvious abnormalities on inspection of external nose and ears  NECK: normal movements of the head and neck  LUNGS: on inspection no signs of respiratory distress, breathing rate appears normal, no obvious gross SOB, gasping or wheezing  CV: no obvious cyanosis  MS: moves all visible extremities without noticeable abnormality  PSYCH/NEURO: pleasant and cooperative, no obvious depression or anxiety, speech and thought processing grossly intact  ASSESSMENT AND PLAN:  Discussed the following assessment and plan:  1. Lumbar disc disease with radiculopathy -We will prescribe a short course of Norco.  She was advised to follow-up in the emergency room if over the weekend if her symptoms become worse.  Can follow-up early next week if symptoms have not resolved. - HYDROcodone-acetaminophen (NORCO) 7.5-325 MG tablet; Take 1 tablet by mouth every 6 (six) hours as needed for moderate pain.  Dispense: 10 tablet; Refill: 0  2. Pulmonary emphysema, unspecified emphysema type (HCC)  - diazepam (VALIUM) 5 MG tablet; TAKE 1/2 TO 1 TABLET BY MOUTH TWICE A DAY AS NEEDED  Dispense: 10 tablet; Refill: 0     I discussed the assessment and treatment plan with the patient. The patient was provided an opportunity to ask questions and all were answered. The patient agreed with the plan and demonstrated an understanding of the instructions.   The patient was advised to call back or seek an in-person evaluation if the symptoms worsen or if the condition fails to improve as anticipated.   [name], NP

## 2019-06-08 ENCOUNTER — Other Ambulatory Visit: Payer: Self-pay | Admitting: Adult Health

## 2019-06-08 DIAGNOSIS — M5116 Intervertebral disc disorders with radiculopathy, lumbar region: Secondary | ICD-10-CM

## 2019-06-08 DIAGNOSIS — J439 Emphysema, unspecified: Secondary | ICD-10-CM

## 2019-06-08 NOTE — Telephone Encounter (Signed)
Valium was to cover her until it came from mail order - did this not come?  Norco was short course - was advised if pain continued then to follow up

## 2019-06-08 NOTE — Telephone Encounter (Signed)
Medication Refill - Medication: HYDROcodone-acetaminophen (NORCO) 7.5-325 MG tablet  diazepam (VALIUM) 5 MG tablet - Pt states that initial refill was sent to [facility] however [facility] does not refill for less than 90 day prescriptions. Please advise, pt is out as well  Has the patient contacted their pharmacy? Yes.   (Agent: If no, request that the patient contact the pharmacy for the refill.) (Agent: If yes, when and what did the pharmacy advise?)  Preferred Pharmacy (with phone number or street name):  [facility], [place]  [place] [address]  Phone: [phone] Fax: [phone]     Agent: Please be advised that RX refills may take up to 3 business days. We ask that you follow-up with your pharmacy.

## 2019-06-08 NOTE — Telephone Encounter (Signed)
Requested medication (s) are due for refill today: yes  Requested medication (s) are on the active medication list: yes  Last refill:  05/21/2019  Future visit scheduled: no  Notes to clinic:  Patient states that Valium was sent over to [facility] but they don't fill less than 90 day supply. Patient is out of medication. Please advise   Requested Prescriptions  Pending Prescriptions Disp Refills   diazepam (VALIUM) 5 MG tablet 10 tablet 0    Sig: TAKE 1/2 TO 1 TABLET BY MOUTH TWICE A DAY AS NEEDED     Not Delegated - Psychiatry:  Anxiolytics/Hypnotics Failed - 06/08/2019  1:07 PM      Failed - This refill cannot be delegated      Failed - Urine Drug Screen completed in last 360 days.      Passed - Valid encounter within last 6 months    Recent Outpatient Visits          2 weeks ago Lumbar disc disease with radiculopathy   Therapist, music at [facility], [place], NP   2 months ago LLQ pain   Therapist, music at [facility], [place], NP   3 months ago Acute pain of left knee   Therapist, music at [facility], Alinda Sierras, MD   7 months ago Lumbar disc disease with radiculopathy   Therapist, music at [place], NP   8 months ago Routine general medical examination at a health care facility   [employer], [place], NP              HYDROcodone-acetaminophen ([place]) 7.5-325 MG tablet 10 tablet 0    Sig: Take 1 tablet by mouth every 6 (six) hours as needed for moderate pain.     Not Delegated - Analgesics:  Opioid Agonist Combinations Failed - 06/08/2019  1:07 PM      Failed - This refill cannot be delegated      Failed - Urine Drug Screen completed in last 360 days.      Passed - Valid encounter within last 6 months    Recent Outpatient Visits          2 weeks ago Lumbar disc disease with radiculopathy   Therapist, music at [facility], [place], NP   2 months ago LLQ pain   Therapist, music at  [facility], [place], NP   3 months ago Acute pain of left knee   Therapist, music at [facility], Alinda Sierras, MD   7 months ago Lumbar disc disease with radiculopathy   Therapist, music at [facility], [place], NP   8 months ago Routine general medical examination at a health care facility   [employer] at [facility], [place], NP

## 2019-06-09 ENCOUNTER — Other Ambulatory Visit: Payer: Self-pay | Admitting: Family Medicine

## 2019-06-09 ENCOUNTER — Other Ambulatory Visit: Payer: Self-pay | Admitting: Adult Health

## 2019-06-09 DIAGNOSIS — J439 Emphysema, unspecified: Secondary | ICD-10-CM

## 2019-06-09 DIAGNOSIS — G8929 Other chronic pain: Secondary | ICD-10-CM

## 2019-06-09 DIAGNOSIS — M545 Low back pain, unspecified: Secondary | ICD-10-CM

## 2019-06-09 DIAGNOSIS — M5116 Intervertebral disc disorders with radiculopathy, lumbar region: Secondary | ICD-10-CM

## 2019-06-09 MED ORDER — DIAZEPAM 5 MG PO TABS: ORAL_TABLET | ORAL | 2 refills | Status: DC

## 2019-06-09 NOTE — Telephone Encounter (Signed)
Valium sent to local pharmacy.   The pain medication was for a short course. I do not manage chronic pain with narcotic medication. I would be happy to refer her to pain management

## 2019-06-09 NOTE — Telephone Encounter (Signed)
Received refill request from [facility].  They were asking for a 90 day supply.  Called the pt.  She said [employer] will not fill a 30 day supply and is asking that both medications be sent to the local pharmacy.  Please send if appropriate.

## 2019-06-14 ENCOUNTER — Other Ambulatory Visit: Payer: Self-pay

## 2019-06-14 ENCOUNTER — Ambulatory Visit (INDEPENDENT_AMBULATORY_CARE_PROVIDER_SITE_OTHER): Payer: Medicare HMO

## 2019-06-14 DIAGNOSIS — Z23 Encounter for immunization: Secondary | ICD-10-CM | POA: Diagnosis not present

## 2019-06-25 ENCOUNTER — Ambulatory Visit (INDEPENDENT_AMBULATORY_CARE_PROVIDER_SITE_OTHER): Payer: Medicare HMO

## 2019-06-25 ENCOUNTER — Encounter (HOSPITAL_COMMUNITY): Payer: Self-pay | Admitting: *Deleted

## 2019-06-25 ENCOUNTER — Ambulatory Visit: Payer: Medicare HMO | Admitting: Pulmonary Disease

## 2019-06-25 ENCOUNTER — Encounter: Payer: Self-pay | Admitting: Pulmonary Disease

## 2019-06-25 ENCOUNTER — Other Ambulatory Visit: Payer: Self-pay

## 2019-06-25 VITALS — BP 116/74 | HR 89 | Temp 97.2°F | Ht 64.5 in | Wt 168.0 lb

## 2019-06-25 DIAGNOSIS — J432 Centrilobular emphysema: Secondary | ICD-10-CM | POA: Diagnosis not present

## 2019-06-25 DIAGNOSIS — J9811 Atelectasis: Secondary | ICD-10-CM | POA: Diagnosis not present

## 2019-06-25 DIAGNOSIS — R0600 Dyspnea, unspecified: Secondary | ICD-10-CM

## 2019-06-25 DIAGNOSIS — R0609 Other forms of dyspnea: Secondary | ICD-10-CM

## 2019-06-25 DIAGNOSIS — J449 Chronic obstructive pulmonary disease, unspecified: Secondary | ICD-10-CM | POA: Diagnosis not present

## 2019-06-25 MED ORDER — INCRUSE ELLIPTA 62.5 MCG/INH IN AEPB: 1.0000 | INHALATION_SPRAY | Freq: Every day | RESPIRATORY_TRACT | 5 refills | Status: DC

## 2019-06-25 NOTE — Progress Notes (Signed)
[facility]  Chief Complaint  Patient presents with  . Centrilobular Emphysema    Feels breathing is worse than last year. Not able to take Trelegy, cannot afford due to cost. Gets tired and winded more often.    Constitutional:  BP 116/74 (BP Location: Right Arm, Patient Position: Sitting, Cuff Size: Normal)   Pulse 89   Temp (!) 97.2 F (36.2 C)   Ht 5' 4.5" (1.638 m)   Wt 168 lb (76.2 kg)   SpO2 97% Comment: on room air  BMI 28.39 kg/m   Past Medical History:  Vertigo, Hypothyroidism, HLD, GERD, OA, AAA  Brief Summary:  Stacy Garcia is a 68 y.o. female former smoker with COPD and emphysema.  She wasn't able to afford trelegy.  Uses albuterol intermittently.  Helps some.  Has intermittent cough with clear sputum.  Denies chest pain.  Not having wheeze.  No leg swelling, fever, or hemoptysis.  Sinus okay and no sore throat.  Main issues is getting winded and tired with activity.  It is getting harder to keep up with household chores.  This makes her feel depressed.  She doesn't feel like she has any issues with her sleep.  Physical Exam:   Appearance - well kempt   ENMT - clear nasal mucosa, midline nasal  septum, no oral exudates, no LAN, trachea midline, wears dentures  Respiratory - normal chest wall, normal respiratory effort, no accessory muscle use, no wheeze/rales  CV - s1s2 regular rate and rhythm, no murmurs, no peripheral edema, radial pulses symmetric  GI - soft, non tender, no masses  Lymph - no adenopathy noted in neck and axillary areas  MSK - normal gait  Ext - no cyanosis, clubbing, or joint inflammation noted  Skin - no rashes, lesions, or ulcers  Neuro - normal strength, oriented x 3  Psych - normal mood and affect   Assessment/Plan:   COPD with emphysema. - no improvement with bevespi - couldn't afford trelegy - she has progression of her symptoms - will try adding incruse - chest xray today -  will schedule PFT - referral to pulmonary rehab   Patient Instructions  Incruse 1 puff daily Chest xray today Will schedule pulmonary function test and arrange for referral to pulmonary rehab  Follow up in 8 weeks    Stacy Mires, MD [place] Pager: [phone] 06/25/2019, 10:44 AM  Flow Sheet     Pulmonary tests:  PFT 10/23/15 >> FEV1 1.25 (50%), FEV1% 69, TLC 4.54 (87%), DLCO 52%, no BD  Medications:   Allergies as of 06/25/2019      Reactions   Prednisone    "Swelling of face"      Medication List       Accurate as of June 25, 2019 10:44 AM. If you have any questions, ask your nurse or doctor.        STOP taking these medications   dicyclomine 20 MG tablet Commonly known as: BENTYL Stopped by: Stacy Mires, MD   Fluticasone-Umeclidin-Vilant 100-62.5-25 MCG/INH Aepb Commonly known as: Trelegy Ellipta Stopped by: Stacy Mires, MD   HYDROcodone-acetaminophen 7.5-325 MG tablet Commonly known as: Norco Stopped by: Stacy Mires, MD   omeprazole 40 MG capsule Commonly known as: PRILOSEC Stopped by: Stacy Mires, MD     TAKE these medications   albuterol 108 (90 Base) MCG/ACT inhaler Commonly known as: Ventolin HFA Inhale 2 puffs into the lungs every 6 (six) hours as needed for wheezing  or shortness of breath.   aspirin 325 MG EC tablet Take 325 mg by mouth daily.   atorvastatin 40 MG tablet Commonly known as: LIPITOR Take 1 tablet (40 mg total) by mouth daily.   diazepam 5 MG tablet Commonly known as: VALIUM TAKE 1/2 TO 1 TABLET BY MOUTH TWICE A DAY AS NEEDED   Incruse Ellipta 62.5 MCG/INH Aepb Generic drug: umeclidinium bromide Inhale 1 puff into the lungs daily. Started by: Stacy Mires, MD   levothyroxine 100 MCG tablet Commonly known as: SYNTHROID Take 1 tablet (100 mcg total) by mouth daily.       Past Surgical History:  She  has a past surgical history that includes Cesarean section; Shoulder surgery  (02/2015); Total knee arthroplasty (Right, 01/23/2016); Joint replacement; Tubal ligation; and Abdominal aortic aneurysm repair (N/A, 02/05/2017).  Family History:  Her family history includes Diabetes in an other family member; Hyperlipidemia in an other family member; Hypertension in an other family member; Thyroid disease in an other family member.  Social History:  She  reports that she quit smoking about 2 years ago. Her smoking use included cigarettes. She has a 50.00 pack-year smoking history. She has never used smokeless tobacco. She reports that she does not drink alcohol or use drugs.

## 2019-06-25 NOTE — Patient Instructions (Signed)
Incruse 1 puff daily Chest xray today Will schedule pulmonary function test and arrange for referral to pulmonary rehab  Follow up in 8 weeks

## 2019-06-25 NOTE — Progress Notes (Signed)
Received referral from Dr. Halford Chessman for this pt to participate in Pulmonary rehab with the diagnosis of Centrilobular Emphysema. Clinical review of pt follow up appt on 10/15 with Dr. Halford Chessman Pulmonary office note. Pt with PFT completed in 2017 which showed FEV1 post predicted 50.  Pt is scheduled for repeat PFT that has not been scheduled as of yet.  Pt with history of low back pain and MRI from 2019  Did show neural impingment however pt is opposed to surgical intervention. Exercise may be limited as a result. Pt covid risk score - 3.  Pt appropriate for scheduling for Pulmonary rehab.  Will forward to support staff for scheduling and verification of insurance eligibility/benefits with pt  consent. Cherre Huger, BSN Cardiac and Training and development officer

## 2019-06-29 ENCOUNTER — Telehealth: Payer: Self-pay | Admitting: Pulmonary Disease

## 2019-06-29 NOTE — Telephone Encounter (Signed)
CLINICAL DATA:  COPD.  Progressive dyspnea.  Emphysema.  EXAM: CHEST - 2 VIEW  COMPARISON:  Feb 06, 2017.  FINDINGS: The heart size and mediastinal contours are within normal limits. Both lungs are clear. The visualized skeletal structures are unremarkable.  IMPRESSION: Minimal atelectasis in the left base.  No other abnormalities.   Electronically Signed   By: Dorise Bullion III M.D   On: 06/25/2019 13:59   Please let her know her chest xray was normal.

## 2019-06-29 NOTE — Telephone Encounter (Signed)
Called the patient and advised of results. Patient voiced understanding. Nothing further needed at this time.

## 2019-06-30 ENCOUNTER — Telehealth (HOSPITAL_COMMUNITY): Payer: Self-pay

## 2019-07-01 DIAGNOSIS — Z1231 Encounter for screening mammogram for malignant neoplasm of breast: Secondary | ICD-10-CM | POA: Diagnosis not present

## 2019-07-01 LAB — HM MAMMOGRAPHY

## 2019-07-08 ENCOUNTER — Encounter: Payer: Medicare HMO | Admitting: Physical Medicine & Rehabilitation

## 2019-07-23 ENCOUNTER — Other Ambulatory Visit: Payer: Self-pay | Admitting: Adult Health

## 2019-07-27 NOTE — Telephone Encounter (Signed)
Sent to the pharmacy by e-scribe.  Pt scheduled for upcoming cpx.

## 2019-07-28 ENCOUNTER — Other Ambulatory Visit: Payer: Self-pay | Admitting: Adult Health

## 2019-07-29 ENCOUNTER — Other Ambulatory Visit: Payer: Self-pay

## 2019-07-29 ENCOUNTER — Encounter: Payer: Medicare HMO | Attending: Physical Medicine & Rehabilitation | Admitting: Physical Medicine & Rehabilitation

## 2019-07-29 VITALS — BP 127/85 | HR 105 | Temp 98.0°F | Ht 65.0 in | Wt 169.0 lb

## 2019-07-29 DIAGNOSIS — M419 Scoliosis, unspecified: Secondary | ICD-10-CM

## 2019-07-29 DIAGNOSIS — M47816 Spondylosis without myelopathy or radiculopathy, lumbar region: Secondary | ICD-10-CM | POA: Diagnosis not present

## 2019-07-29 MED ORDER — GABAPENTIN 100 MG PO CAPS: 100.0000 mg | ORAL_CAPSULE | Freq: Three times a day (TID) | ORAL | 1 refills | Status: DC

## 2019-07-29 NOTE — Progress Notes (Signed)
Subjective:    Patient ID: Stacy Garcia, female    DOB: [birth year], 68 y.o.   MRN: [id number]  HPI 68 year old female with history of chronic low back pain.  She states her pain averages 6-7/10.  Interferes with general activity relationship with other enjoyment of life.  Daytime and evening hours are the worst she does better at night.  Pain is worse with bending sitting and standing as well as going up and down steps.  Improves with rest and medication.  Relief from medications is good.  She is able to do her housework she is independent with all her self-care and mobility.  Her walking tolerance is reported as 15 min.  She does indicate that her pain is intermittent in the left lower limb and fairly constant in the back area.  Her husband needs to do the vacuuming but she does most of the other household duties. She has had a history of right total knee replacement in 2017 as well as aortic aneurysm repair in 2018. Her past medical history is remarkable for COPD and tobacco abuse  She had physical therapy primarily for her knee rehab but had a couple visits in March 2020 for her low back Pt tried physical therapy before COVID hit, had a high copay Pt indicates it was not helpful  Pt independent Pt states she has vertigo which limits her ability to perform mat exercises Turning to her left causes dizziness  Pain Inventory Average Pain 7 Pain Right Now 6 My pain is constant, sharp, burning, dull, stabbing, tingling and aching  In the last 24 hours, has pain interfered with the following? General activity 7 Relation with others 7 Enjoyment of life 7 What TIME of day is your pain at its worst? daytime, evening Sleep (in general) Fair  Pain is worse with: bending, sitting and standing Pain improves with: rest and medication Relief from Meds: 7  Mobility walk without assistance walk with assistance ability to climb steps?  yes do you drive?  yes Do you have any goals in this  area?  yes  Function Do you have any goals in this area?  yes  Neuro/Psych numbness tremor tingling dizziness  Prior Studies new EXAM: MRI LUMBAR SPINE WITHOUT CONTRAST  TECHNIQUE: Multiplanar, multisequence MR imaging of the lumbar spine was performed. No intravenous contrast was administered.  COMPARISON:  Plain film 03/17/2017.  FINDINGS: Segmentation:  Standard.  Alignment: Degenerative scoliosis convex LEFT mid lumbar region. Rightward translation L3 on L4.  Vertebrae:  No worrisome osseous lesion.  No fracture.  Conus medullaris and cauda equina: Conus extends to the L1 level. Conus and cauda equina appear normal.  Paraspinal and other soft tissues: Unremarkable.  Disc levels:  L1-L2: Central and leftward protrusion. Facet arthropathy. Borderline LEFT L2 neural impingement.  L2-L3: Central and leftward protrusion. Posterior element hypertrophy. LEFT L3 neural impingement.  L3-L4: Rightward translation L3 on L4. Disc space narrowing. Central protrusion which extends to the RIGHT neural foramen. Posterior element hypertrophy. Mild stenosis. RIGHT greater than LEFT L4 and L3 neural impingement.  L4-L5: Central protrusion. There is additional far-lateral and foraminal protrusion to the RIGHT. Posterior element hypertrophy. Mild stenosis. RIGHT L4, possible RIGHT L5 neural impingement.  L5-S1: Central protrusion. Posterior element hypertrophy. No impingement.  IMPRESSION: Potentially symptomatic RIGHT-sided neural impingement at L3-4 and L4-5. See comments above. Correlate clinically for RIGHT L3, L4, and or L5 neural impingement.  Central and leftward protrusions at L1-2 and L2-3 could contribute to LEFT-sided symptomatology if  present.   Electronically Signed   By: Staci Righter M.D.   On: 02/26/2018 14:31 Physicians involved in your care new   Family History  Problem Relation Age of Onset  . Thyroid disease Other   .  Diabetes Other   . Hyperlipidemia Other   . Hypertension Other    Social History   Socioeconomic History  . Marital status: Married    Spouse name: Not on file  . Number of children: 3  . Years of education: Not on file  . Highest education level: Not on file  Occupational History  . Occupation: Designer/manufacturing    Comment: Librarian, academic; retired 2017  Social Needs  . Financial resource strain: Somewhat hard  . Food insecurity    Worry: Never true    Inability: Never true  . Transportation needs    Medical: No    Non-medical: No  Tobacco Use  . Smoking status: Former Smoker    Packs/day: 1.00    Years: 50.00    Pack years: 50.00    Types: Cigarettes    Quit date: 12/31/2016    Years since quitting: 2.5  . Smokeless tobacco: Never Used  Substance and Sexual Activity  . Alcohol use: No    Alcohol/week: 0.0 standard drinks  . Drug use: No  . Sexual activity: Not Currently  Lifestyle  . Physical activity    Days per week: 0 days    Minutes per session: 0 min  . Stress: Only a little  Relationships  . Social connections    Talks on phone: More than three times a week    Gets together: More than three times a week    Attends religious service: Not on file    Active member of club or organization: No    Attends meetings of clubs or organizations: Never    Relationship status: Married  Other Topics Concern  . Not on file  Social History Narrative   11/03/2018:   Lives with husband in 2 story home   Has 3 children, 7 grandchildren, 37 great-grandchildren, all of whom live close by   Takes care of two of her great grandchildren (59 yo and 1yo)   Horticulturist, commercial working, 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   Past Surgical History:  Procedure Laterality Date  . ABDOMINAL AORTIC ANEURYSM REPAIR N/A 02/05/2017   Procedure: ANEURYSM ABDOMINAL AORTIC REPAIR;  Surgeon: Elam Dutch, MD;  Location: [facility] OR;  Service: Vascular;   Laterality: N/A;  . CESAREAN SECTION     x 3  . JOINT REPLACEMENT    . SHOULDER SURGERY  02/2015  . TOTAL KNEE ARTHROPLASTY Right 01/23/2016   Procedure: TOTAL KNEE ARTHROPLASTY;  Surgeon: Garald Balding, MD;  Location: [place];  Service: Orthopedics;  Laterality: Right;  . TUBAL LIGATION     Past Medical History:  Diagnosis Date  . AAA (abdominal aortic aneurysm) ([place])   . Arthritis   . COPD (chronic obstructive pulmonary disease) ([place])   . Dyspnea    just recently started wheezing  . GERD (gastroesophageal reflux disease)   . Hyperlipidemia   . Hypothyroidism   . Thyroid disease   . Tobacco abuse   . Vertigo    BP 127/85   Pulse (!) 105   Temp 98 F (36.7 C)   Ht 5\' 5"  (1.651 m)   Wt 169 lb (76.7 kg)   SpO2 94%   BMI 28.12 kg/m   Opioid Risk Score:  Fall Risk Score:  `1  Depression screen PHQ 2/9  Depression screen [facility] 2/9 07/29/2019 11/03/2018 09/15/2018 10/16/2015 04/28/2014  Decreased Interest 1 0 0 0 0  Down, Depressed, Hopeless 1 0 0 0 -  PHQ - 2 Score 2 0 0 0 0  Altered sleeping 2 0 - - -  Tired, decreased energy 3 0 - - -  Change in appetite 0 0 - - -  Feeling bad or failure about yourself  0 0 - - -  Trouble concentrating 1 0 - - -  Moving slowly or fidgety/restless 0 0 - - -  Suicidal thoughts 0 0 - - -  PHQ-9 Score 8 0 - - -  Difficult doing work/chores Somewhat difficult - - - -     Review of Systems  Constitutional: Negative.   HENT: Negative.   Eyes: Negative.   Respiratory: Negative.   Gastrointestinal: Negative.   Endocrine: Negative.   Genitourinary: Negative.   Musculoskeletal: Positive for back pain.  Skin: Negative.   Allergic/Immunologic: Negative.   Neurological: Positive for dizziness, tremors and numbness.       Tingling  Psychiatric/Behavioral: Negative.   All other systems reviewed and are negative.      Objective:   Physical Exam Vitals signs and nursing note reviewed.  Constitutional:      Appearance: Normal appearance.  She is normal weight.  HENT:     Head: Normocephalic and atraumatic.  Eyes:     Extraocular Movements: Extraocular movements intact.     Conjunctiva/sclera: Conjunctivae normal.     Pupils: Pupils are equal, round, and reactive to light.  Neck:     Musculoskeletal: Normal range of motion.  Cardiovascular:     Rate and Rhythm: Normal rate and regular rhythm.     Heart sounds: Normal heart sounds. No murmur.  Pulmonary:     Effort: Pulmonary effort is normal.     Breath sounds: Normal breath sounds.  Abdominal:     General: Abdomen is flat. Bowel sounds are normal. There is no distension.     Palpations: Abdomen is soft.  Skin:    General: Skin is warm and dry.  Neurological:     General: No focal deficit present.     Mental Status: She is alert and oriented to person, place, and time.    Levoconvex scoliosis centered at around L3  Lumbar flexion approximately 75% extension approximately 25% lateral bending approximately 25% bilaterally. Twisting is approximately 25 to 50%.  Negative straight leg raising test  Sensation intact to pinprick bilateral upper and lower limbs  Deep tendon reflexes are absent of the right knee 1+ left knee 1+ bilateral ankles  Gait: Ambulates without assistive device no evidence of toe drag or knee instability.        Assessment & Plan:  #1.  Lumbar scoliosis her chronic low back pain is most likely related to her lumbar facet hypertrophy.  She has tried physical therapy without much improvement she has tried some oral medications.  She has intolerance to tramadol causing dizziness.  We discussed that she is also on chronic diazepam.  Her opioid risk/benefit ratio is not positive. We discussed medial branch blocks which can be done without corticosteroids given her problem with prednisone.  She does not wish to consider any interventional procedures  2.  Lumbar radiculitis the left thigh pain is most likely related to her L2/L3 stenosis.   Epidural injections at this level may be helpful.  Once again she would  not like to pursue this. She may get some benefit from gabapentin we started her on 100 mg twice daily.  She will follow-up with her primary care provider.  I discussed that her gabapentin dose may need to be adjusted upwards.  Patient will follow-up with physical medicine rehab clinic if she wishes to pursue the spinal injections.

## 2019-07-29 NOTE — Patient Instructions (Addendum)
The gabapentin dose IS FOR SHOOTING PAINS may need to be increased , Tommi Rumps can do this for you if needed GIven the intolerance to tramadol, problem with dizziness and the chronic diazepam, I would not recommend opiates  Please call if you'd like to try injections for back pain (MEDIAL BRANCH BLOCKS) or Shooting pains in thighs (EPIDURAL INJECTION)

## 2019-07-29 NOTE — Telephone Encounter (Signed)
DENIED.  FILLED FOR 1 YEAR ON 10/22/2018.

## 2019-07-30 ENCOUNTER — Encounter: Payer: Self-pay | Admitting: Physical Medicine & Rehabilitation

## 2019-08-25 ENCOUNTER — Ambulatory Visit: Payer: Medicare HMO | Admitting: Pulmonary Disease

## 2019-09-20 ENCOUNTER — Ambulatory Visit: Payer: Medicare HMO | Admitting: Pulmonary Disease

## 2019-09-20 ENCOUNTER — Other Ambulatory Visit: Payer: Self-pay

## 2019-09-20 ENCOUNTER — Encounter: Payer: Self-pay | Admitting: Pulmonary Disease

## 2019-09-20 VITALS — BP 110/64 | HR 71 | Temp 97.2°F | Ht 65.0 in | Wt 170.2 lb

## 2019-09-20 DIAGNOSIS — J432 Centrilobular emphysema: Secondary | ICD-10-CM | POA: Diagnosis not present

## 2019-09-20 NOTE — Patient Instructions (Signed)
Call when you are finishing your script for incruse and we will then send a script to switch to spiriva  Follow up in 6 months

## 2019-09-20 NOTE — Progress Notes (Signed)
[facility]  Chief Complaint  Patient presents with  . Follow-up    slight cough, wheezing, SOB    Constitutional:  BP 110/64 (BP Location: Right Arm, Cuff Size: Normal)   Pulse 71   Temp (!) 97.2 F (36.2 C) (Temporal)   Ht 5\' 5"  (1.651 m)   Wt 170 lb 3.2 oz (77.2 kg)   SpO2 96% Comment: RA  BMI 28.32 kg/m   Past Medical History:  Vertigo, Hypothyroidism, HLD, GERD, OA, AAA  Brief Summary:  Stacy Garcia is a 69 y.o. female former smoker with COPD and emphysema.  She tried using incruse.  Didn't like the taste from dry powder.    She gets intermittent cough and wheeze.  Not having sputum, fever, or hemoptysis.  Gets winded if she walks too fast.  Using albuterol intermittently and this helps.  Sleeping okay.  Didn't ever get PFT scheduled.   Respiratory Exam:   ENMT - no sinus tenderness, no nasal discharge, no oral exudate  Respiratory - normal appearance of chest wall, normal respiratory effort w/o accessory muscle use, no dullness on percussion, no wheezing or rales  CV - s1s2 regular rate and rhythm, no murmurs, no peripheral edema, radial pulses symmetric  Ext - no cyanosis, clubbing, or joint inflammation noted   Assessment/Plan:   COPD with emphysema. - not at goal of therapy - no improvement with bevespi, trelegy was too expense, doesn't like DPI with incruse - she will finish current script of incruse, and then call to get script for spiriva - if her symptoms progress, then reschedule PFT - prn albuterol    Patient Instructions  Call when you are finishing your script for incruse and we will then send a script to switch to spiriva  Follow up in 6 months    Chesley Mires, MD [place] Pager: [phone] 09/20/2019, 12:43 PM  Flow Sheet     Pulmonary tests:  PFT 10/23/15 >> FEV1 1.25 (50%), FEV1% 69, TLC 4.54 (87%), DLCO 52%, no BD  Medications:   Allergies as of 09/20/2019     Reactions   Prednisone    "Swelling of face"      Medication List       Accurate as of September 20, 2019 12:43 PM. If you have any questions, ask your nurse or doctor.        albuterol 108 (90 Base) MCG/ACT inhaler Commonly known as: Ventolin HFA Inhale 2 puffs into the lungs every 6 (six) hours as needed for wheezing or shortness of breath.   aspirin 325 MG EC tablet Take 325 mg by mouth daily.   atorvastatin 40 MG tablet Commonly known as: LIPITOR TAKE 1 TABLET (40 MG TOTAL) BY MOUTH DAILY.   diazepam 5 MG tablet Commonly known as: VALIUM TAKE 1/2 TO 1 TABLET BY MOUTH TWICE A DAY AS NEEDED   gabapentin 100 MG capsule Commonly known as: NEURONTIN Take 1 capsule (100 mg total) by mouth 3 (three) times daily.   Incruse Ellipta 62.5 MCG/INH Aepb Generic drug: umeclidinium bromide Inhale 1 puff into the lungs daily.   levothyroxine 100 MCG tablet Commonly known as: SYNTHROID Take 1 tablet (100 mcg total) by mouth daily.       Past Surgical History:  She  has a past surgical history that includes Cesarean section; Shoulder surgery (02/2015); Total knee arthroplasty (Right, 01/23/2016); Joint replacement; Tubal ligation; and Abdominal aortic aneurysm repair (N/A, 02/05/2017).  Family History:  Her family  history includes Diabetes in an other family member; Hyperlipidemia in an other family member; Hypertension in an other family member; Thyroid disease in an other family member.  Social History:  She  reports that she quit smoking about 2 years ago. Her smoking use included cigarettes. She has a 50.00 pack-year smoking history. She has never used smokeless tobacco. She reports that she does not drink alcohol or use drugs.

## 2019-09-23 ENCOUNTER — Other Ambulatory Visit: Payer: Self-pay

## 2019-09-23 ENCOUNTER — Encounter: Payer: Self-pay | Admitting: Adult Health

## 2019-09-23 ENCOUNTER — Telehealth (INDEPENDENT_AMBULATORY_CARE_PROVIDER_SITE_OTHER): Payer: Medicare HMO | Admitting: Adult Health

## 2019-09-23 ENCOUNTER — Ambulatory Visit: Payer: Medicare HMO | Attending: Internal Medicine

## 2019-09-23 VITALS — Temp 97.5°F | Ht 65.0 in | Wt 170.0 lb

## 2019-09-23 DIAGNOSIS — Z20822 Contact with and (suspected) exposure to covid-19: Secondary | ICD-10-CM

## 2019-09-23 NOTE — Progress Notes (Signed)
Virtual Visit via Video Note  I connected with Stacy Garcia  on 09/23/19 at  2:00 PM EST by a video enabled telemedicine application and verified that I am speaking with the correct person using two identifiers.  Location patient: home Location provider:work or home office Persons participating in the virtual visit: patient, provider  I discussed the limitations of evaluation and management by telemedicine and the availability of in person appointments. The patient expressed understanding and agreed to proceed.   HPI: 69 year old female who is being evaluated today for an acute issue.  Her symptoms started 2 days ago.  Her symptoms include constant headache, constant dry cough, and clear rhinorrhea.  She does have fatigue but this is a chronic issue and she does not feel as though it is any worse.  She denies fevers, chills, loss of taste or smell, sinus pain or pressure, or wheezing.  He has shortness of breath at baseline due to COPD but does not feel as though it has become worse and she has not experienced any wheezing.  She has not been around anybody that is been sick  ROS: See pertinent positives and negatives per HPI.  Past Medical History:  Diagnosis Date  . AAA (abdominal aortic aneurysm) ([place])   . Arthritis   . COPD (chronic obstructive pulmonary disease) ([place])   . Dyspnea    just recently started wheezing  . GERD (gastroesophageal reflux disease)   . Hyperlipidemia   . Hypothyroidism   . Thyroid disease   . Tobacco abuse   . Vertigo     Past Surgical History:  Procedure Laterality Date  . ABDOMINAL AORTIC ANEURYSM REPAIR N/A 02/05/2017   Procedure: ANEURYSM ABDOMINAL AORTIC REPAIR;  Surgeon: Elam Dutch, MD;  Location: [facility] OR;  Service: Vascular;  Laterality: N/A;  . CESAREAN SECTION     x 3  . JOINT REPLACEMENT    . SHOULDER SURGERY  02/2015  . TOTAL KNEE ARTHROPLASTY Right 01/23/2016   Procedure: TOTAL KNEE ARTHROPLASTY;  Surgeon: Garald Balding, MD;   Location: [place];  Service: Orthopedics;  Laterality: Right;  . TUBAL LIGATION      Family History  Problem Relation Age of Onset  . Thyroid disease Other   . Diabetes Other   . Hyperlipidemia Other   . Hypertension Other       Current Outpatient Medications:  .  albuterol (VENTOLIN HFA) 108 (90 Base) MCG/ACT inhaler, Inhale 2 puffs into the lungs every 6 (six) hours as needed for wheezing or shortness of breath., Disp: 1 Inhaler, Rfl: 5 .  aspirin 325 MG EC tablet, Take 325 mg by mouth daily., Disp: , Rfl:  .  atorvastatin (LIPITOR) 40 MG tablet, TAKE 1 TABLET (40 MG TOTAL) BY MOUTH DAILY., Disp: 90 tablet, Rfl: 0 .  diazepam (VALIUM) 5 MG tablet, TAKE 1/2 TO 1 TABLET BY MOUTH TWICE A DAY AS NEEDED, Disp: 30 tablet, Rfl: 2 .  levothyroxine (SYNTHROID, LEVOTHROID) 100 MCG tablet, Take 1 tablet (100 mcg total) by mouth daily., Disp: 90 tablet, Rfl: 3 .  umeclidinium bromide (INCRUSE ELLIPTA) 62.5 MCG/INH AEPB, Inhale 1 puff into the lungs daily., Disp: 1 each, Rfl: 5 .  gabapentin (NEURONTIN) 100 MG capsule, Take 1 capsule (100 mg total) by mouth 3 (three) times daily. (Patient not taking: Reported on 09/23/2019), Disp: 90 capsule, Rfl: 1  Current Facility-Administered Medications:  .  0.9 %  sodium chloride infusion, 500 mL, Intravenous, Once, Armbruster, Carlota Raspberry, MD  Jasmine DecemberTonette Bihari  per patient if applicable:  GENERAL: alert, oriented, appears well and in no acute distress  HEENT: atraumatic, conjunttiva clear, no obvious abnormalities on inspection of external nose and ears  NECK: normal movements of the head and neck  LUNGS: on inspection no signs of respiratory distress, breathing rate appears normal, no obvious gross SOB, gasping or wheezing  CV: no obvious cyanosis  MS: moves all visible extremities without noticeable abnormality  PSYCH/NEURO: pleasant and cooperative, no obvious depression or anxiety, speech and thought processing grossly intact  ASSESSMENT AND  PLAN:  Discussed the following assessment and plan: 1. Suspected COVID-19 virus infection -Advised to get Covid testing done.  Phone number given to make an appointment.  She was advised to stay hydrated and rest.  Follow-up if no improvement in the next 3 to 4 days or if symptoms worsen    I discussed the assessment and treatment plan with the patient. The patient was provided an opportunity to ask questions and all were answered. The patient agreed with the plan and demonstrated an understanding of the instructions.   The patient was advised to call back or seek an in-person evaluation if the symptoms worsen or if the condition fails to improve as anticipated.   [name], NP

## 2019-09-25 LAB — NOVEL CORONAVIRUS, NAA: SARS-CoV-2, NAA: NOT DETECTED

## 2019-09-27 ENCOUNTER — Other Ambulatory Visit: Payer: Self-pay | Admitting: Adult Health

## 2019-10-01 ENCOUNTER — Other Ambulatory Visit: Payer: Self-pay | Admitting: Adult Health

## 2019-10-01 DIAGNOSIS — J439 Emphysema, unspecified: Secondary | ICD-10-CM

## 2019-10-05 NOTE — Telephone Encounter (Signed)
Ok for refill? 

## 2019-10-07 ENCOUNTER — Encounter: Payer: Self-pay | Admitting: Adult Health

## 2019-10-19 ENCOUNTER — Other Ambulatory Visit: Payer: Self-pay

## 2019-10-20 ENCOUNTER — Ambulatory Visit (INDEPENDENT_AMBULATORY_CARE_PROVIDER_SITE_OTHER): Payer: Medicare HMO | Admitting: Adult Health

## 2019-10-20 ENCOUNTER — Other Ambulatory Visit: Payer: Self-pay

## 2019-10-20 ENCOUNTER — Encounter: Payer: Self-pay | Admitting: Adult Health

## 2019-10-20 VITALS — BP 128/60 | HR 71 | Temp 97.2°F | Ht 64.5 in | Wt 169.0 lb

## 2019-10-20 DIAGNOSIS — Z0000 Encounter for general adult medical examination without abnormal findings: Secondary | ICD-10-CM

## 2019-10-20 DIAGNOSIS — K219 Gastro-esophageal reflux disease without esophagitis: Secondary | ICD-10-CM

## 2019-10-20 DIAGNOSIS — G8929 Other chronic pain: Secondary | ICD-10-CM | POA: Diagnosis not present

## 2019-10-20 DIAGNOSIS — M5442 Lumbago with sciatica, left side: Secondary | ICD-10-CM

## 2019-10-20 DIAGNOSIS — J439 Emphysema, unspecified: Secondary | ICD-10-CM

## 2019-10-20 DIAGNOSIS — E018 Other iodine-deficiency related thyroid disorders and allied conditions: Secondary | ICD-10-CM

## 2019-10-20 DIAGNOSIS — R42 Dizziness and giddiness: Secondary | ICD-10-CM

## 2019-10-20 DIAGNOSIS — E782 Mixed hyperlipidemia: Secondary | ICD-10-CM | POA: Diagnosis not present

## 2019-10-20 LAB — TSH: TSH: 0.33 u[IU]/mL — ABNORMAL LOW (ref 0.35–4.50)

## 2019-10-20 LAB — LIPID PANEL
Cholesterol: 180 mg/dL (ref 0–200)
HDL: 48.5 mg/dL (ref >39.00)
LDL Cholesterol: 100 mg/dL — ABNORMAL HIGH (ref 0–99)
NonHDL: 131.64
Total CHOL/HDL Ratio: 4
Triglycerides: 159 mg/dL — ABNORMAL HIGH (ref 0.0–149.0)
VLDL: 31.8 mg/dL (ref 0.0–40.0)

## 2019-10-20 LAB — CBC WITH DIFFERENTIAL/PLATELET
Basophils Absolute: 0.1 10*3/uL (ref 0.0–0.1)
Basophils Relative: 1.5 % (ref 0.0–3.0)
Eosinophils Absolute: 0.1 10*3/uL (ref 0.0–0.7)
Eosinophils Relative: 1.6 % (ref 0.0–5.0)
HCT: 48.7 % — ABNORMAL HIGH (ref 36.0–46.0)
Hemoglobin: 16 g/dL — ABNORMAL HIGH (ref 12.0–15.0)
Lymphocytes Relative: 40.6 % (ref 12.0–46.0)
Lymphs Abs: 2.8 10*3/uL (ref 0.7–4.0)
MCHC: 32.8 g/dL (ref 30.0–36.0)
MCV: 92.8 fl (ref 78.0–100.0)
Monocytes Absolute: 0.4 10*3/uL (ref 0.1–1.0)
Monocytes Relative: 5.9 % (ref 3.0–12.0)
Neutro Abs: 3.4 10*3/uL (ref 1.4–7.7)
Neutrophils Relative %: 50.4 % (ref 43.0–77.0)
Platelets: 223 10*3/uL (ref 150.0–400.0)
RBC: 5.25 Mil/uL — ABNORMAL HIGH (ref 3.87–5.11)
RDW: 14.1 % (ref 11.5–15.5)
WBC: 6.8 10*3/uL (ref 4.0–10.5)

## 2019-10-20 LAB — COMPREHENSIVE METABOLIC PANEL
ALT: 21 U/L (ref 0–35)
AST: 18 U/L (ref 0–37)
Albumin: 4.3 g/dL (ref 3.5–5.2)
Alkaline Phosphatase: 99 U/L (ref 39–117)
BUN: 13 mg/dL (ref 6–23)
CO2: 30 mEq/L (ref 19–32)
Calcium: 9.5 mg/dL (ref 8.4–10.5)
Chloride: 104 mEq/L (ref 96–112)
Creatinine, Ser: 0.92 mg/dL (ref 0.40–1.20)
GFR: 60.17 mL/min (ref >60.00)
Glucose, Bld: 96 mg/dL (ref 70–99)
Potassium: 4.4 mEq/L (ref 3.5–5.1)
Sodium: 141 mEq/L (ref 135–145)
Total Bilirubin: 0.9 mg/dL (ref 0.2–1.2)
Total Protein: 6.8 g/dL (ref 6.0–8.3)

## 2019-10-20 MED ORDER — LEVOTHYROXINE SODIUM 88 MCG PO TABS: 88.0000 ug | ORAL_TABLET | Freq: Every day | ORAL | 3 refills | Status: DC

## 2019-10-20 NOTE — Progress Notes (Signed)
Subjective:    Patient ID: Stacy Garcia, female    DOB: 1951/03/10, 69 y.o.   MRN: [id number]  HPI Patient presents for yearly preventative medicine examination. Pleasant 69 year old female who  has a past medical history of AAA (abdominal aortic aneurysm) ([place]), Arthritis, COPD (chronic obstructive pulmonary disease) (HCC), Dyspnea, GERD (gastroesophageal reflux disease), Hyperlipidemia, Hypothyroidism, Thyroid disease, Tobacco abuse, and Vertigo.   Hypothyroidism-takes Synthroid 100 mcg.  She feels well controlled Lab Results  Component Value Date   TSH 0.54 10/21/2018   Hyperlipidemia-currently prescribed simvastatin 40 mg and aspirin 81 mg daily.  She denies myalgia or fatigue Lab Results  Component Value Date   CHOL 212 (H) 09/15/2018   HDL 46.20 09/15/2018   LDLCALC 121 (H) 06/19/2017   LDLDIRECT 132.0 09/15/2018   TRIG 243.0 (H) 09/15/2018   CHOLHDL 5 09/15/2018    GERD - Controlled with omeprazole   H/o Vertigo - takes valium as needed  COPD -is followed by pulmonary, her last visit was in January 2021.  At this time she was endorsing intermittent cough and wheezing.  Not having fevers sputum production.  Endorse getting winded if she walks too fast.  She was on Incruse which was switched to Spiriva for not being at goal. She reports that she has not ran out of Incruse yet so she has not switched inhalers. No change in breathing   History of AAA repair in April 2018.  Surgery was done by Dr. Oneida Alar.   Chronic low back pain -pain usually averages 6 out of 10.  Daytime and evening hours are worse.  She is able to get comfortable at night.  Or pain with bending, sitting, and standing as well as going up and down stairs.  She has tried physical therapy and did not find it helpful and also had a high co-pay.  Her low back pain is mostly related to lumbar facet hypertrophy.  She has been seen by Dr. Letta Pate back in November 2020 who discussed medial branch blocks without  prednisone but she did not wish to consider this interventional procedure.  Prescribed gabapentin and advised to follow-up with PCP.  She reports that she took this medication for a couple days and it produced a thick film on her tongue and she stopped the medication.  She does not wish to go back on this medication.  She is interested in seeing another pain management specialist  All immunizations and health maintenance protocols were reviewed with the patient and needed orders were placed. She is up to date on vaccinations   Appropriate screening laboratory values were ordered for the patient including screening of hyperlipidemia, renal function and hepatic function.  Medication reconciliation,  past medical history, social history, problem list and allergies were reviewed in detail with the patient  Goals were established with regard to weight loss, exercise, and  diet in compliance with medications  Wt Readings from Last 3 Encounters:  10/20/19 169 lb (76.7 kg)  09/23/19 170 lb (77.1 kg)  09/20/19 170 lb 3.2 oz (77.2 kg)   End of life planning was discussed.  She is up-to-date on routine screening colonoscopy and mammogram   Review of Systems  Constitutional: Negative.   HENT: Negative.   Eyes: Negative.   Respiratory: Positive for shortness of breath.   Cardiovascular: Negative.   Gastrointestinal: Negative.   Endocrine: Negative.   Genitourinary: Negative.   Musculoskeletal: Positive for arthralgias and back pain.  Skin: Negative.   Allergic/Immunologic: Negative.  Neurological: Negative.   Hematological: Negative.   Psychiatric/Behavioral: Negative.    Past Medical History:  Diagnosis Date  . AAA (abdominal aortic aneurysm) ([place])   . Arthritis   . COPD (chronic obstructive pulmonary disease) ([place])   . Dyspnea    just recently started wheezing  . GERD (gastroesophageal reflux disease)   . Hyperlipidemia   . Hypothyroidism   . Thyroid disease   . Tobacco abuse     . Vertigo     Social History   Socioeconomic History  . Marital status: Married    Spouse name: Not on file  . Number of children: 3  . Years of education: Not on file  . Highest education level: Not on file  Occupational History  . Occupation: Designer/manufacturing    Comment: Librarian, academic; retired 2017  Tobacco Use  . Smoking status: Former Smoker    Packs/day: 1.00    Years: 50.00    Pack years: 50.00    Types: Cigarettes    Quit date: 12/31/2016    Years since quitting: 2.8  . Smokeless tobacco: Never Used  Substance and Sexual Activity  . Alcohol use: No    Alcohol/week: 0.0 standard drinks  . Drug use: No  . Sexual activity: Not Currently  Other Topics Concern  . Not on file  Social History Narrative   11/03/2018:   Lives with husband in 2 story home   Has 3 children, 7 grandchildren, 5 great-grandchildren, all of whom live close by   Takes care of two of her great grandchildren (7 yo and 11yo)   Horticulturist, commercial working, 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   Social Determinants of Health   Financial Resource Strain: Medium Risk  . Difficulty of Paying Living Expenses: Somewhat hard  Food Insecurity: No Food Insecurity  . Worried About Charity fundraiser in the Last Year: Never true  . Ran Out of Food in the Last Year: Never true  Transportation Needs: No Transportation Needs  . Lack of Transportation (Medical): No  . Lack of Transportation (Non-Medical): No  Physical Activity: Inactive  . Days of Exercise per Week: 0 days  . Minutes of Exercise per Session: 0 min  Stress: No Stress Concern Present  . Feeling of Stress : Only a little  Social Connections: Unknown  . Frequency of Communication with Friends and Family: More than three times a week  . Frequency of Social Gatherings with Friends and Family: More than three times a week  . Attends Religious Services: Not on file  . Active Member of Clubs or  Organizations: No  . Attends Archivist Meetings: Never  . Marital Status: Married  Human resources officer Violence: Not At Risk  . Fear of Current or Ex-Partner: No  . Emotionally Abused: No  . Physically Abused: No  . Sexually Abused: No    Past Surgical History:  Procedure Laterality Date  . ABDOMINAL AORTIC ANEURYSM REPAIR N/A 02/05/2017   Procedure: ANEURYSM ABDOMINAL AORTIC REPAIR;  Surgeon: Elam Dutch, MD;  Location: [facility] OR;  Service: Vascular;  Laterality: N/A;  . CESAREAN SECTION     x 3  . JOINT REPLACEMENT    . SHOULDER SURGERY  02/2015  . TOTAL KNEE ARTHROPLASTY Right 01/23/2016   Procedure: TOTAL KNEE ARTHROPLASTY;  Surgeon: Garald Balding, MD;  Location: [facility];  Service: Orthopedics;  Laterality: Right;  . TUBAL LIGATION      Family History  Problem Relation Age of  Onset  . Thyroid disease Other   . Diabetes Other   . Hyperlipidemia Other   . Hypertension Other     Allergies  Allergen Reactions  . Prednisone     "Swelling of face"    Current Outpatient Medications on File Prior to Visit  Medication Sig Dispense Refill  . albuterol (VENTOLIN HFA) 108 (90 Base) MCG/ACT inhaler Inhale 2 puffs into the lungs every 6 (six) hours as needed for wheezing or shortness of breath. 1 Inhaler 5  . aspirin 325 MG EC tablet Take 325 mg by mouth daily.    Marland Kitchen atorvastatin (LIPITOR) 40 MG tablet TAKE 1 TABLET (40 MG TOTAL) BY MOUTH DAILY. 90 tablet 0  . diazepam (VALIUM) 5 MG tablet TAKE 1/2 TO 1 TABLET BY MOUTH TWICE A DAY AS NEEDED 30 tablet 2  . gabapentin (NEURONTIN) 100 MG capsule Take 1 capsule (100 mg total) by mouth 3 (three) times daily. 90 capsule 1  . levothyroxine (SYNTHROID, LEVOTHROID) 100 MCG tablet Take 1 tablet (100 mcg total) by mouth daily. 90 tablet 3  . umeclidinium bromide (INCRUSE ELLIPTA) 62.5 MCG/INH AEPB Inhale 1 puff into the lungs daily. 1 each 5   Current Facility-Administered Medications on File Prior to Visit  Medication Dose Route  Frequency Provider Last Rate Last Admin  . 0.9 %  sodium chloride infusion  500 mL Intravenous Once Armbruster, Carlota Raspberry, MD        BP 128/60   Pulse 71   Temp (!) 97.2 F (36.2 C) (Other (Comment))   Ht 5' 4.5" (1.638 m)   Wt 169 lb (76.7 kg)   SpO2 98%   BMI 28.56 kg/m       Objective:   Physical Exam Vitals and nursing note reviewed.  Constitutional:      General: She is not in acute distress.    Appearance: Normal appearance. She is well-developed. She is not ill-appearing.  HENT:     Head: Normocephalic and atraumatic.     Right Ear: Tympanic membrane, ear canal and external ear normal. There is no impacted cerumen.     Left Ear: Tympanic membrane, ear canal and external ear normal. There is no impacted cerumen.     Nose: Nose normal. No congestion or rhinorrhea.     Mouth/Throat:     Mouth: Mucous membranes are moist.     Pharynx: Oropharynx is clear. No oropharyngeal exudate or posterior oropharyngeal erythema.  Eyes:     General: No scleral icterus.       Right eye: No discharge.        Left eye: No discharge.     Extraocular Movements: Extraocular movements intact.     Conjunctiva/sclera: Conjunctivae normal.     Pupils: Pupils are equal, round, and reactive to light.  Neck:     Thyroid: No thyromegaly.     Vascular: No carotid bruit.     Trachea: No tracheal deviation.  Cardiovascular:     Rate and Rhythm: Normal rate and regular rhythm.     Pulses: Normal pulses.     Heart sounds: Normal heart sounds. No murmur. No friction rub. No gallop.   Pulmonary:     Effort: Pulmonary effort is normal. No respiratory distress.     Breath sounds: Normal breath sounds. No stridor. No wheezing, rhonchi or rales.  Chest:     Chest wall: No tenderness.  Abdominal:     General: Abdomen is flat. Bowel sounds are normal. There is no distension.  Palpations: Abdomen is soft. There is no mass.     Tenderness: There is no abdominal tenderness. There is no right CVA  tenderness, left CVA tenderness, guarding or rebound.     Hernia: No hernia is present.  Musculoskeletal:        General: Tenderness (throughout lower back ) present. No swelling, deformity or signs of injury. Normal range of motion.     Cervical back: Normal range of motion and neck supple.     Right lower leg: No edema.     Left lower leg: No edema.  Lymphadenopathy:     Cervical: No cervical adenopathy.  Skin:    General: Skin is warm and dry.     Coloration: Skin is not jaundiced or pale.     Findings: No bruising, erythema, lesion or rash.  Neurological:     General: No focal deficit present.     Mental Status: She is alert and oriented to person, place, and time.     Cranial Nerves: No cranial nerve deficit.     Sensory: No sensory deficit.     Motor: No weakness.     Coordination: Coordination normal.     Gait: Gait normal.     Deep Tendon Reflexes: Reflexes normal.  Psychiatric:        Mood and Affect: Mood normal.        Behavior: Behavior normal.        Thought Content: Thought content normal.        Judgment: Judgment normal.       Assessment & Plan:  1. Routine general medical examination at a health care facility - Stay as active as possible. Encouraged heart healthy diet.  - Follow up in one year or sooner if needed - CBC with Differential/Platelet - Comprehensive metabolic panel - Lipid panel - TSH  2. Pulmonary emphysema, unspecified emphysema type ([place]) - Follow up with Pulmonary as directed  3. HYPOTHYROIDISM, POST-RADIOACTIVE IODINE - Consider dose change of synthroid  - CBC with Differential/Platelet - Comprehensive metabolic panel - Lipid panel - TSH  4. Gastroesophageal reflux disease without esophagitis - Continue with PPI   5. Vertigo - continue with valium PRN   6. Mixed hyperlipidemia - Consider change in statin  - CBC with Differential/Platelet - Comprehensive metabolic panel - Lipid panel - TSH  7. Chronic midline low back pain  with left-sided sciatica  - Ambulatory referral to Pain Clinic  [name], NP

## 2019-10-20 NOTE — Patient Instructions (Addendum)
Health Maintenance Due  Topic Date Due  . DEXA SCAN  10/11/2013    Depression screen [facility] 2/9 10/20/2019 07/29/2019 11/03/2018  Decreased Interest 0 1 0  Down, Depressed, Hopeless 0 1 0  PHQ - 2 Score 0 2 0  Altered sleeping - 2 0  Tired, decreased energy - 3 0  Change in appetite - 0 0  Feeling bad or failure about yourself  - 0 0  Trouble concentrating - 1 0  Moving slowly or fidgety/restless - 0 0  Suicidal thoughts - 0 0  PHQ-9 Score - 8 0  Difficult doing work/chores - Somewhat difficult -

## 2019-11-11 ENCOUNTER — Telehealth: Payer: Self-pay | Admitting: Adult Health

## 2019-11-11 NOTE — Telephone Encounter (Signed)
Pt states she called the number given to her but they are stating she needs a referral in order for her to be seen.   Pt would like a call back at [phone]

## 2019-11-11 NOTE — Telephone Encounter (Signed)
Pt is calling to follow up on a referral for pain management. Per pt, it's been a month and has not heard anything regarding the referral. Thanks

## 2019-11-11 NOTE — Telephone Encounter (Signed)
Checked the referral notes.  She is already and patient so she need only to call and schedule an appointment.  Information given to the pt.  Nothing further needed.  [facility] in [place], [place] Address: [address], [place], [place] [address] Phone: [phone]

## 2019-11-11 NOTE — Telephone Encounter (Signed)
Left a message for Stacy Garcia to return my call.  Left patient information.

## 2019-11-12 NOTE — Telephone Encounter (Signed)
Stacy Garcia (Armed forces training and education officer) notified of this.

## 2019-11-16 NOTE — Telephone Encounter (Signed)
[  12:01 PM] Stacy Garcia     hi Eylin Pontarelli yes I spoke with the patient 11-15-2019 and they have her scheduled  informed the pt note is in her chart

## 2019-11-19 ENCOUNTER — Ambulatory Visit: Payer: Medicare HMO | Admitting: Physical Medicine & Rehabilitation

## 2019-11-23 ENCOUNTER — Ambulatory Visit (INDEPENDENT_AMBULATORY_CARE_PROVIDER_SITE_OTHER): Payer: Medicare HMO | Admitting: Orthopaedic Surgery

## 2019-11-23 ENCOUNTER — Ambulatory Visit (INDEPENDENT_AMBULATORY_CARE_PROVIDER_SITE_OTHER): Payer: Medicare HMO

## 2019-11-23 ENCOUNTER — Other Ambulatory Visit: Payer: Self-pay

## 2019-11-23 ENCOUNTER — Encounter: Payer: Self-pay | Admitting: Orthopaedic Surgery

## 2019-11-23 VITALS — Ht 64.0 in | Wt 170.0 lb

## 2019-11-23 DIAGNOSIS — G8929 Other chronic pain: Secondary | ICD-10-CM

## 2019-11-23 DIAGNOSIS — M5116 Intervertebral disc disorders with radiculopathy, lumbar region: Secondary | ICD-10-CM | POA: Diagnosis not present

## 2019-11-23 DIAGNOSIS — M25562 Pain in left knee: Secondary | ICD-10-CM

## 2019-11-23 NOTE — Addendum Note (Signed)
Addended by: Lendon Collar on: 11/23/2019 03:57 PM   Modules accepted: Orders

## 2019-11-23 NOTE — Progress Notes (Signed)
Office Visit Note   Patient: Stacy Garcia           Date of Birth: January 06, 1951           MRN: [id number] Visit Date: 11/23/2019              Requested by: [name], NP [address] [place],  [place] [address] PCP: [name], NP   Assessment & Plan: Visit Diagnoses:  1. Chronic pain of left knee   2. Lumbar disc disease with radiculopathy     Plan: Mrs. Weck has had a recurrence of her low back pain and right lower extremity radiculopathy.  She had an MRI scan in 2019 demonstrating potential symptomatic right-sided neural impingement at L3-4 and L4-5 and possible central and leftward protrusions at L1-2 and L2-3 that could contribute to left-sided symptoms.  She is not experiencing any symptoms on the left.  In the past she has had a Medrol Dosepak but notes that her face turned very "red".  And she like to avoid that.  She would like not to have any needles.  She is also is not interested in surgery.  She was working out at [employer] but because of Covid has not been able exercise.  She stopped smoking and has gained a little weight.  Her present symptoms are very similar to what she has experienced in the past.  Discussion regarding treatment options including either facet blocks or epidural steroid injection.  Again, she like to avoid needles.  She does not want any medicine but would like to try physical therapy.  We will set this up for her and monitor response.  I might consider a repeat MRI scan. She has had a prior right total knee replacement and doing well from that standpoint although on occasion he gets "stiff".  She is having a bit more trouble on the left but x-rays demonstrate only mild arthritis.  Should continue with over-the-counter medicines.  She was relieved to know that she did not need a knee replacement. Total time spent 30 minutes discussing all the above with counseling 50%  Follow-Up Instructions: Return if symptoms worsen or fail to improve.    Orders:  Orders Placed This Encounter  Procedures  . XR KNEE 3 VIEW LEFT  . XR Lumbar Spine 2-3 Views   No orders of the defined types were placed in this encounter.     Procedures: No procedures performed   Clinical Data: No additional findings.   Subjective: Chief Complaint  Patient presents with  . Lower Back - Pain  Patient presents today for recurrent lower back pain. Patient states that the pain goes down to her legs. No numbness or tingling. She had an MRI in 2019. She takes Advil or Aleve. She said that the pain is constant and nothing makes it worse.  No bowel or bladder changes.   She also has complaints of pain in her left knee. She has been hurting for 4-5 months. No known injury. Her pain is located anteriorly and posteriorly. Minimal swelling. She states that it gives way. No grinding or catching. She has a history of right total knee arthroplasty and does not want to get injections in her left knee as she did before in her right knee. Back pain associated with right lower extremity radiculopathy is similar to what she had several years ago.  MRI scan results are listed as above.  She tried a Medrol Dosepak but had a reaction  with "a red hot face".  She would like to avoid that.  She is also deathly afraid of needles and does not want to even consider an injection.  She was working out before [employer].  She stopped smoking and has gained a little weight. She obviously is little concerned about her left knee as she has had a prior right total knee replacement  HPI  Review of Systems  Constitutional: Negative for fatigue.  HENT: Negative for ear pain.   Eyes: Negative for pain.  Respiratory: Negative for shortness of breath.   Cardiovascular: Negative for leg swelling.  Gastrointestinal: Positive for constipation. Negative for diarrhea.  Endocrine: Negative for cold intolerance and heat intolerance.  Genitourinary: Negative for difficulty urinating.   Musculoskeletal: Negative for joint swelling.  Skin: Negative for rash.  Allergic/Immunologic: Negative for food allergies.  Neurological: Positive for weakness.  Hematological: Does not bruise/bleed easily.  Psychiatric/Behavioral: Positive for sleep disturbance.     Objective: Vital Signs: Ht 5\' 4"  (1.626 m)   Wt 170 lb (77.1 kg)   BMI 29.18 kg/m   Physical Exam Constitutional:      Appearance: She is well-developed.  Eyes:     Pupils: Pupils are equal, round, and reactive to light.  Pulmonary:     Effort: Pulmonary effort is normal.  Skin:    General: Skin is warm and dry.  Neurological:     Mental Status: She is alert and oriented to person, place, and time.  Psychiatric:        Behavior: Behavior normal.     Ortho Exam awake alert and oriented x3.  Comfortable sitting.  Straight leg raise negative bilaterally.  Sensory exam intact.  Motor exam intact.  Painless range of motion both hips.  No areas of pain about her right total knee replacement.  Mild medial joint pain left knee without effusion or instability.  Full extension of flexed over 110 degrees.  No popliteal pain.  No calf pain.  No percussible tenderness of the upper middle lower lumbar spine.  No pain over the greater trochanter of either hip.  Specialty Comments:  No specialty comments available.  Imaging: XR KNEE 3 VIEW LEFT  Result Date: 11/23/2019 Films of the left knee are obtained in several projections standing.  There is mild decrease in the medial joint space with some subchondral sclerosis.  Alignment appears to be neutral.  Has had a prior right total knee replacement that appears to be in good position on the one film AP.  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  Mild degenerative changes at the patellofemoral joint and lateral compartment  XR Lumbar Spine 2-3 Views  Result Date: 11/23/2019 Films of the lumbar spine obtained in 2  projections.  There is a degenerative right scoliosis of approximately 10 degrees.  There is some calcification of the abdominal aorta without obvious aneurysmal dilatation.  There are facet joint changes at L4-5 and L5-S1 and slight retrolisthesis of 3 on 4.  Poor visualization of the upper lumbar spine but might have some mild compression of L1 or 2.  Had MRI scan of lumbar spine in 2018 without any worrisome lesions or fracture    PMFS History: Patient Active Problem List   Diagnosis Date Noted  . Mixed hyperlipidemia 10/20/2019  . Vertigo 10/20/2019  . COPD exacerbation ([place]) 06/03/2018  . Hematuria 06/03/2018  . Vaginitis and vulvovaginitis 02/03/2018  . Lumbar disc disease with radiculopathy 12/15/2017  . Gastroesophageal reflux  disease without esophagitis 11/13/2017  . S/P AAA repair 11/13/2017  . Special screening for malignant neoplasms, colon 11/13/2017  . AAA (abdominal aortic aneurysm) ([place]) 02/05/2017  . Primary osteoarthritis of right knee 01/23/2016  . S/P total knee replacement using cement 01/23/2016  . Peptic ulcer associated with Helicobacter pylori infection 05/03/2014  . Pain of left leg 04/28/2014  . AAA (abdominal aortic aneurysm) without rupture ([place]) 03/08/2013  . Unilateral occipital headache 04/20/2012  . Pain in left knee 11/19/2010  . SHOULDER PAIN, BILATERAL 07/17/2010  . WRIST PAIN, RIGHT 02/15/2010  . COPD (chronic obstructive pulmonary disease) ([place]) 08/01/2008  . HYPOTHYROIDISM, POST-RADIOACTIVE IODINE 07/20/2007  . TOBACCO ABUSE 07/20/2007  . HOT FLASHES 07/20/2007  . BENIGN POSITIONAL VERTIGO, HX OF 07/20/2007  . INFLAMMATORY DISEASE OF BREAST 07/19/2007  . FIBROCYSTIC BREAST DISEASE 04/01/2007   Past Medical History:  Diagnosis Date  . AAA (abdominal aortic aneurysm) ([place])   . Arthritis   . COPD (chronic obstructive pulmonary disease) ([place])   . Dyspnea    just recently started wheezing  . GERD (gastroesophageal reflux disease)   .  Hyperlipidemia   . Hypothyroidism   . Thyroid disease   . Tobacco abuse   . Vertigo     Family History  Problem Relation Age of Onset  . Thyroid disease Other   . Diabetes Other   . Hyperlipidemia Other   . Hypertension Other     Past Surgical History:  Procedure Laterality Date  . ABDOMINAL AORTIC ANEURYSM REPAIR N/A 02/05/2017   Procedure: ANEURYSM ABDOMINAL AORTIC REPAIR;  Surgeon: Elam Dutch, MD;  Location: [facility];  Service: Vascular;  Laterality: N/A;  . CESAREAN SECTION     x 3  . JOINT REPLACEMENT    . SHOULDER SURGERY  02/2015  . TOTAL KNEE ARTHROPLASTY Right 01/23/2016   Procedure: TOTAL KNEE ARTHROPLASTY;  Surgeon: Garald Balding, MD;  Location: [facility];  Service: Orthopedics;  Laterality: Right;  . TUBAL LIGATION     Social History   Occupational History  . Occupation: Designer/manufacturing    Comment: Librarian, academic; retired 2017  Tobacco Use  . Smoking status: Former Smoker    Packs/day: 1.00    Years: 50.00    Pack years: 50.00    Types: Cigarettes    Quit date: 12/31/2016    Years since quitting: 2.8  . Smokeless tobacco: Never Used  Substance and Sexual Activity  . Alcohol use: No    Alcohol/week: 0.0 standard drinks  . Drug use: No  . Sexual activity: Not Currently

## 2019-12-03 ENCOUNTER — Ambulatory Visit: Payer: Medicare HMO | Admitting: Physical Therapy

## 2019-12-06 ENCOUNTER — Other Ambulatory Visit: Payer: Self-pay | Admitting: Adult Health

## 2019-12-08 NOTE — Telephone Encounter (Signed)
Sent to the pharmacy by e-scribe. 

## 2019-12-16 ENCOUNTER — Other Ambulatory Visit: Payer: Self-pay | Admitting: *Deleted

## 2019-12-16 DIAGNOSIS — I714 Abdominal aortic aneurysm, without rupture, unspecified: Secondary | ICD-10-CM

## 2019-12-23 ENCOUNTER — Other Ambulatory Visit: Payer: Self-pay

## 2019-12-23 ENCOUNTER — Encounter: Payer: Self-pay | Admitting: Vascular Surgery

## 2019-12-23 ENCOUNTER — Ambulatory Visit (INDEPENDENT_AMBULATORY_CARE_PROVIDER_SITE_OTHER): Payer: Medicare HMO | Admitting: Vascular Surgery

## 2019-12-23 ENCOUNTER — Ambulatory Visit (HOSPITAL_COMMUNITY)
Admission: RE | Admit: 2019-12-23 | Discharge: 2019-12-23 | Disposition: A | Discharge status: Home / Self Care | Payer: Medicare HMO | Source: Ambulatory Visit | Attending: Vascular Surgery | Admitting: Vascular Surgery

## 2019-12-23 VITALS — BP 132/89 | HR 80 | Temp 97.3°F | Resp 20 | Ht 64.0 in | Wt 170.0 lb

## 2019-12-23 DIAGNOSIS — I714 Abdominal aortic aneurysm, without rupture, unspecified: Secondary | ICD-10-CM

## 2019-12-23 NOTE — Progress Notes (Signed)
Patient is a 69 year old female who returns for follow-up today.  She underwent repair of abdominal aortic aneurysm with open tube graft repair May 2018.  About 3 months ago she noticed a lump just to the left of her umbilicus.  This comes and goes.  It is occasionally painful.  He has also had considerable weight gain since quitting smoking.  She is on aspirin and a statin.  Current Outpatient Medications on File Prior to Visit  Medication Sig Dispense Refill  . albuterol (VENTOLIN HFA) 108 (90 Base) MCG/ACT inhaler Inhale 2 puffs into the lungs every 6 (six) hours as needed for wheezing or shortness of breath. 1 Inhaler 5  . aspirin 325 MG EC tablet Take 325 mg by mouth daily.    Marland Kitchen atorvastatin (LIPITOR) 40 MG tablet TAKE 1 TABLET EVERY DAY 90 tablet 3  . diazepam (VALIUM) 5 MG tablet TAKE 1/2 TO 1 TABLET BY MOUTH TWICE A DAY AS NEEDED 30 tablet 2  . levothyroxine (SYNTHROID) 88 MCG tablet Take 1 tablet (88 mcg total) by mouth daily. 90 tablet 3  . umeclidinium bromide (INCRUSE ELLIPTA) 62.5 MCG/INH AEPB Inhale 1 puff into the lungs daily. 1 each 5  . gabapentin (NEURONTIN) 100 MG capsule Take 1 capsule (100 mg total) by mouth 3 (three) times daily. (Patient not taking: Reported on 12/23/2019) 90 capsule 1   No current facility-administered medications on file prior to visit.     Review of systems: She has shortness of breath with exertion.  She does not have chest pain.  He has no claudication symptoms.  Physical exam:  Vitals:   12/23/19 0833  BP: 132/89  Pulse: 80  Resp: 20  Temp: (!) 97.3 F (36.3 C)  SpO2: 95%  Weight: 170 lb (77.1 kg)  Height: 5\' 4"  (1.626 m)    Abdomen: Soft nontender nondistended no obvious protrusion of the abdomen.  However where the patient states that she occasionally feels this lump I can feel what is about a 3 cm defect in the fascia just adjacent to the umbilicus on the left side.  She has no pulsatile mass.  She has no evidence of current  incarceration of this hernia.  Data: Patient had a duplex ultrasound of her abdominal aorta today.  I reviewed and interpreted the study.  Aortic diameter was 2.43 cm.  No evidence of proximal or distal anastomotic aneurysm or abdominal aortic aneurysm.  Assessment: Doing well status post repair of abdominal aortic aneurysm.  Small abdominal hernia defect adjacent to the umbilicus.  Patient currently states it is not bothersome enough for her to consider an intervention.  Plan: The patient will follow up with me in a year's time to check for perianastomotic pseudoaneurysms.  Otherwise if her hernia begins to bother her more we will refer her to general surgery.  Ruta Hinds, MD [facility] Office: [phone]

## 2019-12-24 ENCOUNTER — Other Ambulatory Visit: Payer: Self-pay | Admitting: *Deleted

## 2019-12-24 DIAGNOSIS — I714 Abdominal aortic aneurysm, without rupture, unspecified: Secondary | ICD-10-CM

## 2020-01-24 ENCOUNTER — Other Ambulatory Visit: Payer: Self-pay | Admitting: Adult Health

## 2020-01-24 DIAGNOSIS — J439 Emphysema, unspecified: Secondary | ICD-10-CM

## 2020-03-05 ENCOUNTER — Other Ambulatory Visit: Payer: Self-pay | Admitting: Physical Medicine & Rehabilitation

## 2020-03-08 ENCOUNTER — Ambulatory Visit (INDEPENDENT_AMBULATORY_CARE_PROVIDER_SITE_OTHER): Payer: Medicare HMO

## 2020-03-08 VITALS — Ht 64.0 in | Wt 164.0 lb

## 2020-03-08 DIAGNOSIS — Z0000 Encounter for general adult medical examination without abnormal findings: Secondary | ICD-10-CM

## 2020-03-08 NOTE — Progress Notes (Signed)
Subjective:   Stacy Garcia is a 69 y.o. female who presents for Medicare Annual (Subsequent) preventive examination. . Virtual Visit via Telephone Note  I connected with  Stacy Garcia on 03/08/20 at  9:45 AM EDT by telephone and verified that I am speaking with the correct person using two identifiers.  Medicare Annual Wellness visit completed telephonically due to Covid-19 pandemic.   Location: Patient: Home Provider: Office Participating in call health coach and patient   I discussed the limitations, risks, security and privacy concerns of performing an evaluation and management service by telephone and the availability of in person appointments. The patient expressed understanding and agreed to proceed.  Unable to perform video visit due to video visit attempted and failed and/or patient does not have video capability.   Some vital signs may be absent or patient reported.   Stacy Brace, LPN        Review of Systems        Objective:    Today's Vitals   03/08/20 0944 03/08/20 0945  Weight: 164 lb (74.4 kg)   Height: 5\' 4"  (1.626 m)   PainSc:  5    Body mass index is 28.15 kg/m.  Advanced Directives 03/08/2020 12/23/2019 11/17/2018 11/03/2018 03/13/2017 02/06/2017 01/28/2017  Does Patient Have a Medical Advance Directive? No No Yes Yes Yes No No  Type of Advance Directive - Public librarian;Living will [place];Living will [place] in Chart? - - No - copy requested No - copy requested - - -  Would patient like information on creating a medical advance directive? No - Patient declined No - Patient declined - - - No - Patient declined No - Patient declined    Current Medications (verified) Outpatient Encounter Medications as of 03/08/2020  Medication Sig  . aspirin 325 MG EC tablet Take 325 mg by mouth daily.  Marland Kitchen atorvastatin (LIPITOR) 40 MG tablet TAKE 1 TABLET EVERY  DAY  . diazepam (VALIUM) 5 MG tablet TAKE 1/2 TO 1 TABLET BY MOUTH TWICE A DAY AS NEEDED  . levothyroxine (SYNTHROID) 88 MCG tablet Take 1 tablet (88 mcg total) by mouth daily.  . [DISCONTINUED] albuterol (VENTOLIN HFA) 108 (90 Base) MCG/ACT inhaler Inhale 2 puffs into the lungs every 6 (six) hours as needed for wheezing or shortness of breath. (Patient not taking: Reported on 03/08/2020)  . [DISCONTINUED] gabapentin (NEURONTIN) 100 MG capsule Take 1 capsule (100 mg total) by mouth 3 (three) times daily. (Patient not taking: Reported on 12/23/2019)  . [DISCONTINUED] umeclidinium bromide (INCRUSE ELLIPTA) 62.5 MCG/INH AEPB Inhale 1 puff into the lungs daily.   No facility-administered encounter medications on file as of 03/08/2020.    Allergies (verified) Prednisone   History: Past Medical History:  Diagnosis Date  . AAA (abdominal aortic aneurysm) ([place])   . Arthritis   . COPD (chronic obstructive pulmonary disease) ([place])   . Dyspnea    just recently started wheezing  . GERD (gastroesophageal reflux disease)   . Hyperlipidemia   . Hypothyroidism   . Thyroid disease   . Tobacco abuse   . Vertigo    Past Surgical History:  Procedure Laterality Date  . ABDOMINAL AORTIC ANEURYSM REPAIR N/A 02/05/2017   Procedure: ANEURYSM ABDOMINAL AORTIC REPAIR;  Surgeon: Elam Dutch, MD;  Location: [facility] OR;  Service: Vascular;  Laterality: N/A;  . CESAREAN SECTION     x 3  .  JOINT REPLACEMENT    . SHOULDER SURGERY  02/2015  . TOTAL KNEE ARTHROPLASTY Right 01/23/2016   Procedure: TOTAL KNEE ARTHROPLASTY;  Surgeon: Garald Balding, MD;  Location: [facility];  Service: Orthopedics;  Laterality: Right;  . TUBAL LIGATION     Family History  Problem Relation Age of Onset  . Thyroid disease Other   . Diabetes Other   . Hyperlipidemia Other   . Hypertension Other    Social History   Socioeconomic History  . Marital status: Married    Spouse name: Not on file  . Number of children: 3  . Years of  education: Not on file  . Highest education level: Not on file  Occupational History  . Occupation: Designer/manufacturing    Comment: Librarian, academic; retired 2017  . 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 Smoking status: Former Smoker    Packs/day: 1.00    Years: 50.00    Pack years: 50.00    Types: Cigarettes    Quit date: 12/31/2016    Years since quitting: 3.1  . Smokeless tobacco: Never Used  Vaping Use  . Vaping Use: Never used  Substance and Sexual Activity  . Alcohol use: No    Alcohol/week: 0.0 standard drinks  . Drug use: No  . Sexual activity: Not Currently  Other Topics Concern  . Not on file  Social History Narrative   11/03/2018:   Lives with husband in 2 story home   Has 3 children, 7 grandchildren, 54 great-grandchildren, all of whom live close by   Takes care of two of her great grandchildren (72 yo and 27yo)   Horticulturist, commercial working, 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   Social Determinants of Health   Financial Resource Strain: Low Risk   . Difficulty of Paying Living Expenses: Not hard at all  Food Insecurity: No Food Insecurity  . Worried About Charity fundraiser in the Last Year: Never true  . Ran Out of Food in the Last Year: Never true  Transportation Needs: No Transportation Needs  . Lack of Transportation (Medical): No  . Lack of Transportation (Non-Medical): No  Physical Activity: Insufficiently Active  . Days of Exercise per Week: 2 days  . Minutes of Exercise per Session: 30 min  Stress: No Stress Concern Present  . Feeling of Stress : Only a little  Social Connections: Socially Integrated  . Frequency of Communication with Friends and Family: More than three times a week  . Frequency of Social Gatherings with Friends and Family: More than three times a week  . Attends Religious Services: More than 4 times per year  . Active Member of Clubs or Organizations: Yes  . Attends  Archivist Meetings: More than 4 times per year  . Marital Status: Married    Tobacco Counseling Counseling given: Not Answered   Clinical Intake:  Pre-visit preparation completed: Yes  Pain : 0-10 Pain Score: 5  Pain Type: Chronic pain Pain Location: Abdomen (left side) Pain Orientation: Left Pain Descriptors / Indicators: Stabbing Pain Onset: More than a month ago Pain Frequency: Intermittent     BMI - recorded: 28.15 Nutritional Status: BMI 25 -29 Overweight Nutritional Risks: None Diabetes: No  How often do you need to have someone help you when you read instructions, pamphlets, or other written materials from your doctor or pharmacy?: 1 - Never    Interpreter Needed?: No  Information entered  by :: Charlott Rakes, LPN   Activities of Daily Living In your present state of health, do you have any difficulty performing the following activities: 03/08/2020 10/20/2019  Hearing? N N  Vision? N N  Difficulty concentrating or making decisions? N N  Walking or climbing stairs? Y Y  Comment get short of breath at times "weak legs"  Dressing or bathing? N N  Doing errands, shopping? N N  Preparing Food and eating ? N -  Using the Toilet? N -  In the past six months, have you accidently leaked urine? Y -  Comment urgency at times -  Do you have problems with loss of bowel control? N -  Managing your Medications? N -  Managing your Finances? N -  Housekeeping or managing your Housekeeping? N -  Some recent data might be hidden    Patient Care Team: [name], NP as PCP - General (Family Medicine) Garald Balding, MD as Consulting Physician (Orthopedic Surgery) Chesley Mires, MD as Consulting Physician (Pulmonary Disease)  Indicate any recent Medical Services you may have received from other than [facility] providers in the past year (date may be approximate).     Assessment:   This is a routine wellness examination for Martell.  Hearing/Vision screen   Hearing Screening   125Hz  250Hz  500Hz  1000Hz  2000Hz  3000Hz  4000Hz  6000Hz  8000Hz   Right ear:           Left ear:           Comments: Pt denies hearing difficulty at this time  Vision Screening Comments: Pt denies eye exam at this time and states she will follow up with [facility] as needed  Dietary issues and exercise activities discussed: Current Exercise Habits: Home exercise routine, Type of exercise: walking, Time (Minutes): 30, Frequency (Times/Week): 2, Weekly Exercise (Minutes/Week): 60, Intensity: Mild, Exercise limited by: respiratory conditions(s);cardiac condition(s)  Goals    . Patient Stated     Get back on stationary bike!     . Patient Stated     Patient states she wants to lose 30lbs      Depression Screen PHQ 2/9 Scores 03/08/2020 10/20/2019 07/29/2019 11/03/2018 09/15/2018 10/16/2015 04/28/2014  PHQ - 2 Score 0 0 2 0 0 0 0  PHQ- 9 Score 1 - 8 0 - - -    Fall Risk Fall Risk  03/08/2020 10/20/2019 07/29/2019 11/03/2018 09/15/2018  Falls in the past year? 0 0 0 0 0  Number falls in past yr: 0 0 - - -  Injury with Fall? 0 0 - - -  Risk for fall due to : Impaired balance/gait - - - -  Risk for fall due to: Comment vertigo episodes at times - - - -  Follow up Falls prevention discussed - - - -    Any stairs in or around the home? Yes  If so, are there any without handrails? Yes  Home free of loose throw rugs in walkways, pet beds, electrical cords, etc? Yes  Adequate lighting in your home to reduce risk of falls? Yes   ASSISTIVE DEVICES UTILIZED TO PREVENT FALLS: Use of a cane, walker or w/c? No  Grab bars in the bathroom? No  Shower chair or bench in shower? No  Elevated toilet seat or a handicapped toilet? No   TIMED UP AND GO:  Was the test performed? No .     Cognitive Function:     6CIT Screen 03/08/2020  What Year? 0 points  What month?  0 points  What time? 0 points  Count back from 20 0 points  Months in reverse 0 points  Repeat phrase 0 points  Total  Score 0    Immunizations Immunization History  Administered Date(s) Administered  . Fluad Quad(high Dose 65+) 06/14/2019  . Influenza Whole 07/20/2007, 07/17/2010  . Influenza, High Dose Seasonal PF 06/19/2017, 07/01/2018  . Influenza,inj,Quad PF,6+ Mos 09/01/2013, 10/26/2015  . Pneumococcal Conjugate-13 06/19/2017  . Pneumococcal Polysaccharide-23 08/01/2008, 10/26/2015  . Td 09/09/2001  . Tdap 02/24/2012    TDAP status: Up to date Flu Vaccine status: Up to date Pneumococcal vaccine status: Up to date Covid-19 vaccine status: Completed vaccines  Qualifies for Shingles Vaccine? Yes   Zostavax completed No   Shingrix Completed?: No.    Education has been provided regarding the importance of this vaccine. Patient has been advised to call insurance company to determine out of pocket expense if they have not yet received this vaccine. Advised may also receive vaccine at local pharmacy or Health Dept. Verbalized acceptance and understanding.  Screening Tests Health Maintenance  Topic Date Due  . COVID-19 Vaccine (1) Never done  . DEXA SCAN  Never done  . INFLUENZA VACCINE  04/09/2020  . COLONOSCOPY  01/06/2021  . MAMMOGRAM  06/30/2021  . TETANUS/TDAP  02/23/2022  . Hepatitis C Screening  Completed  . PNA vac Low Risk Adult  Completed    Health Maintenance  Health Maintenance Due  Topic Date Due  . COVID-19 Vaccine (1) Never done  . DEXA SCAN  Never done    Colorectal cancer screening: Completed 01/06/18. Repeat every 3 years Mammogram status: Completed 07/01/19. Repeat every year Bone density: Pt declined at this time  Additional Screening:  Hepatitis C Screening:  Completed 06/19/17  Vision Screening: Recommended annual ophthalmology exams for early detection of glaucoma and other disorders of the eye. Is the patient up to date with their annual eye exam?  No  Who is the provider or what is the name of the office in which the patient attends annual eye exams?  [facility] If pt is not established with a provider, would they like to be referred to a provider to establish care? No .   Dental Screening: Recommended annual dental exams for proper oral hygiene  Community Resource Referral / Chronic Care Management: CRR required this visit?  No   CCM required this visit?  No      Plan:     I have personally reviewed and noted the following in the patient's chart:   . Medical and social history . Use of alcohol, tobacco or illicit drugs  . Current medications and supplements . Functional ability and status . Nutritional status . Physical activity . Advanced directives . List of other physicians . Hospitalizations, surgeries, and ER visits in previous 12 months . Vitals . Screenings to include cognitive, depression, and falls . Referrals and appointments  In addition, I have reviewed and discussed with patient certain preventive protocols, quality metrics, and best practice recommendations. A written personalized care plan for preventive services as well as general preventive health recommendations were provided to patient.     Stacy Brace, LPN   [id number]   Nurse Notes: Pt states she is experiencing intermittent pain in left side abdomen related to a hernia and is following up with Dr Oneida Alar for possible surgery if persisits.

## 2020-03-08 NOTE — Patient Instructions (Addendum)
Stacy Garcia , Thank you for taking time to come for your Medicare Wellness Visit. I appreciate your ongoing commitment to your health goals. Please review the following plan we discussed and let me know if I can assist you in the future.   Screening recommendations/referrals: Colonoscopy: Done 01/06/18 Mammogram: Done 07/01/19 Bone Density: Pt decline at this time Recommended yearly ophthalmology/optometry visit for glaucoma screening and checkup Recommended yearly dental visit for hygiene and checkup  Vaccinations: Influenza vaccine: Up to date Pneumococcal vaccine: Up to date Tdap vaccine: Up to date Shingles vaccine: Shingrix discussed. Please contact your pharmacy for coverage information.     Covid-19:completed 1/22 &10/22/19  Advanced directives: Advance directive discussed with you today. Even though you declined this today please call our office should you change your mind and we can give you the proper paperwork for you to fill out.   Conditions/risks identified: goal is to lose 30lbs within this year  Next appointment: Follow up in one year for your annual wellness visit    Preventive Care 65 Years and Older, Female Preventive care refers to lifestyle choices and visits with your health care provider that can promote health and wellness. What does preventive care include?  A yearly physical exam. This is also called an annual well check.  Dental exams once or twice a year.  Routine eye exams. Ask your health care provider how often you should have your eyes checked.  Personal lifestyle choices, including:  Daily care of your teeth and gums.  Regular physical activity.  Eating a healthy diet.  Avoiding tobacco and drug use.  Limiting alcohol use.  Practicing safe sex.  Taking low-dose aspirin every day.  Taking vitamin and mineral supplements as recommended by your health care provider. What happens during an annual well check? The services and screenings  done by your health care provider during your annual well check will depend on your age, overall health, lifestyle risk factors, and family history of disease. Counseling  Your health care provider may ask you questions about your:  Alcohol use.  Tobacco use.  Drug use.  Emotional well-being.  Home and relationship well-being.  Sexual activity.  Eating habits.  History of falls.  Memory and ability to understand (cognition).  Work and work Statistician.  Reproductive health. Screening  You may have the following tests or measurements:  Height, weight, and BMI.  Blood pressure.  Lipid and cholesterol levels. These may be checked every 5 years, or more frequently if you are over 60 years old.  Skin check.  Lung cancer screening. You may have this screening every year starting at age 79 if you have a 30-pack-year history of smoking and currently smoke or have quit within the past 15 years.  Fecal occult blood test (FOBT) of the stool. You may have this test every year starting at age 41.  Flexible sigmoidoscopy or colonoscopy. You may have a sigmoidoscopy every 5 years or a colonoscopy every 10 years starting at age 86.  Hepatitis C blood test.  Hepatitis B blood test.  Sexually transmitted disease (STD) testing.  Diabetes screening. This is done by checking your blood sugar (glucose) after you have not eaten for a while (fasting). You may have this done every 1-3 years.  Bone density scan. This is done to screen for osteoporosis. You may have this done starting at age 59.  Mammogram. This may be done every 1-2 years. Talk to your health care provider about how often you should have regular  mammograms. Talk with your health care provider about your test results, treatment options, and if necessary, the need for more tests. Vaccines  Your health care provider may recommend certain vaccines, such as:  Influenza vaccine. This is recommended every year.  Tetanus,  diphtheria, and acellular pertussis (Tdap, Td) vaccine. You may need a Td booster every 10 years.  Zoster vaccine. You may need this after age 27.  Pneumococcal 13-valent conjugate (PCV13) vaccine. One dose is recommended after age 30.  Pneumococcal polysaccharide (PPSV23) vaccine. One dose is recommended after age 103. Talk to your health care provider about which screenings and vaccines you need and how often you need them. This information is not intended to replace advice given to you by your health care provider. Make sure you discuss any questions you have with your health care provider. Document Released: 09/22/2015 Document Revised: 05/15/2016 Document Reviewed: 06/27/2015 Elsevier Interactive Patient Education  2017 [place] Prevention in the Home Falls can cause injuries. They can happen to people of all ages. There are many things you can do to make your home safe and to help prevent falls. What can I do on the outside of my home?  Regularly fix the edges of walkways and driveways and fix any cracks.  Remove anything that might make you trip as you walk through a door, such as a raised step or threshold.  Trim any bushes or trees on the path to your home.  Use bright outdoor lighting.  Clear any walking paths of anything that might make someone trip, such as rocks or tools.  Regularly check to see if handrails are loose or broken. Make sure that both sides of any steps have handrails.  Any raised decks and porches should have guardrails on the edges.  Have any leaves, snow, or ice cleared regularly.  Use sand or salt on walking paths during winter.  Clean up any spills in your garage right away. This includes oil or grease spills. What can I do in the bathroom?  Use night lights.  Install grab bars by the toilet and in the tub and shower. Do not use towel bars as grab bars.  Use non-skid mats or decals in the tub or shower.  If you need to sit down in  the shower, use a plastic, non-slip stool.  Keep the floor dry. Clean up any water that spills on the floor as soon as it happens.  Remove soap buildup in the tub or shower regularly.  Attach bath mats securely with double-sided non-slip rug tape.  Do not have throw rugs and other things on the floor that can make you trip. What can I do in the bedroom?  Use night lights.  Make sure that you have a light by your bed that is easy to reach.  Do not use any sheets or blankets that are too big for your bed. They should not hang down onto the floor.  Have a firm chair that has side arms. You can use this for support while you get dressed.  Do not have throw rugs and other things on the floor that can make you trip. What can I do in the kitchen?  Clean up any spills right away.  Avoid walking on wet floors.  Keep items that you use a lot in easy-to-reach places.  If you need to reach something above you, use a strong step stool that has a grab bar.  Keep electrical cords out of the way.  Do not use floor polish or wax that makes floors slippery. If you must use wax, use non-skid floor wax.  Do not have throw rugs and other things on the floor that can make you trip. What can I do with my stairs?  Do not leave any items on the stairs.  Make sure that there are handrails on both sides of the stairs and use them. Fix handrails that are broken or loose. Make sure that handrails are as long as the stairways.  Check any carpeting to make sure that it is firmly attached to the stairs. Fix any carpet that is loose or worn.  Avoid having throw rugs at the top or bottom of the stairs. If you do have throw rugs, attach them to the floor with carpet tape.  Make sure that you have a light switch at the top of the stairs and the bottom of the stairs. If you do not have them, ask someone to add them for you. What else can I do to help prevent falls?  Wear shoes that:  Do not have high  heels.  Have rubber bottoms.  Are comfortable and fit you well.  Are closed at the toe. Do not wear sandals.  If you use a stepladder:  Make sure that it is fully opened. Do not climb a closed stepladder.  Make sure that both sides of the stepladder are locked into place.  Ask someone to hold it for you, if possible.  Clearly mark and make sure that you can see:  Any grab bars or handrails.  First and last steps.  Where the edge of each step is.  Use tools that help you move around (mobility aids) if they are needed. These include:  Canes.  Walkers.  Scooters.  Crutches.  Turn on the lights when you go into a dark area. Replace any light bulbs as soon as they burn out.  Set up your furniture so you have a clear path. Avoid moving your furniture around.  If any of your floors are uneven, fix them.  If there are any pets around you, be aware of where they are.  Review your medicines with your doctor. Some medicines can make you feel dizzy. This can increase your chance of falling. Ask your doctor what other things that you can do to help prevent falls. This information is not intended to replace advice given to you by your health care provider. Make sure you discuss any questions you have with your health care provider. Document Released: 06/22/2009 Document Revised: 02/01/2016 Document Reviewed: 09/30/2014 Elsevier Interactive Patient Education  2017 [employer].

## 2020-03-30 ENCOUNTER — Telehealth: Payer: Self-pay | Admitting: Orthopaedic Surgery

## 2020-03-30 NOTE — Telephone Encounter (Signed)
Patient called.   She is requesting a letter stating that she has metal in her knee.   Call back: [phone]

## 2020-03-30 NOTE — Telephone Encounter (Signed)
Pt informed and stated understanding and will come pick it up tomorrow

## 2020-03-30 NOTE — Telephone Encounter (Signed)
Ok for letter to that effect

## 2020-03-30 NOTE — Telephone Encounter (Signed)
Letter placed up front  

## 2020-03-30 NOTE — Telephone Encounter (Signed)
Letter completed.

## 2020-03-30 NOTE — Telephone Encounter (Signed)
Dr. Durward Fortes do you have cards for this?

## 2020-05-23 ENCOUNTER — Other Ambulatory Visit: Payer: Self-pay | Admitting: Adult Health

## 2020-05-23 ENCOUNTER — Other Ambulatory Visit: Payer: Self-pay | Admitting: Physical Medicine & Rehabilitation

## 2020-05-23 DIAGNOSIS — J439 Emphysema, unspecified: Secondary | ICD-10-CM

## 2020-05-30 DIAGNOSIS — K432 Incisional hernia without obstruction or gangrene: Secondary | ICD-10-CM | POA: Diagnosis not present

## 2020-05-31 ENCOUNTER — Other Ambulatory Visit: Payer: Self-pay

## 2020-05-31 ENCOUNTER — Ambulatory Visit (INDEPENDENT_AMBULATORY_CARE_PROVIDER_SITE_OTHER): Payer: Medicare HMO | Admitting: Adult Health

## 2020-05-31 ENCOUNTER — Other Ambulatory Visit: Payer: Self-pay | Admitting: General Surgery

## 2020-05-31 ENCOUNTER — Encounter: Payer: Self-pay | Admitting: Adult Health

## 2020-05-31 VITALS — BP 128/92 | HR 92 | Wt 161.0 lb

## 2020-05-31 DIAGNOSIS — R05 Cough: Secondary | ICD-10-CM | POA: Diagnosis not present

## 2020-05-31 DIAGNOSIS — H6690 Otitis media, unspecified, unspecified ear: Secondary | ICD-10-CM | POA: Diagnosis not present

## 2020-05-31 DIAGNOSIS — K432 Incisional hernia without obstruction or gangrene: Secondary | ICD-10-CM

## 2020-05-31 DIAGNOSIS — R053 Chronic cough: Secondary | ICD-10-CM

## 2020-05-31 MED ORDER — HYDROCODONE-HOMATROPINE 5-1.5 MG/5ML PO SYRP: 5.0000 mL | ORAL_SOLUTION | Freq: Three times a day (TID) | ORAL | 0 refills | Status: DC | PRN

## 2020-05-31 MED ORDER — AMOXICILLIN-POT CLAVULANATE 875-125 MG PO TABS: 1.0000 | ORAL_TABLET | Freq: Two times a day (BID) | ORAL | 0 refills | Status: DC

## 2020-05-31 NOTE — Progress Notes (Signed)
Subjective:    Patient ID: Stacy Garcia, female    DOB: November 08, 1950, 69 y.o.   MRN: [id number]  HPI 69 year old female who  has a past medical history of AAA (abdominal aortic aneurysm) ([place]), Arthritis, COPD (chronic obstructive pulmonary disease) (HCC), Dyspnea, GERD (gastroesophageal reflux disease), Hyperlipidemia, Hypothyroidism, Thyroid disease, Tobacco abuse, and Vertigo.  She presents to the office today for an acute issue of left ear pain/head pain, the pain is located behind the left ear. Pain does not radiate. She denies pain in her temple or face. She has not experienced any photophobia or phonophobia. Pain has been ongoing for 5 days that is not relieved with over-the-counter medication. She denies drainage from the ear, jaw pain, tooth pain, fevers, or chills.  She also needs a refill of Hycodan cough syrup for coughing related to COPD   Review of Systems See HPI   Past Medical History:  Diagnosis Date   AAA (abdominal aortic aneurysm) (HCC)    Arthritis    COPD (chronic obstructive pulmonary disease) (HCC)    Dyspnea    just recently started wheezing   GERD (gastroesophageal reflux disease)    Hyperlipidemia    Hypothyroidism    Thyroid disease    Tobacco abuse    Vertigo     Social History   Socioeconomic History   Marital status: Married    Spouse name: Not on file   Number of children: 3   Years of education: Not on file   Highest education level: Not on file  Occupational History   Occupation: Designer/manufacturing    Comment: Librarian, academic; retired 2017   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  Tobacco Use   Smoking status: Former Smoker    Packs/day: 1.00    Years: 50.00    Pack years: 50.00    Types: Cigarettes    Quit date: 12/31/2016    Years since quitting: 3.4   Smokeless tobacco: Never Used  Vaping Use   Vaping Use: Never used  Substance and Sexual Activity   Alcohol use: No    Alcohol/week: 0.0  standard drinks   Drug use: No   Sexual activity: Not Currently  Other Topics Concern   Not on file  Social History Narrative   11/03/2018:   Lives with husband in 2 story home   Has 3 children, 7 grandchildren, 67 great-grandchildren, all of whom live close by   Takes care of two of her great grandchildren (47 yo and 77yo)   Horticulturist, commercial working, 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   Social Determinants of Health   Financial Resource Strain: Low Risk    Difficulty of Paying Living Expenses: Not hard at all  Food Insecurity: No Food Insecurity   Worried About Charity fundraiser in the Last Year: Never true   [place] in the Last Year: Never true  Transportation Needs: No Transportation Needs   Lack of Transportation (Medical): No   Lack of Transportation (Non-Medical): No  Physical Activity: Insufficiently Active   Days of Exercise per Week: 2 days   Minutes of Exercise per Session: 30 min  Stress: No Stress Concern Present   Feeling of Stress : Only a little  Social Connections: Engineer, building services of Communication with Friends and Family: More than three times a week   Frequency of Social Gatherings with Friends and Family: More than three times a week  Attends Religious Services: More than 4 times per year   Active Member of Clubs or Organizations: Yes   Attends Archivist Meetings: More than 4 times per year   Marital Status: Married  Human resources officer Violence: Not At Risk   Fear of Current or Ex-Partner: No   Emotionally Abused: No   Physically Abused: No   Sexually Abused: No    Past Surgical History:  Procedure Laterality Date   ABDOMINAL AORTIC ANEURYSM REPAIR N/A 02/05/2017   Procedure: ANEURYSM ABDOMINAL AORTIC REPAIR;  Surgeon: Elam Dutch, MD;  Location: [facility];  Service: Vascular;  Laterality: N/A;   CESAREAN SECTION     x 3   JOINT REPLACEMENT     SHOULDER  SURGERY  02/2015   TOTAL KNEE ARTHROPLASTY Right 01/23/2016   Procedure: TOTAL KNEE ARTHROPLASTY;  Surgeon: Garald Balding, MD;  Location: [facility];  Service: Orthopedics;  Laterality: Right;   TUBAL LIGATION      Family History  Problem Relation Age of Onset   Thyroid disease Other    Diabetes Other    Hyperlipidemia Other    Hypertension Other     Allergies  Allergen Reactions   Prednisone     "Swelling of face"    Current Outpatient Medications on File Prior to Visit  Medication Sig Dispense Refill   aspirin 325 MG EC tablet Take 325 mg by mouth daily.     atorvastatin (LIPITOR) 40 MG tablet TAKE 1 TABLET EVERY DAY 90 tablet 3   diazepam (VALIUM) 5 MG tablet TAKE 1/2 TO 1 TABLET BY MOUTH TWICE A DAY AS NEEDED 30 tablet 0   gabapentin (NEURONTIN) 100 MG capsule TAKE 1 CAPSULE BY MOUTH THREE TIMES A DAY 90 capsule 1   levothyroxine (SYNTHROID) 88 MCG tablet Take 1 tablet (88 mcg total) by mouth daily. 90 tablet 3   No current facility-administered medications on file prior to visit.    BP (!) 128/92 (BP Location: Left Arm, Patient Position: Sitting, Cuff Size: Normal)    Pulse 92    Wt 161 lb (73 kg)    SpO2 95%    BMI 27.64 kg/m       Objective:   Physical Exam Vitals and nursing note reviewed.  Constitutional:      Appearance: Normal appearance.  HENT:     Head: Normocephalic and atraumatic.      Right Ear: Tympanic membrane normal.     Left Ear: Tympanic membrane is scarred, erythematous and bulging.     Nose: Nose normal.     Mouth/Throat:     Mouth: Mucous membranes are moist.     Pharynx: Oropharynx is clear.  Eyes:     Extraocular Movements: Extraocular movements intact.     Pupils: Pupils are equal, round, and reactive to light.  Neurological:     Mental Status: She is alert.        Assessment & Plan:  1. Acute otitis media, unspecified otitis media type -Left TM erythematous and bulging. Likely developing inner ear infection which is  likely causing her discomfort. Will treat with Augmentin. Advise follow-up if no improvement over the next 2 to 3 days - amoxicillin-clavulanate (AUGMENTIN) 875-125 MG tablet; Take 1 tablet by mouth 2 (two) times daily.  Dispense: 20 tablet; Refill: 0  2. Chronic cough  - HYDROcodone-homatropine (HYCODAN) 5-1.5 MG/5ML syrup; Take 5 mLs by mouth every 8 (eight) hours as needed for cough.  Dispense: 120 mL; Refill: 0  [name],  NP

## 2020-06-13 ENCOUNTER — Other Ambulatory Visit: Payer: Self-pay

## 2020-06-13 ENCOUNTER — Ambulatory Visit
Admission: RE | Admit: 2020-06-13 | Discharge: 2020-06-13 | Disposition: A | Discharge status: Home / Self Care | Payer: Medicare HMO | Source: Ambulatory Visit | Attending: General Surgery | Admitting: General Surgery

## 2020-06-13 DIAGNOSIS — K432 Incisional hernia without obstruction or gangrene: Secondary | ICD-10-CM

## 2020-06-13 DIAGNOSIS — K439 Ventral hernia without obstruction or gangrene: Secondary | ICD-10-CM | POA: Diagnosis not present

## 2020-06-13 DIAGNOSIS — K469 Unspecified abdominal hernia without obstruction or gangrene: Secondary | ICD-10-CM | POA: Diagnosis not present

## 2020-06-13 DIAGNOSIS — I700 Atherosclerosis of aorta: Secondary | ICD-10-CM | POA: Diagnosis not present

## 2020-06-13 DIAGNOSIS — K429 Umbilical hernia without obstruction or gangrene: Secondary | ICD-10-CM | POA: Diagnosis not present

## 2020-06-15 ENCOUNTER — Telehealth: Payer: Self-pay | Admitting: Adult Health

## 2020-06-15 NOTE — Progress Notes (Signed)
  Chronic Care Management   Note  06/15/2020 Name: Stacy Garcia MRN: [id number] DOB: 1951/01/02  Stacy Garcia is a 69 y.o. year old female who is a primary care patient of [name], NP. I reached out to [name] by phone today in response to a referral sent by [name]'s PCP, [name], NP.   [name] was given information about Chronic Care Management services today including:  1. CCM service includes personalized support from designated clinical staff supervised by her physician, including individualized plan of care and coordination with other care providers 2. 24/7 contact phone numbers for assistance for urgent and routine care needs. 3. Service will only be billed when office clinical staff spend 20 minutes or more in a month to coordinate care. 4. Only one practitioner may furnish and bill the service in a calendar month. 5. The patient may stop CCM services at any time (effective at the end of the month) by phone call to the office staff.   Patient wishes to consider information provided and/or speak with a member of the care team before deciding about enrollment in care management services.   Follow up plan:   Carley Perdue [employer] Scheduler

## 2020-06-26 ENCOUNTER — Ambulatory Visit: Payer: Self-pay | Admitting: General Surgery

## 2020-06-26 DIAGNOSIS — K432 Incisional hernia without obstruction or gangrene: Secondary | ICD-10-CM | POA: Diagnosis not present

## 2020-06-26 NOTE — H&P (Signed)
History of Present Illness Ralene Ok MD; 06/26/2020 11:25 AM) The patient is a 69 year old female who presents with an incisional hernia. Patient is a 69 year old female who calls back today after CT scan. 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 He's also appeared to be above the level of the umbilicus.  Patient states that she would like to proceed with surgery at this point to help relieve the pain and discomfort she's having.   ------------------------------- Referred by: Dr. Juanda Crumble fields Chief Complaint: Incisional hernia  Patient is a 69 year old female who previously underwent an open aortic tube repair for AAA approximately 4 years ago. Patient states that approximately at surgery she noticed a hernia to the peri-umbilical area. Patient has had some increase in size as well as some pain. Patient states that she does have some discomfort in the area. She does feel that she like to have repaired secondary to be discomfort. Patient was a previous smoker and currently does not smoke. Patient's a previous C-section the past.    Allergies (Chanel Teressa Senter, CMA; 06/26/2020 10:57 AM) predniSONE *CORTICOSTEROIDS*  Allergies Reconciled   Medication History (Chanel Teressa Senter, CMA; 06/26/2020 10:57 AM) Atorvastatin Calcium (40MG  Tablet, Oral) Active. diazePAM (5MG  Tablet, Oral) Active. Gabapentin (100MG  Capsule, Oral) Active. Levothyroxine Sodium (88MCG Tablet, Oral) Active. Medications Reconciled    Review of Systems Ralene Ok, MD; 06/26/2020 11:26 AM) Skin Not Present- Change in Wart/Mole, Dryness, Hives, Jaundice, New Lesions, Non-Healing Wounds, Rash and Ulcer. HEENT Not Present- Earache, Hearing Loss, Hoarseness, Nose Bleed, Oral Ulcers, Ringing in the Ears, Seasonal Allergies, Sinus Pain, Sore Throat, Visual Disturbances, Wears glasses/contact lenses and Yellow Eyes. Breast Not Present- Breast Mass, Breast  Pain, Nipple Discharge and Skin Changes. Female Genitourinary Not Present- Frequency, Nocturia, Painful Urination, Pelvic Pain and Urgency. Psychiatric Not Present- Anxiety, Bipolar, Change in Sleep Pattern, Depression, Fearful and Frequent crying. Endocrine Present- Hot flashes. Not Present- Cold Intolerance, Excessive Hunger, Hair Changes, Heat Intolerance and New Diabetes. All other systems negative  Vitals (Chanel Nolan CMA; 06/26/2020 10:57 AM) 06/26/2020 10:57 AM Weight: 163.13 lb Height: 65in Body Surface Area: 1.81 m Body Mass Index: 27.15 kg/m  Temp.: 97.23F  Pulse: 106 (Regular)  BP: 130/74(Sitting, Left Arm, Standard)       Physical Exam Ralene Ok MD; 06/26/2020 11:25 AM) The physical exam findings are as follows: Note: Constitutional: No acute distress, conversant, appears stated age  Eyes: Anicteric sclerae, moist conjunctiva, no lid lag  Neck: No thyromegaly, trachea midline, no cervical lymphadenopathy  Lungs: Clear to auscultation biilaterally, normal respiratory effot  Cardiovascular: regular rate & rhythm, no murmurs, no peripheal edema, pedal pulses 2+  GI: Soft, no masses or hepatosplenomegaly, non-tender to palpation  MSK: Normal gait, no clubbing cyanosis, edema  Skin: No rashes, palpation reveals normal skin turgor  Psychiatric: Appropriate judgment and insight, oriented to person, place, and time  Abdomen Inspection Hernias - Incisional - Reducible (Left periumbilical Incisional hernia) . Note: Constitutional: No acute distress, conversant, appears stated age  Eyes: Anicteric sclerae, moist conjunctiva, no lid lag  Neck: No thyromegaly, trachea midline, no cervical lymphadenopathy  Lungs: Clear to auscultation biilaterally, normal respiratory effot  Cardiovascular: regular rate & rhythm, no murmurs, no peripheal edema, pedal pulses 2+  GI: Soft, no masses or hepatosplenomegaly, non-tender to palpation  MSK: Normal  gait, no clubbing cyanosis, edema  Skin: No rashes, palpation reveals normal skin turgor  Psychiatric: Appropriate judgment and insight, oriented to person, place, and  time     Assessment & Plan Ralene Ok MD; 06/26/2020 11:25 AM) Fatima Blank HERNIA, WITHOUT OBSTRUCTION OR GANGRENE (K43.2) Impression: Patient is a 69 year old female with his cheek defect incisional hernia. 1. Patient with excellent candidate for robotic retrorectus incisional hernia repair with mesh. 2. All risks and benefits were discussed with the patient to generally include, but not limited to: infection, bleeding, damage to surrounding structures, acute and chronic nerve pain, and recurrence. Alternatives were offered and described. All questions were answered and the patient voiced understanding of the procedure and wishes to proceed at this point with hernia repair.

## 2020-07-07 ENCOUNTER — Other Ambulatory Visit: Payer: Self-pay | Admitting: Family Medicine

## 2020-07-07 DIAGNOSIS — J439 Emphysema, unspecified: Secondary | ICD-10-CM

## 2020-07-07 NOTE — Telephone Encounter (Signed)
HYDROcodone-homatropine (HYCODAN) 5-1.5 MG/5ML syrup    [id number] [place], [place] - [address] AT [place] Phone:  [phone]  Fax:  [phone]

## 2020-07-19 ENCOUNTER — Other Ambulatory Visit: Payer: Self-pay | Admitting: Adult Health

## 2020-08-24 DIAGNOSIS — Z1231 Encounter for screening mammogram for malignant neoplasm of breast: Secondary | ICD-10-CM | POA: Diagnosis not present

## 2020-09-04 NOTE — Progress Notes (Signed)
Your procedure is scheduled on Wednesday, September 13, 2020.  Report to [facility] Main Entrance "A" at 6:30 A.M., and check in at the Admitting office.  Call this number if you have problems the morning of surgery:  [phone]  Call [phone] if you have any questions prior to your surgery date Monday-Friday 8am-4pm    Remember:  Do not eat after midnight the night before your surgery  You may drink clear liquids until 5:30 AM the morning of your surgery.   Clear liquids allowed are: Water, Non-Citrus Juices (without pulp), Carbonated Beverages, Clear Tea, Black Coffee Only, and Gatorade  Please complete your PRE-SURGERY ENSURE that was provided to you by 5:30 AM the morning of surgery.  Please, if able, drink it in one setting. DO NOT SIP.     Take these medicines the morning of surgery with A SIP OF WATER:  atorvastatin (LIPITOR) levothyroxine (SYNTHROID) diazepam (VALIUM) - if needed  Follow your surgeon's instructions on when to stop Aspirin.  If no instructions were given by your surgeon then you will need to call the office to get those instructions.    As of today, STOP taking any Aleve, Naproxen, Ibuprofen, Motrin, Advil, Goody's, BC's, all herbal medications, fish oil, and all vitamins.                      Do not wear jewelry, make up, or nail polish            Do not wear lotions, powders, perfumes, or deodorant.            Do not shave 48 hours prior to surgery.            Do not bring valuables to the hospital.            [facility] is not responsible for any belongings or valuables.  Do NOT Smoke (Tobacco/Vaping) or drink Alcohol 24 hours prior to your procedure If you use a CPAP at night, you may bring all equipment for your overnight stay.   Contacts, glasses, dentures or bridgework may not be worn into surgery.      For patients admitted to the hospital, discharge time will be determined by your treatment team.   Patients discharged the day of surgery  will not be allowed to drive home, and someone needs to stay with them for 24 hours.    Special instructions:   [facility]- Preparing For Surgery  Before surgery, you can play an important role. Because skin is not sterile, your skin needs to be as free of germs as possible. You can reduce the number of germs on your skin by washing with CHG (chlorahexidine gluconate) Soap before surgery.  CHG is an antiseptic cleaner which kills germs and bonds with the skin to continue killing germs even after washing.    Oral Hygiene is also important to reduce your risk of infection.  Remember - BRUSH YOUR TEETH THE MORNING OF SURGERY WITH YOUR REGULAR TOOTHPASTE  Please do not use if you have an allergy to CHG or antibacterial soaps. If your skin becomes reddened/irritated stop using the CHG.  Do not shave (including legs and underarms) for at least 48 hours prior to first CHG shower. It is OK to shave your face.  Please follow these instructions carefully.   1. Shower the NIGHT BEFORE SURGERY and the MORNING OF SURGERY with CHG Soap.   2. If you chose to wash your hair, wash your  hair first as usual with your normal shampoo.  3. After you shampoo, rinse your hair and body thoroughly to remove the shampoo.  4. Use CHG as you would any other liquid soap. You can apply CHG directly to the skin and wash gently with a scrungie or a clean washcloth.   5. Apply the CHG Soap to your body ONLY FROM THE NECK DOWN.  Do not use on open wounds or open sores. Avoid contact with your eyes, ears, mouth and genitals (private parts). Wash Face and genitals (private parts)  with your normal soap.   6. Wash thoroughly, paying special attention to the area where your surgery will be performed.  7. Thoroughly rinse your body with warm water from the neck down.  8. DO NOT shower/wash with your normal soap after using and rinsing off the CHG Soap.  9. Pat yourself dry with a CLEAN TOWEL.  10. Wear CLEAN PAJAMAS to  bed the night before surgery  11. Place CLEAN SHEETS on your bed the night of your first shower and DO NOT SLEEP WITH PETS.   Day of Surgery: Wear Clean/Comfortable clothing the morning of surgery Do not apply any deodorants/lotions.   Remember to brush your teeth WITH YOUR REGULAR TOOTHPASTE.   Please read over the following fact sheets that you were given.

## 2020-09-05 ENCOUNTER — Encounter (HOSPITAL_COMMUNITY): Payer: Self-pay

## 2020-09-05 ENCOUNTER — Encounter (HOSPITAL_COMMUNITY)
Admission: RE | Admit: 2020-09-05 | Discharge: 2020-09-05 | Disposition: A | Discharge status: Home / Self Care | Payer: Medicare HMO | Source: Ambulatory Visit | Attending: General Surgery | Admitting: General Surgery

## 2020-09-05 ENCOUNTER — Other Ambulatory Visit: Payer: Self-pay

## 2020-09-05 DIAGNOSIS — J449 Chronic obstructive pulmonary disease, unspecified: Secondary | ICD-10-CM | POA: Diagnosis not present

## 2020-09-05 DIAGNOSIS — E039 Hypothyroidism, unspecified: Secondary | ICD-10-CM | POA: Diagnosis not present

## 2020-09-05 DIAGNOSIS — Z7982 Long term (current) use of aspirin: Secondary | ICD-10-CM | POA: Insufficient documentation

## 2020-09-05 DIAGNOSIS — Z9981 Dependence on supplemental oxygen: Secondary | ICD-10-CM | POA: Diagnosis not present

## 2020-09-05 DIAGNOSIS — I714 Abdominal aortic aneurysm, without rupture: Secondary | ICD-10-CM | POA: Insufficient documentation

## 2020-09-05 DIAGNOSIS — Z87891 Personal history of nicotine dependence: Secondary | ICD-10-CM | POA: Diagnosis not present

## 2020-09-05 DIAGNOSIS — E785 Hyperlipidemia, unspecified: Secondary | ICD-10-CM | POA: Diagnosis not present

## 2020-09-05 DIAGNOSIS — K432 Incisional hernia without obstruction or gangrene: Secondary | ICD-10-CM | POA: Insufficient documentation

## 2020-09-05 DIAGNOSIS — Z79899 Other long term (current) drug therapy: Secondary | ICD-10-CM | POA: Insufficient documentation

## 2020-09-05 DIAGNOSIS — Z01812 Encounter for preprocedural laboratory examination: Secondary | ICD-10-CM | POA: Diagnosis not present

## 2020-09-05 LAB — BASIC METABOLIC PANEL
Anion gap: 9 (ref 5–15)
BUN: 10 mg/dL (ref 8–23)
CO2: 27 mmol/L (ref 22–32)
Calcium: 9.4 mg/dL (ref 8.9–10.3)
Chloride: 104 mmol/L (ref 98–111)
Creatinine, Ser: 0.88 mg/dL (ref 0.44–1.00)
GFR, Estimated: >60 mL/min (ref >60)
Glucose, Bld: 88 mg/dL (ref 70–99)
Potassium: 4 mmol/L (ref 3.5–5.1)
Sodium: 140 mmol/L (ref 135–145)

## 2020-09-05 LAB — CBC
HCT: 52.1 % — ABNORMAL HIGH (ref 36.0–46.0)
Hemoglobin: 16.8 g/dL — ABNORMAL HIGH (ref 12.0–15.0)
MCH: 30.5 pg (ref 26.0–34.0)
MCHC: 32.2 g/dL (ref 30.0–36.0)
MCV: 94.6 fL (ref 80.0–100.0)
Platelets: 238 10*3/uL (ref 150–400)
RBC: 5.51 MIL/uL — ABNORMAL HIGH (ref 3.87–5.11)
RDW: 13.2 % (ref 11.5–15.5)
WBC: 7.4 10*3/uL (ref 4.0–10.5)
nRBC: 0 % (ref 0.0–0.2)

## 2020-09-05 NOTE — Anesthesia Preprocedure Evaluation (Addendum)
Anesthesia Evaluation  Patient identified by MRN, date of birth, ID band Patient awake    Reviewed: Allergy & Precautions, NPO status , Patient's Chart, lab work & pertinent test results  Airway Mallampati: II  TM Distance: >3 FB Neck ROM: Full    Dental  (+) Edentulous Lower, Edentulous Upper   Pulmonary COPD, former smoker,    breath sounds clear to auscultation       Cardiovascular + Peripheral Vascular Disease (s/p AAA repair)   Rhythm:Regular Rate:Normal     Neuro/Psych  Headaches,  Neuromuscular disease    GI/Hepatic Neg liver ROS, PUD, GERD  ,  Endo/Other  Hypothyroidism   Renal/GU negative Renal ROS     Musculoskeletal  (+) Arthritis ,   Abdominal   Peds  Hematology negative hematology ROS (+)   Anesthesia Other Findings   Reproductive/Obstetrics                            Lab Results  Component Value Date   WBC 7.4 09/05/2020   HGB 16.8 (H) 09/05/2020   HCT 52.1 (H) 09/05/2020   MCV 94.6 09/05/2020   PLT 238 09/05/2020   Lab Results  Component Value Date   CREATININE 0.88 09/05/2020   BUN 10 09/05/2020   NA 140 09/05/2020   K 4.0 09/05/2020   CL 104 09/05/2020   CO2 27 09/05/2020    Anesthesia Physical Anesthesia Plan  ASA: III  Anesthesia Plan: General   Post-op Pain Management:    Induction: Intravenous  PONV Risk Score and Plan: 3 and Dexamethasone, Ondansetron and Treatment may vary due to age or medical condition  Airway Management Planned: Oral ETT  Additional Equipment:   Intra-op Plan:   Post-operative Plan: Extubation in OR  Informed Consent: I have reviewed the patients History and Physical, chart, labs and discussed the procedure including the risks, benefits and alternatives for the proposed anesthesia with the patient or authorized representative who has indicated his/her understanding and acceptance.     Dental advisory given  Plan  Discussed with:   Anesthesia Plan Comments: (PAT note written 09/05/2020 by Shonna Chock, PA-C. S/p open AAA repair in 2018. COPD, quit smoking in 2018. Last EKG 2018.  )     Anesthesia Quick Evaluation

## 2020-09-05 NOTE — Progress Notes (Signed)
PCP - Enid Cutter, NP Cardiologist - denies Vascular - Dr. Fabienne Bruns Pulmonologist - Dr. Coralyn Helling  PPM/ICD -  denies  Chest x-ray - N/A EKG - N/A Stress Test - 01/14/17  ECHO - denies Cardiac Cath - denies  Sleep Study - denies CPAP - N/A  DM: denies  Blood Thinner Instructions: N/A Aspirin Instructions: Patient instructed to call surgeon's office today to get instructions on when to stop taking aspirin for surgery  ERAS Protcol - Yes PRE-SURGERY Ensure or G2- Ensure given  COVID TEST- Scheduled for 09/11/20  Anesthesia review: YES, AAA hx, COPD hx  Patient denies shortness of breath, fever, cough and chest pain at PAT appointment  All instructions explained to the patient, with a verbal understanding of the material. Patient agrees to go over the instructions while at home for a better understanding. Patient also instructed to self quarantine after being tested for COVID-19. The opportunity to ask questions was provided.

## 2020-09-05 NOTE — Progress Notes (Signed)
Anesthesia Chart Review:  Case: [id number] Date/Time: 09/13/20 0815   Procedure: ROBOTIC INCISIONAL HERNIA REPAIR WITH MESH (N/A )   Anesthesia type: General   Pre-op diagnosis: INCISIONAL HERNIA   [facility] ROOM 10 / [facility] OR   Surgeons: Axel Filler, MD      DISCUSSION: Patient is a 69 year old female scheduled for the above procedure. She is s/p open AAA repair in 2018. 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   Non-ischemic stress test in 2018, but could not rule out prior inferoseptal MI. She went on to tolerate open AAA repair and was extubated in the OR after surgery. She quit smoking for this procedure and has remained a non-smoker since. She last saw pulmonologist Dr. Craige Cotta in 09/2019 and was not on "goal" therapy, but for various reasons she would or could not take COPD medications such as Bevespi, Trelegy, or Incruse. She never tried Spiriva as she did not want to be on a routine inhaler. She has a rescue inhaler that she rarely uses. She is no on home O2 and has not had any recent COPD exacerbations. She admits that following AAA repair, she never quite felt like her breathing was back to baseline. She noticed that she may may have dyspnea with prolonged talking or carrying items while going up stairs--but says this has not progressed since then. She is still able to sweep, mop, cook, do laundry, climb 7-14 stairs in her home, but will may have to rest in between activities, which may be due to dyspnea and/or back pain. She denied chest pain, SOB at rest, syncope, presyncope, palpitations, edema. She sleeps on 1-2 pillows, otherwise may experience vertigo. She will occasionally have a dry coughing spell, last about 2 weeks ago. Her hernia  become large during these spells. She denied dysphagia. She doesn't really notice any wheezing.   Reviewed with anesthesiologist Autumn Patty, MD. Patient reports stable DOE for at least three years and without CV symptoms or recent COPD exacerbations. She is not taking any long acting COPD medication by her choice. No longer smoking. Home O2 use.  Anesthesia team to evaluate on the day of surgery.  Preoperative COVID-19 test is scheduled for 09/11/2020.   VS: BP 133/77   Pulse 71   Temp 36.7 C (Oral)   Resp 18   Ht 5\' 5"  (1.651 m)   Wt 71.3 kg   SpO2 98%   BMI 26.16 kg/m    PROVIDERS: , NP is PCP - Shirline Frees, MD is vascular surgeon. Last visit 12/23/19. Small abdominal hernia noted at that time. One year follow-up recommended, but should her hernia become bothersome then would refer to general surgery in the interim.  12/25/19, MD is pulmonologist. Last visit 09/20/19. No improvement with Bevespi, Trelegy too expensive, and didn't like the taste of Incruse. Recommended trial of Spiriva once Incruse inhaler completed. Six month follow-up advised.      LABS: Labs reviewed: Acceptable for surgery. (all labs ordered are listed, but only abnormal results are displayed)  Labs Reviewed  CBC - Abnormal; Notable for the following components:      Result Value   RBC 5.51 (*)    Hemoglobin 16.8 (*)    HCT 52.1 (*)    All other  components within normal limits  BASIC METABOLIC PANEL    Spirometry 12/10/16: FVC 1.5 (48%). FEV1 1.2  (50%). FEV1/FVC 80% (104%). FEF 25-75% 1.2 (59%). Severe restriction. (Done as part of preoperative pulmonology evaluation prior to AAA repair. Moderate COPD Gold 2. Felt at high pulmonary risk, but not prohibitive risk, for AAA repair. She was extubated in OR following AAA repair.)  PFTs 10/23/15: FVC 1.82 (56%), post 1.81 (56%). FEV1 1.12 (45%), post 1.25 (50%), FEV1/FVC  62% (79%), post 69%. TLC 4.54 (87%). DLCO unc 13.49 (52%), cor  13.45 (52%). Patient referred for Pulmonary Consult.    IMAGES: CT Abd/pelvis 06/13/20: IMPRESSION: 1. Left periumbilical hernia containing fat which is enlarged compared with 11/25/2017 with mild inflammatory changes on either side of the abdominal wall defect. 2. Small fat containing supraumbilical hernia. 3. Hepatic steatosis. 4. Aortic Atherosclerosis (ICD10-I70.0). Abdominal aortic aneurysm repair with the abdominal aorta measuring 3 cm.   EKG: Last EKG seen was from 02/06/17 and showed ST at 121 bpm (post AAA repair).    CV: Abdominal Aorta US 12/23/19: Summary:  Abdominal Aorta: Patent abdominal aorta repair without evidence of  aneurysmal dilatation.  Technically difficult exam due to overlying bowel gas, measurements are  based on limited visualization.     Nuclear stress test 01/14/17 (ordered by Dr. Darrick Penna prior to AAA repair):  Nuclear stress EF: 68%. The left ventricular ejection fraction is hyperdynamic (>65%). The mid and basal inferoseptal wall is moderately hypokinetic.  Defect 1: There is a small nonreversible defect of moderate severity present in the basal inferoseptal and mid inferoseptal location. This could be due to a previous inferoseptal MI. There is no ischemia .  This is a low risk study.     Past Medical History:  Diagnosis Date  . AAA (abdominal aortic aneurysm) (HCC)   . Arthritis   . COPD (chronic obstructive pulmonary disease) (HCC)   . Dyspnea    just recently started wheezing  . GERD (gastroesophageal reflux disease)   . Hyperlipidemia   . Hypothyroidism   . Thyroid disease   . Tobacco abuse   . Vertigo     Past Surgical History:  Procedure Laterality Date  . ABDOMINAL AORTIC ANEURYSM REPAIR N/A 02/05/2017   Procedure: ANEURYSM ABDOMINAL AORTIC REPAIR;  Surgeon: Sherren Kerns, MD;  Location: [facility] OR;  Service: Vascular;  Laterality: N/A;  . CESAREAN SECTION     x 3  . JOINT REPLACEMENT    . SHOULDER SURGERY  02/2015  . TOTAL  KNEE ARTHROPLASTY Right 01/23/2016   Procedure: TOTAL KNEE ARTHROPLASTY;  Surgeon: Valeria Batman, MD;  Location: [facility] OR;  Service: Orthopedics;  Laterality: Right;  . TUBAL LIGATION      MEDICATIONS: . amoxicillin-clavulanate (AUGMENTIN) 875-125 MG tablet  . aspirin 325 MG EC tablet  . atorvastatin (LIPITOR) 40 MG tablet  . diazepam (VALIUM) 5 MG tablet  . gabapentin (NEURONTIN) 100 MG capsule  . HYDROcodone-homatropine (HYCODAN) 5-1.5 MG/5ML syrup  . ibuprofen (ADVIL) 200 MG tablet  . levothyroxine (SYNTHROID) 88 MCG tablet   No current facility-administered medications for this encounter.  Patient not currently taking Augmentin, Neurontin, Hycodan and will clarify wth surgeon regarding perioperative ASA instructions.    Shonna Chock, PA-C Surgical Short Stay/Anesthesiology [facility] Phone [phone] [facility] Phone [phone] 09/05/2020 4:39 PM

## 2020-09-11 ENCOUNTER — Other Ambulatory Visit (HOSPITAL_COMMUNITY)
Admission: RE | Admit: 2020-09-11 | Discharge: 2020-09-11 | Disposition: A | Discharge status: Home / Self Care | Payer: Medicare HMO | Source: Ambulatory Visit | Attending: General Surgery | Admitting: General Surgery

## 2020-09-11 DIAGNOSIS — Z01812 Encounter for preprocedural laboratory examination: Secondary | ICD-10-CM | POA: Diagnosis not present

## 2020-09-11 DIAGNOSIS — Z20822 Contact with and (suspected) exposure to covid-19: Secondary | ICD-10-CM | POA: Insufficient documentation

## 2020-09-11 LAB — SARS CORONAVIRUS 2 (TAT 6-24 HRS): SARS Coronavirus 2: NEGATIVE

## 2020-09-13 ENCOUNTER — Observation Stay (HOSPITAL_COMMUNITY)
Admission: RE | Admit: 2020-09-13 | Discharge: 2020-09-14 | Disposition: A | Discharge status: Home / Self Care | Payer: Medicare HMO | Source: Ambulatory Visit | Attending: General Surgery | Admitting: General Surgery

## 2020-09-13 ENCOUNTER — Ambulatory Visit (HOSPITAL_COMMUNITY): Payer: Medicare HMO | Admitting: Certified Registered"

## 2020-09-13 ENCOUNTER — Other Ambulatory Visit: Payer: Self-pay

## 2020-09-13 ENCOUNTER — Ambulatory Visit (HOSPITAL_COMMUNITY): Payer: Medicare HMO | Admitting: Vascular Surgery

## 2020-09-13 ENCOUNTER — Encounter (HOSPITAL_COMMUNITY): Payer: Self-pay | Admitting: General Surgery

## 2020-09-13 ENCOUNTER — Encounter (HOSPITAL_COMMUNITY)
Admission: RE | Disposition: A | Discharge status: Home / Self Care | Payer: Self-pay | Source: Ambulatory Visit | Attending: General Surgery

## 2020-09-13 DIAGNOSIS — J441 Chronic obstructive pulmonary disease with (acute) exacerbation: Secondary | ICD-10-CM | POA: Diagnosis not present

## 2020-09-13 DIAGNOSIS — K219 Gastro-esophageal reflux disease without esophagitis: Secondary | ICD-10-CM | POA: Diagnosis not present

## 2020-09-13 DIAGNOSIS — Z8719 Personal history of other diseases of the digestive system: Secondary | ICD-10-CM

## 2020-09-13 DIAGNOSIS — K432 Incisional hernia without obstruction or gangrene: Secondary | ICD-10-CM | POA: Diagnosis not present

## 2020-09-13 DIAGNOSIS — E039 Hypothyroidism, unspecified: Secondary | ICD-10-CM | POA: Diagnosis not present

## 2020-09-13 DIAGNOSIS — Z9889 Other specified postprocedural states: Secondary | ICD-10-CM

## 2020-09-13 HISTORY — PX: XI ROBOTIC ASSISTED VENTRAL HERNIA: SHX6789

## 2020-09-13 HISTORY — PX: OTHER SURGICAL HISTORY: SHX169

## 2020-09-13 HISTORY — PX: INSERTION OF MESH: SHX5868

## 2020-09-13 SURGERY — REPAIR, HERNIA, VENTRAL, ROBOT-ASSISTED
Anesthesia: General | Site: Abdomen

## 2020-09-13 MED ORDER — KETOROLAC TROMETHAMINE 15 MG/ML IJ SOLN
INTRAMUSCULAR | Status: AC
  Filled 2020-09-13: qty 1

## 2020-09-13 MED ORDER — KETOROLAC TROMETHAMINE 15 MG/ML IJ SOLN
15.0000 mg | Freq: Four times a day (QID) | INTRAMUSCULAR | Status: DC | PRN
  Administered 2020-09-13 (×2): 15 mg via INTRAVENOUS
  Filled 2020-09-13: qty 1

## 2020-09-13 MED ORDER — 0.9 % SODIUM CHLORIDE (POUR BTL) OPTIME
TOPICAL | Status: DC | PRN
  Administered 2020-09-13: 1000 mL

## 2020-09-13 MED ORDER — BUPIVACAINE LIPOSOME 1.3 % IJ SUSP
20.0000 mL | INTRAMUSCULAR | Status: DC
  Filled 2020-09-13: qty 20

## 2020-09-13 MED ORDER — FENTANYL CITRATE (PF) 250 MCG/5ML IJ SOLN
INTRAMUSCULAR | Status: DC | PRN
  Administered 2020-09-13 (×3): 50 ug via INTRAVENOUS
  Administered 2020-09-13: 100 ug via INTRAVENOUS

## 2020-09-13 MED ORDER — ORAL CARE MOUTH RINSE: 15.0000 mL | Freq: Once | OROMUCOSAL | Status: AC

## 2020-09-13 MED ORDER — FENTANYL CITRATE (PF) 100 MCG/2ML IJ SOLN
25.0000 ug | INTRAMUSCULAR | Status: DC | PRN
  Administered 2020-09-13 (×3): 50 ug via INTRAVENOUS

## 2020-09-13 MED ORDER — ROCURONIUM BROMIDE 10 MG/ML (PF) SYRINGE
PREFILLED_SYRINGE | INTRAVENOUS | Status: DC | PRN
  Administered 2020-09-13: 40 mg via INTRAVENOUS
  Administered 2020-09-13 (×2): 30 mg via INTRAVENOUS

## 2020-09-13 MED ORDER — CHLORHEXIDINE GLUCONATE 0.12 % MT SOLN
15.0000 mL | Freq: Once | OROMUCOSAL | Status: AC
  Administered 2020-09-13: 15 mL via OROMUCOSAL
  Filled 2020-09-13: qty 15

## 2020-09-13 MED ORDER — CHLORHEXIDINE GLUCONATE CLOTH 2 % EX PADS: 6.0000 | MEDICATED_PAD | Freq: Once | CUTANEOUS | Status: DC

## 2020-09-13 MED ORDER — LIDOCAINE 2% (20 MG/ML) 5 ML SYRINGE
INTRAMUSCULAR | Status: DC | PRN
  Administered 2020-09-13: 100 mg via INTRAVENOUS

## 2020-09-13 MED ORDER — MIDAZOLAM HCL 2 MG/2ML IJ SOLN
INTRAMUSCULAR | Status: AC
  Filled 2020-09-13: qty 2

## 2020-09-13 MED ORDER — GABAPENTIN 300 MG PO CAPS
300.0000 mg | ORAL_CAPSULE | Freq: Two times a day (BID) | ORAL | Status: DC
  Administered 2020-09-13 (×2): 300 mg via ORAL
  Filled 2020-09-13 (×2): qty 1

## 2020-09-13 MED ORDER — IBUPROFEN 400 MG PO TABS: 400.0000 mg | ORAL_TABLET | Freq: Four times a day (QID) | ORAL | Status: DC | PRN

## 2020-09-13 MED ORDER — FENTANYL CITRATE (PF) 250 MCG/5ML IJ SOLN
INTRAMUSCULAR | Status: AC
  Filled 2020-09-13: qty 5

## 2020-09-13 MED ORDER — ENSURE PRE-SURGERY PO LIQD: 592.0000 mL | Freq: Once | ORAL | Status: DC

## 2020-09-13 MED ORDER — SUGAMMADEX SODIUM 200 MG/2ML IV SOLN
INTRAVENOUS | Status: DC | PRN
  Administered 2020-09-13: 200 mg via INTRAVENOUS

## 2020-09-13 MED ORDER — LACTATED RINGERS IV SOLN: INTRAVENOUS | Status: DC

## 2020-09-13 MED ORDER — ONDANSETRON HCL 4 MG/2ML IJ SOLN
INTRAMUSCULAR | Status: DC | PRN
  Administered 2020-09-13: 4 mg via INTRAVENOUS

## 2020-09-13 MED ORDER — OXYCODONE HCL 5 MG PO TABS
5.0000 mg | ORAL_TABLET | ORAL | Status: DC | PRN
  Administered 2020-09-13 – 2020-09-14 (×4): 10 mg via ORAL
  Filled 2020-09-13 (×4): qty 2

## 2020-09-13 MED ORDER — GABAPENTIN 300 MG PO CAPS
300.0000 mg | ORAL_CAPSULE | ORAL | Status: AC
  Administered 2020-09-13: 300 mg via ORAL
  Filled 2020-09-13: qty 1

## 2020-09-13 MED ORDER — MIDAZOLAM HCL 5 MG/5ML IJ SOLN
INTRAMUSCULAR | Status: DC | PRN
  Administered 2020-09-13: 2 mg via INTRAVENOUS

## 2020-09-13 MED ORDER — PROPOFOL 10 MG/ML IV BOLUS
INTRAVENOUS | Status: DC | PRN
  Administered 2020-09-13: 110 mg via INTRAVENOUS

## 2020-09-13 MED ORDER — ONDANSETRON 4 MG PO TBDP: 4.0000 mg | ORAL_TABLET | Freq: Four times a day (QID) | ORAL | Status: DC | PRN

## 2020-09-13 MED ORDER — FENTANYL CITRATE (PF) 100 MCG/2ML IJ SOLN
INTRAMUSCULAR | Status: AC
  Filled 2020-09-13: qty 2

## 2020-09-13 MED ORDER — ONDANSETRON HCL 4 MG/2ML IJ SOLN: 4.0000 mg | Freq: Four times a day (QID) | INTRAMUSCULAR | Status: DC | PRN

## 2020-09-13 MED ORDER — LEVOTHYROXINE SODIUM 88 MCG PO TABS
88.0000 ug | ORAL_TABLET | Freq: Every day | ORAL | Status: DC
  Administered 2020-09-14: 88 ug via ORAL
  Filled 2020-09-13: qty 1

## 2020-09-13 MED ORDER — PHENYLEPHRINE HCL-NACL 10-0.9 MG/250ML-% IV SOLN
INTRAVENOUS | Status: DC | PRN
  Administered 2020-09-13: 20 ug/min via INTRAVENOUS

## 2020-09-13 MED ORDER — AMISULPRIDE (ANTIEMETIC) 5 MG/2ML IV SOLN: 10.0000 mg | Freq: Once | INTRAVENOUS | Status: DC | PRN

## 2020-09-13 MED ORDER — ENSURE PRE-SURGERY PO LIQD: 296.0000 mL | Freq: Once | ORAL | Status: DC

## 2020-09-13 MED ORDER — DEXTROSE-NACL 5-0.9 % IV SOLN: INTRAVENOUS | Status: DC

## 2020-09-13 MED ORDER — PROPOFOL 10 MG/ML IV BOLUS
INTRAVENOUS | Status: AC
  Filled 2020-09-13: qty 40

## 2020-09-13 MED ORDER — ACETAMINOPHEN 500 MG PO TABS
1000.0000 mg | ORAL_TABLET | ORAL | Status: AC
  Administered 2020-09-13: 1000 mg via ORAL
  Filled 2020-09-13: qty 2

## 2020-09-13 MED ORDER — DIAZEPAM 5 MG PO TABS: 5.0000 mg | ORAL_TABLET | Freq: Two times a day (BID) | ORAL | Status: DC | PRN

## 2020-09-13 MED ORDER — CEFAZOLIN SODIUM-DEXTROSE 2-4 GM/100ML-% IV SOLN
2.0000 g | INTRAVENOUS | Status: AC
  Administered 2020-09-13: 2 g via INTRAVENOUS
  Filled 2020-09-13: qty 100

## 2020-09-13 MED ORDER — BUPIVACAINE HCL 0.25 % IJ SOLN
INTRAMUSCULAR | Status: DC | PRN
  Administered 2020-09-13: 9 mL

## 2020-09-13 MED ORDER — SODIUM CHLORIDE 0.9 % IV SOLN
INTRAVENOUS | Status: DC | PRN
  Administered 2020-09-13: 40 mL

## 2020-09-13 MED ORDER — BUPIVACAINE HCL (PF) 0.25 % IJ SOLN
INTRAMUSCULAR | Status: AC
  Filled 2020-09-13: qty 60

## 2020-09-13 SURGICAL SUPPLY — 58 items
APPLICATOR VISTASEAL 35 (MISCELLANEOUS) IMPLANT
CANNULA REDUC XI 12-8 STAPL (CANNULA) ×2
CANNULA REDUCER 12-8 DVNC XI (CANNULA) ×1 IMPLANT
CHLORAPREP W/TINT 26 (MISCELLANEOUS) ×2 IMPLANT
COVER MAYO STAND STRL (DRAPES) ×2 IMPLANT
COVER SURGICAL LIGHT HANDLE (MISCELLANEOUS) ×2 IMPLANT
COVER TIP SHEARS 8 DVNC (MISCELLANEOUS) ×1 IMPLANT
COVER TIP SHEARS 8MM DA VINCI (MISCELLANEOUS) ×2
COVER WAND RF STERILE (DRAPES) IMPLANT
DECANTER SPIKE VIAL GLASS SM (MISCELLANEOUS) ×2 IMPLANT
DEFOGGER SCOPE WARMER CLEARIFY (MISCELLANEOUS) ×2 IMPLANT
DERMABOND ADVANCED (GAUZE/BANDAGES/DRESSINGS) ×1
DERMABOND ADVANCED .7 DNX12 (GAUZE/BANDAGES/DRESSINGS) ×1 IMPLANT
DEVICE SECURE STRAP 25 ABSORB (INSTRUMENTS) IMPLANT
DEVICE TROCAR PUNCTURE CLOSURE (ENDOMECHANICALS) ×2 IMPLANT
DRAPE ARM DVNC X/XI (DISPOSABLE) ×4 IMPLANT
DRAPE CARDIOVASCULAR INCISE (DRAPES) ×2
DRAPE COLUMN DVNC XI (DISPOSABLE) ×1 IMPLANT
DRAPE CV SPLIT W-CLR ANES SCRN (DRAPES) ×2 IMPLANT
DRAPE DA VINCI XI ARM (DISPOSABLE) ×8
DRAPE DA VINCI XI COLUMN (DISPOSABLE) ×2
DRAPE ORTHO SPLIT 77X108 STRL (DRAPES)
DRAPE SRG 135X102X78XABS (DRAPES) ×1 IMPLANT
DRAPE SURG ORHT 6 SPLT 77X108 (DRAPES) IMPLANT
GLOVE BIO SURGEON STRL SZ7.5 (GLOVE) ×4 IMPLANT
GOWN STRL REUS W/ TWL LRG LVL3 (GOWN DISPOSABLE) ×2 IMPLANT
GOWN STRL REUS W/ TWL XL LVL3 (GOWN DISPOSABLE) ×2 IMPLANT
GOWN STRL REUS W/TWL 2XL LVL3 (GOWN DISPOSABLE) ×2 IMPLANT
GOWN STRL REUS W/TWL LRG LVL3 (GOWN DISPOSABLE) ×4
GOWN STRL REUS W/TWL XL LVL3 (GOWN DISPOSABLE) ×4
KIT BASIN OR (CUSTOM PROCEDURE TRAY) ×2 IMPLANT
KIT TURNOVER KIT B (KITS) ×2 IMPLANT
MARKER SKIN DUAL TIP RULER LAB (MISCELLANEOUS) ×2 IMPLANT
MESH VENTRALIGHT ST 8X10 (Mesh General) ×2 IMPLANT
NEEDLE HYPO 22GX1.5 SAFETY (NEEDLE) ×2 IMPLANT
OBTURATOR OPTICAL STANDARD 8MM (TROCAR)
OBTURATOR OPTICAL STND 8 DVNC (TROCAR)
OBTURATOR OPTICALSTD 8 DVNC (TROCAR) IMPLANT
PAD ARMBOARD 7.5X6 YLW CONV (MISCELLANEOUS) ×4 IMPLANT
PENCIL SMOKE EVACUATOR (MISCELLANEOUS) IMPLANT
SCISSORS LAP 5X35 DISP (ENDOMECHANICALS) ×2 IMPLANT
SEAL CANN UNIV 5-8 DVNC XI (MISCELLANEOUS) ×3 IMPLANT
SEAL XI 5MM-8MM UNIVERSAL (MISCELLANEOUS) ×6
SET IRRIG TUBING LAPAROSCOPIC (IRRIGATION / IRRIGATOR) IMPLANT
SET TUBE SMOKE EVAC HIGH FLOW (TUBING) ×2 IMPLANT
STAPLER CANNULA SEAL DVNC XI (STAPLE) ×1 IMPLANT
STAPLER CANNULA SEAL XI (STAPLE) ×2
STOPCOCK 4 WAY LG BORE MALE ST (IV SETS) ×2 IMPLANT
SUT DVC VLOC 180 0 12IN GS21 (SUTURE) ×6
SUT MNCRL AB 4-0 PS2 18 (SUTURE) ×2 IMPLANT
SUT VICRYL 0 27 CT2 27 ABS (SUTURE) ×4 IMPLANT
SUT VLOC 180 0 9IN  GS21 (SUTURE)
SUT VLOC 180 0 9IN GS21 (SUTURE) IMPLANT
SUT VLOC 180 2-0 9IN GS21 (SUTURE) IMPLANT
SUTURE DVC VLC 180 0 12IN GS21 (SUTURE) ×3 IMPLANT
TOWEL GREEN STERILE FF (TOWEL DISPOSABLE) ×2 IMPLANT
TRAY LAPAROSCOPIC MC (CUSTOM PROCEDURE TRAY) ×2 IMPLANT
TROCAR XCEL NON-BLD 5MMX100MML (ENDOMECHANICALS) ×2 IMPLANT

## 2020-09-13 NOTE — Anesthesia Postprocedure Evaluation (Signed)
Anesthesia Post Note  Patient: Stacy Garcia  Procedure(s) Performed: ROBOTIC INCISIONAL HERNIA REPAIR WITH MESH (N/A Abdomen) INSERTION OF MESH (N/A Abdomen)     Patient location during evaluation: PACU Anesthesia Type: General Level of consciousness: awake and alert Pain management: pain level controlled Vital Signs Assessment: post-procedure vital signs reviewed and stable Respiratory status: spontaneous breathing, nonlabored ventilation, respiratory function stable and patient connected to nasal cannula oxygen Cardiovascular status: blood pressure returned to baseline and stable Postop Assessment: no apparent nausea or vomiting Anesthetic complications: no   No complications documented.  Last Vitals:  Vitals:   09/13/20 1245 09/13/20 1259  BP: (!) 151/87 (!) 159/79  Pulse: 66 78  Resp: 14 18  Temp:  37.3 C  SpO2: 96% 96%    Last Pain:  Vitals:   09/13/20 1245  TempSrc:   PainSc: Asleep                 Kennieth Rad

## 2020-09-13 NOTE — Transfer of Care (Signed)
Immediate Anesthesia Transfer of Care Note  Patient: Stacy Garcia  Procedure(s) Performed: ROBOTIC INCISIONAL HERNIA REPAIR WITH MESH (N/A Abdomen) INSERTION OF MESH (N/A Abdomen)  Patient Location: PACU  Anesthesia Type:General  Level of Consciousness: awake, alert  and oriented  Airway & Oxygen Therapy: Patient Spontanous Breathing  Post-op Assessment: Report given to RN and Post -op Vital signs reviewed and stable  Post vital signs: Reviewed and stable  Last Vitals:  Vitals Value Taken Time  BP 160/76 09/13/20 1138  Temp 36.5 C 09/13/20 1138  Pulse 63 09/13/20 1140  Resp 11 09/13/20 1140  SpO2 96 % 09/13/20 1140  Vitals shown include unvalidated device data.  Last Pain:  Vitals:   09/13/20 0720  TempSrc:   PainSc: 0-No pain         Complications: No complications documented.

## 2020-09-13 NOTE — H&P (Signed)
History of Present Illness The patient is a 70 year old female who presents with an incisional hernia. Patient is a 70 year old female who calls back today after CT scan. 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 He's also appeared to be above the level of the umbilicus.  Patient states that she would like to proceed with surgery at this point to help relieve the pain and discomfort she's having.   ------------------------------- Referred by: Dr. Leonette Most fields Chief Complaint: Incisional hernia  Patient is a 70 year old female who previously underwent an open aortic tube repair for AAA approximately 4 years ago. Patient states that approximately at surgery she noticed a hernia to the peri-umbilical area. Patient has had some increase in size as well as some pain. Patient states that she does have some discomfort in the area. She does feel that she like to have repaired secondary to be discomfort. Patient was a previous smoker and currently does not smoke. Patient's a previous C-section the past.    Allergies predniSONE *CORTICOSTEROIDS*  Allergies Reconciled   Medication History Atorvastatin Calcium (40MG  Tablet, Oral) Active. diazePAM (5MG  Tablet, Oral) Active. Gabapentin (100MG  Capsule, Oral) Active. Levothyroxine Sodium ( Tablet, Oral) Active. Medications Reconciled    Review of Systems  Skin Not Present- Change in Wart/Mole, Dryness, Hives, Jaundice, New Lesions, Non-Healing Wounds, Rash and Ulcer. HEENT Not Present- Earache, Hearing Loss, Hoarseness, Nose Bleed, Oral Ulcers, Ringing in the Ears, Seasonal Allergies, Sinus Pain, Sore Throat, Visual Disturbances, Wears glasses/contact lenses and Yellow Eyes. Breast Not Present- Breast Mass, Breast Pain, Nipple Discharge and Skin Changes. Female Genitourinary Not Present- Frequency, Nocturia, Painful Urination, Pelvic Pain and Urgency. Psychiatric  Not Present- Anxiety, Bipolar, Change in Sleep Pattern, Depression, Fearful and Frequent crying. Endocrine Present- Hot flashes. Not Present- Cold Intolerance, Excessive Hunger, Hair Changes, Heat Intolerance and New Diabetes. All other systems negative  Vitals  06/26/2020 10:57 AM Weight: 163.13 lb Height: 65in Body Surface Area: 1.81 m Body Mass Index: 27.15 kg/m  Temp.: 97.55F  Pulse: 106 (Regular)  BP: 130/74(Sitting, Left Arm, Standard)       Physical Exam  The physical exam findings are as follows: Note: Constitutional: No acute distress, conversant, appears stated age  Eyes: Anicteric sclerae, moist conjunctiva, no lid lag  Neck: No thyromegaly, trachea midline, no cervical lymphadenopathy  Lungs: Clear to auscultation biilaterally, normal respiratory effot  Cardiovascular: regular rate & rhythm, no murmurs, no peripheal edema, pedal pulses 2+  GI: Soft, no masses or hepatosplenomegaly, non-tender to palpation  MSK: Normal gait, no clubbing cyanosis, edema  Skin: No rashes, palpation reveals normal skin turgor  Psychiatric: Appropriate judgment and insight, oriented to person, place, and time  Abdomen Inspection Hernias - Incisional - Reducible (Left periumbilical Incisional hernia) . Note: Constitutional: No acute distress, conversant, appears stated age  Eyes: Anicteric sclerae, moist conjunctiva, no lid lag  Neck: No thyromegaly, trachea midline, no cervical lymphadenopathy  Lungs: Clear to auscultation biilaterally, normal respiratory effot  Cardiovascular: regular rate & rhythm, no murmurs, no peripheal edema, pedal pulses 2+  GI: Soft, no masses or hepatosplenomegaly, non-tender to palpation  MSK: Normal gait, no clubbing cyanosis, edema  Skin: No rashes, palpation reveals normal skin turgor  Psychiatric: Appropriate judgment and insight, oriented to person, place, and time     Assessment & Plan   INCISIONAL HERNIA, WITHOUT OBSTRUCTION OR GANGRENE (K43.2) Impression: Patient is a 70 year old female with  incisional hernia. 1. Patient with excellent candidate for robotic  retrorectus incisional hernia repair with mesh. 2. All risks and benefits were discussed with the patient to generally include, but not limited to: infection, bleeding, damage to surrounding structures, acute and chronic nerve pain, and recurrence. Alternatives were offered and described. All questions were answered and the patient voiced understanding of the procedure and wishes to proceed at this point with hernia repair.

## 2020-09-13 NOTE — Anesthesia Procedure Notes (Signed)
Procedure Name: Intubation Date/Time: 09/13/2020 8:39 AM Performed by: Griffin Dakin, CRNA Pre-anesthesia Checklist: Patient identified, Emergency Drugs available, Suction available and Patient being monitored Patient Re-evaluated:Patient Re-evaluated prior to induction Oxygen Delivery Method: Circle system utilized Preoxygenation: Pre-oxygenation with 100% oxygen Induction Type: IV induction Ventilation: Mask ventilation without difficulty Laryngoscope Size: Mac and 3 Grade View: Grade I Tube type: Oral Tube size: 7.0 mm Number of attempts: 1 Airway Equipment and Method: Stylet and Oral airway Placement Confirmation: ETT inserted through vocal cords under direct vision,  positive ETCO2 and breath sounds checked- equal and bilateral Secured at: 21 cm Tube secured with: Tape Dental Injury: Teeth and Oropharynx as per pre-operative assessment

## 2020-09-13 NOTE — Op Note (Signed)
09/13/2020  11:20 AM  PATIENT:  Stacy Garcia  70 y.o. female  PRE-OPERATIVE DIAGNOSIS:  INCISIONAL HERNIA  POST-OPERATIVE DIAGNOSIS:  INCISIONAL HERNIA  PROCEDURE:  Procedure(s): ROBOTIC INCISIONAL HERNIA REPAIR WITH MESH RETRORECTUS REPAIR (N/A)  SURGEON:  Surgeon(s) and Role:    * Axel Filler, MD - Primary  ASSISTANTS: Berenda Morale, RNFA   ANESTHESIA:   local, regional and general  EBL:  minimal   BLOOD ADMINISTERED:none  DRAINS: none   LOCAL MEDICATIONS USED:  BUPIVICAINE  and OTHER exparil  SPECIMEN:  No Specimen  DISPOSITION OF SPECIMEN:  N/A  COUNTS:  YES  TOURNIQUET:  * No tourniquets in log *  DICTATION: .Dragon Dictation Details of procedure: After the patient was consented he was taken back to the OR and placed in the supine position with bilateral SCDs in place. He underwent general endotracheal anesthesia. Patient was prepped and draped in standard fashion. Timeout was called all facts verified.  An Optiview technique was used to enter the left retrorectus space.  At this time insufflation was begun.  The camera was used to help dissect the retrorectus space on the left side.  At this time a 8 mm working trocar was placed in the left upper quadrant area.  At this time I was able to free up a majority of the left retrorectus space from the superior to the inferior direction.  The crossover was made in the epigastrium.  The right retrorectus space and posterior rectus fascia was incised.  At this time I created a space on the right side.  At this time an 8 mm trocar was placed in the epigastrium in the right upper quadrant area.  At this time the robot was docked.  At this time I created a space on the right side of the retrorectus space.  The midline linea alba was then cleared out.  The majority of this dissection was carried out in the preperitoneal space.  The hernia was well visualized and there was seen to be 3 hernias in the midline.  These were  taken down and contained of preperitoneal fat and omental fat.  The dissection of the linea alba and the retrorectus base were then taken down to the superior pubic space.  This was described up in the preperitoneal space was not able to be entered.  At this time a 0 V-Loc was used to reapproximate the posterior rectus fascia.  This was done with undue tension.  There was a small defect in the epigastric area where the camera was introduced.  At this time a 0 V-Loc x2 was used to reapproximate the linea alba and the rectus diastases.  Both the anterior and posterior fascia came together without undue tension.  At this time the area was measured.  This measured approximately 25 x 15 cm.  A piece of ventral light ST mesh was cut to shape and placed to the retrorectus space.  This was unrolled and lay flat.  This did cover both the superior defect of the retrorectus space.  At this time the insufflation was evacuated.  All trochars were removed.  Patient taught the procedure well was taken to the recovery in stable condition.  PLAN OF CARE: Admit for overnight observation  PATIENT DISPOSITION:  PACU - hemodynamically stable.   Delay start of Pharmacological VTE agent (>24hrs) due to surgical blood loss or risk of bleeding: not applicable

## 2020-09-14 ENCOUNTER — Encounter (HOSPITAL_COMMUNITY): Payer: Self-pay | Admitting: General Surgery

## 2020-09-14 DIAGNOSIS — K432 Incisional hernia without obstruction or gangrene: Secondary | ICD-10-CM | POA: Diagnosis not present

## 2020-09-14 MED ORDER — TRAMADOL HCL 50 MG PO TABS: 50.0000 mg | ORAL_TABLET | Freq: Four times a day (QID) | ORAL | 0 refills | Status: DC | PRN

## 2020-09-14 NOTE — Discharge Summary (Signed)
Physician Discharge Summary  Patient ID: Stacy Garcia MRN: [id number] DOB/AGE: [birth year] 72 y.o.  Admit date: 09/13/2020 Discharge date: 09/14/2020  Admission Diagnoses:hernia  Discharge Diagnoses:  Active Problems:   S/P hernia repair   Discharged Condition: good  Hospital Course: Pt underwent hernia repair.  Please see OR for full details.  Pt had no issues post op and pain was well controlled.  Pt was tol reg PO.  Pt was ambulating well with no issues.  Pt was deemed stable for DC'd and Dc'dhome.  Consults: None  Significant Diagnostic Studies: none  Treatments: surgery: as above  Discharge Exam: Blood pressure 105/65, pulse (!) 106, temperature 98.9 F (37.2 C), temperature source Oral, resp. rate 16, height 5\' 5"  (1.651 m), weight 71.3 kg, SpO2 92 %. General appearance: alert and cooperative GI: soft, non-tender; bowel sounds normal; no masses,  no organomegaly  Disposition: Discharge disposition: 01-Home or Self Care       Discharge Instructions    Diet - low sodium heart healthy   Complete by: As directed    Increase activity slowly   Complete by: As directed      Allergies as of 09/14/2020      Reactions   Prednisone    "Swelling of face"      Medication List    TAKE these medications   amoxicillin-clavulanate 875-125 MG tablet Commonly known as: Augmentin Take 1 tablet by mouth 2 (two) times daily.   aspirin 325 MG EC tablet Take 325 mg by mouth daily.   atorvastatin 40 MG tablet Commonly known as: LIPITOR TAKE 1 TABLET EVERY DAY   diazepam 5 MG tablet Commonly known as: VALIUM TAKE 1/2 TO 1 TABLET BY MOUTH TWICE A DAY AS NEEDED What changed: See the new instructions.   gabapentin 100 MG capsule Commonly known as: NEURONTIN TAKE 1 CAPSULE BY MOUTH THREE TIMES A DAY   HYDROcodone-homatropine 5-1.5 MG/5ML syrup Commonly known as: HYCODAN Take 5 mLs by mouth every 8 (eight) hours as needed for cough.   ibuprofen 200 MG tablet Commonly  known as: ADVIL Take 400 mg by mouth every 6 (six) hours as needed for moderate pain or headache.   levothyroxine 88 MCG tablet Commonly known as: SYNTHROID TAKE 1 TABLET (88 MCG TOTAL) BY MOUTH DAILY.   traMADol 50 MG tablet Commonly known as: Ultram Take 1 tablet (50 mg total) by mouth every 6 (six) hours as needed.       Follow-up Information    11/12/2020, MD. Schedule an appointment as soon as possible for a visit in 2 weeks.   Specialty: General Surgery Why: Post op visit Contact information: [address] [place] [phone]               Signed: [phone] 09/14/2020, 7:21 AM

## 2020-09-14 NOTE — Discharge Instructions (Signed)
[facility] _______[facility] °INGUINAL HERNIA REPAIR: POST OP INSTRUCTIONS ° °Always review your discharge instruction sheet given to you by the facility where your surgery was performed. °IF YOU HAVE DISABILITY OR FAMILY LEAVE FORMS, YOU MUST BRING THEM TO THE OFFICE FOR PROCESSING.   °DO NOT GIVE THEM TO YOUR DOCTOR. ° °1. A  prescription for pain medication may be given to you upon discharge.  Take your pain medication as prescribed, if needed.  If narcotic pain medicine is not needed, then you may take acetaminophen (Tylenol) or ibuprofen (Advil) as needed. °2. Take your usually prescribed medications unless otherwise directed. °If you need a refill on your pain medication, please contact your pharmacy.  They will contact our office to request authorization. Prescriptions will not be filled after 5 pm or on week-ends. °3. You should follow a light diet the first 24 hours after arrival home, such as soup and crackers, etc.  Be sure to include lots of fluids daily.  Resume your normal diet the day after surgery. °4.Most patients will experience some swelling and bruising around the umbilicus or in the groin and scrotum.  Ice packs and reclining will help.  Swelling and bruising can take several days to resolve.  °6. It is common to experience some constipation if taking pain medication after surgery.  Increasing fluid intake and taking a stool softener (such as Colace) will usually help or prevent this problem from occurring.  A mild laxative (Milk of Magnesia or Miralax) should be taken according to package directions if there are no bowel movements after 48 hours. °7. Unless discharge instructions indicate otherwise, you may remove your bandages 24-48 hours after surgery, and you may shower at that time.  You may have steri-strips (small skin tapes) in place directly over the incision.  These strips should be left on the skin for 7-10 days.  If your surgeon used skin glue on the incision, you may  shower in 24 hours.  The glue will flake off over the next 2-3 weeks.  Any sutures or staples will be removed at the office during your follow-up visit. °8. ACTIVITIES:  You may resume regular (light) daily activities beginning the next day--such as daily self-care, walking, climbing stairs--gradually increasing activities as tolerated.  You may have sexual intercourse when it is comfortable.  Refrain from any heavy lifting or straining until approved by your doctor. ° °a.You may drive when you are no longer taking prescription pain medication, you can comfortably wear a seatbelt, and you can safely maneuver your car and apply brakes. °b.RETURN TO WORK:   °_____________________________________________ ° °9.You should see your doctor in the office for a follow-up appointment approximately 2-3 weeks after your surgery.  Make sure that you call for this appointment within a day or two after you arrive home to insure a convenient appointment time. °10.OTHER INSTRUCTIONS: _________________________ °   _____________________________________ ° °WHEN TO CALL YOUR DOCTOR: °1. Fever over 101.0 °2. Inability to urinate °3. Nausea and/or vomiting °4. Extreme swelling or bruising °5. Continued bleeding from incision. °6. Increased pain, redness, or drainage from the incision ° °The clinic staff is available to answer your questions during regular business hours.  Please don’t hesitate to call and ask to speak to one of the nurses for clinical concerns.  If you have a medical emergency, go to the nearest emergency room or call 911.  A surgeon from [facility] is always on call at the hospital ° ° °[address], [address], [place], [place]  [address] ? ° [address] [address], [place], [place]   [address] °[phone] ? [phone] ? FAX [phone] °Web site: [email or web address] ° °

## 2020-09-14 NOTE — Progress Notes (Signed)
Discharge instructions reviewed with pt and instructed on where to pick up prescription.  Pt verbalized understanding and had no questions.  Pt discharged in stable condition via wheelchair.  Stacy Garcia   

## 2020-10-02 ENCOUNTER — Other Ambulatory Visit: Payer: Self-pay | Admitting: Adult Health

## 2020-10-03 ENCOUNTER — Telehealth: Payer: Self-pay | Admitting: Adult Health

## 2020-10-03 NOTE — Telephone Encounter (Signed)
Left a message for a return call. Pt needs to be scheduled for a CPX.

## 2020-10-03 NOTE — Telephone Encounter (Signed)
Sent to the pharmacy by e-scribe.  I scheduled the pt for a physical.  Nothing further needed.

## 2020-10-03 NOTE — Telephone Encounter (Signed)
Spoke to the pt and scheduled her for a physical.  Nothing further needed.

## 2020-10-03 NOTE — Telephone Encounter (Signed)
Stacy Garcia pt stated she is returning your call and want you to call her back.

## 2020-10-25 ENCOUNTER — Ambulatory Visit: Payer: Medicare HMO | Admitting: Orthopaedic Surgery

## 2020-10-31 ENCOUNTER — Ambulatory Visit: Payer: Medicare HMO | Admitting: Orthopaedic Surgery

## 2020-10-31 DIAGNOSIS — D485 Neoplasm of uncertain behavior of skin: Secondary | ICD-10-CM | POA: Diagnosis not present

## 2020-10-31 DIAGNOSIS — B079 Viral wart, unspecified: Secondary | ICD-10-CM | POA: Diagnosis not present

## 2020-11-09 ENCOUNTER — Other Ambulatory Visit: Payer: Self-pay

## 2020-11-10 ENCOUNTER — Ambulatory Visit (INDEPENDENT_AMBULATORY_CARE_PROVIDER_SITE_OTHER): Payer: Medicare HMO | Admitting: Adult Health

## 2020-11-10 ENCOUNTER — Encounter: Payer: Self-pay | Admitting: Adult Health

## 2020-11-10 VITALS — BP 130/82 | HR 71 | Temp 97.4°F | Resp 16 | Ht 65.0 in | Wt 157.0 lb

## 2020-11-10 DIAGNOSIS — Z0000 Encounter for general adult medical examination without abnormal findings: Secondary | ICD-10-CM | POA: Diagnosis not present

## 2020-11-10 DIAGNOSIS — E018 Other iodine-deficiency related thyroid disorders and allied conditions: Secondary | ICD-10-CM | POA: Diagnosis not present

## 2020-11-10 DIAGNOSIS — H8110 Benign paroxysmal vertigo, unspecified ear: Secondary | ICD-10-CM

## 2020-11-10 DIAGNOSIS — J439 Emphysema, unspecified: Secondary | ICD-10-CM

## 2020-11-10 DIAGNOSIS — M5116 Intervertebral disc disorders with radiculopathy, lumbar region: Secondary | ICD-10-CM | POA: Diagnosis not present

## 2020-11-10 DIAGNOSIS — E782 Mixed hyperlipidemia: Secondary | ICD-10-CM | POA: Diagnosis not present

## 2020-11-10 LAB — COMPREHENSIVE METABOLIC PANEL
ALT: 19 U/L (ref 0–35)
AST: 18 U/L (ref 0–37)
Albumin: 4.2 g/dL (ref 3.5–5.2)
Alkaline Phosphatase: 101 U/L (ref 39–117)
BUN: 15 mg/dL (ref 6–23)
CO2: 30 mEq/L (ref 19–32)
Calcium: 9.7 mg/dL (ref 8.4–10.5)
Chloride: 103 mEq/L (ref 96–112)
Creatinine, Ser: 0.97 mg/dL (ref 0.40–1.20)
GFR: 58.55 mL/min — ABNORMAL LOW (ref >60.00)
Glucose, Bld: 101 mg/dL — ABNORMAL HIGH (ref 70–99)
Potassium: 4.4 mEq/L (ref 3.5–5.1)
Sodium: 142 mEq/L (ref 135–145)
Total Bilirubin: 0.9 mg/dL (ref 0.2–1.2)
Total Protein: 6.8 g/dL (ref 6.0–8.3)

## 2020-11-10 LAB — LIPID PANEL
Cholesterol: 182 mg/dL (ref 0–200)
HDL: 52 mg/dL (ref >39.00)
LDL Cholesterol: 97 mg/dL (ref 0–99)
NonHDL: 129.79
Total CHOL/HDL Ratio: 3
Triglycerides: 165 mg/dL — ABNORMAL HIGH (ref 0.0–149.0)
VLDL: 33 mg/dL (ref 0.0–40.0)

## 2020-11-10 LAB — CBC WITH DIFFERENTIAL/PLATELET
Basophils Absolute: 0 10*3/uL (ref 0.0–0.1)
Basophils Relative: 0.5 % (ref 0.0–3.0)
Eosinophils Absolute: 0.2 10*3/uL (ref 0.0–0.7)
Eosinophils Relative: 2.3 % (ref 0.0–5.0)
HCT: 47.7 % — ABNORMAL HIGH (ref 36.0–46.0)
Hemoglobin: 15.9 g/dL — ABNORMAL HIGH (ref 12.0–15.0)
Lymphocytes Relative: 40.7 % (ref 12.0–46.0)
Lymphs Abs: 3 10*3/uL (ref 0.7–4.0)
MCHC: 33.4 g/dL (ref 30.0–36.0)
MCV: 91.3 fl (ref 78.0–100.0)
Monocytes Absolute: 0.4 10*3/uL (ref 0.1–1.0)
Monocytes Relative: 5.5 % (ref 3.0–12.0)
Neutro Abs: 3.7 10*3/uL (ref 1.4–7.7)
Neutrophils Relative %: 51 % (ref 43.0–77.0)
Platelets: 220 10*3/uL (ref 150.0–400.0)
RBC: 5.22 Mil/uL — ABNORMAL HIGH (ref 3.87–5.11)
RDW: 14.5 % (ref 11.5–15.5)
WBC: 7.4 10*3/uL (ref 4.0–10.5)

## 2020-11-10 LAB — TSH: TSH: 1.27 u[IU]/mL (ref 0.35–4.50)

## 2020-11-10 NOTE — Progress Notes (Signed)
Subjective:    Patient ID: Stacy Garcia, female    DOB: Oct 09, 1950, 70 y.o.   MRN: [id number]  HPI Patient presents for yearly preventative medicine examination. He is a pleasant 70 year old female who  has a past medical history of AAA (abdominal aortic aneurysm) ([place]), Arthritis, COPD (chronic obstructive pulmonary disease) ([place]), Dyspnea (01/28/2017), GERD (gastroesophageal reflux disease), Hyperlipidemia, Hypothyroidism, Thyroid disease, Tobacco abuse, and Vertigo.  Hypothyroidism-treatment includes Synthroid 100 mcg daily.  She does feel well controlled  Hyperlipidemia-prescribed Lipitor 40 mg and aspirin 81 mg daily.  She denies myalgia or fatigue  History of vertigo-takes Valium as needed  COPD-is followed by pulmonary on a routine basis.  Not currently using any daily inhalers and feels well controlled without them  Chronic Low Back Pain -was seen by physical medicine and rehab in the past as well as orthopedics.  Has been prescribed gabapentin which she did feel was effective and has had steroid shots which she does not want to undergo any longer.  She is interested in seeing another pain management provider.   All immunizations and health maintenance protocols were reviewed with the patient and needed orders were placed.  Appropriate screening laboratory values were ordered for the patient including screening of hyperlipidemia, renal function and hepatic function.   Medication reconciliation,  past medical history, social history, problem list and allergies were reviewed in detail with the patient  Goals were established with regard to weight loss, exercise, and  diet in compliance with medications Wt Readings from Last 3 Encounters:  11/10/20 157 lb (71.2 kg)  09/13/20 157 lb 3 oz (71.3 kg)  09/05/20 157 lb 3.2 oz (71.3 kg)      Review of Systems  Constitutional: Negative.   HENT: Negative.   Eyes: Negative.   Respiratory: Positive for shortness of breath (chronic  ).   Cardiovascular: Negative.   Gastrointestinal: Negative.   Endocrine: Negative.   Genitourinary: Negative.   Musculoskeletal: Positive for arthralgias and back pain.  Skin: Negative.   Allergic/Immunologic: Negative.   Neurological: Negative.   Hematological: Negative.   Psychiatric/Behavioral: Negative.    Past Medical History:  Diagnosis Date  . AAA (abdominal aortic aneurysm) ([place])   . Arthritis   . COPD (chronic obstructive pulmonary disease) ([place])   . Dyspnea 01/28/2017   just recently started wheezing  . GERD (gastroesophageal reflux disease)   . Hyperlipidemia   . Hypothyroidism   . Thyroid disease   . Tobacco abuse   . Vertigo     Social History   Socioeconomic History  . Marital status: Married    Spouse name: Not on file  . Number of children: 3  . Years of education: Not on file  . Highest education level: Not on file  Occupational History  . Occupation: Designer/manufacturing    Comment: Librarian, academic; retired 2017  . 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 Smoking status: Former Smoker    Packs/day: 1.00    Years: 50.00    Pack years: 50.00    Types: Cigarettes    Quit date: 12/31/2016    Years since quitting: 3.8  . Smokeless tobacco: Never Used  Vaping Use  . Vaping Use: Never used  Substance and Sexual Activity  . Alcohol use: No    Alcohol/week: 0.0 standard drinks  . Drug use: No  . Sexual activity: Not Currently  Other Topics Concern  . Not on file  Social History  Narrative   11/03/2018:   Lives with husband in 2 story home   Has 3 children, 7 grandchildren, 27 great-grandchildren, all of whom live close by   Takes care of two of her great grandchildren (50 yo and 46yo)   Horticulturist, commercial working, 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   Social Determinants of Health   Financial Resource Strain: Low Risk   . Difficulty of Paying Living Expenses: Not hard at all  Food  Insecurity: No Food Insecurity  . Worried About Charity fundraiser in the Last Year: Never true  . Ran Out of Food in the Last Year: Never true  Transportation Needs: No Transportation Needs  . Lack of Transportation (Medical): No  . Lack of Transportation (Non-Medical): No  Physical Activity: Insufficiently Active  . Days of Exercise per Week: 2 days  . Minutes of Exercise per Session: 30 min  Stress: No Stress Concern Present  . Feeling of Stress : Only a little  Social Connections: Socially Integrated  . Frequency of Communication with Friends and Family: More than three times a week  . Frequency of Social Gatherings with Friends and Family: More than three times a week  . Attends Religious Services: More than 4 times per year  . Active Member of Clubs or Organizations: Yes  . Attends Archivist Meetings: More than 4 times per year  . Marital Status: Married  Human resources officer Violence: Not At Risk  . Fear of Current or Ex-Partner: No  . Emotionally Abused: No  . Physically Abused: No  . Sexually Abused: No    Past Surgical History:  Procedure Laterality Date  . ABDOMINAL AORTIC ANEURYSM REPAIR N/A 02/05/2017   Procedure: ANEURYSM ABDOMINAL AORTIC REPAIR;  Surgeon: Elam Dutch, MD;  Location: [facility] OR;  Service: Vascular;  Laterality: N/A;  . CESAREAN SECTION     x 3  . INSERTION OF MESH N/A 09/13/2020   Procedure: INSERTION OF MESH;  Surgeon: Ralene Ok, MD;  Location: [facility];  Service: General;  Laterality: N/A;  . JOINT REPLACEMENT    . ROBOTIC INCISIONAL HERNIA REPAIR WITH MESH RETRORECTUS REPAIR (N/  09/13/2020  . SHOULDER SURGERY  02/2015  . TOTAL KNEE ARTHROPLASTY Right 01/23/2016   Procedure: TOTAL KNEE ARTHROPLASTY;  Surgeon: Garald Balding, MD;  Location: [facility];  Service: Orthopedics;  Laterality: Right;  . TUBAL LIGATION    . XI ROBOTIC ASSISTED VENTRAL HERNIA N/A 09/13/2020   Procedure: ROBOTIC INCISIONAL HERNIA REPAIR WITH MESH;  Surgeon: Ralene Ok, MD;  Location: [facility] OR;  Service: General;  Laterality: N/A;    Family History  Problem Relation Age of Onset  . Thyroid disease Other   . Diabetes Other   . Hyperlipidemia Other   . Hypertension Other     Allergies  Allergen Reactions  . Prednisone     "Swelling of face"    Current Outpatient Medications on File Prior to Visit  Medication Sig Dispense Refill  . aspirin 325 MG EC tablet Take 325 mg by mouth daily.    Marland Kitchen atorvastatin (LIPITOR) 40 MG tablet TAKE 1 TABLET EVERY DAY (Patient taking differently: Take 40 mg by mouth daily.) 90 tablet 3  . diazepam (VALIUM) 5 MG tablet TAKE 1/2 TO 1 TABLET BY MOUTH TWICE A DAY AS NEEDED (Patient taking differently: Take 5 mg by mouth 2 (two) times daily as needed (vertigo).) 30 tablet 2  . ibuprofen (ADVIL) 200 MG tablet Take 400 mg  by mouth every 6 (six) hours as needed for moderate pain or headache.    . levothyroxine (SYNTHROID) 88 MCG tablet TAKE 1 TABLET  DAILY. (NEEDS AN APPOINTMENT) 90 tablet 0  . traMADol (ULTRAM) 50 MG tablet Take 1 tablet (50 mg total) by mouth every 6 (six) hours as needed. 20 tablet 0   No current facility-administered medications on file prior to visit.    BP 130/82 (BP Location: Left Arm, Patient Position: Sitting, Cuff Size: Small)   Pulse 71   Temp (!) 97.4 F (36.3 C) (Oral)   Resp 16   Ht 5\' 5"  (1.651 m)   Wt 157 lb (71.2 kg)   SpO2 100%   BMI 26.13 kg/m       Objective:   Physical Exam Vitals and nursing note reviewed.  Constitutional:      General: She is not in acute distress.    Appearance: Normal appearance. She is well-developed. She is not ill-appearing.  HENT:     Head: Normocephalic and atraumatic.     Right Ear: Tympanic membrane, ear canal and external ear normal. There is no impacted cerumen.     Left Ear: Tympanic membrane, ear canal and external ear normal. There is no impacted cerumen.     Nose: Nose normal. No congestion or rhinorrhea.     Mouth/Throat:     Mouth:  Mucous membranes are moist.     Pharynx: Oropharynx is clear. No oropharyngeal exudate or posterior oropharyngeal erythema.  Eyes:     General:        Right eye: No discharge.        Left eye: No discharge.     Extraocular Movements: Extraocular movements intact.     Conjunctiva/sclera: Conjunctivae normal.     Pupils: Pupils are equal, round, and reactive to light.  Neck:     Thyroid: No thyromegaly.     Vascular: No carotid bruit.     Trachea: No tracheal deviation.  Cardiovascular:     Rate and Rhythm: Normal rate and regular rhythm.     Pulses: Normal pulses.     Heart sounds: Normal heart sounds. No murmur heard. No friction rub. No gallop.   Pulmonary:     Effort: Pulmonary effort is normal. No respiratory distress.     Breath sounds: Normal breath sounds. No stridor. No wheezing, rhonchi or rales.  Chest:     Chest wall: No tenderness.  Abdominal:     General: Abdomen is flat. Bowel sounds are normal. There is no distension.     Palpations: Abdomen is soft. There is no mass.     Tenderness: There is no abdominal tenderness. There is no right CVA tenderness, left CVA tenderness, guarding or rebound.     Hernia: No hernia is present.  Musculoskeletal:        General: No swelling, tenderness, deformity or signs of injury. Normal range of motion.     Cervical back: Normal range of motion and neck supple.     Right lower leg: No edema.     Left lower leg: No edema.  Lymphadenopathy:     Cervical: No cervical adenopathy.  Skin:    General: Skin is warm and dry.     Coloration: Skin is not jaundiced or pale.     Findings: No bruising, erythema, lesion or rash.  Neurological:     General: No focal deficit present.     Mental Status: She is alert and oriented to person, place, and  time.     Cranial Nerves: No cranial nerve deficit.     Sensory: No sensory deficit.     Motor: No weakness.     Coordination: Coordination normal.     Gait: Gait normal.     Deep Tendon  Reflexes: Reflexes normal.  Psychiatric:        Mood and Affect: Mood normal.        Behavior: Behavior normal.        Thought Content: Thought content normal.        Judgment: Judgment normal.       Assessment & Plan:  1. Routine general medical examination at a health care facility - She appears to be doing well.  - Encouraged diet and exercise - Follow up in one year or sooner if needed - CBC with Differential/Platelet; Future - Comprehensive metabolic panel; Future - Lipid panel; Future - TSH; Future - TSH - Lipid panel - Comprehensive metabolic panel - CBC with Differential/Platelet  2. HYPOTHYROIDISM, POST-RADIOACTIVE IODINE - Consider increase in synthroid  - CBC with Differential/Platelet; Future - Comprehensive metabolic panel; Future - Lipid panel; Future - TSH; Future - TSH - Lipid panel - Comprehensive metabolic panel - CBC with Differential/Platelet  3. Mixed hyperlipidemia - Consider increase in statin  - CBC with Differential/Platelet; Future - Comprehensive metabolic panel; Future - Lipid panel; Future - TSH; Future - TSH - Lipid panel - Comprehensive metabolic panel - CBC with Differential/Platelet  4. Lumbar disc disease with radiculopathy  - Ambulatory referral to Pain Clinic  5. Pulmonary emphysema, unspecified emphysema type ([place]) - Follow up with Pulmonary as directed   6. Benign paroxysmal positional vertigo, unspecified laterality - Continue with Valium PRN

## 2020-11-18 DIAGNOSIS — Z9181 History of falling: Secondary | ICD-10-CM | POA: Diagnosis not present

## 2020-11-18 DIAGNOSIS — Z79899 Other long term (current) drug therapy: Secondary | ICD-10-CM | POA: Diagnosis not present

## 2020-11-18 DIAGNOSIS — Z1159 Encounter for screening for other viral diseases: Secondary | ICD-10-CM | POA: Diagnosis not present

## 2020-11-18 DIAGNOSIS — Z6826 Body mass index (BMI) 26.0-26.9, adult: Secondary | ICD-10-CM | POA: Diagnosis not present

## 2020-11-18 DIAGNOSIS — E559 Vitamin D deficiency, unspecified: Secondary | ICD-10-CM | POA: Diagnosis not present

## 2020-11-18 DIAGNOSIS — M25561 Pain in right knee: Secondary | ICD-10-CM | POA: Diagnosis not present

## 2020-11-18 DIAGNOSIS — M129 Arthropathy, unspecified: Secondary | ICD-10-CM | POA: Diagnosis not present

## 2020-11-18 DIAGNOSIS — M545 Low back pain, unspecified: Secondary | ICD-10-CM | POA: Diagnosis not present

## 2020-11-30 DIAGNOSIS — Z79899 Other long term (current) drug therapy: Secondary | ICD-10-CM | POA: Diagnosis not present

## 2020-11-30 DIAGNOSIS — Z9181 History of falling: Secondary | ICD-10-CM | POA: Diagnosis not present

## 2020-11-30 DIAGNOSIS — M25561 Pain in right knee: Secondary | ICD-10-CM | POA: Diagnosis not present

## 2020-11-30 DIAGNOSIS — M545 Low back pain, unspecified: Secondary | ICD-10-CM | POA: Diagnosis not present

## 2020-11-30 DIAGNOSIS — Z6826 Body mass index (BMI) 26.0-26.9, adult: Secondary | ICD-10-CM | POA: Diagnosis not present

## 2020-12-28 ENCOUNTER — Encounter: Payer: Self-pay | Admitting: Vascular Surgery

## 2020-12-28 ENCOUNTER — Ambulatory Visit (HOSPITAL_COMMUNITY)
Admission: RE | Admit: 2020-12-28 | Discharge: 2020-12-28 | Disposition: A | Discharge status: Home / Self Care | Payer: Medicare HMO | Source: Ambulatory Visit | Attending: Vascular Surgery | Admitting: Vascular Surgery

## 2020-12-28 ENCOUNTER — Other Ambulatory Visit: Payer: Self-pay

## 2020-12-28 ENCOUNTER — Ambulatory Visit (INDEPENDENT_AMBULATORY_CARE_PROVIDER_SITE_OTHER): Payer: Medicare HMO | Admitting: Vascular Surgery

## 2020-12-28 VITALS — BP 137/82 | HR 64 | Temp 97.6°F | Resp 20 | Ht 65.0 in | Wt 155.0 lb

## 2020-12-28 DIAGNOSIS — I714 Abdominal aortic aneurysm, without rupture, unspecified: Secondary | ICD-10-CM

## 2020-12-28 NOTE — Progress Notes (Signed)
Patient is a 70 year old female who returns for follow-up today.  She underwent repair of abdominal aortic aneurysm with a 2 graft in May 2018.  At her last office visit she had developed a ventral hernia.  This has now been repaired.  She has quit smoking and continued to refrain from this.  She is still on aspirin and statin.  She really has no complaints.  Review of systems: She has no chest pain or shortness of breath.  Past Medical History:  Diagnosis Date  . AAA (abdominal aortic aneurysm) ([place])   . Arthritis   . COPD (chronic obstructive pulmonary disease) ([place])   . Dyspnea 01/28/2017   just recently started wheezing  . GERD (gastroesophageal reflux disease)   . Hyperlipidemia   . Hypothyroidism   . Thyroid disease   . Tobacco abuse   . Vertigo     Past Surgical History:  Procedure Laterality Date  . ABDOMINAL AORTIC ANEURYSM REPAIR N/A 02/05/2017   Procedure: ANEURYSM ABDOMINAL AORTIC REPAIR;  Surgeon: Elam Dutch, MD;  Location: [facility] OR;  Service: Vascular;  Laterality: N/A;  . CESAREAN SECTION     x 3  . INSERTION OF MESH N/A 09/13/2020   Procedure: INSERTION OF MESH;  Surgeon: Ralene Ok, MD;  Location: [place];  Service: General;  Laterality: N/A;  . JOINT REPLACEMENT    . ROBOTIC INCISIONAL HERNIA REPAIR WITH MESH RETRORECTUS REPAIR (N/  09/13/2020  . SHOULDER SURGERY  02/2015  . TOTAL KNEE ARTHROPLASTY Right 01/23/2016   Procedure: TOTAL KNEE ARTHROPLASTY;  Surgeon: Garald Balding, MD;  Location: [place];  Service: Orthopedics;  Laterality: Right;  . TUBAL LIGATION    . XI ROBOTIC ASSISTED VENTRAL HERNIA N/A 09/13/2020   Procedure: ROBOTIC INCISIONAL HERNIA REPAIR WITH MESH;  Surgeon: Ralene Ok, MD;  Location: [place];  Service: General;  Laterality: N/A;    Current Outpatient Medications on File Prior to Visit  Medication Sig Dispense Refill  . aspirin 325 MG EC tablet Take 325 mg by mouth daily.    Marland Kitchen atorvastatin (LIPITOR) 40 MG tablet TAKE 1 TABLET  EVERY DAY (Patient taking differently: Take 40 mg by mouth daily.) 90 tablet 3  . diazepam (VALIUM) 5 MG tablet TAKE 1/2 TO 1 TABLET BY MOUTH TWICE A DAY AS NEEDED (Patient taking differently: Take 5 mg by mouth 2 (two) times daily as needed (vertigo).) 30 tablet 2  . ibuprofen (ADVIL) 200 MG tablet Take 400 mg by mouth every 6 (six) hours as needed for moderate pain or headache.    . levothyroxine (SYNTHROID) 88 MCG tablet TAKE 1 TABLET  DAILY. (NEEDS AN APPOINTMENT) 90 tablet 0   No current facility-administered medications on file prior to visit.   Physical exam:  Vitals:   12/28/20 0832  BP: 137/82  Pulse: 64  Resp: 20  Temp: 97.6 F (36.4 C)  SpO2: 96%  Weight: 155 lb (70.3 kg)  Height: 5\' 5"  (1.651 m)    Abdomen: Soft nontender nondistended no obvious hernia defect no pulsatile mass  Extremities: 2+ femoral dorsalis pedis pulses bilaterally  Data: Patient had a duplex ultrasound of her abdominal aorta today which showed aortic diameter of 2.6 cm.  No evidence of perianastomotic aneurysm.  However the study was limited visualization due to overlying bowel gas  Assessment: Doing well now 4 years out from open abdominal aortic aneurysm repair.  Plan: Patient will follow up in 5 years with a CT angiogram abdomen and pelvis to again reassess for  perianastomotic aneurysm.  If everything looks good at that point she probably will not need further follow-up.  Ruta Hinds, MD [facility] Office: [phone]

## 2021-01-01 DIAGNOSIS — Z6826 Body mass index (BMI) 26.0-26.9, adult: Secondary | ICD-10-CM | POA: Diagnosis not present

## 2021-01-01 DIAGNOSIS — Z79899 Other long term (current) drug therapy: Secondary | ICD-10-CM | POA: Diagnosis not present

## 2021-01-01 DIAGNOSIS — Z9181 History of falling: Secondary | ICD-10-CM | POA: Diagnosis not present

## 2021-01-01 DIAGNOSIS — M25561 Pain in right knee: Secondary | ICD-10-CM | POA: Diagnosis not present

## 2021-01-01 DIAGNOSIS — M545 Low back pain, unspecified: Secondary | ICD-10-CM | POA: Diagnosis not present

## 2021-01-01 DIAGNOSIS — F1290 Cannabis use, unspecified, uncomplicated: Secondary | ICD-10-CM | POA: Diagnosis not present

## 2021-01-01 DIAGNOSIS — R030 Elevated blood-pressure reading, without diagnosis of hypertension: Secondary | ICD-10-CM | POA: Diagnosis not present

## 2021-01-01 DIAGNOSIS — F17210 Nicotine dependence, cigarettes, uncomplicated: Secondary | ICD-10-CM | POA: Diagnosis not present

## 2021-01-04 ENCOUNTER — Encounter: Payer: Self-pay | Admitting: Gastroenterology

## 2021-01-30 ENCOUNTER — Other Ambulatory Visit: Payer: Self-pay | Admitting: Adult Health

## 2021-01-30 DIAGNOSIS — J439 Emphysema, unspecified: Secondary | ICD-10-CM

## 2021-01-31 ENCOUNTER — Ambulatory Visit: Payer: Medicare HMO | Admitting: Pulmonary Disease

## 2021-01-31 ENCOUNTER — Other Ambulatory Visit: Payer: Self-pay | Admitting: Adult Health

## 2021-01-31 DIAGNOSIS — J439 Emphysema, unspecified: Secondary | ICD-10-CM

## 2021-01-31 MED ORDER — DIAZEPAM 5 MG PO TABS: ORAL_TABLET | ORAL | 2 refills | Status: DC

## 2021-01-31 NOTE — Addendum Note (Signed)
Addended by: Apolinar Junes on: 01/31/2021 04:19 PM   Modules accepted: Orders

## 2021-01-31 NOTE — Telephone Encounter (Signed)
Odered by accident  [place] to fill?

## 2021-02-01 ENCOUNTER — Other Ambulatory Visit: Payer: Self-pay | Admitting: Adult Health

## 2021-02-01 DIAGNOSIS — Z79899 Other long term (current) drug therapy: Secondary | ICD-10-CM | POA: Diagnosis not present

## 2021-02-01 DIAGNOSIS — F1290 Cannabis use, unspecified, uncomplicated: Secondary | ICD-10-CM | POA: Diagnosis not present

## 2021-02-01 DIAGNOSIS — R030 Elevated blood-pressure reading, without diagnosis of hypertension: Secondary | ICD-10-CM | POA: Diagnosis not present

## 2021-02-01 DIAGNOSIS — F17210 Nicotine dependence, cigarettes, uncomplicated: Secondary | ICD-10-CM | POA: Diagnosis not present

## 2021-02-01 DIAGNOSIS — Z6826 Body mass index (BMI) 26.0-26.9, adult: Secondary | ICD-10-CM | POA: Diagnosis not present

## 2021-02-01 DIAGNOSIS — Z9181 History of falling: Secondary | ICD-10-CM | POA: Diagnosis not present

## 2021-02-01 DIAGNOSIS — M545 Low back pain, unspecified: Secondary | ICD-10-CM | POA: Diagnosis not present

## 2021-02-01 DIAGNOSIS — M25561 Pain in right knee: Secondary | ICD-10-CM | POA: Diagnosis not present

## 2021-02-02 ENCOUNTER — Telehealth: Payer: Self-pay | Admitting: Adult Health

## 2021-02-02 ENCOUNTER — Other Ambulatory Visit: Payer: Self-pay

## 2021-02-02 MED ORDER — LEVOTHYROXINE SODIUM 88 MCG PO TABS: ORAL_TABLET | ORAL | 2 refills | Status: DC

## 2021-02-02 NOTE — Telephone Encounter (Signed)
Pt is calling in to see if she can get a refill on Rx atorvastatin (LIPITOR) 40 MG and levothyroxine (SYNTHROID) 88 MCG  Pharm:  [facility] mail order

## 2021-02-02 NOTE — Telephone Encounter (Signed)
This has been taking care of. Pt notified that thyroid medication would be sent to [facility]

## 2021-02-12 ENCOUNTER — Telehealth: Payer: Self-pay | Admitting: Pulmonary Disease

## 2021-02-12 MED ORDER — BENZONATATE 200 MG PO CAPS: 200.0000 mg | ORAL_CAPSULE | Freq: Three times a day (TID) | ORAL | 0 refills | Status: DC | PRN

## 2021-02-12 NOTE — Telephone Encounter (Signed)
Called and left message on voicemail to please return phone call. Contact number provided. 

## 2021-02-12 NOTE — Telephone Encounter (Signed)
I have called and spoke with Stacy Garcia and she stated that she has had the cough and sore throat x 2-3 days.  She stated that the cough is non productive.  She has used nyquil Saturday and Sunday night without any relief.  She denies any fever, chills, body aches.  Stacy Garcia is requesting something to help with the cough.  She stated that this is a very deep cough. No other symptoms.    VS please advise. Thanks

## 2021-02-12 NOTE — Telephone Encounter (Signed)
Can send script for tessalon 200 mg tid prn.  #30 with no refills.  She should also get COVID testing done.

## 2021-02-12 NOTE — Telephone Encounter (Signed)
Called and went over Dr Juanetta Gosling response/recommendations with patient. All questions answered and patient expressed full understanding of script being sent in and should get COVID testing done. Script sent to preferred pharmacy per Dr Halford Chessman. Nothing further needed at this time.

## 2021-03-05 DIAGNOSIS — M545 Low back pain, unspecified: Secondary | ICD-10-CM | POA: Diagnosis not present

## 2021-03-05 DIAGNOSIS — F17210 Nicotine dependence, cigarettes, uncomplicated: Secondary | ICD-10-CM | POA: Diagnosis not present

## 2021-03-05 DIAGNOSIS — M25561 Pain in right knee: Secondary | ICD-10-CM | POA: Diagnosis not present

## 2021-03-05 DIAGNOSIS — F1290 Cannabis use, unspecified, uncomplicated: Secondary | ICD-10-CM | POA: Diagnosis not present

## 2021-03-05 DIAGNOSIS — Z79899 Other long term (current) drug therapy: Secondary | ICD-10-CM | POA: Diagnosis not present

## 2021-03-05 DIAGNOSIS — R030 Elevated blood-pressure reading, without diagnosis of hypertension: Secondary | ICD-10-CM | POA: Diagnosis not present

## 2021-03-05 DIAGNOSIS — Z6826 Body mass index (BMI) 26.0-26.9, adult: Secondary | ICD-10-CM | POA: Diagnosis not present

## 2021-03-05 DIAGNOSIS — Z9181 History of falling: Secondary | ICD-10-CM | POA: Diagnosis not present

## 2021-03-07 ENCOUNTER — Other Ambulatory Visit: Payer: Self-pay | Admitting: Adult Health

## 2021-03-20 ENCOUNTER — Other Ambulatory Visit: Payer: Self-pay

## 2021-03-20 ENCOUNTER — Ambulatory Visit: Payer: Medicare HMO

## 2021-03-20 ENCOUNTER — Ambulatory Visit (INDEPENDENT_AMBULATORY_CARE_PROVIDER_SITE_OTHER): Payer: Medicare HMO

## 2021-03-20 DIAGNOSIS — Z0000 Encounter for general adult medical examination without abnormal findings: Secondary | ICD-10-CM | POA: Diagnosis not present

## 2021-03-20 DIAGNOSIS — E2839 Other primary ovarian failure: Secondary | ICD-10-CM | POA: Diagnosis not present

## 2021-03-20 DIAGNOSIS — Z1211 Encounter for screening for malignant neoplasm of colon: Secondary | ICD-10-CM | POA: Diagnosis not present

## 2021-03-20 NOTE — Patient Instructions (Signed)
Ms. Stacy Garcia , Thank you for taking time to come for your Medicare Wellness Visit. I appreciate your ongoing commitment to your health goals. Please review the following plan we discussed and let me know if I can assist you in the future.   Screening recommendations/referrals: Colonoscopy: Order placed 03/20/21 with [facility]  Mammogram: Done 08/24/20 repeat every year Bone Density: Order placed 03/20/21 Call [phone] to make appt  Recommended yearly ophthalmology/optometry visit for glaucoma screening and checkup Recommended yearly dental visit for hygiene and checkup  Vaccinations: Influenza vaccine: Due 04/09/21 Pneumococcal vaccine: Completed  Tdap vaccine: Done 02/24/12 repeat in 10 years 02/23/22 Shingles vaccine: Shingrix discussed. Please contact your pharmacy for coverage information.    Covid-19:Completed 1/22, 2/12, & 08/14/20  Advanced directives: Please bring a copy of your health care power of attorney and living will to the office at your convenience.  Conditions/risks identified: Lose weight   Next appointment: Follow up in one year for your annual wellness visit    Preventive Care 65 Years and Older, Female Preventive care refers to lifestyle choices and visits with your health care provider that can promote health and wellness. What does preventive care include? A yearly physical exam. This is also called an annual well check. Dental exams once or twice a year. Routine eye exams. Ask your health care provider how often you should have your eyes checked. Personal lifestyle choices, including: Daily care of your teeth and gums. Regular physical activity. Eating a healthy diet. Avoiding tobacco and drug use. Limiting alcohol use. Practicing safe sex. Taking low-dose aspirin every day. Taking vitamin and mineral supplements as recommended by your health care provider. What happens during an annual well check? The services and screenings done by your health  care provider during your annual well check will depend on your age, overall health, lifestyle risk factors, and family history of disease. Counseling  Your health care provider may ask you questions about your: Alcohol use. Tobacco use. Drug use. Emotional well-being. Home and relationship well-being. Sexual activity. Eating habits. History of falls. Memory and ability to understand (cognition). Work and work Statistician. Reproductive health. Screening  You may have the following tests or measurements: Height, weight, and BMI. Blood pressure. Lipid and cholesterol levels. These may be checked every 5 years, or more frequently if you are over 68 years old. Skin check. Lung cancer screening. You may have this screening every year starting at age 32 if you have a 30-pack-year history of smoking and currently smoke or have quit within the past 15 years. Fecal occult blood test (FOBT) of the stool. You may have this test every year starting at age 46. Flexible sigmoidoscopy or colonoscopy. You may have a sigmoidoscopy every 5 years or a colonoscopy every 10 years starting at age 31. Hepatitis C blood test. Hepatitis B blood test. Sexually transmitted disease (STD) testing. Diabetes screening. This is done by checking your blood sugar (glucose) after you have not eaten for a while (fasting). You may have this done every 1-3 years. Bone density scan. This is done to screen for osteoporosis. You may have this done starting at age 51. Mammogram. This may be done every 1-2 years. Talk to your health care provider about how often you should have regular mammograms. Talk with your health care provider about your test results, treatment options, and if necessary, the need for more tests. Vaccines  Your health care provider may recommend certain vaccines, such as: Influenza vaccine. This is recommended every year.  Tetanus, diphtheria, and acellular pertussis (Tdap, Td) vaccine. You may need a Td  booster every 10 years. Zoster vaccine. You may need this after age 25. Pneumococcal 13-valent conjugate (PCV13) vaccine. One dose is recommended after age 33. Pneumococcal polysaccharide (PPSV23) vaccine. One dose is recommended after age 21. Talk to your health care provider about which screenings and vaccines you need and how often you need them. This information is not intended to replace advice given to you by your health care provider. Make sure you discuss any questions you have with your health care provider. Document Released: 09/22/2015 Document Revised: 05/15/2016 Document Reviewed: 06/27/2015 Elsevier Interactive Patient Education  2017 [place] Prevention in the Home Falls can cause injuries. They can happen to people of all ages. There are many things you can do to make your home safe and to help prevent falls. What can I do on the outside of my home? Regularly fix the edges of walkways and driveways and fix any cracks. Remove anything that might make you trip as you walk through a door, such as a raised step or threshold. Trim any bushes or trees on the path to your home. Use bright outdoor lighting. Clear any walking paths of anything that might make someone trip, such as rocks or tools. Regularly check to see if handrails are loose or broken. Make sure that both sides of any steps have handrails. Any raised decks and porches should have guardrails on the edges. Have any leaves, snow, or ice cleared regularly. Use sand or salt on walking paths during winter. Clean up any spills in your garage right away. This includes oil or grease spills. What can I do in the bathroom? Use night lights. Install grab bars by the toilet and in the tub and shower. Do not use towel bars as grab bars. Use non-skid mats or decals in the tub or shower. If you need to sit down in the shower, use a plastic, non-slip stool. Keep the floor dry. Clean up any water that spills on the floor  as soon as it happens. Remove soap buildup in the tub or shower regularly. Attach bath mats securely with double-sided non-slip rug tape. Do not have throw rugs and other things on the floor that can make you trip. What can I do in the bedroom? Use night lights. Make sure that you have a light by your bed that is easy to reach. Do not use any sheets or blankets that are too big for your bed. They should not hang down onto the floor. Have a firm chair that has side arms. You can use this for support while you get dressed. Do not have throw rugs and other things on the floor that can make you trip. What can I do in the kitchen? Clean up any spills right away. Avoid walking on wet floors. Keep items that you use a lot in easy-to-reach places. If you need to reach something above you, use a strong step stool that has a grab bar. Keep electrical cords out of the way. Do not use floor polish or wax that makes floors slippery. If you must use wax, use non-skid floor wax. Do not have throw rugs and other things on the floor that can make you trip. What can I do with my stairs? Do not leave any items on the stairs. Make sure that there are handrails on both sides of the stairs and use them. Fix handrails that are broken or loose.  Make sure that handrails are as long as the stairways. Check any carpeting to make sure that it is firmly attached to the stairs. Fix any carpet that is loose or worn. Avoid having throw rugs at the top or bottom of the stairs. If you do have throw rugs, attach them to the floor with carpet tape. Make sure that you have a light switch at the top of the stairs and the bottom of the stairs. If you do not have them, ask someone to add them for you. What else can I do to help prevent falls? Wear shoes that: Do not have high heels. Have rubber bottoms. Are comfortable and fit you well. Are closed at the toe. Do not wear sandals. If you use a stepladder: Make sure that it is  fully opened. Do not climb a closed stepladder. Make sure that both sides of the stepladder are locked into place. Ask someone to hold it for you, if possible. Clearly mark and make sure that you can see: Any grab bars or handrails. First and last steps. Where the edge of each step is. Use tools that help you move around (mobility aids) if they are needed. These include: Canes. Walkers. Scooters. Crutches. Turn on the lights when you go into a dark area. Replace any light bulbs as soon as they burn out. Set up your furniture so you have a clear path. Avoid moving your furniture around. If any of your floors are uneven, fix them. If there are any pets around you, be aware of where they are. Review your medicines with your doctor. Some medicines can make you feel dizzy. This can increase your chance of falling. Ask your doctor what other things that you can do to help prevent falls. This information is not intended to replace advice given to you by your health care provider. Make sure you discuss any questions you have with your health care provider. Document Released: 06/22/2009 Document Revised: 02/01/2016 Document Reviewed: 09/30/2014 Elsevier Interactive Patient Education  2017 [employer].

## 2021-03-20 NOTE — Progress Notes (Signed)
Subjective:   Stacy Garcia is a 70 y.o. female who presents for Medicare Annual (Subsequent) preventive examination.  Review of Systems     Cardiac Risk Factors include: advanced age (>71men, >35 women);dyslipidemia     Objective:    There were no vitals filed for this visit. There is no height or weight on file to calculate BMI.  Advanced Directives 03/20/2021 09/13/2020 09/05/2020 03/08/2020 12/23/2019 11/17/2018 11/03/2018  Does Patient Have a Medical Advance Directive? Yes No No No No Yes Yes  Type of Advance Directive [place];Living will [place];Living will  Copy of [place] in Chart? No - copy requested - - - - No - copy requested No - copy requested  Would patient like information on creating a medical advance directive? - No - Patient declined No - Patient declined No - Patient declined No - Patient declined - -    Current Medications (verified) Outpatient Encounter Medications as of 03/20/2021  Medication Sig   aspirin 325 MG EC tablet Take 325 mg by mouth daily.   atorvastatin (LIPITOR) 40 MG tablet TAKE 1 TABLET EVERY DAY   diazepam (VALIUM) 5 MG tablet TAKE 1/2 TO 1 TABLET BY MOUTH TWICE A DAY AS NEEDED   HYDROcodone-acetaminophen (NORCO/VICODIN) 5-325 MG tablet    ibuprofen (ADVIL) 200 MG tablet Take 400 mg by mouth every 6 (six) hours as needed for moderate pain or headache.   levothyroxine (SYNTHROID) 88 MCG tablet TAKE 1 TABLET  DAILY   [DISCONTINUED] benzonatate (TESSALON) 200 MG capsule Take 1 capsule (200 mg total) by mouth 3 (three) times daily as needed for cough. (Patient not taking: Reported on 03/20/2021)   No facility-administered encounter medications on file as of 03/20/2021.    Allergies (verified) Prednisone   History: Past Medical History:  Diagnosis Date   AAA (abdominal aortic aneurysm) (HCC)    Arthritis    COPD (chronic obstructive pulmonary  disease) ([place])    Dyspnea 01/28/2017   just recently started wheezing   GERD (gastroesophageal reflux disease)    Hyperlipidemia    Hypothyroidism    Thyroid disease    Tobacco abuse    Vertigo    Past Surgical History:  Procedure Laterality Date   ABDOMINAL AORTIC ANEURYSM REPAIR N/A 02/05/2017   Procedure: ANEURYSM ABDOMINAL AORTIC REPAIR;  Surgeon: Elam Dutch, MD;  Location: [place];  Service: Vascular;  Laterality: N/A;   CESAREAN SECTION     x 3   INSERTION OF MESH N/A 09/13/2020   Procedure: INSERTION OF MESH;  Surgeon: Ralene Ok, MD;  Location: [place];  Service: General;  Laterality: N/A;   JOINT REPLACEMENT     ROBOTIC INCISIONAL HERNIA REPAIR WITH MESH RETRORECTUS REPAIR (N/  09/13/2020   SHOULDER SURGERY  02/2015   TOTAL KNEE ARTHROPLASTY Right 01/23/2016   Procedure: TOTAL KNEE ARTHROPLASTY;  Surgeon: Garald Balding, MD;  Location: [place];  Service: Orthopedics;  Laterality: Right;   TUBAL LIGATION     XI ROBOTIC ASSISTED VENTRAL HERNIA N/A 09/13/2020   Procedure: ROBOTIC INCISIONAL HERNIA REPAIR WITH MESH;  Surgeon: Ralene Ok, MD;  Location: [place];  Service: General;  Laterality: N/A;   Family History  Problem Relation Age of Onset   Thyroid disease Other    Diabetes Other    Hyperlipidemia Other    Hypertension Other    Social History   Socioeconomic History   Marital status:  Married    Spouse name: Not on file   Number of children: 3   Years of education: Not on file   Highest education level: Not on file  Occupational History   Occupation: Designer/manufacturing    Comment: Librarian, academic; retired 2017   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  Tobacco Use   Smoking status: Former    Packs/day: 1.00    Years: 50.00    Pack years: 50.00    Types: Cigarettes    Quit date: 12/31/2016    Years since quitting: 4.2   Smokeless tobacco: Never  Vaping Use   Vaping Use: Never used  Substance and Sexual Activity   Alcohol use:  No    Alcohol/week: 0.0 standard drinks   Drug use: No   Sexual activity: Not Currently  Other Topics Concern   Not on file  Social History Narrative   11/03/2018:   Lives with husband in 2 story home   Has 3 children, 7 grandchildren, 21 great-grandchildren, all of whom live close by   Takes care of two of her great grandchildren (2 yo and 64yo)   Horticulturist, commercial working, 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   Social Determinants of Health   Financial Resource Strain: Low Risk    Difficulty of Paying Living Expenses: Not hard at all  Food Insecurity: No Food Insecurity   Worried About Charity fundraiser in the Last Year: Never true   [place] in the Last Year: Never true  Transportation Needs: No Transportation Needs   Lack of Transportation (Medical): No   Lack of Transportation (Non-Medical): No  Physical Activity: Inactive   Days of Exercise per Week: 0 days   Minutes of Exercise per Session: 0 min  Stress: No Stress Concern Present   Feeling of Stress : Not at all  Social Connections: Socially Integrated   Frequency of Communication with Friends and Family: Once a week   Frequency of Social Gatherings with Friends and Family: More than three times a week   Attends Religious Services: More than 4 times per year   Active Member of [employer] or Organizations: Yes   Attends Archivist Meetings: 1 to 4 times per year   Marital Status: Married    Tobacco Counseling Counseling given: Not Answered   Clinical Intake:  Pre-visit preparation completed: Yes  Pain : No/denies pain     BMI - recorded: 25.79 Nutritional Status: BMI 25 -29 Overweight Nutritional Risks: None Diabetes: No  How often do you need to have someone help you when you read instructions, pamphlets, or other written materials from your doctor or pharmacy?: 1 - Never  Diabetic?No  Interpreter Needed?: No  Information entered by :: Charlott Rakes,  LPN   Activities of Daily Living In your present state of health, do you have any difficulty performing the following activities: 03/20/2021 09/13/2020  Hearing? N -  Vision? N -  Difficulty concentrating or making decisions? N -  Walking or climbing stairs? Y -  Comment SOB -  Dressing or bathing? N -  Doing errands, shopping? N N  Preparing Food and eating ? N -  Using the Toilet? N -  In the past six months, have you accidently leaked urine? N -  Do you have problems with loss of bowel control? N -  Managing your Medications? N -  Managing your Finances? N -  Housekeeping or managing your Housekeeping?  N -  Some recent data might be hidden    Patient Care Team: [name], NP as PCP - General (Family Medicine) Garald Balding, MD as Consulting Physician (Orthopedic Surgery) Chesley Mires, MD as Consulting Physician (Pulmonary Disease)  Indicate any recent Medical Services you may have received from other than [facility] providers in the past year (date may be approximate).     Assessment:   This is a routine wellness examination for Sarita.  Hearing/Vision screen Hearing Screening - Comments:: Pt denies any hearing issues Vision Screening - Comments:: Pt [facility] with [facility] for annual eye exams  Dietary issues and exercise activities discussed: Current Exercise Habits: The patient does not participate in regular exercise at present   Goals Addressed             This Visit's Progress    Patient Stated       Lose weight         Depression Screen PHQ 2/9 Scores 03/20/2021 03/08/2020 10/20/2019 07/29/2019 11/03/2018 09/15/2018 10/16/2015  PHQ - 2 Score 0 0 0 2 0 0 0  PHQ- 9 Score - 1 - 8 0 - -    Fall Risk Fall Risk  03/20/2021 03/08/2020 10/20/2019 07/29/2019 11/03/2018  Falls in the past year? 0 0 0 0 0  Number falls in past yr: 0 0 0 - -  Injury with Fall? 0 0 0 - -  Risk for fall due to : - Impaired balance/gait - - -  Risk for fall due to: Comment - vertigo  episodes at times - - -  Follow up Falls prevention discussed Falls prevention discussed - - -    FALL RISK PREVENTION PERTAINING TO THE HOME:  Any stairs in or around the home? Yes  If so, are there any without handrails? No  Home free of loose throw rugs in walkways, pet beds, electrical cords, etc? Yes  Adequate lighting in your home to reduce risk of falls? Yes   ASSISTIVE DEVICES UTILIZED TO PREVENT FALLS:  Life alert? No  Use of a cane, walker or w/c? No  Grab bars in the bathroom? No  Shower chair or bench in shower? No  Elevated toilet seat or a handicapped toilet? No   TIMED UP AND GO:  Was the test performed? No    Cognitive Function:     6CIT Screen 03/20/2021 03/08/2020  What Year? 0 points 0 points  What month? 0 points 0 points  What time? 0 points 0 points  Count back from 20 0 points 0 points  Months in reverse 0 points 0 points  Repeat phrase 0 points 0 points  Total Score 0 0    Immunizations Immunization History  Administered Date(s) Administered   Fluad Quad(high Dose 65+) 06/14/2019, 08/14/2020   Influenza Whole 07/20/2007, 07/17/2010   Influenza, High Dose Seasonal PF 06/19/2017, 07/01/2018   Influenza,inj,Quad PF,6+ Mos 09/01/2013, 10/26/2015   PFIZER(Purple Top)SARS-COV-2 Vaccination 10/01/2019, 10/22/2019, 08/14/2020   Pneumococcal Conjugate-13 06/19/2017   Pneumococcal Polysaccharide-23 08/01/2008, 10/26/2015   Td 09/09/2001   Tdap 02/24/2012    TDAP status: Up to date  Flu Vaccine status: Up to date  Pneumococcal vaccine status: Up to date  Covid-19 vaccine status: Completed vaccines  Qualifies for Shingles Vaccine? Yes   Zostavax completed No   Shingrix Completed?: No.    Education has been provided regarding the importance of this vaccine. Patient has been advised to call insurance company to determine out of pocket expense if they have  not yet received this vaccine. Advised may also receive vaccine at local pharmacy or Health  Dept. Verbalized acceptance and understanding.  Screening Tests Health Maintenance  Topic Date Due   Zoster Vaccines- Shingrix (1 of 2) Never done   DEXA SCAN  Never done   COVID-19 Vaccine (4 - Booster for Pfizer series) 11/12/2020   COLONOSCOPY (Pts 45-94yrs Insurance coverage will need to be confirmed)  01/06/2021   INFLUENZA VACCINE  04/09/2021   TETANUS/TDAP  02/23/2022   MAMMOGRAM  08/24/2022   Hepatitis C Screening  Completed   PNA vac Low Risk Adult  Completed   HPV VACCINES  Aged Out    Health Maintenance  Health Maintenance Due  Topic Date Due   Zoster Vaccines- Shingrix (1 of 2) Never done   DEXA SCAN  Never done   COVID-19 Vaccine (4 - Booster for [place] series) 11/12/2020   COLONOSCOPY (Pts 45-27yrs Insurance coverage will need to be confirmed)  01/06/2021    Colorectal cancer screening: Referral to GI placed 03/20/21. Pt aware the office will call re: appt.  Mammogram status: Completed 08/24/20. Repeat every year  Bone Density status: Ordered 03/20/21. Pt provided with contact info and advised to call to schedule appt.   Additional Screening:  Hepatitis C Screening:  Completed 06/19/17  Vision Screening: Recommended annual ophthalmology exams for early detection of glaucoma and other disorders of the eye. Is the patient up to date with their annual eye exam?  Yes  Who is the provider or what is the name of the office in which the patient attends annual eye exams? [facility] If pt is not established with a provider, would they like to be referred to a provider to establish care? No .   Dental Screening: Recommended annual dental exams for proper oral hygiene  Community Resource Referral / Chronic Care Management: CRR required this visit?  No   CCM required this visit?  No      Plan:     I have personally reviewed and noted the following in the patient's chart:   Medical and social history Use of alcohol, tobacco or illicit drugs  Current medications  and supplements including opioid prescriptions.  Functional ability and status Nutritional status Physical activity Advanced directives List of other physicians Hospitalizations, surgeries, and ER visits in previous 12 months Vitals Screenings to include cognitive, depression, and falls Referrals and appointments  In addition, I have reviewed and discussed with patient certain preventive protocols, quality metrics, and best practice recommendations. A written personalized care plan for preventive services as well as general preventive health recommendations were provided to patient.     Willette Brace, LPN   [id number]   Nurse Notes: None

## 2021-04-03 DIAGNOSIS — F17210 Nicotine dependence, cigarettes, uncomplicated: Secondary | ICD-10-CM | POA: Diagnosis not present

## 2021-04-03 DIAGNOSIS — Z6826 Body mass index (BMI) 26.0-26.9, adult: Secondary | ICD-10-CM | POA: Diagnosis not present

## 2021-04-03 DIAGNOSIS — F1290 Cannabis use, unspecified, uncomplicated: Secondary | ICD-10-CM | POA: Diagnosis not present

## 2021-04-03 DIAGNOSIS — M25561 Pain in right knee: Secondary | ICD-10-CM | POA: Diagnosis not present

## 2021-04-03 DIAGNOSIS — Z9181 History of falling: Secondary | ICD-10-CM | POA: Diagnosis not present

## 2021-04-03 DIAGNOSIS — M545 Low back pain, unspecified: Secondary | ICD-10-CM | POA: Diagnosis not present

## 2021-04-03 DIAGNOSIS — R030 Elevated blood-pressure reading, without diagnosis of hypertension: Secondary | ICD-10-CM | POA: Diagnosis not present

## 2021-04-03 DIAGNOSIS — Z79899 Other long term (current) drug therapy: Secondary | ICD-10-CM | POA: Diagnosis not present

## 2021-05-08 DIAGNOSIS — M545 Low back pain, unspecified: Secondary | ICD-10-CM | POA: Diagnosis not present

## 2021-05-08 DIAGNOSIS — Z1159 Encounter for screening for other viral diseases: Secondary | ICD-10-CM | POA: Diagnosis not present

## 2021-05-08 DIAGNOSIS — F17210 Nicotine dependence, cigarettes, uncomplicated: Secondary | ICD-10-CM | POA: Diagnosis not present

## 2021-05-08 DIAGNOSIS — M129 Arthropathy, unspecified: Secondary | ICD-10-CM | POA: Diagnosis not present

## 2021-05-08 DIAGNOSIS — E559 Vitamin D deficiency, unspecified: Secondary | ICD-10-CM | POA: Diagnosis not present

## 2021-05-08 DIAGNOSIS — Z79899 Other long term (current) drug therapy: Secondary | ICD-10-CM | POA: Diagnosis not present

## 2021-05-08 DIAGNOSIS — M25561 Pain in right knee: Secondary | ICD-10-CM | POA: Diagnosis not present

## 2021-05-08 DIAGNOSIS — Z9181 History of falling: Secondary | ICD-10-CM | POA: Diagnosis not present

## 2021-05-08 DIAGNOSIS — Z6826 Body mass index (BMI) 26.0-26.9, adult: Secondary | ICD-10-CM | POA: Diagnosis not present

## 2021-05-08 DIAGNOSIS — F1290 Cannabis use, unspecified, uncomplicated: Secondary | ICD-10-CM | POA: Diagnosis not present

## 2021-06-11 DIAGNOSIS — Z79899 Other long term (current) drug therapy: Secondary | ICD-10-CM | POA: Diagnosis not present

## 2021-06-11 DIAGNOSIS — F1290 Cannabis use, unspecified, uncomplicated: Secondary | ICD-10-CM | POA: Diagnosis not present

## 2021-06-11 DIAGNOSIS — M25561 Pain in right knee: Secondary | ICD-10-CM | POA: Diagnosis not present

## 2021-06-11 DIAGNOSIS — M545 Low back pain, unspecified: Secondary | ICD-10-CM | POA: Diagnosis not present

## 2021-06-11 DIAGNOSIS — Z6826 Body mass index (BMI) 26.0-26.9, adult: Secondary | ICD-10-CM | POA: Diagnosis not present

## 2021-06-11 DIAGNOSIS — F17210 Nicotine dependence, cigarettes, uncomplicated: Secondary | ICD-10-CM | POA: Diagnosis not present

## 2021-06-11 DIAGNOSIS — Z9181 History of falling: Secondary | ICD-10-CM | POA: Diagnosis not present

## 2021-06-13 ENCOUNTER — Other Ambulatory Visit: Payer: Self-pay | Admitting: Adult Health

## 2021-06-13 DIAGNOSIS — J439 Emphysema, unspecified: Secondary | ICD-10-CM

## 2021-06-13 DIAGNOSIS — Z79899 Other long term (current) drug therapy: Secondary | ICD-10-CM | POA: Diagnosis not present

## 2021-07-12 DIAGNOSIS — F17210 Nicotine dependence, cigarettes, uncomplicated: Secondary | ICD-10-CM | POA: Diagnosis not present

## 2021-07-12 DIAGNOSIS — F1220 Cannabis dependence, uncomplicated: Secondary | ICD-10-CM | POA: Diagnosis not present

## 2021-07-12 DIAGNOSIS — M545 Low back pain, unspecified: Secondary | ICD-10-CM | POA: Diagnosis not present

## 2021-07-12 DIAGNOSIS — Z79899 Other long term (current) drug therapy: Secondary | ICD-10-CM | POA: Diagnosis not present

## 2021-07-12 DIAGNOSIS — Z9181 History of falling: Secondary | ICD-10-CM | POA: Diagnosis not present

## 2021-07-12 DIAGNOSIS — M25561 Pain in right knee: Secondary | ICD-10-CM | POA: Diagnosis not present

## 2021-07-12 DIAGNOSIS — Z6826 Body mass index (BMI) 26.0-26.9, adult: Secondary | ICD-10-CM | POA: Diagnosis not present

## 2021-07-12 DIAGNOSIS — F1290 Cannabis use, unspecified, uncomplicated: Secondary | ICD-10-CM | POA: Diagnosis not present

## 2021-07-27 ENCOUNTER — Encounter: Payer: Self-pay | Admitting: Gastroenterology

## 2021-08-10 DIAGNOSIS — E7800 Pure hypercholesterolemia, unspecified: Secondary | ICD-10-CM | POA: Diagnosis not present

## 2021-08-10 DIAGNOSIS — Z6826 Body mass index (BMI) 26.0-26.9, adult: Secondary | ICD-10-CM | POA: Diagnosis not present

## 2021-08-10 DIAGNOSIS — M25561 Pain in right knee: Secondary | ICD-10-CM | POA: Diagnosis not present

## 2021-08-10 DIAGNOSIS — Z9181 History of falling: Secondary | ICD-10-CM | POA: Diagnosis not present

## 2021-08-10 DIAGNOSIS — Z79899 Other long term (current) drug therapy: Secondary | ICD-10-CM | POA: Diagnosis not present

## 2021-08-10 DIAGNOSIS — Z0000 Encounter for general adult medical examination without abnormal findings: Secondary | ICD-10-CM | POA: Diagnosis not present

## 2021-08-10 DIAGNOSIS — Z131 Encounter for screening for diabetes mellitus: Secondary | ICD-10-CM | POA: Diagnosis not present

## 2021-08-10 DIAGNOSIS — M545 Low back pain, unspecified: Secondary | ICD-10-CM | POA: Diagnosis not present

## 2021-08-10 DIAGNOSIS — F1290 Cannabis use, unspecified, uncomplicated: Secondary | ICD-10-CM | POA: Diagnosis not present

## 2021-08-10 DIAGNOSIS — F17210 Nicotine dependence, cigarettes, uncomplicated: Secondary | ICD-10-CM | POA: Diagnosis not present

## 2021-08-10 DIAGNOSIS — E559 Vitamin D deficiency, unspecified: Secondary | ICD-10-CM | POA: Diagnosis not present

## 2021-09-13 DIAGNOSIS — Z79899 Other long term (current) drug therapy: Secondary | ICD-10-CM | POA: Diagnosis not present

## 2021-09-13 DIAGNOSIS — M545 Low back pain, unspecified: Secondary | ICD-10-CM | POA: Diagnosis not present

## 2021-09-13 DIAGNOSIS — F17210 Nicotine dependence, cigarettes, uncomplicated: Secondary | ICD-10-CM | POA: Diagnosis not present

## 2021-09-13 DIAGNOSIS — F1290 Cannabis use, unspecified, uncomplicated: Secondary | ICD-10-CM | POA: Diagnosis not present

## 2021-09-13 DIAGNOSIS — Z9181 History of falling: Secondary | ICD-10-CM | POA: Diagnosis not present

## 2021-09-13 DIAGNOSIS — M25561 Pain in right knee: Secondary | ICD-10-CM | POA: Diagnosis not present

## 2021-09-13 DIAGNOSIS — Z6826 Body mass index (BMI) 26.0-26.9, adult: Secondary | ICD-10-CM | POA: Diagnosis not present

## 2021-09-15 DIAGNOSIS — Z1231 Encounter for screening mammogram for malignant neoplasm of breast: Secondary | ICD-10-CM | POA: Diagnosis not present

## 2021-09-15 LAB — HM MAMMOGRAPHY

## 2021-09-17 ENCOUNTER — Other Ambulatory Visit: Payer: Self-pay

## 2021-09-17 ENCOUNTER — Other Ambulatory Visit: Payer: Self-pay | Admitting: Gastroenterology

## 2021-09-17 ENCOUNTER — Ambulatory Visit (AMBULATORY_SURGERY_CENTER): Payer: Medicare HMO | Admitting: *Deleted

## 2021-09-17 ENCOUNTER — Telehealth: Payer: Self-pay | Admitting: *Deleted

## 2021-09-17 VITALS — Ht 65.0 in | Wt 150.0 lb

## 2021-09-17 DIAGNOSIS — Z86010 Personal history of colonic polyps: Secondary | ICD-10-CM

## 2021-09-17 MED ORDER — NA SULFATE-K SULFATE-MG SULF 17.5-3.13-1.6 GM/177ML PO SOLN: 1.0000 | Freq: Once | ORAL | 0 refills | Status: DC

## 2021-09-17 MED ORDER — NA SULFATE-K SULFATE-MG SULF 17.5-3.13-1.6 GM/177ML PO SOLN: 1.0000 | ORAL | 0 refills | Status: DC

## 2021-09-17 NOTE — Progress Notes (Signed)
No egg or soy allergy known to patient  No issues known to pt with past sedation with any surgeries or procedures Patient denies ever being told they had issues or difficulty with intubation  No FH of Malignant Hyperthermia Pt is not on diet pills Pt is not on  home 02  Pt is not on blood thinners  Pt denies issues with constipation  No A fib or A flutter  Pt is fully vaccinated  for Covid    Due to the COVID-19 pandemic we are asking patients to follow certain guidelines in [place] and the [place]   Pt aware of COVID protocols and [facility] guidelines    Sample sheet for miralax sent.

## 2021-09-17 NOTE — Telephone Encounter (Signed)
Patient notified the suprep is not covered per [facility]. Pt requested the rx be sent to [facility] sent. Patient aware.

## 2021-09-17 NOTE — Telephone Encounter (Signed)
Explained to the patient that suprep is not covered per [facility]. She requested that we send the Rx suprep to [facility] sent. Patient aware.

## 2021-09-25 ENCOUNTER — Other Ambulatory Visit: Payer: Self-pay | Admitting: Gastroenterology

## 2021-10-02 ENCOUNTER — Encounter: Payer: Self-pay | Admitting: Gastroenterology

## 2021-10-08 ENCOUNTER — Encounter: Payer: Medicare HMO | Admitting: Gastroenterology

## 2021-10-10 ENCOUNTER — Other Ambulatory Visit: Payer: Self-pay | Admitting: Adult Health

## 2021-10-11 ENCOUNTER — Encounter: Payer: Self-pay | Admitting: Adult Health

## 2021-10-11 DIAGNOSIS — F17210 Nicotine dependence, cigarettes, uncomplicated: Secondary | ICD-10-CM | POA: Diagnosis not present

## 2021-10-11 DIAGNOSIS — M25561 Pain in right knee: Secondary | ICD-10-CM | POA: Diagnosis not present

## 2021-10-11 DIAGNOSIS — Z6825 Body mass index (BMI) 25.0-25.9, adult: Secondary | ICD-10-CM | POA: Diagnosis not present

## 2021-10-11 DIAGNOSIS — F1220 Cannabis dependence, uncomplicated: Secondary | ICD-10-CM | POA: Diagnosis not present

## 2021-10-11 DIAGNOSIS — M545 Low back pain, unspecified: Secondary | ICD-10-CM | POA: Diagnosis not present

## 2021-10-11 DIAGNOSIS — F1290 Cannabis use, unspecified, uncomplicated: Secondary | ICD-10-CM | POA: Diagnosis not present

## 2021-10-11 DIAGNOSIS — Z79899 Other long term (current) drug therapy: Secondary | ICD-10-CM | POA: Diagnosis not present

## 2021-10-23 ENCOUNTER — Encounter: Payer: Self-pay | Admitting: Adult Health

## 2021-10-23 ENCOUNTER — Ambulatory Visit (INDEPENDENT_AMBULATORY_CARE_PROVIDER_SITE_OTHER): Payer: Medicare HMO | Admitting: Adult Health

## 2021-10-23 VITALS — BP 136/88 | HR 104 | Temp 98.6°F | Ht 65.0 in | Wt 152.0 lb

## 2021-10-23 DIAGNOSIS — R059 Cough, unspecified: Secondary | ICD-10-CM

## 2021-10-23 LAB — POC COVID19 BINAXNOW: SARS Coronavirus 2 Ag: NEGATIVE

## 2021-10-23 MED ORDER — AZITHROMYCIN 250 MG PO TABS: ORAL_TABLET | ORAL | 0 refills | Status: AC

## 2021-10-23 MED ORDER — GUAIFENESIN-DM 100-10 MG/5ML PO SYRP: 5.0000 mL | ORAL_SOLUTION | ORAL | 0 refills | Status: DC | PRN

## 2021-10-23 NOTE — Progress Notes (Signed)
Subjective:    Patient ID: Stacy Garcia, female    DOB: Nov 23, 1950, 71 y.o.   MRN: [id number]  HPI 71 year old female who  has a past medical history of AAA (abdominal aortic aneurysm), Arthritis, Blood transfusion without reported diagnosis, COPD (chronic obstructive pulmonary disease) ([place]), Dyspnea (01/28/2017), Emphysema of lung ([place]), GERD (gastroesophageal reflux disease), Hyperlipidemia, Hypothyroidism, Thyroid disease, Tobacco abuse, and Vertigo.  She presents to the office today for an acute issue of semi productive cough x 1 week.   She denies fevers, chills, loss of state, wheezing, shortness of breath, sinus pain or pressure.   She has been using Nyquil without resolution    Review of Systems See HPI   Past Medical History:  Diagnosis Date   AAA (abdominal aortic aneurysm)    Arthritis    Blood transfusion without reported diagnosis    COPD (chronic obstructive pulmonary disease) ([place])    Dyspnea 01/28/2017   just recently started wheezing   Emphysema of lung (HCC)    GERD (gastroesophageal reflux disease)    Hyperlipidemia    Hypothyroidism    Thyroid disease    Tobacco abuse    Vertigo     Social History   Socioeconomic History   Marital status: Married    Spouse name: Not on file   Number of children: 3   Years of education: Not on file   Highest education level: Not on file  Occupational History   Occupation: Designer/manufacturing    Comment: Librarian, academic; retired 2017   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  Tobacco Use   Smoking status: Former    Packs/day: 1.00    Years: 50.00    Pack years: 50.00    Types: Cigarettes    Quit date: 12/31/2016    Years since quitting: 4.8   Smokeless tobacco: Never  Vaping Use   Vaping Use: Never used  Substance and Sexual Activity   Alcohol use: No    Alcohol/week: 0.0 standard drinks   Drug use: No   Sexual activity: Not Currently  Other Topics Concern   Not on file  Social  History Narrative   11/03/2018:   Lives with husband in 2 story home   Has 3 children, 7 grandchildren, 51 great-grandchildren, all of whom live close by   Takes care of two of her great grandchildren (49 yo and 44yo)   Horticulturist, commercial working, 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   Social Determinants of Health   Financial Resource Strain: Low Risk    Difficulty of Paying Living Expenses: Not hard at all  Food Insecurity: No Food Insecurity   Worried About Charity fundraiser in the Last Year: Never true   [place] in the Last Year: Never true  Transportation Needs: No Transportation Needs   Lack of Transportation (Medical): No   Lack of Transportation (Non-Medical): No  Physical Activity: Inactive   Days of Exercise per Week: 0 days   Minutes of Exercise per Session: 0 min  Stress: No Stress Concern Present   Feeling of Stress : Not at all  Social Connections: Socially Integrated   Frequency of Communication with Friends and Family: Once a week   Frequency of Social Gatherings with Friends and Family: More than three times a week   Attends Religious Services: More than 4 times per year   Active Member of [employer] or Organizations: Yes   Attends Club or  Organization Meetings: 1 to 4 times per year   Marital Status: Married  Human resources officer Violence: Not At Risk   Fear of Current or Ex-Partner: No   Emotionally Abused: No   Physically Abused: No   Sexually Abused: No    Past Surgical History:  Procedure Laterality Date   ABDOMINAL AORTIC ANEURYSM REPAIR N/A 02/05/2017   Procedure: ANEURYSM ABDOMINAL AORTIC REPAIR;  Surgeon: Elam Dutch, MD;  Location: [place];  Service: Vascular;  Laterality: N/A;   CESAREAN SECTION     x 3   INSERTION OF MESH N/A 09/13/2020   Procedure: INSERTION OF MESH;  Surgeon: Ralene Ok, MD;  Location: [place];  Service: General;  Laterality: N/A;   JOINT REPLACEMENT     ROBOTIC [place] (N/  09/13/2020   SHOULDER SURGERY  02/2015   TOTAL KNEE ARTHROPLASTY Right 01/23/2016   Procedure: TOTAL KNEE ARTHROPLASTY;  Surgeon: Garald Balding, MD;  Location: [place];  Service: Orthopedics;  Laterality: Right;   TUBAL LIGATION     XI ROBOTIC ASSISTED VENTRAL HERNIA N/A 09/13/2020   Procedure: ROBOTIC INCISIONAL HERNIA REPAIR WITH MESH;  Surgeon: Ralene Ok, MD;  Location: [place];  Service: General;  Laterality: N/A;    Family History  Problem Relation Age of Onset   Thyroid disease Other    Diabetes Other    Hyperlipidemia Other    Hypertension Other    Colon cancer Neg Hx    Colon polyps Neg Hx    Esophageal cancer Neg Hx    Rectal cancer Neg Hx    Stomach cancer Neg Hx     Allergies  Allergen Reactions   Prednisone     "Swelling of face"    Current Outpatient Medications on File Prior to Visit  Medication Sig Dispense Refill   aspirin 325 MG EC tablet Take 325 mg by mouth daily.     atorvastatin (LIPITOR) 40 MG tablet TAKE 1 TABLET EVERY DAY 90 tablet 3   diazepam (VALIUM) 5 MG tablet TAKE 1/2 TO 1 TABLET BY MOUTH TWICE A DAY AS NEEDED 30 tablet 2   HYDROcodone-acetaminophen (NORCO/VICODIN) 5-325 MG tablet Take by mouth in the morning and at bedtime.     ibuprofen (ADVIL) 200 MG tablet Take 400 mg by mouth every 6 (six) hours as needed for moderate pain or headache.     levothyroxine (SYNTHROID) 88 MCG tablet TAKE 1 TABLET EVERY DAY 90 tablet 2   Na Sulfate-K Sulfate-Mg Sulf 17.5-3.13-1.6 GM/177ML SOLN Take 1 kit by mouth as directed. May use generic Suprep 354 mL 0   Vitamin D, Ergocalciferol, (DRISDOL) 1.25 MG (50000 UNIT) CAPS capsule Take 50,000 Units by mouth once a week.     No current facility-administered medications on file prior to visit.    BP 136/88    Pulse (!) 104    Temp 98.6 F (37 C) (Oral)    Ht 5' 5"  (1.651 m)    Wt 152 lb (68.9 kg)    SpO2 94%    BMI 25.29 kg/m       Objective:   Physical Exam Vitals and nursing  note reviewed.  Constitutional:      Appearance: Normal appearance.  Cardiovascular:     Rate and Rhythm: Normal rate and regular rhythm.     Pulses: Normal pulses.     Heart sounds: Normal heart sounds.  Pulmonary:     Effort: Pulmonary effort is normal.  Breath sounds: Wheezing present.  Abdominal:     General: Abdomen is flat. Bowel sounds are normal.     Palpations: Abdomen is soft.  Neurological:     General: No focal deficit present.     Mental Status: She is alert and oriented to person, place, and time.  Psychiatric:        Mood and Affect: Mood normal.        Behavior: Behavior normal.        Thought Content: Thought content normal.        Judgment: Judgment normal.      Assessment & Plan:  1. Bronchitis  - POC COVID-19- negative  - She can not use prednisone as this causes her face to swell.  - She has an albuterol inhaler at home but has not been using it. Advised to start using.  Due to history of COPD, will prescribe Azithromycin  - azithromycin (ZITHROMAX) 250 MG tablet; Take 2 tablets on day 1, then 1 tablet daily on days 2 through 5  Dispense: 6 tablet; Refill: 0 - guaiFENesin-dextromethorphan (ROBITUSSIN DM) 100-10 MG/5ML syrup; Take 5 mLs by mouth every 4 (four) hours as needed for cough.  Dispense: 118 mL; Refill: 0  [name], NP

## 2021-10-23 NOTE — Patient Instructions (Signed)
Health Maintenance Due  Topic Date Due   Zoster Vaccines- Shingrix (1 of 2) Never done   DEXA SCAN  Never done   COVID-19 Vaccine (4 - Booster for Pfizer series) 10/09/2020   COLONOSCOPY (Pts 45-62yrs Insurance coverage will need to be confirmed)  01/06/2021   INFLUENZA VACCINE  04/09/2021    Depression screen [facility] 2/9 03/20/2021 03/08/2020 10/20/2019  Decreased Interest 0 0 0  Down, Depressed, Hopeless 0 0 0  PHQ - 2 Score 0 0 0  Altered sleeping - 0 -  Tired, decreased energy - 0 -  Change in appetite - 1 -  Feeling bad or failure about yourself  - 0 -  Trouble concentrating - 0 -  Moving slowly or fidgety/restless - 0 -  Suicidal thoughts - 0 -  PHQ-9 Score - 1 -  Difficult doing work/chores - Not difficult at all -  Some recent data might be hidden

## 2021-11-08 DIAGNOSIS — F17210 Nicotine dependence, cigarettes, uncomplicated: Secondary | ICD-10-CM | POA: Diagnosis not present

## 2021-11-08 DIAGNOSIS — F1290 Cannabis use, unspecified, uncomplicated: Secondary | ICD-10-CM | POA: Diagnosis not present

## 2021-11-08 DIAGNOSIS — M545 Low back pain, unspecified: Secondary | ICD-10-CM | POA: Diagnosis not present

## 2021-11-08 DIAGNOSIS — Z79899 Other long term (current) drug therapy: Secondary | ICD-10-CM | POA: Diagnosis not present

## 2021-11-08 DIAGNOSIS — Z6825 Body mass index (BMI) 25.0-25.9, adult: Secondary | ICD-10-CM | POA: Diagnosis not present

## 2021-11-08 DIAGNOSIS — M25561 Pain in right knee: Secondary | ICD-10-CM | POA: Diagnosis not present

## 2021-12-04 ENCOUNTER — Other Ambulatory Visit: Payer: Self-pay | Admitting: Adult Health

## 2021-12-04 DIAGNOSIS — J439 Emphysema, unspecified: Secondary | ICD-10-CM

## 2021-12-06 NOTE — Telephone Encounter (Signed)
Okay for refill?  ? ? ?LOV ?10/23/2021 ? ?Last refill  ? ?diazepam (VALIUM) 5 MG tablet 30 tablet 2 06/14/2021   ?Sig:   TAKE 1/2 TO 1 TABLET BY MOUTH TWICE A DAY AS NEEDED      ? ?

## 2021-12-10 DIAGNOSIS — F17210 Nicotine dependence, cigarettes, uncomplicated: Secondary | ICD-10-CM | POA: Diagnosis not present

## 2021-12-10 DIAGNOSIS — Z6826 Body mass index (BMI) 26.0-26.9, adult: Secondary | ICD-10-CM | POA: Diagnosis not present

## 2021-12-10 DIAGNOSIS — F1290 Cannabis use, unspecified, uncomplicated: Secondary | ICD-10-CM | POA: Diagnosis not present

## 2021-12-10 DIAGNOSIS — Z0000 Encounter for general adult medical examination without abnormal findings: Secondary | ICD-10-CM | POA: Diagnosis not present

## 2021-12-10 DIAGNOSIS — Z79899 Other long term (current) drug therapy: Secondary | ICD-10-CM | POA: Diagnosis not present

## 2021-12-10 DIAGNOSIS — M545 Low back pain, unspecified: Secondary | ICD-10-CM | POA: Diagnosis not present

## 2021-12-10 DIAGNOSIS — M25561 Pain in right knee: Secondary | ICD-10-CM | POA: Diagnosis not present

## 2021-12-10 DIAGNOSIS — F1220 Cannabis dependence, uncomplicated: Secondary | ICD-10-CM | POA: Diagnosis not present

## 2021-12-12 DIAGNOSIS — Z79899 Other long term (current) drug therapy: Secondary | ICD-10-CM | POA: Diagnosis not present

## 2022-01-09 ENCOUNTER — Ambulatory Visit (INDEPENDENT_AMBULATORY_CARE_PROVIDER_SITE_OTHER): Payer: Medicare HMO | Admitting: Adult Health

## 2022-01-09 ENCOUNTER — Encounter: Payer: Self-pay | Admitting: Adult Health

## 2022-01-09 VITALS — BP 120/88 | HR 80 | Temp 97.8°F | Ht 65.0 in | Wt 155.0 lb

## 2022-01-09 DIAGNOSIS — R1032 Left lower quadrant pain: Secondary | ICD-10-CM

## 2022-01-09 LAB — URINALYSIS
Bilirubin Urine: NEGATIVE
Hgb urine dipstick: NEGATIVE
Ketones, ur: NEGATIVE
Leukocytes,Ua: NEGATIVE
Nitrite: NEGATIVE
Specific Gravity, Urine: 1.01 (ref 1.000–1.030)
Total Protein, Urine: NEGATIVE
Urine Glucose: NEGATIVE
Urobilinogen, UA: 0.2 (ref 0.0–1.0)
pH: 6 (ref 5.0–8.0)

## 2022-01-09 LAB — CBC WITH DIFFERENTIAL/PLATELET
Basophils Absolute: 0 10*3/uL (ref 0.0–0.1)
Basophils Relative: 0.4 % (ref 0.0–3.0)
Eosinophils Absolute: 0.2 10*3/uL (ref 0.0–0.7)
Eosinophils Relative: 2.2 % (ref 0.0–5.0)
HCT: 47.8 % — ABNORMAL HIGH (ref 36.0–46.0)
Hemoglobin: 15.7 g/dL — ABNORMAL HIGH (ref 12.0–15.0)
Lymphocytes Relative: 39.6 % (ref 12.0–46.0)
Lymphs Abs: 2.8 10*3/uL (ref 0.7–4.0)
MCHC: 32.9 g/dL (ref 30.0–36.0)
MCV: 93.9 fl (ref 78.0–100.0)
Monocytes Absolute: 0.5 10*3/uL (ref 0.1–1.0)
Monocytes Relative: 6.6 % (ref 3.0–12.0)
Neutro Abs: 3.6 10*3/uL (ref 1.4–7.7)
Neutrophils Relative %: 51.2 % (ref 43.0–77.0)
Platelets: 209 10*3/uL (ref 150.0–400.0)
RBC: 5.09 Mil/uL (ref 3.87–5.11)
RDW: 13.7 % (ref 11.5–15.5)
WBC: 7 10*3/uL (ref 4.0–10.5)

## 2022-01-09 LAB — BASIC METABOLIC PANEL
BUN: 14 mg/dL (ref 6–23)
CO2: 30 mEq/L (ref 19–32)
Calcium: 9.5 mg/dL (ref 8.4–10.5)
Chloride: 103 mEq/L (ref 96–112)
Creatinine, Ser: 0.87 mg/dL (ref 0.40–1.20)
GFR: 66.18 mL/min (ref >60.00)
Glucose, Bld: 86 mg/dL (ref 70–99)
Potassium: 4.2 mEq/L (ref 3.5–5.1)
Sodium: 140 mEq/L (ref 135–145)

## 2022-01-09 LAB — AMYLASE: Amylase: 39 U/L (ref 27–131)

## 2022-01-09 LAB — LIPASE: Lipase: 30 U/L (ref 11.0–59.0)

## 2022-01-09 NOTE — Patient Instructions (Signed)
Health Maintenance Due  ?Topic Date Due  ? Zoster Vaccines- Shingrix (1 of 2) Never done  ? DEXA SCAN  Never done  ? COVID-19 Vaccine (4 - Booster for Pfizer series) 10/09/2020  ? COLONOSCOPY (Pts 45-38yr Insurance coverage will need to be confirmed)  01/06/2021  ? ? ? ? Row Labels 03/20/2021  ?  9:42 AM 03/08/2020  ?  9:56 AM 10/20/2019  ?  9:02 AM  ?Depression screen PHQ 2/9   Section Header. No data exists in this row.     ?Decreased Interest   0 0 0  ?Down, Depressed, Hopeless   0 0 0  ?PHQ - 2 Score   0 0 0  ?Altered sleeping    0   ?Tired, decreased energy    0   ?Change in appetite    1   ?Feeling bad or failure about yourself     0   ?Trouble concentrating    0   ?Moving slowly or fidgety/restless    0   ?Suicidal thoughts    0   ?PHQ-9 Score    1   ?Difficult doing work/chores    Not difficult at all   ? ? ?

## 2022-01-09 NOTE — Progress Notes (Signed)
? ?Subjective:  ? ? Patient ID: Stacy Garcia, female    DOB: August 24, 1951, 71 y.o.   MRN: [id number] ? ?HPI ?71 year old female who  has a past medical history of AAA (abdominal aortic aneurysm) ([place]), Arthritis, Blood transfusion without reported diagnosis, COPD (chronic obstructive pulmonary disease) ([address]), Dyspnea (01/28/2017), Emphysema of lung ([place]), GERD (gastroesophageal reflux disease), Hyperlipidemia, Hypothyroidism, Thyroid disease, Tobacco abuse, and Vertigo. ? ?She presents to the office today for abdominal pain.  She reports that over the last 2 to 3 months, maybe longer she has been experiencing daily left-sided abdominal pain.  His abdominal pain can last upwards to a few hours.  She has not been able to figure out a rhyme or reason for the abdominal pain.  She is unable to describe the abdominal pain but pain seems to be becoming worse and longer lasting.   She denies nausea, vomiting, diarrhea but has chronic constipation.  He has not noticed any fevers or chills. ? ?Denies hematuria, dysuria, frequency, urgency. ? ?She has history of AAA repair in 2018 and multiple hernia repairs. ? ?Diverticula without diverticulitis was noted on CT scan from 2021 ? ? ?Review of Systems ?See HPI  ? ?Past Medical History:  ?Diagnosis Date  ? AAA (abdominal aortic aneurysm) ([place])   ? Arthritis   ? Blood transfusion without reported diagnosis   ? COPD (chronic obstructive pulmonary disease) ([place])   ? Dyspnea 01/28/2017  ? just recently started wheezing  ? Emphysema of lung ([place])   ? GERD (gastroesophageal reflux disease)   ? Hyperlipidemia   ? Hypothyroidism   ? Thyroid disease   ? Tobacco abuse   ? Vertigo   ? ? ?Social History  ? ?Socioeconomic History  ? Marital status: Married  ?  Spouse name: Not on file  ? Number of children: 3  ? Years of education: Not on file  ? Highest education level: Not on file  ?Occupational History  ? Occupation: Designer/manufacturing  ?  Comment: [employer]; retired 2017  ?  Occupation: Retired  ?  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 Smoking status: Former  ?  Packs/day: 1.00  ?  Years: 50.00  ?  Pack years: 50.00  ?  Types: Cigarettes  ?  Quit date: 12/31/2016  ?  Years since quitting: 5.0  ? Smokeless tobacco: Never  ?Vaping Use  ? Vaping Use: Never used  ?Substance and Sexual Activity  ? Alcohol use: No  ?  Alcohol/week: 0.0 standard drinks  ? Drug use: No  ? Sexual activity: Not Currently  ?Other Topics Concern  ? Not on file  ?Social History Narrative  ? 11/03/2018:  ? Lives with husband in 2 story home  ? Has 3 children, 7 grandchildren, 9 great-grandchildren, all of whom live close by  ? Takes care of two of her great grandchildren (9 yo and 33yo)  ? [place] working, 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  ? ?Social Determinants of Health  ? ?Financial Resource Strain: Low Risk   ? Difficulty of Paying Living Expenses: Not hard at all  ?Food Insecurity: No Food Insecurity  ? Worried About Charity fundraiser in the Last Year: Never true  ? Ran Out of Food in the Last Year: Never true  ?Transportation Needs: No Transportation Needs  ? Lack of Transportation (Medical): No  ? Lack of Transportation (Non-Medical): No  ?Physical Activity: Inactive  ? Days of Exercise per  Week: 0 days  ? Minutes of Exercise per Session: 0 min  ?Stress: No Stress Concern Present  ? Feeling of Stress : Not at all  ?Social Connections: Socially Integrated  ? Frequency of Communication with Friends and Family: Once a week  ? Frequency of Social Gatherings with Friends and Family: More than three times a week  ? Attends Religious Services: More than 4 times per year  ? Active Member of Clubs or Organizations: Yes  ? Attends Archivist Meetings: 1 to 4 times per year  ? Marital Status: Married  ?Intimate Partner Violence: Not At Risk  ? Fear of Current or Ex-Partner: No  ? Emotionally Abused: No  ? Physically Abused: No  ? Sexually Abused: No  ? ? ?Past  Surgical History:  ?Procedure Laterality Date  ? ABDOMINAL AORTIC ANEURYSM REPAIR N/A 02/05/2017  ? Procedure: ANEURYSM ABDOMINAL AORTIC REPAIR;  Surgeon: Elam Dutch, MD;  [facility];  Service: Vascular;  Laterality: N/A;  ? CESAREAN SECTION    ? x 3  ? INSERTION OF MESH N/A 09/13/2020  ? Procedure: INSERTION OF MESH;  Surgeon: Ralene Ok, MD;  Location: [facility];  Service: General;  Laterality: N/A;  ? JOINT REPLACEMENT    ? ROBOTIC INCISIONAL HERNIA REPAIR WITH MESH RETRORECTUS REPAIR (N/  09/13/2020  ? SHOULDER SURGERY  02/2015  ? TOTAL KNEE ARTHROPLASTY Right 01/23/2016  ? Procedure: TOTAL KNEE ARTHROPLASTY;  Surgeon: Garald Balding, MD;  Location: [place];  Service: Orthopedics;  Laterality: Right;  ? TUBAL LIGATION    ? XI ROBOTIC ASSISTED VENTRAL HERNIA N/A 09/13/2020  ? Procedure: ROBOTIC INCISIONAL HERNIA REPAIR WITH MESH;  Surgeon: Ralene Ok, MD;  Location: [place];  Service: General;  Laterality: N/A;  ? ? ?Family History  ?Problem Relation Age of Onset  ? Thyroid disease Other   ? Diabetes Other   ? Hyperlipidemia Other   ? Hypertension Other   ? Colon cancer Neg Hx   ? Colon polyps Neg Hx   ? Esophageal cancer Neg Hx   ? Rectal cancer Neg Hx   ? Stomach cancer Neg Hx   ? ? ?Allergies  ?Allergen Reactions  ? Prednisone   ?  "Swelling of face"  ? ? ?Current Outpatient Medications on File Prior to Visit  ?Medication Sig Dispense Refill  ? aspirin 325 MG EC tablet Take 325 mg by mouth daily.    ? atorvastatin (LIPITOR) 40 MG tablet TAKE 1 TABLET EVERY DAY 90 tablet 3  ? diazepam (VALIUM) 5 MG tablet TAKE 1/2 TO 1 TABLET BY MOUTH TWICE A DAY AS NEEDED 30 tablet 2  ? guaiFENesin-dextromethorphan (ROBITUSSIN DM) 100-10 MG/5ML syrup Take 5 mLs by mouth every 4 (four) hours as needed for cough. 118 mL 0  ? HYDROcodone-acetaminophen (NORCO/VICODIN) 5-325 MG tablet Take by mouth in the morning and at bedtime.    ? ibuprofen (ADVIL) 200 MG tablet Take 400 mg by mouth every 6 (six) hours as needed for  moderate pain or headache.    ? levothyroxine (SYNTHROID) 88 MCG tablet TAKE 1 TABLET EVERY DAY 90 tablet 2  ? Na Sulfate-K Sulfate-Mg Sulf 17.5-3.13-1.6 GM/177ML SOLN Take 1 kit by mouth as directed. May use generic Suprep 354 mL 0  ? Vitamin D, Ergocalciferol, (DRISDOL) 1.25 MG (50000 UNIT) CAPS capsule Take 50,000 Units by mouth once a week.    ? ?No current facility-administered medications on file prior to visit.  ? ? ?BP 120/88   Pulse  80   Temp 97.8 ?F (36.6 ?C) (Oral)   Ht 5' 5" (1.651 m)   Wt 155 lb (70.3 kg)   SpO2 95%   BMI 25.79 kg/m?  ? ? ?   ?Objective:  ? Physical Exam ?Vitals and nursing note reviewed.  ?Constitutional:   ?   Appearance: She is well-developed.  ?Cardiovascular:  ?   Rate and Rhythm: Normal rate and regular rhythm.  ?   Pulses: Normal pulses.  ?   Heart sounds: Normal heart sounds.  ?Pulmonary:  ?   Breath sounds: Normal breath sounds.  ?Abdominal:  ?   General: Abdomen is flat. Bowel sounds are normal. There is no distension.  ?   Palpations: Abdomen is soft. There is no mass.  ?   Tenderness: There is abdominal tenderness in the left upper quadrant and left lower quadrant. There is no guarding or rebound.  ?   Hernia: No hernia is present.  ?Neurological:  ?   Mental Status: She is alert.  ? ? ?   ?Assessment & Plan:  ?1. Left lower quadrant abdominal pain ?- unknown cause at this time. Will check lab work and order CT scan due to past history and duration.  ?- CBC with Differential/Platelet; Future ?- Basic Metabolic Panel; Future ?- Urinalysis; Future ?- CT Abdomen Pelvis Wo Contrast; Future ?- Amylase; Future ?- Lipase; Future ?- Lipase ?- Amylase ?- Urinalysis ?- Basic Metabolic Panel ?- CBC with Differential/Platelet ? ?[name], NP ? ?Time of total for care on the day of the encounter 30 minutes. This includes time spent in both face-to-face and non-face-to-face activities including preparing for the visit, reviewing the chart, time spent with patient, evaluation  and counseling patient/family/caregiver, coordinating care, and time spent documenting in the chart which was performed on the date of service (01/09/2022). Note: this excludes any time spent performing billable pr

## 2022-01-14 DIAGNOSIS — M545 Low back pain, unspecified: Secondary | ICD-10-CM | POA: Diagnosis not present

## 2022-01-14 DIAGNOSIS — Z79899 Other long term (current) drug therapy: Secondary | ICD-10-CM | POA: Diagnosis not present

## 2022-01-14 DIAGNOSIS — Z6825 Body mass index (BMI) 25.0-25.9, adult: Secondary | ICD-10-CM | POA: Diagnosis not present

## 2022-01-14 DIAGNOSIS — M25561 Pain in right knee: Secondary | ICD-10-CM | POA: Diagnosis not present

## 2022-01-16 DIAGNOSIS — Z79899 Other long term (current) drug therapy: Secondary | ICD-10-CM | POA: Diagnosis not present

## 2022-01-22 ENCOUNTER — Encounter: Payer: Self-pay | Admitting: Adult Health

## 2022-01-22 ENCOUNTER — Ambulatory Visit (INDEPENDENT_AMBULATORY_CARE_PROVIDER_SITE_OTHER): Payer: Medicare HMO | Admitting: Adult Health

## 2022-01-22 VITALS — BP 110/70 | HR 64 | Temp 97.6°F | Ht 64.5 in | Wt 154.0 lb

## 2022-01-22 DIAGNOSIS — E782 Mixed hyperlipidemia: Secondary | ICD-10-CM

## 2022-01-22 DIAGNOSIS — I714 Abdominal aortic aneurysm, without rupture, unspecified: Secondary | ICD-10-CM | POA: Diagnosis not present

## 2022-01-22 DIAGNOSIS — M5116 Intervertebral disc disorders with radiculopathy, lumbar region: Secondary | ICD-10-CM | POA: Diagnosis not present

## 2022-01-22 DIAGNOSIS — R1032 Left lower quadrant pain: Secondary | ICD-10-CM

## 2022-01-22 DIAGNOSIS — E018 Other iodine-deficiency related thyroid disorders and allied conditions: Secondary | ICD-10-CM

## 2022-01-22 DIAGNOSIS — J439 Emphysema, unspecified: Secondary | ICD-10-CM | POA: Diagnosis not present

## 2022-01-22 DIAGNOSIS — H8110 Benign paroxysmal vertigo, unspecified ear: Secondary | ICD-10-CM | POA: Diagnosis not present

## 2022-01-22 DIAGNOSIS — E559 Vitamin D deficiency, unspecified: Secondary | ICD-10-CM

## 2022-01-22 DIAGNOSIS — Z0000 Encounter for general adult medical examination without abnormal findings: Secondary | ICD-10-CM

## 2022-01-22 LAB — LIPID PANEL
Cholesterol: 175 mg/dL (ref 0–200)
HDL: 51.3 mg/dL (ref >39.00)
LDL Cholesterol: 98 mg/dL (ref 0–99)
NonHDL: 123.88
Total CHOL/HDL Ratio: 3
Triglycerides: 130 mg/dL (ref 0.0–149.0)
VLDL: 26 mg/dL (ref 0.0–40.0)

## 2022-01-22 LAB — COMPREHENSIVE METABOLIC PANEL
ALT: 17 U/L (ref 0–35)
AST: 18 U/L (ref 0–37)
Albumin: 4.5 g/dL (ref 3.5–5.2)
Alkaline Phosphatase: 84 U/L (ref 39–117)
BUN: 16 mg/dL (ref 6–23)
CO2: 28 mEq/L (ref 19–32)
Calcium: 9.8 mg/dL (ref 8.4–10.5)
Chloride: 103 mEq/L (ref 96–112)
Creatinine, Ser: 0.95 mg/dL (ref 0.40–1.20)
GFR: 59.53 mL/min — ABNORMAL LOW (ref >60.00)
Glucose, Bld: 103 mg/dL — ABNORMAL HIGH (ref 70–99)
Potassium: 4 mEq/L (ref 3.5–5.1)
Sodium: 141 mEq/L (ref 135–145)
Total Bilirubin: 0.9 mg/dL (ref 0.2–1.2)
Total Protein: 7.3 g/dL (ref 6.0–8.3)

## 2022-01-22 LAB — CBC WITH DIFFERENTIAL/PLATELET
Basophils Absolute: 0 10*3/uL (ref 0.0–0.1)
Basophils Relative: 0.5 % (ref 0.0–3.0)
Eosinophils Absolute: 0.1 10*3/uL (ref 0.0–0.7)
Eosinophils Relative: 0.7 % (ref 0.0–5.0)
HCT: 48.5 % — ABNORMAL HIGH (ref 36.0–46.0)
Hemoglobin: 16.2 g/dL — ABNORMAL HIGH (ref 12.0–15.0)
Lymphocytes Relative: 32.4 % (ref 12.0–46.0)
Lymphs Abs: 2.9 10*3/uL (ref 0.7–4.0)
MCHC: 33.5 g/dL (ref 30.0–36.0)
MCV: 94.4 fl (ref 78.0–100.0)
Monocytes Absolute: 0.4 10*3/uL (ref 0.1–1.0)
Monocytes Relative: 4.7 % (ref 3.0–12.0)
Neutro Abs: 5.6 10*3/uL (ref 1.4–7.7)
Neutrophils Relative %: 61.7 % (ref 43.0–77.0)
Platelets: 200 10*3/uL (ref 150.0–400.0)
RBC: 5.14 Mil/uL — ABNORMAL HIGH (ref 3.87–5.11)
RDW: 13.6 % (ref 11.5–15.5)
WBC: 9.1 10*3/uL (ref 4.0–10.5)

## 2022-01-22 LAB — VITAMIN D 25 HYDROXY (VIT D DEFICIENCY, FRACTURES): VITD: 35.56 ng/mL (ref 30.00–100.00)

## 2022-01-22 LAB — TSH: TSH: 0.23 u[IU]/mL — ABNORMAL LOW (ref 0.35–5.50)

## 2022-01-22 NOTE — Patient Instructions (Signed)
It was great seeing you today  ? ?We will follow up with you regarding your lab work  ? ?Please let me know if you need anything  ? ?[place] [facility] - [phone] ? ?

## 2022-01-22 NOTE — Progress Notes (Signed)
? ?Subjective:  ? ? Patient ID: Stacy Garcia, female    DOB: 09/19/1950, 71 y.o.   MRN: [id number] ? ?HPI ? ?Patient presents for yearly preventative medicine examination. She is a pleasant 71 year old female who  has a past medical history of AAA (abdominal aortic aneurysm) ([place]), Arthritis, Blood transfusion without reported diagnosis, COPD (chronic obstructive pulmonary disease) ([place]), Dyspnea (01/28/2017), Emphysema of lung ([place]), GERD (gastroesophageal reflux disease), Hyperlipidemia, Hypothyroidism, Thyroid disease, Tobacco abuse, and Vertigo. ? ?Hypothyroidism - takes synthroid 100 mcg daily. She feels controlled on this dose  ?Lab Results  ?Component Value Date  ? TSH 1.27 11/10/2020  ? ?Hyperlipidemia - managed with lipitor 40 mg and ASA 81 mg daily. She denies myalgia or fatigue  ? ?Lab Results  ?Component Value Date  ? CHOL 182 11/10/2020  ? HDL 52.00 11/10/2020  ? [place] 97 11/10/2020  ? LDLDIRECT 132.0 09/15/2018  ? TRIG 165.0 (H) 11/10/2020  ? CHOLHDL 3 11/10/2020  ? ? ? ?COPD - is followed by pulmonary on a routine basis. She is not currently using any inhalers and feels well controlled without them  ? ?H/o Vertigo- takes Valium 2.5 mg to 5 mg PRN  ? ?AAA- has been stable at 3 cm - s/p repair  ? ?Vitamin D Deficiency - was on Vitamin D 50,000 units weekly but stopped taking this. This was prescribed by pain management  ? ?Chronic low back pain - managed by pain management - Taking Norco 7.5 mg  ? ?Left lower quadrant abdominal pain - was seen for this 2 weeks ago. Started roughly 2-3 months ago. Blood work was unremarkable at this time. Awaiting CT. She reports that much has changed. She continues to have periodic left lower quadrant pain that can last upwards of an hor.  ? ? ?All immunizations and health maintenance protocols were reviewed with the patient and needed orders were placed. ? ?Appropriate screening laboratory values were ordered for the patient including screening of hyperlipidemia,  renal function and hepatic function. ? ? ?Medication reconciliation,  past medical history, social history, problem list and allergies were reviewed in detail with the patient ? ?Goals were established with regard to weight loss, exercise, and  diet in compliance with medications ? ?She refuses colonoscopy due to price of prep. She is not a candidate for cologuard  ? ?Review of Systems  ?Constitutional: Negative.   ?HENT: Negative.    ?Eyes: Negative.   ?Respiratory: Negative.    ?Cardiovascular: Negative.   ?Gastrointestinal:  Positive for abdominal pain.  ?Endocrine: Negative.   ?Genitourinary: Negative.   ?Musculoskeletal: Negative.   ?Skin: Negative.   ?Allergic/Immunologic: Negative.   ?Neurological: Negative.   ?Hematological: Negative.   ?Psychiatric/Behavioral: Negative.    ? ?Past Medical History:  ?Diagnosis Date  ? AAA (abdominal aortic aneurysm) ([place])   ? Arthritis   ? Blood transfusion without reported diagnosis   ? COPD (chronic obstructive pulmonary disease) ([place])   ? Dyspnea 01/28/2017  ? just recently started wheezing  ? Emphysema of lung ([place])   ? GERD (gastroesophageal reflux disease)   ? Hyperlipidemia   ? Hypothyroidism   ? Thyroid disease   ? Tobacco abuse   ? Vertigo   ? ? ?Social History  ? ?Socioeconomic History  ? Marital status: Married  ?  Spouse name: Not on file  ? Number of children: 3  ? Years of education: Not on file  ? Highest education level: Not on file  ?Occupational History  ?  Occupation: Designer/manufacturing  ?  Comment: [employer]; retired 2017  ? Occupation: Retired  ?  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 Smoking status: Former  ?  Packs/day: 1.00  ?  Years: 50.00  ?  Pack years: 50.00  ?  Types: Cigarettes  ?  Quit date: 12/31/2016  ?  Years since quitting: 5.0  ? Smokeless tobacco: Never  ?Vaping Use  ? Vaping Use: Never used  ?Substance and Sexual Activity  ? Alcohol use: No  ?  Alcohol/week: 0.0 standard drinks  ? Drug use: No  ? Sexual activity: Not  Currently  ?Other Topics Concern  ? Not on file  ?Social History Narrative  ? 11/03/2018:  ? Lives with husband in 2 story home  ? Has 3 children, 7 grandchildren, 9 great-grandchildren, all of whom live close by  ? Takes care of two of her great grandchildren (19 yo and 9yo)  ? [place] working, 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  ? ?Social Determinants of Health  ? ?Financial Resource Strain: Low Risk   ? Difficulty of Paying Living Expenses: Not hard at all  ?Food Insecurity: No Food Insecurity  ? Worried About Charity fundraiser in the Last Year: Never true  ? Ran Out of Food in the Last Year: Never true  ?Transportation Needs: No Transportation Needs  ? Lack of Transportation (Medical): No  ? Lack of Transportation (Non-Medical): No  ?Physical Activity: Inactive  ? Days of Exercise per Week: 0 days  ? Minutes of Exercise per Session: 0 min  ?Stress: No Stress Concern Present  ? Feeling of Stress : Not at all  ?Social Connections: Socially Integrated  ? Frequency of Communication with Friends and Family: Once a week  ? Frequency of Social Gatherings with Friends and Family: More than three times a week  ? Attends Religious Services: More than 4 times per year  ? Active Member of Clubs or Organizations: Yes  ? Attends Archivist Meetings: 1 to 4 times per year  ? Marital Status: Married  ?Intimate Partner Violence: Not At Risk  ? Fear of Current or Ex-Partner: No  ? Emotionally Abused: No  ? Physically Abused: No  ? Sexually Abused: No  ? ? ?Past Surgical History:  ?Procedure Laterality Date  ? ABDOMINAL AORTIC ANEURYSM REPAIR N/A 02/05/2017  ? Procedure: ANEURYSM ABDOMINAL AORTIC REPAIR;  Surgeon: Elam Dutch, MD;  Location: [facility] OR;  Service: Vascular;  Laterality: N/A;  ? CESAREAN SECTION    ? x 3  ? INSERTION OF MESH N/A 09/13/2020  ? Procedure: INSERTION OF MESH;  Surgeon: Ralene Ok, MD;  Location: [place];  Service: General;  Laterality: N/A;  ?  JOINT REPLACEMENT    ? ROBOTIC INCISIONAL HERNIA REPAIR WITH MESH RETRORECTUS REPAIR (N/  09/13/2020  ? SHOULDER SURGERY  02/2015  ? TOTAL KNEE ARTHROPLASTY Right 01/23/2016  ? Procedure: TOTAL KNEE ARTHROPLASTY;  Surgeon: Garald Balding, MD;  Location: [place];  Service: Orthopedics;  Laterality: Right;  ? TUBAL LIGATION    ? XI ROBOTIC ASSISTED VENTRAL HERNIA N/A 09/13/2020  ? Procedure: ROBOTIC INCISIONAL HERNIA REPAIR WITH MESH;  Surgeon: Ralene Ok, MD;  Location: [facility] [facility];  Service: General;  Laterality: N/A;  ? ? ?Family History  ?Problem Relation Age of Onset  ? Thyroid disease Other   ? Diabetes Other   ? Hyperlipidemia Other   ? Hypertension Other   ? Colon cancer Neg  Hx   ? Colon polyps Neg Hx   ? Esophageal cancer Neg Hx   ? Rectal cancer Neg Hx   ? Stomach cancer Neg Hx   ? ? ?Allergies  ?Allergen Reactions  ? Prednisone   ?  "Swelling of face"  ? ? ?Current Outpatient Medications on File Prior to Visit  ?Medication Sig Dispense Refill  ? aspirin 325 MG EC tablet Take 325 mg by mouth daily.    ? atorvastatin (LIPITOR) 40 MG tablet TAKE 1 TABLET EVERY DAY 90 tablet 3  ? diazepam (VALIUM) 5 MG tablet TAKE 1/2 TO 1 TABLET BY MOUTH TWICE A DAY AS NEEDED 30 tablet 2  ? guaiFENesin-dextromethorphan (ROBITUSSIN DM) 100-10 MG/5ML syrup Take 5 mLs by mouth every 4 (four) hours as needed for cough. 118 mL 0  ? HYDROcodone-acetaminophen (NORCO/VICODIN) 5-325 MG tablet Take by mouth in the morning and at bedtime.    ? ibuprofen (ADVIL) 200 MG tablet Take 400 mg by mouth every 6 (six) hours as needed for moderate pain or headache.    ? levothyroxine (SYNTHROID) 88 MCG tablet TAKE 1 TABLET EVERY DAY 90 tablet 2  ? Na Sulfate-K Sulfate-Mg Sulf 17.5-3.13-1.6 GM/177ML SOLN Take 1 kit by mouth as directed. May use generic Suprep 354 mL 0  ? Vitamin D, Ergocalciferol, (DRISDOL) 1.25 MG (50000 UNIT) CAPS capsule Take 50,000 Units by mouth once a week.    ? ?No current facility-administered medications on file prior to  visit.  ? ? ?BP 110/70   Pulse 64   Temp 97.6 ?F (36.4 ?C) (Oral)   Ht 5' 4.5" (1.638 m)   Wt 154 lb (69.9 kg)   SpO2 97%   BMI 26.03 kg/m?  ? ? ?   ?Objective:  ? Physical Exam ?Vitals and nursing note reviewed.  ?

## 2022-01-23 ENCOUNTER — Other Ambulatory Visit: Payer: Self-pay

## 2022-01-23 DIAGNOSIS — E018 Other iodine-deficiency related thyroid disorders and allied conditions: Secondary | ICD-10-CM

## 2022-02-06 ENCOUNTER — Ambulatory Visit (INDEPENDENT_AMBULATORY_CARE_PROVIDER_SITE_OTHER)
Admission: RE | Admit: 2022-02-06 | Discharge: 2022-02-06 | Disposition: A | Discharge status: Home / Self Care | Payer: Medicare HMO | Source: Ambulatory Visit | Attending: Adult Health | Admitting: Adult Health

## 2022-02-06 ENCOUNTER — Ambulatory Visit (INDEPENDENT_AMBULATORY_CARE_PROVIDER_SITE_OTHER): Payer: Medicare HMO | Admitting: Adult Health

## 2022-02-06 ENCOUNTER — Encounter: Payer: Self-pay | Admitting: Adult Health

## 2022-02-06 VITALS — BP 128/60 | HR 61 | Temp 98.1°F | Ht 64.5 in | Wt 157.0 lb

## 2022-02-06 DIAGNOSIS — M5441 Lumbago with sciatica, right side: Secondary | ICD-10-CM

## 2022-02-06 DIAGNOSIS — R109 Unspecified abdominal pain: Secondary | ICD-10-CM | POA: Diagnosis not present

## 2022-02-06 DIAGNOSIS — R1032 Left lower quadrant pain: Secondary | ICD-10-CM

## 2022-02-06 MED ORDER — CYCLOBENZAPRINE HCL 10 MG PO TABS: 10.0000 mg | ORAL_TABLET | Freq: Three times a day (TID) | ORAL | 0 refills | Status: DC | PRN

## 2022-02-06 MED ORDER — PREDNISONE 20 MG PO TABS: 20.0000 mg | ORAL_TABLET | Freq: Every day | ORAL | 0 refills | Status: DC

## 2022-02-06 MED ORDER — LEVOTHYROXINE SODIUM 75 MCG PO TABS: 75.0000 ug | ORAL_TABLET | Freq: Every day | ORAL | 3 refills | Status: DC

## 2022-02-06 NOTE — Progress Notes (Signed)
Subjective:    Patient ID: Stacy Garcia, female    DOB: 1950-12-05, 71 y.o.   MRN: [id number]  HPI 71 year old female who  has a past medical history of AAA (abdominal aortic aneurysm) ([place]), Arthritis, Blood transfusion without reported diagnosis, COPD (chronic obstructive pulmonary disease) ([place]), Dyspnea (01/28/2017), Emphysema of lung ([place]), GERD (gastroesophageal reflux disease), Hyperlipidemia, Hypothyroidism, Thyroid disease, Tobacco abuse, and Vertigo.  She presents to the office today for an acute issue of right sided low back pain with pain radiating down the outside of her right leg to her knee. [place] Kitchen  Her symptoms started roughly 3 to 4 days ago.  She has been taking ibuprofen and her prescribed Norco which does not help.  Pain is worse when ambulating.  She does report feeling a "catching sensation in her lower back.  She denies any trauma or falls.  She is unable to describe the pain but states that " it hurts"     Review of Systems See HPI   Past Medical History:  Diagnosis Date   AAA (abdominal aortic aneurysm) ([place])    Arthritis    Blood transfusion without reported diagnosis    COPD (chronic obstructive pulmonary disease) ([place])    Dyspnea 01/28/2017   just recently started wheezing   Emphysema of lung ([facility])    GERD (gastroesophageal reflux disease)    Hyperlipidemia    Hypothyroidism    Thyroid disease    Tobacco abuse    Vertigo     Social History   Socioeconomic History   Marital status: Married    Spouse name: Not on file   Number of children: 3   Years of education: Not on file   Highest education level: Not on file  Occupational History   Occupation: Designer/manufacturing    Comment: Librarian, academic; retired 2017   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  Tobacco Use   Smoking status: Former    Packs/day: 1.00    Years: 50.00    Pack years: 50.00    Types: Cigarettes    Quit date: 12/31/2016    Years since quitting: 5.1   Smokeless  tobacco: Never  Vaping Use   Vaping Use: Never used  Substance and Sexual Activity   Alcohol use: No    Alcohol/week: 0.0 standard drinks   Drug use: No   Sexual activity: Not Currently  Other Topics Concern   Not on file  Social History Narrative   11/03/2018:   Lives with husband in 2 story home   Has 3 children, 7 grandchildren, 6 great-grandchildren, all of whom live close by   Takes care of two of her great grandchildren (69 yo and 49yo)   Horticulturist, commercial working, 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   Social Determinants of Health   Financial Resource Strain: Low Risk    Difficulty of Paying Living Expenses: Not hard at all  Food Insecurity: No Food Insecurity   Worried About Charity fundraiser in the Last Year: Never true   [place] in the Last Year: Never true  Transportation Needs: No Transportation Needs   Lack of Transportation (Medical): No   Lack of Transportation (Non-Medical): No  Physical Activity: Inactive   Days of Exercise per Week: 0 days   Minutes of Exercise per Session: 0 min  Stress: No Stress Concern Present   Feeling of Stress : Not at all  Social Connections: Socially Integrated  Frequency of Communication with Friends and Family: Once a week   Frequency of Social Gatherings with Friends and Family: More than three times a week   Attends Religious Services: More than 4 times per year   Active Member of Clubs or Organizations: Yes   Attends Archivist Meetings: 1 to 4 times per year   Marital Status: Married  Human resources officer Violence: Not At Risk   Fear of Current or Ex-Partner: No   Emotionally Abused: No   Physically Abused: No   Sexually Abused: No    Past Surgical History:  Procedure Laterality Date   ABDOMINAL AORTIC ANEURYSM REPAIR N/A 02/05/2017   Procedure: ANEURYSM ABDOMINAL AORTIC REPAIR;  Surgeon: Elam Dutch, MD;  Location: [facility];  Service: Vascular;  Laterality: N/A;    CESAREAN SECTION     x 3   INSERTION OF MESH N/A 09/13/2020   Procedure: INSERTION OF MESH;  Surgeon: Ralene Ok, MD;  Location: [facility];  Service: General;  Laterality: N/A;   JOINT REPLACEMENT     ROBOTIC [place] (N/  09/13/2020   SHOULDER SURGERY  02/2015   TOTAL KNEE ARTHROPLASTY Right 01/23/2016   Procedure: TOTAL KNEE ARTHROPLASTY;  Surgeon: Garald Balding, MD;  Location: [facility];  Service: Orthopedics;  Laterality: Right;   TUBAL LIGATION     XI ROBOTIC ASSISTED VENTRAL HERNIA N/A 09/13/2020   Procedure: ROBOTIC INCISIONAL HERNIA REPAIR WITH MESH;  Surgeon: Ralene Ok, MD;  Location: [facility];  Service: General;  Laterality: N/A;    Family History  Problem Relation Age of Onset   Thyroid disease Other    Diabetes Other    Hyperlipidemia Other    Hypertension Other    Colon cancer Neg Hx    Colon polyps Neg Hx    Esophageal cancer Neg Hx    Rectal cancer Neg Hx    Stomach cancer Neg Hx     Allergies  Allergen Reactions   Prednisone     "Swelling of face"    Current Outpatient Medications on File Prior to Visit  Medication Sig Dispense Refill   aspirin 325 MG EC tablet Take 325 mg by mouth daily.     atorvastatin (LIPITOR) 40 MG tablet TAKE 1 TABLET EVERY DAY 90 tablet 3   diazepam (VALIUM) 5 MG tablet TAKE 1/2 TO 1 TABLET BY MOUTH TWICE A DAY AS NEEDED 30 tablet 2   guaiFENesin-dextromethorphan (ROBITUSSIN DM) 100-10 MG/5ML syrup Take 5 mLs by mouth every 4 (four) hours as needed for cough. 118 mL 0   HYDROcodone-acetaminophen (NORCO/VICODIN) 5-325 MG tablet Take by mouth in the morning and at bedtime.     ibuprofen (ADVIL) 200 MG tablet Take 400 mg by mouth every 6 (six) hours as needed for moderate pain or headache.     Na Sulfate-K Sulfate-Mg Sulf 17.5-3.13-1.6 GM/177ML SOLN Take 1 kit by mouth as directed. May use generic Suprep 354 mL 0   Vitamin D, Ergocalciferol, (DRISDOL) 1.25 MG (50000 UNIT) CAPS capsule Take  50,000 Units by mouth once a week.     No current facility-administered medications on file prior to visit.    BP 128/60   Pulse 61   Temp 98.1 F (36.7 C) (Oral)   Ht 5' 4.5" (1.638 m)   Wt 157 lb (71.2 kg)   SpO2 95%   BMI 26.53 kg/m       Objective:   Physical Exam Vitals and nursing note reviewed.  Constitutional:      Appearance: Normal appearance.  Cardiovascular:     Rate and Rhythm: Normal rate and regular rhythm.     Pulses: Normal pulses.     Heart sounds: Normal heart sounds.  Pulmonary:     Effort: Pulmonary effort is normal.     Breath sounds: Normal breath sounds.  Musculoskeletal:        General: Tenderness present.     Lumbar back: Tenderness present. No bony tenderness. Decreased range of motion.       Back:  Skin:    General: Skin is warm and dry.  Neurological:     General: No focal deficit present.     Mental Status: She is alert and oriented to person, place, and time.  Psychiatric:        Mood and Affect: Mood normal.        Behavior: Behavior normal.        Thought Content: Thought content normal.        Judgment: Judgment normal.       Assessment & Plan:  1. Acute right-sided low back pain with right-sided sciatica -Symptoms consistent with right-sided sciatica.  She does have some lower lumbar paraspinal muscle tenderness.  Will prescribe a short course of Flexeril and prednisone to help with symptom relief.  She was advised to follow-up in 2 to 3 days if not improving - cyclobenzaprine (FLEXERIL) 10 MG tablet; Take 1 tablet (10 mg total) by mouth 3 (three) times daily as needed for muscle spasms.  Dispense: 30 tablet; Refill: 0 - predniSONE (DELTASONE) 20 MG tablet; Take 1 tablet (20 mg total) by mouth daily with breakfast.  Dispense: 7 tablet; Refill: 0  [name], NP

## 2022-02-12 DIAGNOSIS — Z79899 Other long term (current) drug therapy: Secondary | ICD-10-CM | POA: Diagnosis not present

## 2022-02-12 DIAGNOSIS — M25561 Pain in right knee: Secondary | ICD-10-CM | POA: Diagnosis not present

## 2022-02-12 DIAGNOSIS — M545 Low back pain, unspecified: Secondary | ICD-10-CM | POA: Diagnosis not present

## 2022-02-12 DIAGNOSIS — Z6825 Body mass index (BMI) 25.0-25.9, adult: Secondary | ICD-10-CM | POA: Diagnosis not present

## 2022-02-15 DIAGNOSIS — Z79899 Other long term (current) drug therapy: Secondary | ICD-10-CM | POA: Diagnosis not present

## 2022-03-05 ENCOUNTER — Telehealth: Payer: Self-pay | Admitting: Adult Health

## 2022-03-05 DIAGNOSIS — F526 Dyspareunia not due to a substance or known physiological condition: Secondary | ICD-10-CM

## 2022-03-05 NOTE — Telephone Encounter (Signed)
Last CPE 01/22/22 Referral placed for reason specified.

## 2022-03-05 NOTE — Telephone Encounter (Signed)
Pt is calling and would like a referral to GYN due to painful intercourse. Pt has [employer] medicare and per pt does need a referral

## 2022-03-16 DIAGNOSIS — M25561 Pain in right knee: Secondary | ICD-10-CM | POA: Diagnosis not present

## 2022-03-16 DIAGNOSIS — M545 Low back pain, unspecified: Secondary | ICD-10-CM | POA: Diagnosis not present

## 2022-03-16 DIAGNOSIS — Z6825 Body mass index (BMI) 25.0-25.9, adult: Secondary | ICD-10-CM | POA: Diagnosis not present

## 2022-03-16 DIAGNOSIS — Z79899 Other long term (current) drug therapy: Secondary | ICD-10-CM | POA: Diagnosis not present

## 2022-03-19 ENCOUNTER — Ambulatory Visit (INDEPENDENT_AMBULATORY_CARE_PROVIDER_SITE_OTHER): Payer: Medicare HMO | Admitting: Obstetrics and Gynecology

## 2022-03-19 ENCOUNTER — Encounter: Payer: Self-pay | Admitting: Obstetrics and Gynecology

## 2022-03-19 VITALS — BP 122/82 | HR 80 | Ht 64.0 in | Wt 157.0 lb

## 2022-03-19 DIAGNOSIS — N952 Postmenopausal atrophic vaginitis: Secondary | ICD-10-CM

## 2022-03-19 DIAGNOSIS — N941 Unspecified dyspareunia: Secondary | ICD-10-CM | POA: Diagnosis not present

## 2022-03-19 DIAGNOSIS — N762 Acute vulvitis: Secondary | ICD-10-CM

## 2022-03-19 DIAGNOSIS — Z79899 Other long term (current) drug therapy: Secondary | ICD-10-CM | POA: Diagnosis not present

## 2022-03-19 LAB — WET PREP FOR TRICH, YEAST, CLUE

## 2022-03-19 MED ORDER — ESTRADIOL 10 MCG VA TABS: ORAL_TABLET | VAGINAL | 4 refills | Status: DC

## 2022-03-19 MED ORDER — BETAMETHASONE VALERATE 0.1 % EX OINT: 1.0000 | TOPICAL_OINTMENT | Freq: Two times a day (BID) | CUTANEOUS | 0 refills | Status: DC

## 2022-03-19 NOTE — Progress Notes (Signed)
71 y.o. No obstetric history on file. Married White or Caucasian Not Hispanic or Latino female here for pain with intercourse. She has been having pain with intercourse for about 5 years. Very painful on entry, hurts the whole time. Using Vaseline for a lubricant. No vaginal bleeding.    No LMP recorded. Patient is postmenopausal.          Sexually active: Yes.    The current method of family planning is tubal ligation.    Exercising: No.  The patient does not participate in regular exercise at present. Smoker:  no  Health Maintenance: Pap:  02/03/18 Neg  History of abnormal Pap:  no MMG:  09/15/21 Bi-rads 1 neg  BMD:   never  Colonoscopy: 01/06/18 polyps f/u 3 years  TDaP:  02/24/12  Gardasil: n/a   reports that she quit smoking about 5 years ago. Her smoking use included cigarettes. She has a 50.00 pack-year smoking history. She has never used smokeless tobacco. She reports that she does not drink alcohol and does not use drugs. Retired. 3 children, 8 grandchildren, 9 grandchildren.   Past Medical History:  Diagnosis Date   AAA (abdominal aortic aneurysm) ([place])    Arthritis    Blood transfusion without reported diagnosis    COPD (chronic obstructive pulmonary disease) ([place])    Dysmenorrhea    Dyspnea 01/28/2017   just recently started wheezing   Emphysema of lung ([facility])    GERD (gastroesophageal reflux disease)    Hyperlipidemia    Hypothyroidism    Thyroid disease    Tobacco abuse    Vertigo     Past Surgical History:  Procedure Laterality Date   ABDOMINAL AORTIC ANEURYSM REPAIR N/A 02/05/2017   Procedure: ANEURYSM ABDOMINAL AORTIC REPAIR;  Surgeon: Elam Dutch, MD;  Location: [facility];  Service: Vascular;  Laterality: N/A;   CESAREAN SECTION     x 3   INSERTION OF MESH N/A 09/13/2020   Procedure: INSERTION OF MESH;  Surgeon: Ralene Ok, MD;  Location: [facility];  Service: General;  Laterality: N/A;   JOINT REPLACEMENT     ROBOTIC INCISIONAL HERNIA REPAIR WITH MESH  RETRORECTUS REPAIR (N/  09/13/2020   SHOULDER SURGERY  02/2015   TOTAL KNEE ARTHROPLASTY Right 01/23/2016   Procedure: TOTAL KNEE ARTHROPLASTY;  Surgeon: Garald Balding, MD;  Location: [facility];  Service: Orthopedics;  Laterality: Right;   TUBAL LIGATION     XI ROBOTIC ASSISTED VENTRAL HERNIA N/A 09/13/2020   Procedure: ROBOTIC INCISIONAL HERNIA REPAIR WITH MESH;  Surgeon: Ralene Ok, MD;  Location: [facility];  Service: General;  Laterality: N/A;    Current Outpatient Medications  Medication Sig Dispense Refill   aspirin 325 MG EC tablet Take 325 mg by mouth daily.     atorvastatin (LIPITOR) 40 MG tablet TAKE 1 TABLET EVERY DAY 90 tablet 3   cyclobenzaprine (FLEXERIL) 10 MG tablet Take 1 tablet (10 mg total) by mouth 3 (three) times daily as needed for muscle spasms. 30 tablet 0   diazepam (VALIUM) 5 MG tablet TAKE 1/2 TO 1 TABLET BY MOUTH TWICE A DAY AS NEEDED 30 tablet 2   HYDROcodone-acetaminophen (NORCO/VICODIN) 5-325 MG tablet Take by mouth in the morning and at bedtime.     ibuprofen (ADVIL) 200 MG tablet Take 400 mg by mouth every 6 (six) hours as needed for moderate pain or headache.     levothyroxine (SYNTHROID) 75 MCG tablet Take 1 tablet (75 mcg total) by mouth daily. 90 tablet 3  Na Sulfate-K Sulfate-Mg Sulf 17.5-3.13-1.6 GM/177ML SOLN Take 1 kit by mouth as directed. May use generic Suprep 354 mL 0   Vitamin D, Ergocalciferol, (DRISDOL) 1.25 MG (50000 UNIT) CAPS capsule Take 50,000 Units by mouth once a week.     No current facility-administered medications for this visit.    Family History  Problem Relation Age of Onset   Thyroid disease Mother    Thyroid disease Brother    Thyroid disease Other    Diabetes Other    Hyperlipidemia Other    Hypertension Other    Colon cancer Neg Hx    Colon polyps Neg Hx    Esophageal cancer Neg Hx    Rectal cancer Neg Hx    Stomach cancer Neg Hx     Review of Systems  Genitourinary:  Positive for vaginal pain.  All other systems  reviewed and are negative.   Exam:   BP 122/82   Pulse 80   Ht 5' 4"  (1.626 m)   Wt 157 lb (71.2 kg)   SpO2 97%   BMI 26.95 kg/m   Weight change: @WEIGHTCHANGE @ Height:   Height: 5' 4"  (162.6 cm)  Ht Readings from Last 3 Encounters:  03/19/22 5' 4"  (1.626 m)  02/06/22 5' 4.5" (1.638 m)  01/22/22 5' 4.5" (1.638 m)    General appearance: alert, cooperative and appears stated age   Pelvic: External genitalia:  diffuse, mild erythema and whitening. Some agglutination of the clitoris to the labia majora bilaterally              Urethra:  normal appearing urethra with no masses, tenderness or lesions              Bartholins and Skenes: normal                 Vagina: atrophic appearing vagina with normal color and discharge, no lesions. Uncomfortable with insertion of one finger.              Cervix: no lesions               Bimanual Exam:  Uterus:  normal size, contour, position, consistency, mobility, non-tender              Adnexa: no mass, fullness, tenderness               Rectovaginal: deferred  Perianal skin: mild whitening and erythema  Gae Dry chaperoned for the exam.  1. Dyspareunia, female She has a vulvitis and vaginal atrophy -Samples of uberlube given, advised not to use vaseline as a lubricant - Estradiol 10 MCG TABS vaginal tablet; Place one tablet vaginally qhs x 1 week, then change to 2 x a week  Dispense: 24 tablet; Refill: 4  2. Vaginal atrophy - Estradiol 10 MCG TABS vaginal tablet; Place one tablet vaginally qhs x 1 week, then change to 2 x a week  Dispense: 24 tablet; Refill: 4 -F/U in 2 weeks  3. Acute vulvitis - betamethasone valerate ointment (VALISONE) 0.1 %; Apply 1 Application topically 2 (two) times daily.  Dispense: 30 g; Refill: 0 - WET PREP FOR TRICH, YEAST, CLUE: negative - SureSwab Advanced Vaginitis, TMA

## 2022-03-19 NOTE — Patient Instructions (Signed)
DO NOT HAVE INTERCOURSE BEFORE YOUR FOLLOW UP VISIT   Follow the vulvar skin care instructions.  Use the steroid ointment 2 x a day for 2 weeks  Use the vaginal estrogen nightly for one week, then change to 2 x a week  F/U in 2 weeks

## 2022-03-20 LAB — SURESWAB® ADVANCED VAGINITIS,TMA
CANDIDA SPECIES: NOT DETECTED
Candida glabrata: NOT DETECTED
SURESWAB(R) ADV BACTERIAL VAGINOSIS(BV),TMA: NEGATIVE
TRICHOMONAS VAGINALIS (TV),TMA: NOT DETECTED

## 2022-04-02 ENCOUNTER — Ambulatory Visit (INDEPENDENT_AMBULATORY_CARE_PROVIDER_SITE_OTHER): Payer: Medicare HMO

## 2022-04-02 ENCOUNTER — Ambulatory Visit: Payer: Medicare HMO | Admitting: Obstetrics and Gynecology

## 2022-04-02 VITALS — Ht 65.0 in | Wt 155.0 lb

## 2022-04-02 DIAGNOSIS — Z0000 Encounter for general adult medical examination without abnormal findings: Secondary | ICD-10-CM | POA: Diagnosis not present

## 2022-04-02 NOTE — Progress Notes (Signed)
I connected with Stacy Garcia today by telephone and verified that I am speaking with the correct person using two identifiers. Location patient: home Location provider: work Persons participating in the virtual visit: Pearlean Brownie, Glenna Durand LPN.   I discussed the limitations, risks, security and privacy concerns of performing an evaluation and management service by telephone and the availability of in person appointments. I also discussed with the patient that there may be a patient responsible charge related to this service. The patient expressed understanding and verbally consented to this telephonic visit.    Interactive audio and video telecommunications were attempted between this provider and patient, however failed, due to patient having technical difficulties OR patient did not have access to video capability.  We continued and completed visit with audio only.     Vital signs may be patient reported or missing.  Subjective:   Stacy Garcia is a 71 y.o. female who presents for Medicare Annual (Subsequent) preventive examination.  Review of Systems     Cardiac Risk Factors include: advanced age (>5mn, >>85women);dyslipidemia     Objective:    Today's Vitals   04/02/22 0926  Weight: 155 lb (70.3 kg)  Height: 5' 5"  (1.651 m)   Body mass index is 25.79 kg/m.     04/02/2022    9:31 AM 03/20/2021    9:43 AM 09/13/2020    1:37 PM 09/05/2020    9:13 AM 03/08/2020   10:04 AM 12/23/2019    8:33 AM 11/17/2018    9:04 AM  Advanced Directives  Does Patient Have a Medical Advance Directive? No Yes No No No No Yes  Type of ABuilding surveyorof AHerronLiving will  Copy of HHowardin Chart?  No - copy requested     No - copy requested  Would patient like information on creating a medical advance directive?   No - Patient declined No - Patient declined No - Patient declined No - Patient declined      Current Medications (verified) Outpatient Encounter Medications as of 04/02/2022  Medication Sig   aspirin 325 MG EC tablet Take 325 mg by mouth daily.   atorvastatin (LIPITOR) 40 MG tablet TAKE 1 TABLET EVERY DAY   betamethasone valerate ointment (VALISONE) 0.1 % Apply 1 Application topically 2 (two) times daily.   Cholecalciferol (VITAMIN D3) 50 MCG (2000 UT) TABS Take 1 tablet by mouth daily.   diazepam (VALIUM) 5 MG tablet TAKE 1/2 TO 1 TABLET BY MOUTH TWICE A DAY AS NEEDED   Estradiol 10 MCG TABS vaginal tablet Place one tablet vaginally qhs x 1 week, then change to 2 x a week   HYDROcodone-acetaminophen (NORCO/VICODIN) 5-325 MG tablet Take by mouth in the morning and at bedtime.   ibuprofen (ADVIL) 200 MG tablet Take 400 mg by mouth every 6 (six) hours as needed for moderate pain or headache.   levothyroxine (SYNTHROID) 75 MCG tablet Take 1 tablet (75 mcg total) by mouth daily.   cyclobenzaprine (FLEXERIL) 10 MG tablet Take 1 tablet (10 mg total) by mouth 3 (three) times daily as needed for muscle spasms. (Patient not taking: Reported on 04/02/2022)   Na Sulfate-K Sulfate-Mg Sulf 17.5-3.13-1.6 GM/177ML SOLN Take 1 kit by mouth as directed. May use generic Suprep (Patient not taking: Reported on 04/02/2022)   Vitamin D, Ergocalciferol, (DRISDOL) 1.25 MG (50000 UNIT) CAPS capsule Take 50,000 Units by mouth once a week. (Patient not  taking: Reported on 04/02/2022)   No facility-administered encounter medications on file as of 04/02/2022.    Allergies (verified) Patient has no active allergies.   History: Past Medical History:  Diagnosis Date   AAA (abdominal aortic aneurysm) ([place])    Arthritis    Blood transfusion without reported diagnosis    COPD (chronic obstructive pulmonary disease) ([place])    Dysmenorrhea    Dyspnea 01/28/2017   just recently started wheezing   Emphysema of lung ([facility])    GERD (gastroesophageal reflux disease)    Hyperlipidemia    Hypothyroidism    Thyroid  disease    Tobacco abuse    Vertigo    Past Surgical History:  Procedure Laterality Date   ABDOMINAL AORTIC ANEURYSM REPAIR N/A 02/05/2017   Procedure: ANEURYSM ABDOMINAL AORTIC REPAIR;  Surgeon: Elam Dutch, MD;  Location: [facility];  Service: Vascular;  Laterality: N/A;   CESAREAN SECTION     x 3   INSERTION OF MESH N/A 09/13/2020   Procedure: INSERTION OF MESH;  Surgeon: Ralene Ok, MD;  Location: [facility];  Service: General;  Laterality: N/A;   JOINT REPLACEMENT     ROBOTIC INCISIONAL HERNIA REPAIR WITH MESH RETRORECTUS REPAIR (N/  09/13/2020   SHOULDER SURGERY  02/2015   TOTAL KNEE ARTHROPLASTY Right 01/23/2016   Procedure: TOTAL KNEE ARTHROPLASTY;  Surgeon: Garald Balding, MD;  Location: [facility];  Service: Orthopedics;  Laterality: Right;   TUBAL LIGATION     XI ROBOTIC ASSISTED VENTRAL HERNIA N/A 09/13/2020   Procedure: ROBOTIC INCISIONAL HERNIA REPAIR WITH MESH;  Surgeon: Ralene Ok, MD;  Location: [facility];  Service: General;  Laterality: N/A;   Family History  Problem Relation Age of Onset   Thyroid disease Mother    Thyroid disease Brother    Thyroid disease Other    Diabetes Other    Hyperlipidemia Other    Hypertension Other    Colon cancer Neg Hx    Colon polyps Neg Hx    Esophageal cancer Neg Hx    Rectal cancer Neg Hx    Stomach cancer Neg Hx    Social History   Socioeconomic History   Marital status: Married    Spouse name: Not on file   Number of children: 3   Years of education: Not on file   Highest education level: Not on file  Occupational History   Occupation: Designer/manufacturing    Comment: Librarian, academic; retired 2017   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  Tobacco Use   Smoking status: Former    Packs/day: 1.00    Years: 50.00    Total pack years: 50.00    Types: Cigarettes    Quit date: 12/31/2016    Years since quitting: 5.2   Smokeless tobacco: Never  Vaping Use   Vaping Use: Never used  Substance and Sexual  Activity   Alcohol use: No    Alcohol/week: 0.0 standard drinks of alcohol   Drug use: No   Sexual activity: Yes    Birth control/protection: Surgical  Other Topics Concern   Not on file  Social History Narrative   11/03/2018:   Lives with husband in 2 story home   Has 3 children, 7 grandchildren, 9 great-grandchildren, all of whom live close by   Takes care of two of her great grandchildren (78 yo and 14yo)   Horticulturist, commercial working, 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   Social Determinants of Health  Financial Resource Strain: Low Risk  (04/02/2022)   Overall Financial Resource Strain (CARDIA)    Difficulty of Paying Living Expenses: Not hard at all  Food Insecurity: No Food Insecurity (04/02/2022)   Hunger Vital Sign    Worried About Running Out of Food in the Last Year: Never true    Ran Out of Food in the Last Year: Never true  Transportation Needs: No Transportation Needs (04/02/2022)   PRAPARE - Hydrologist (Medical): No    Lack of Transportation (Non-Medical): No  Physical Activity: Inactive (04/02/2022)   Exercise Vital Sign    Days of Exercise per Week: 0 days    Minutes of Exercise per Session: 0 min  Stress: No Stress Concern Present (04/02/2022)   [place] of Stress : Not at all  Social Connections: [place] (03/20/2021)   Social Connection and Isolation Panel [NHANES]    Frequency of Communication with Friends and Family: Once a week    Frequency of Social Gatherings with Friends and Family: More than three times a week    Attends Religious Services: More than 4 times per year    Active Member of [employer] or Organizations: Yes    Attends Archivist Meetings: 1 to 4 times per year    Marital Status: Married    Tobacco Counseling Counseling given: Not Answered   Clinical Intake:  Pre-visit preparation  completed: Yes  Pain : No/denies pain     Nutritional Status: BMI 25 -29 Overweight Nutritional Risks: None Diabetes: No  How often do you need to have someone help you when you read instructions, pamphlets, or other written materials from your doctor or pharmacy?: 1 - Never What is the last grade level you completed in school?: 12th grade  Diabetic? no  Interpreter Needed?: No  Information entered by :: NAllen LPN   Activities of Daily Living    04/02/2022    9:32 AM 01/22/2022   10:01 AM  In your present state of health, do you have any difficulty performing the following activities:  Hearing? 0 0  Vision? 0 0  Difficulty concentrating or making decisions? 0 0  Walking or climbing stairs? 0 0  Dressing or bathing? 0 0  Doing errands, shopping? 0 0  Preparing Food and eating ? N   Using the Toilet? N   In the past six months, have you accidently leaked urine? N   Do you have problems with loss of bowel control? N   Managing your Medications? N   Managing your Finances? N   Housekeeping or managing your Housekeeping? N     Patient Care Team: [name], NP as PCP - General (Family Medicine) Garald Balding, MD as Consulting Physician (Orthopedic Surgery) Chesley Mires, MD as Consulting Physician (Pulmonary Disease)  Indicate any recent Medical Services you may have received from other than [facility] providers in the past year (date may be approximate).     Assessment:   This is a routine wellness examination for [place].  Hearing/Vision screen Vision Screening - Comments:: Regular eye exams, [facility]  Dietary issues and exercise activities discussed: Current Exercise Habits: The patient does not participate in regular exercise at present   Goals Addressed             This Visit's Progress    Patient Stated       04/02/2022, wants tomlose weight  Depression Screen    04/02/2022    9:32 AM 01/22/2022   10:01 AM 03/20/2021    9:42 AM 03/08/2020     9:56 AM 10/20/2019    9:02 AM 07/29/2019    3:05 PM 11/03/2018   10:34 AM  PHQ 2/9 Scores  PHQ - 2 Score 0 0 0 0 0 2 0  PHQ- 9 Score  0  1  8 0    Fall Risk    04/02/2022    9:32 AM 01/22/2022   10:00 AM 03/20/2021    9:44 AM 03/08/2020   10:08 AM 10/20/2019    9:02 AM  Fall Risk   Falls in the past year? 0 0 0 0 0  Number falls in past yr: 0 0 0 0 0  Injury with Fall? 0 0 0 0 0  Risk for fall due to : Medication side effect No Fall Risks  Impaired balance/gait   Risk for fall due to: Comment    vertigo episodes at times   Follow up Falls evaluation completed;Education provided;Falls prevention discussed Falls evaluation completed Falls prevention discussed Falls prevention discussed     FALL RISK PREVENTION PERTAINING TO THE HOME:  Any stairs in or around the home? No  If so, are there any without handrails?  N/a Home free of loose throw rugs in walkways, pet beds, electrical cords, etc? Yes  Adequate lighting in your home to reduce risk of falls? Yes   ASSISTIVE DEVICES UTILIZED TO PREVENT FALLS:  Life alert? No  Use of a cane, walker or w/c? No  Grab bars in the bathroom? No  Shower chair or bench in shower? No  Elevated toilet seat or a handicapped toilet? No   TIMED UP AND GO:  Was the test performed? No .      Cognitive Function:        04/02/2022    9:33 AM 03/20/2021    9:46 AM 03/08/2020   10:13 AM  6CIT Screen  What Year? 0 points 0 points 0 points  What month? 0 points 0 points 0 points  What time? 0 points 0 points 0 points  Count back from 20 0 points 0 points 0 points  Months in reverse 0 points 0 points 0 points  Repeat phrase 0 points 0 points 0 points  Total Score 0 points 0 points 0 points    Immunizations Immunization History  Administered Date(s) Administered   Fluad Quad(high Dose 65+) 06/14/2019, 08/14/2020   Influenza Whole 07/20/2007, 07/17/2010   Influenza, High Dose Seasonal PF 06/19/2017, 07/01/2018   Influenza,inj,Quad PF,6+ Mos  09/01/2013, 10/26/2015   PFIZER(Purple Top)SARS-COV-2 Vaccination 10/01/2019, 10/22/2019, 08/14/2020   Pneumococcal Conjugate-13 06/19/2017   Pneumococcal Polysaccharide-23 08/01/2008, 10/26/2015   Td 09/09/2001   Tdap 02/24/2012    TDAP status: Due, Education has been provided regarding the importance of this vaccine. Advised may receive this vaccine at local pharmacy or Health Dept. Aware to provide a copy of the vaccination record if obtained from local pharmacy or Health Dept. Verbalized acceptance and understanding.  Flu Vaccine status: Up to date  Pneumococcal vaccine status: Up to date  Covid-19 vaccine status: Completed vaccines  Qualifies for Shingles Vaccine? Yes   Zostavax completed No   Shingrix Completed?: No.    Education has been provided regarding the importance of this vaccine. Patient has been advised to call insurance company to determine out of pocket expense if they have not yet received this vaccine. Advised may also receive vaccine  at local pharmacy or Health Dept. Verbalized acceptance and understanding.  Screening Tests Health Maintenance  Topic Date Due   Zoster Vaccines- Shingrix (1 of 2) Never done   DEXA SCAN  Never done   COVID-19 Vaccine (4 - Booster for Pfizer series) 10/09/2020   COLONOSCOPY (Pts 45-33yr Insurance coverage will need to be confirmed)  01/06/2021   TETANUS/TDAP  02/23/2022   INFLUENZA VACCINE  04/09/2022   MAMMOGRAM  09/16/2023   Pneumonia Vaccine 71 Years old  Completed   Hepatitis C Screening  Completed   HPV VACCINES  Aged Out    Health Maintenance  Health Maintenance Due  Topic Date Due   Zoster Vaccines- Shingrix (1 of 2) Never done   DEXA SCAN  Never done   COVID-19 Vaccine (4 - Booster for PBethelseries) 10/09/2020   COLONOSCOPY (Pts 45-446yrInsurance coverage will need to be confirmed)  01/06/2021   TETANUS/TDAP  02/23/2022    Colorectal cancer screening: decline.   Mammogram status: Completed 09/15/2021. Repeat  every year  Bone Density status: due  Lung Cancer Screening: (Low Dose CT Chest recommended if Age 73-80ears, 30 pack-year currently smoking OR have quit w/in 15years.) does not qualify.   Lung Cancer Screening Referral: no  Additional Screening:  Hepatitis C Screening: does qualify; Completed 06/19/2017  Vision Screening: Recommended annual ophthalmology exams for early detection of glaucoma and other disorders of the eye. Is the patient up to date with their annual eye exam?  Yes  Who is the provider or what is the name of the office in which the patient attends annual eye exams? [facility] If pt is not established with a provider, would they like to be referred to a provider to establish care? No .   Dental Screening: Recommended annual dental exams for proper oral hygiene  Community Resource Referral / Chronic Care Management: CRR required this visit?  No   CCM required this visit?  No      Plan:     I have personally reviewed and noted the following in the patient's chart:   Medical and social history Use of alcohol, tobacco or illicit drugs  Current medications and supplements including opioid prescriptions.  Functional ability and status Nutritional status Physical activity Advanced directives List of other physicians Hospitalizations, surgeries, and ER visits in previous 12 months Vitals Screenings to include cognitive, depression, and falls Referrals and appointments  In addition, I have reviewed and discussed with patient certain preventive protocols, quality metrics, and best practice recommendations. A written personalized care plan for preventive services as well as general preventive health recommendations were provided to patient.     NiKellie SimmeringLPN   03/13/44/6256 Nurse Notes: none  Due to this being a virtual visit, the after visit summary with patients personalized plan was offered to patient via mail or my-chart. to pick up at office at next  visit

## 2022-04-02 NOTE — Patient Instructions (Signed)
Stacy Garcia , Thank you for taking time to come for your Medicare Wellness Visit. I appreciate your ongoing commitment to your health goals. Please review the following plan we discussed and let me know if I can assist you in the future.   Screening recommendations/referrals: Colonoscopy: decline Mammogram: completed 09/15/2021, due 09/16/2022 Bone Density: due Recommended yearly ophthalmology/optometry visit for glaucoma screening and checkup Recommended yearly dental visit for hygiene and checkup  Vaccinations: Influenza vaccine: due 04/09/2022 Pneumococcal vaccine: completed 06/19/2017 Tdap vaccine: due Shingles vaccine: discussed   Covid-19: 08/14/2020, 10/22/2019, 10/01/2019  Advanced directives: Advance directive discussed with you today.   Conditions/risks identified: none  Next appointment: Follow up in one year for your annual wellness visit    Preventive Care 65 Years and Older, Female Preventive care refers to lifestyle choices and visits with your health care provider that can promote health and wellness. What does preventive care include? A yearly physical exam. This is also called an annual well check. Dental exams once or twice a year. Routine eye exams. Ask your health care provider how often you should have your eyes checked. Personal lifestyle choices, including: Daily care of your teeth and gums. Regular physical activity. Eating a healthy diet. Avoiding tobacco and drug use. Limiting alcohol use. Practicing safe sex. Taking low-dose aspirin every day. Taking vitamin and mineral supplements as recommended by your health care provider. What happens during an annual well check? The services and screenings done by your health care provider during your annual well check will depend on your age, overall health, lifestyle risk factors, and family history of disease. Counseling  Your health care provider may ask you questions about your: Alcohol use. Tobacco use. Drug  use. Emotional well-being. Home and relationship well-being. Sexual activity. Eating habits. History of falls. Memory and ability to understand (cognition). Work and work Statistician. Reproductive health. Screening  You may have the following tests or measurements: Height, weight, and BMI. Blood pressure. Lipid and cholesterol levels. These may be checked every 5 years, or more frequently if you are over 20 years old. Skin check. Lung cancer screening. You may have this screening every year starting at age 71 if you have a 30-pack-year history of smoking and currently smoke or have quit within the past 15 years. Fecal occult blood test (FOBT) of the stool. You may have this test every year starting at age 24. Flexible sigmoidoscopy or colonoscopy. You may have a sigmoidoscopy every 5 years or a colonoscopy every 10 years starting at age 31. Hepatitis C blood test. Hepatitis B blood test. Sexually transmitted disease (STD) testing. Diabetes screening. This is done by checking your blood sugar (glucose) after you have not eaten for a while (fasting). You may have this done every 1-3 years. Bone density scan. This is done to screen for osteoporosis. You may have this done starting at age 30. Mammogram. This may be done every 1-2 years. Talk to your health care provider about how often you should have regular mammograms. Talk with your health care provider about your test results, treatment options, and if necessary, the need for more tests. Vaccines  Your health care provider may recommend certain vaccines, such as: Influenza vaccine. This is recommended every year. Tetanus, diphtheria, and acellular pertussis (Tdap, Td) vaccine. You may need a Td booster every 10 years. Zoster vaccine. You may need this after age 12. Pneumococcal 13-valent conjugate (PCV13) vaccine. One dose is recommended after age 55. Pneumococcal polysaccharide (PPSV23) vaccine. One dose is recommended after  age  47. Talk to your health care provider about which screenings and vaccines you need and how often you need them. This information is not intended to replace advice given to you by your health care provider. Make sure you discuss any questions you have with your health care provider. Document Released: 09/22/2015 Document Revised: 05/15/2016 Document Reviewed: 06/27/2015 Elsevier Interactive Patient Education  2017 [employer] Prevention in the Home Falls can cause injuries. They can happen to people of all ages. There are many things you can do to make your home safe and to help prevent falls. What can I do on the outside of my home? Regularly fix the edges of walkways and driveways and fix any cracks. Remove anything that might make you trip as you walk through a door, such as a raised step or threshold. Trim any bushes or trees on the path to your home. Use bright outdoor lighting. Clear any walking paths of anything that might make someone trip, such as rocks or tools. Regularly check to see if handrails are loose or broken. Make sure that both sides of any steps have handrails. Any raised decks and porches should have guardrails on the edges. Have any leaves, snow, or ice cleared regularly. Use sand or salt on walking paths during winter. Clean up any spills in your garage right away. This includes oil or grease spills. What can I do in the bathroom? Use night lights. Install grab bars by the toilet and in the tub and shower. Do not use towel bars as grab bars. Use non-skid mats or decals in the tub or shower. If you need to sit down in the shower, use a plastic, non-slip stool. Keep the floor dry. Clean up any water that spills on the floor as soon as it happens. Remove soap buildup in the tub or shower regularly. Attach bath mats securely with double-sided non-slip rug tape. Do not have throw rugs and other things on the floor that can make you trip. What can I do in the  bedroom? Use night lights. Make sure that you have a light by your bed that is easy to reach. Do not use any sheets or blankets that are too big for your bed. They should not hang down onto the floor. Have a firm chair that has side arms. You can use this for support while you get dressed. Do not have throw rugs and other things on the floor that can make you trip. What can I do in the kitchen? Clean up any spills right away. Avoid walking on wet floors. Keep items that you use a lot in easy-to-reach places. If you need to reach something above you, use a strong step stool that has a grab bar. Keep electrical cords out of the way. Do not use floor polish or wax that makes floors slippery. If you must use wax, use non-skid floor wax. Do not have throw rugs and other things on the floor that can make you trip. What can I do with my stairs? Do not leave any items on the stairs. Make sure that there are handrails on both sides of the stairs and use them. Fix handrails that are broken or loose. Make sure that handrails are as long as the stairways. Check any carpeting to make sure that it is firmly attached to the stairs. Fix any carpet that is loose or worn. Avoid having throw rugs at the top or bottom of the stairs. If you do  have throw rugs, attach them to the floor with carpet tape. Make sure that you have a light switch at the top of the stairs and the bottom of the stairs. If you do not have them, ask someone to add them for you. What else can I do to help prevent falls? Wear shoes that: Do not have high heels. Have rubber bottoms. Are comfortable and fit you well. Are closed at the toe. Do not wear sandals. If you use a stepladder: Make sure that it is fully opened. Do not climb a closed stepladder. Make sure that both sides of the stepladder are locked into place. Ask someone to hold it for you, if possible. Clearly mark and make sure that you can see: Any grab bars or  handrails. First and last steps. Where the edge of each step is. Use tools that help you move around (mobility aids) if they are needed. These include: Canes. Walkers. Scooters. Crutches. Turn on the lights when you go into a dark area. Replace any light bulbs as soon as they burn out. Set up your furniture so you have a clear path. Avoid moving your furniture around. If any of your floors are uneven, fix them. If there are any pets around you, be aware of where they are. Review your medicines with your doctor. Some medicines can make you feel dizzy. This can increase your chance of falling. Ask your doctor what other things that you can do to help prevent falls. This information is not intended to replace advice given to you by your health care provider. Make sure you discuss any questions you have with your health care provider. Document Released: 06/22/2009 Document Revised: 02/01/2016 Document Reviewed: 09/30/2014 Elsevier Interactive Patient Education  2017 [employer].

## 2022-04-03 ENCOUNTER — Encounter: Payer: Self-pay | Admitting: Obstetrics and Gynecology

## 2022-04-03 ENCOUNTER — Other Ambulatory Visit: Payer: Self-pay | Admitting: Obstetrics and Gynecology

## 2022-04-03 ENCOUNTER — Telehealth: Payer: Self-pay | Admitting: *Deleted

## 2022-04-03 ENCOUNTER — Ambulatory Visit: Payer: Medicare HMO | Admitting: Obstetrics and Gynecology

## 2022-04-03 ENCOUNTER — Other Ambulatory Visit: Payer: Self-pay

## 2022-04-03 ENCOUNTER — Telehealth: Payer: Self-pay

## 2022-04-03 ENCOUNTER — Other Ambulatory Visit (HOSPITAL_COMMUNITY)
Admission: RE | Admit: 2022-04-03 | Discharge: 2022-04-03 | Disposition: A | Discharge status: Home / Self Care | Payer: Medicare HMO | Source: Ambulatory Visit | Attending: Obstetrics and Gynecology | Admitting: Obstetrics and Gynecology

## 2022-04-03 VITALS — BP 108/68 | HR 73 | Resp 20

## 2022-04-03 DIAGNOSIS — N952 Postmenopausal atrophic vaginitis: Secondary | ICD-10-CM

## 2022-04-03 DIAGNOSIS — N904 Leukoplakia of vulva: Secondary | ICD-10-CM

## 2022-04-03 DIAGNOSIS — N909 Noninflammatory disorder of vulva and perineum, unspecified: Secondary | ICD-10-CM

## 2022-04-03 DIAGNOSIS — N763 Subacute and chronic vulvitis: Secondary | ICD-10-CM | POA: Insufficient documentation

## 2022-04-03 DIAGNOSIS — L309 Dermatitis, unspecified: Secondary | ICD-10-CM

## 2022-04-03 DIAGNOSIS — N941 Unspecified dyspareunia: Secondary | ICD-10-CM

## 2022-04-03 MED ORDER — ESTRADIOL 10 MCG VA TABS: ORAL_TABLET | VAGINAL | 4 refills | Status: DC

## 2022-04-03 MED ORDER — ESTRADIOL 0.1 MG/GM VA CREA: TOPICAL_CREAM | VAGINAL | 1 refills | Status: DC

## 2022-04-03 MED ORDER — TRIAMCINOLONE ACETONIDE 0.5 % EX OINT: TOPICAL_OINTMENT | Freq: Two times a day (BID) | CUTANEOUS | 0 refills | Status: DC

## 2022-04-03 NOTE — Progress Notes (Signed)
GYNECOLOGY  VISIT   HPI: 71 y.o.   Married White or Caucasian Not Hispanic or Latino  female   G3P0 with No LMP recorded. Patient is postmenopausal.   here for f/u for vaginal atrophy and vulvitis. She was started on a steroid ointment and vaginal estrogen. Vaginitis panel was negative.  She is using the steroid ointment 2 x a day, she states the vulva irritation is better. She never got the vaginal estrogen.   GYNECOLOGIC HISTORY: No LMP recorded. Patient is postmenopausal. Contraception: Postmenopausal Menopausal hormone therapy: None        OB History     Gravida  3   Para      Term      Preterm      AB      Living  3      SAB      IAB      Ectopic      Multiple      Live Births  3              Patient Active Problem List   Diagnosis Date Noted   S/P hernia repair 09/13/2020   Mixed hyperlipidemia 10/20/2019   Vertigo 10/20/2019   COPD exacerbation ([place]) 06/03/2018   Hematuria 06/03/2018   Vaginitis and vulvovaginitis 02/03/2018   Lumbar disc disease with radiculopathy 12/15/2017   Gastroesophageal reflux disease without esophagitis 11/13/2017   S/P AAA repair 11/13/2017   Special screening for malignant neoplasms, colon 11/13/2017   AAA (abdominal aortic aneurysm) ([place]) 02/05/2017   Primary osteoarthritis of right knee 01/23/2016   S/P total knee replacement using cement 01/23/2016   Peptic ulcer associated with Helicobacter pylori infection 05/03/2014   Pain of left leg 04/28/2014   AAA (abdominal aortic aneurysm) without rupture ([place]) 03/08/2013   Unilateral occipital headache 04/20/2012   Pain in left knee 11/19/2010   SHOULDER PAIN, BILATERAL 07/17/2010   WRIST PAIN, RIGHT 02/15/2010   COPD (chronic obstructive pulmonary disease) ([place]) 08/01/2008   HYPOTHYROIDISM, POST-RADIOACTIVE IODINE 07/20/2007   TOBACCO ABUSE 07/20/2007   HOT FLASHES 07/20/2007   BPPV (benign paroxysmal positional vertigo) 07/20/2007   INFLAMMATORY DISEASE OF BREAST  07/19/2007   FIBROCYSTIC BREAST DISEASE 04/01/2007    Past Medical History:  Diagnosis Date   AAA (abdominal aortic aneurysm) ([place])    Arthritis    Blood transfusion without reported diagnosis    COPD (chronic obstructive pulmonary disease) ([place])    Dysmenorrhea    Dyspnea 01/28/2017   just recently started wheezing   Emphysema of lung ([place])    GERD (gastroesophageal reflux disease)    Hyperlipidemia    Hypothyroidism    Thyroid disease    Tobacco abuse    Vertigo     Past Surgical History:  Procedure Laterality Date   ABDOMINAL AORTIC ANEURYSM REPAIR N/A 02/05/2017   Procedure: ANEURYSM ABDOMINAL AORTIC REPAIR;  Surgeon: Elam Dutch, MD;  Location: [place];  Service: Vascular;  Laterality: N/A;   CESAREAN SECTION     x 3   INSERTION OF MESH N/A 09/13/2020   Procedure: INSERTION OF MESH;  Surgeon: Ralene Ok, MD;  Location: [place];  Service: General;  Laterality: N/A;   JOINT REPLACEMENT     ROBOTIC INCISIONAL HERNIA REPAIR WITH MESH RETRORECTUS REPAIR (N/  09/13/2020   SHOULDER SURGERY  02/2015   TOTAL KNEE ARTHROPLASTY Right 01/23/2016   Procedure: TOTAL KNEE ARTHROPLASTY;  Surgeon: Garald Balding, MD;  Location: [facility];  Service: Orthopedics;  Laterality:  Right;   TUBAL LIGATION     XI ROBOTIC ASSISTED VENTRAL HERNIA N/A 09/13/2020   Procedure: ROBOTIC INCISIONAL HERNIA REPAIR WITH MESH;  Surgeon: Ralene Ok, MD;  Location: [facility];  Service: General;  Laterality: N/A;    Current Outpatient Medications  Medication Sig Dispense Refill   aspirin 325 MG EC tablet Take 325 mg by mouth daily.     atorvastatin (LIPITOR) 40 MG tablet TAKE 1 TABLET EVERY DAY 90 tablet 3   betamethasone valerate ointment (VALISONE) 0.1 % Apply 1 Application topically 2 (two) times daily. 30 g 0   Cholecalciferol (VITAMIN D3) 50 MCG (2000 UT) TABS Take 1 tablet by mouth daily.     diazepam (VALIUM) 5 MG tablet TAKE 1/2 TO 1 TABLET BY MOUTH TWICE A DAY AS NEEDED 30 tablet 2   Estradiol  10 MCG TABS vaginal tablet Place one tablet vaginally qhs x 1 week, then change to 2 x a week 24 tablet 4   HYDROcodone-acetaminophen (NORCO/VICODIN) 5-325 MG tablet Take by mouth in the morning and at bedtime.     ibuprofen (ADVIL) 200 MG tablet Take 400 mg by mouth every 6 (six) hours as needed for moderate pain or headache.     levothyroxine (SYNTHROID) 75 MCG tablet Take 1 tablet (75 mcg total) by mouth daily. 90 tablet 3   No current facility-administered medications for this visit.     ALLERGIES: Patient has no active allergies.  Family History  Problem Relation Age of Onset   Thyroid disease Mother    Thyroid disease Brother    Thyroid disease Other    Diabetes Other    Hyperlipidemia Other    Hypertension Other    Colon cancer Neg Hx    Colon polyps Neg Hx    Esophageal cancer Neg Hx    Rectal cancer Neg Hx    Stomach cancer Neg Hx     Social History   Socioeconomic History   Marital status: Married    Spouse name: Not on file   Number of children: 3   Years of education: Not on file   Highest education level: Not on file  Occupational History   Occupation: Designer/manufacturing    Comment: Librarian, academic; retired 2017   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  Tobacco Use   Smoking status: Former    Packs/day: 1.00    Years: 50.00    Total pack years: 50.00    Types: Cigarettes    Quit date: 12/31/2016    Years since quitting: 5.2   Smokeless tobacco: Never  Vaping Use   Vaping Use: Never used  Substance and Sexual Activity   Alcohol use: No    Alcohol/week: 0.0 standard drinks of alcohol   Drug use: No   Sexual activity: Yes    Birth control/protection: Surgical  Other Topics Concern   Not on file  Social History Narrative   11/03/2018:   Lives with husband in 2 story home   Has 3 children, 7 grandchildren, 47 great-grandchildren, all of whom live close by   Takes care of two of her great grandchildren (49 yo and 33yo)   Horticulturist, commercial working,  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   Social Determinants of Health   Financial Resource Strain: Low Risk  (04/02/2022)   Overall Financial Resource Strain (CARDIA)    Difficulty of Paying Living Expenses: Not hard at all  Food Insecurity: No Food Insecurity (04/02/2022)  Hunger Vital Sign    Worried About Running Out of Food in the Last Year: Never true    Ran Out of Food in the Last Year: Never true  Transportation Needs: No Transportation Needs (04/02/2022)   PRAPARE - Hydrologist (Medical): No    Lack of Transportation (Non-Medical): No  Physical Activity: Inactive (04/02/2022)   Exercise Vital Sign    Days of Exercise per Week: 0 days    Minutes of Exercise per Session: 0 min  Stress: No Stress Concern Present (04/02/2022)   [place]    Feeling of Stress : Not at all  Social Connections: [place] (03/20/2021)   Social Connection and Isolation Panel [NHANES]    Frequency of Communication with Friends and Family: Once a week    Frequency of Social Gatherings with Friends and Family: More than three times a week    Attends Religious Services: More than 4 times per year    Active Member of [employer] or Organizations: Yes    Attends Archivist Meetings: 1 to 4 times per year    Marital Status: Married  Human resources officer Violence: Not At Risk (03/20/2021)   Humiliation, Afraid, Rape, and Kick questionnaire    Fear of Current or Ex-Partner: No    Emotionally Abused: No    Physically Abused: No    Sexually Abused: No    Review of Systems  All other systems reviewed and are negative.   PHYSICAL EXAMINATION:    BP 108/68 (BP Location: Right Arm, Patient Position: Sitting)   Pulse 73   Resp 20   SpO2 93%     General appearance: alert, cooperative and appears stated age   Pelvic: External genitalia: diffuse whitening,  minimal erythema, agglutination of the clitoris and small labia minora to the labia majora.               Urethra:  normal appearing urethra with no masses, tenderness or lesions              Bartholins and Skenes: normal                 Vagina: normal appearing vagina with normal color and discharge, no lesions              Cervix: no lesions               The risks of the procedure were reviewed with the patient and a consent was signed. The area was cleansed with Hibiclens and injected with 1% lidocaine. A 4 mm punch biopsy was used to remove a circular piece of tissue. The defect was closed with 4-0 vicryl. The patient tolerated the procedure well.   Chaperone was present for exam.  1. Chronic vulvitis Not improved with valisone ointment - triamcinolone ointment (KENALOG) 0.5 %; Apply topically 2 (two) times daily. Use for 2 weeks  Dispense: 30 g; Refill: 0 - Surgical pathology( [facility]/ [facility]) -F/U in 2 weeks  2. 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 Vaginal atrophy The pharmacy was sent the vaginal estrogen script but states they didn't receive it. It was resent and the patient will start it  4. Perianal dermatitis - triamcinolone ointment (KENALOG) 0.5 %; Apply topically 2 (two) times daily. Use for 2 weeks  Dispense: 30 g; Refill: 0

## 2022-04-03 NOTE — Telephone Encounter (Signed)
Patient is in the office seeing Dr. Talbert Nan and relayed that her Estradiol 10 mcg tab Rx was not at her [facility] after being prescribed earlier this month. I called and confirmed that they do not have the Rx.  At Dr. Gentry Fitz request I resent the Rx.   Shortly after electronically sending it I called and left message for pharmacy to please call me back and let me know that they received the Rx sent today.

## 2022-04-03 NOTE — Telephone Encounter (Addendum)
PA done via cover my meds for estradiol 10 mcg tablet. Pending response from [facility].    [employer] states medication is "not on formulary list" other options:   estradiol (ESTRACE) 0.1 MG/GM vaginal cream conjugated estrogens (PREMARIN) vaginal cream  I spoke with patient and she is fine with trying a vaginal cream. Please advise

## 2022-04-03 NOTE — Patient Instructions (Signed)

## 2022-04-03 NOTE — Telephone Encounter (Signed)
I've sent in a script for estrace cream.

## 2022-04-03 NOTE — Telephone Encounter (Signed)
I confirmed with pharmacy Rx received.

## 2022-04-05 NOTE — Progress Notes (Deleted)
GYNECOLOGY  VISIT   HPI: 71 y.o.   Married White or Caucasian Not Hispanic or Latino  female   G3P0 with No LMP recorded. Patient is postmenopausal.   here for f/u on chronic vulvitis and perianal dermatitis. Biopsy showed-- Pt reports kenalog ointment--  GYNECOLOGIC HISTORY: No LMP recorded. Patient is postmenopausal. Contraception: PM Menopausal hormone therapy: none        OB History     Gravida  3   Para      Term      Preterm      AB      Living  3      SAB      IAB      Ectopic      Multiple      Live Births  3              Patient Active Problem List   Diagnosis Date Noted   S/P hernia repair 09/13/2020   Mixed hyperlipidemia 10/20/2019   Vertigo 10/20/2019   COPD exacerbation ([place]) 06/03/2018   Hematuria 06/03/2018   Vaginitis and vulvovaginitis 02/03/2018   Lumbar disc disease with radiculopathy 12/15/2017   Gastroesophageal reflux disease without esophagitis 11/13/2017   S/P AAA repair 11/13/2017   Special screening for malignant neoplasms, colon 11/13/2017   AAA (abdominal aortic aneurysm) ([place]) 02/05/2017   Primary osteoarthritis of right knee 01/23/2016   S/P total knee replacement using cement 01/23/2016   Peptic ulcer associated with Helicobacter pylori infection 05/03/2014   Pain of left leg 04/28/2014   AAA (abdominal aortic aneurysm) without rupture ([place]) 03/08/2013   Unilateral occipital headache 04/20/2012   Pain in left knee 11/19/2010   SHOULDER PAIN, BILATERAL 07/17/2010   WRIST PAIN, RIGHT 02/15/2010   COPD (chronic obstructive pulmonary disease) ([place]) 08/01/2008   HYPOTHYROIDISM, POST-RADIOACTIVE IODINE 07/20/2007   TOBACCO ABUSE 07/20/2007   HOT FLASHES 07/20/2007   BPPV (benign paroxysmal positional vertigo) 07/20/2007   INFLAMMATORY DISEASE OF BREAST 07/19/2007   FIBROCYSTIC BREAST DISEASE 04/01/2007    Past Medical History:  Diagnosis Date   AAA (abdominal aortic aneurysm) ([place])    Arthritis    Blood  transfusion without reported diagnosis    COPD (chronic obstructive pulmonary disease) ([place])    Dysmenorrhea    Dyspnea 01/28/2017   just recently started wheezing   Emphysema of lung ([place])    GERD (gastroesophageal reflux disease)    Hyperlipidemia    Hypothyroidism    Thyroid disease    Tobacco abuse    Vertigo     Past Surgical History:  Procedure Laterality Date   ABDOMINAL AORTIC ANEURYSM REPAIR N/A 02/05/2017   Procedure: ANEURYSM ABDOMINAL AORTIC REPAIR;  Surgeon: Elam Dutch, MD;  Location: [facility];  Service: Vascular;  Laterality: N/A;   CESAREAN SECTION     x 3   INSERTION OF MESH N/A 09/13/2020   Procedure: INSERTION OF MESH;  Surgeon: Ralene Ok, MD;  Location: [facility];  Service: General;  Laterality: N/A;   JOINT REPLACEMENT     ROBOTIC INCISIONAL HERNIA REPAIR WITH MESH RETRORECTUS REPAIR (N/  09/13/2020   SHOULDER SURGERY  02/2015   TOTAL KNEE ARTHROPLASTY Right 01/23/2016   Procedure: TOTAL KNEE ARTHROPLASTY;  Surgeon: Garald Balding, MD;  Location: [facility];  Service: Orthopedics;  Laterality: Right;   TUBAL LIGATION     XI ROBOTIC ASSISTED VENTRAL HERNIA N/A 09/13/2020   Procedure: ROBOTIC INCISIONAL HERNIA REPAIR WITH MESH;  Surgeon: Ralene Ok, MD;  Location:  [facility];  Service: General;  Laterality: N/A;    Current Outpatient Medications  Medication Sig Dispense Refill   aspirin 325 MG EC tablet Take 325 mg by mouth daily.     atorvastatin (LIPITOR) 40 MG tablet TAKE 1 TABLET EVERY DAY 90 tablet 3   Cholecalciferol (VITAMIN D3) 50 MCG (2000 UT) TABS Take 1 tablet by mouth daily.     diazepam (VALIUM) 5 MG tablet TAKE 1/2 TO 1 TABLET BY MOUTH TWICE A DAY AS NEEDED 30 tablet 2   estradiol (ESTRACE) 0.1 MG/GM vaginal cream 1 gram vaginally qhs x 1 week, then change to twice weekly 42.5 g 1   HYDROcodone-acetaminophen (NORCO/VICODIN) 5-325 MG tablet Take by mouth in the morning and at bedtime.     ibuprofen (ADVIL) 200 MG tablet Take 400 mg by mouth every  6 (six) hours as needed for moderate pain or headache.     levothyroxine (SYNTHROID) 75 MCG tablet Take 1 tablet (75 mcg total) by mouth daily. 90 tablet 3   triamcinolone ointment (KENALOG) 0.5 % Apply topically 2 (two) times daily. Use for 2 weeks 30 g 0   No current facility-administered medications for this visit.     ALLERGIES: Patient has no active allergies.  Family History  Problem Relation Age of Onset   Thyroid disease Mother    Thyroid disease Brother    Thyroid disease Other    Diabetes Other    Hyperlipidemia Other    Hypertension Other    Colon cancer Neg Hx    Colon polyps Neg Hx    Esophageal cancer Neg Hx    Rectal cancer Neg Hx    Stomach cancer Neg Hx     Social History   Socioeconomic History   Marital status: Married    Spouse name: Not on file   Number of children: 3   Years of education: Not on file   Highest education level: Not on file  Occupational History   Occupation: Designer/manufacturing    Comment: Librarian, academic; retired 2017   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  Tobacco Use   Smoking status: Former    Packs/day: 1.00    Years: 50.00    Total pack years: 50.00    Types: Cigarettes    Quit date: 12/31/2016    Years since quitting: 5.2   Smokeless tobacco: Never  Vaping Use   Vaping Use: Never used  Substance and Sexual Activity   Alcohol use: No    Alcohol/week: 0.0 standard drinks of alcohol   Drug use: No   Sexual activity: Yes    Birth control/protection: Surgical  Other Topics Concern   Not on file  Social History Narrative   11/03/2018:   Lives with husband in 2 story home   Has 3 children, 7 grandchildren, 40 great-grandchildren, all of whom live close by   Takes care of two of her great grandchildren (38 yo and 64yo)   Horticulturist, commercial working, 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   Social Determinants of Health   Financial Resource Strain: Low Risk  (04/02/2022)    Overall Financial Resource Strain (CARDIA)    Difficulty of Paying Living Expenses: Not hard at all  Food Insecurity: No Food Insecurity (04/02/2022)   Hunger Vital Sign    Worried About Running Out of Food in the Last Year: Never true    Ran Out of Food in the Last Year: Never true  Transportation Needs:  No Transportation Needs (04/02/2022)   PRAPARE - Hydrologist (Medical): No    Lack of Transportation (Non-Medical): No  Physical Activity: Inactive (04/02/2022)   Exercise Vital Sign    Days of Exercise per Week: 0 days    Minutes of Exercise per Session: 0 min  Stress: No Stress Concern Present (04/02/2022)   [place]    Feeling of Stress : Not at all  Social Connections: [place] (03/20/2021)   Social Connection and Isolation Panel [NHANES]    Frequency of Communication with Friends and Family: Once a week    Frequency of Social Gatherings with Friends and Family: More than three times a week    Attends Religious Services: More than 4 times per year    Active Member of [employer] or Organizations: Yes    Attends Archivist Meetings: 1 to 4 times per year    Marital Status: Married  Human resources officer Violence: Not At Risk (03/20/2021)   Humiliation, Afraid, Rape, and Kick questionnaire    Fear of Current or Ex-Partner: No    Emotionally Abused: No    Physically Abused: No    Sexually Abused: No    ROS  PHYSICAL EXAMINATION:    There were no vitals taken for this visit.    General appearance: alert, cooperative and appears stated age Neck: no adenopathy, supple, symmetrical, trachea midline and thyroid {CHL AMB PHY EX THYROID NORM DEFAULT:[phone]::"normal to inspection and palpation"} Breasts: {Exam; breast:13139::"normal appearance, no masses or tenderness"} Abdomen: soft, non-tender; non distended, no masses,  no organomegaly  Pelvic: External genitalia:  no  lesions              Urethra:  normal appearing urethra with no masses, tenderness or lesions              Bartholins and Skenes: normal                 Vagina: normal appearing vagina with normal color and discharge, no lesions              Cervix: {CHL AMB PHY EX CERVIX NORM DEFAULT:[phone]::"no lesions"}              Bimanual Exam:  Uterus:  {CHL AMB PHY EX UTERUS NORM [phone]::"normal size, contour, position, consistency, mobility, non-tender"}              Adnexa: {CHL AMB PHY EX ADNEXA NO MASS DEFAULT:[phone]::"no mass, fullness, tenderness"}              Rectovaginal: {yes no:[id number]}.  Confirms.              Anus:  normal sphincter tone, no lesions  Chaperone was present for exam.  ASSESSMENT     PLAN    An After Visit Summary was printed and given to the patient.  *** minutes face to face time of which over 50% was spent in counseling.

## 2022-04-08 LAB — SURGICAL PATHOLOGY

## 2022-04-09 ENCOUNTER — Other Ambulatory Visit: Payer: Self-pay | Admitting: Adult Health

## 2022-04-15 DIAGNOSIS — M545 Low back pain, unspecified: Secondary | ICD-10-CM | POA: Diagnosis not present

## 2022-04-15 DIAGNOSIS — Z79899 Other long term (current) drug therapy: Secondary | ICD-10-CM | POA: Diagnosis not present

## 2022-04-17 DIAGNOSIS — Z79899 Other long term (current) drug therapy: Secondary | ICD-10-CM | POA: Diagnosis not present

## 2022-04-19 ENCOUNTER — Ambulatory Visit: Payer: Medicare HMO | Admitting: Obstetrics and Gynecology

## 2022-04-22 ENCOUNTER — Telehealth: Payer: Self-pay | Admitting: *Deleted

## 2022-04-22 NOTE — Telephone Encounter (Signed)
Call returned to patient. Left message to call Sharee Pimple, RN at [facility], [phone].   Per message received from front office, patient cancelled 2 wk f/u due to [facility] cost. Per review of 04/03/22 OV notes, medication changed due to symptoms not improving, 2wk f/u recommended.

## 2022-04-30 ENCOUNTER — Other Ambulatory Visit: Payer: Self-pay | Admitting: Adult Health

## 2022-04-30 DIAGNOSIS — J439 Emphysema, unspecified: Secondary | ICD-10-CM

## 2022-05-01 NOTE — Telephone Encounter (Signed)
Okay for refill?    LOV 02/06/2022 had CPE 01/22/2022  Last Refill       diazepam (VALIUM) 5 MG tablet 30 tablet 2 12/06/2021   Sig:   TAKE 1/2 TO 1 TABLET BY MOUTH TWICE A DAY AS NEEDED

## 2022-05-08 NOTE — Telephone Encounter (Signed)
Call placed to patient. Left detailed message requesting return call to provide update on medication and symptoms.  Left message to call Sharee Pimple, RN at [place], [phone].

## 2022-05-09 ENCOUNTER — Ambulatory Visit: Payer: Medicare HMO | Admitting: Adult Health

## 2022-05-14 DIAGNOSIS — M545 Low back pain, unspecified: Secondary | ICD-10-CM | POA: Diagnosis not present

## 2022-05-14 DIAGNOSIS — Z79899 Other long term (current) drug therapy: Secondary | ICD-10-CM | POA: Diagnosis not present

## 2022-05-16 DIAGNOSIS — Z79899 Other long term (current) drug therapy: Secondary | ICD-10-CM | POA: Diagnosis not present

## 2022-05-23 NOTE — Telephone Encounter (Signed)
No return call from patient.   Routing to Dr. Rosann Auerbach.   OK to close encounter?

## 2022-06-05 ENCOUNTER — Ambulatory Visit: Payer: Medicare HMO | Admitting: Orthopaedic Surgery

## 2022-06-10 DIAGNOSIS — M545 Low back pain, unspecified: Secondary | ICD-10-CM | POA: Diagnosis not present

## 2022-06-10 DIAGNOSIS — Z79899 Other long term (current) drug therapy: Secondary | ICD-10-CM | POA: Diagnosis not present

## 2022-06-12 DIAGNOSIS — Z79899 Other long term (current) drug therapy: Secondary | ICD-10-CM | POA: Diagnosis not present

## 2022-06-24 ENCOUNTER — Telehealth: Payer: Self-pay | Admitting: Pulmonary Disease

## 2022-06-25 NOTE — Telephone Encounter (Signed)
Patient is wanting to know if we can renew her handicap placard.   Sir are you ok if we fill this out for her?  Thank you   Please advise

## 2022-06-26 NOTE — Telephone Encounter (Signed)
Please call the patient to make a follow up with Dr. Halford Chessman as she has not been seen in 2 years. Thanks

## 2022-06-26 NOTE — Telephone Encounter (Signed)
She hasn't been seen in the office since January 2021.  She needs an ROV first before we can complete any forms.  Alternatively, she could ask her PCP if she doesn't want a follow up appointment with pulmonary.

## 2022-07-02 ENCOUNTER — Ambulatory Visit (INDEPENDENT_AMBULATORY_CARE_PROVIDER_SITE_OTHER): Payer: Medicare HMO

## 2022-07-02 ENCOUNTER — Ambulatory Visit: Payer: Medicare HMO | Admitting: Pulmonary Disease

## 2022-07-02 ENCOUNTER — Encounter: Payer: Self-pay | Admitting: Pulmonary Disease

## 2022-07-02 VITALS — BP 140/76 | HR 68 | Temp 97.4°F | Ht 65.0 in | Wt 154.8 lb

## 2022-07-02 DIAGNOSIS — R0609 Other forms of dyspnea: Secondary | ICD-10-CM | POA: Diagnosis not present

## 2022-07-02 DIAGNOSIS — J449 Chronic obstructive pulmonary disease, unspecified: Secondary | ICD-10-CM

## 2022-07-02 DIAGNOSIS — R0600 Dyspnea, unspecified: Secondary | ICD-10-CM | POA: Diagnosis not present

## 2022-07-02 MED ORDER — ALBUTEROL SULFATE HFA 108 (90 BASE) MCG/ACT IN AERS: 2.0000 | INHALATION_SPRAY | Freq: Four times a day (QID) | RESPIRATORY_TRACT | 5 refills | Status: AC | PRN

## 2022-07-02 MED ORDER — STIOLTO RESPIMAT 2.5-2.5 MCG/ACT IN AERS: 2.0000 | INHALATION_SPRAY | Freq: Every day | RESPIRATORY_TRACT | 5 refills | Status: DC

## 2022-07-02 NOTE — Patient Instructions (Signed)
Stiolto two puffs daily.  Stop incruse once you start using stiolto.  Albuterol two puffs every 6 hours as needed for cough, wheeze, sputum, or chest congestion.  Chest xray today.  Will arrange for pulmonary function test and follow up with Dr. Halford Chessman or a nurse practitioner in 6 weeks.

## 2022-07-02 NOTE — Progress Notes (Signed)
[facility]  Chief Complaint  Patient presents with   Follow-up    Cough. Discuss COPD progression.    Past Surgical History:  She  has a past surgical history that includes Cesarean section; Shoulder surgery (02/2015); Total knee arthroplasty (Right, 01/23/2016); Joint replacement; Tubal ligation; Abdominal aortic aneurysm repair (N/A, 02/05/2017); ROBOTIC INCISIONAL HERNIA REPAIR WITH MESH RETRORECTUS REPAIR (N/ (09/13/2020); XI robotic assisted ventral hernia (N/A, 09/13/2020); and Insertion of mesh (N/A, 09/13/2020).  Past Medical History:  Vertigo, Hypothyroidism, HLD, GERD, OA, AAA  Constitutional:  BP (!) 140/76 (BP Location: Right Arm, Patient Position: Sitting, Cuff Size: Normal)   Pulse 68   Temp (!) 97.4 F (36.3 C) (Oral)   Ht '5\' 5"'$  (1.651 m)   Wt 154 lb 12.8 oz (70.2 kg)   SpO2 97%   BMI 25.76 kg/m   Brief Summary:  Stacy Garcia is a 71 y.o. female former smoker with COPD and emphysema.      Subjective:   I last saw her in January 2021.  She has more cough.  Usually dry.  Chest gets sore from coughing.  Has intermittent wheeze.  Not much sputum.  Has trouble with her breathing sometimes at night.  Uses albuterol and this helps.  Not having chest pain, or leg swelling.    Physical Exam:   Appearance - well kempt   ENMT - no sinus tenderness, no oral exudate, no LAN, Mallampati 3 airway, no stridor  Respiratory - decreased breath sounds bilaterally, no wheezing or rales  CV - s1s2 regular rate and rhythm, no murmurs  Ext - no clubbing, no edema  Skin - no rashes  Psych - normal mood and affect   Pulmonary testing:  PFT 10/23/15 >> FEV1 1.25 (50%), FEV1% 69, TLC 4.54 (87%), DLCO 52%, no BD  Social History:  She  reports that she quit smoking about 5 years ago. Her smoking use included cigarettes. She has a 50.00 pack-year smoking history. She has never used smokeless tobacco. She reports that she does not drink  alcohol and does not use drugs.  Family History:  Her family history includes Diabetes in an other family member; Hyperlipidemia in an other family member; Hypertension in an other family member; Thyroid disease in her brother, mother, and another family member.     Assessment/Plan:   COPD with emphysema. - has progressive symptoms - breztri was ineffective previously - will have her try stiolto in place of incruse - prn albuterol - chest xray today - will arrange for pulmonary function test - handicap parking form completed  Time Spent Involved in Patient Care on Day of Examination:  37 minutes  Follow up:   Patient Instructions  Stiolto two puffs daily.  Stop incruse once you start using stiolto.  Albuterol two puffs every 6 hours as needed for cough, wheeze, sputum, or chest congestion.  Chest xray today.  Will arrange for pulmonary function test and follow up with Dr. Halford Chessman or a nurse practitioner in 6 weeks.  Medication List:   Allergies as of 07/02/2022   No Active Allergies      Medication List        Accurate as of July 02, 2022  3:50 PM. If you have any questions, ask your nurse or doctor.          STOP taking these medications    estradiol 0.1 MG/GM vaginal cream Commonly known as: ESTRACE Stopped by: Chesley Mires, MD  triamcinolone ointment 0.5 % Commonly known as: KENALOG Stopped by: Chesley Mires, MD   Vitamin D3 50 MCG (2000 UT) Tabs Stopped by: Chesley Mires, MD       TAKE these medications    albuterol 108 (90 Base) MCG/ACT inhaler Commonly known as: Ventolin HFA Inhale 2 puffs into the lungs every 6 (six) hours as needed for wheezing or shortness of breath. Started by: Chesley Mires, MD   aspirin EC 325 MG tablet Take 325 mg by mouth daily.   atorvastatin 40 MG tablet Commonly known as: LIPITOR TAKE 1 TABLET EVERY DAY   diazepam 5 MG tablet Commonly known as: VALIUM TAKE 1/2 TO 1 TABLET BY MOUTH TWICE A DAY AS NEEDED    HYDROcodone-acetaminophen 7.5-325 MG tablet Commonly known as: NORCO Take 1 tablet by mouth 3 (three) times daily as needed. What changed: Another medication with the same name was removed. Continue taking this medication, and follow the directions you see here. Changed by: Chesley Mires, MD   ibuprofen 200 MG tablet Commonly known as: ADVIL Take 400 mg by mouth every 6 (six) hours as needed for moderate pain or headache.   levothyroxine 75 MCG tablet Commonly known as: SYNTHROID Take 1 tablet (75 mcg total) by mouth daily.   Stiolto Respimat 2.5-2.5 MCG/ACT Aers Generic drug: Tiotropium Bromide-Olodaterol Inhale 2 puffs into the lungs daily. Started by: Chesley Mires, MD        Signature:  Chesley Mires, MD [place] Pager - [phone] 07/02/2022, 3:50 PM

## 2022-07-04 ENCOUNTER — Emergency Department (HOSPITAL_COMMUNITY)
Admission: EM | Admit: 2022-07-04 | Discharge: 2022-07-04 | Discharge status: Against Medical Advice | Payer: Medicare HMO

## 2022-07-04 DIAGNOSIS — Z9889 Other specified postprocedural states: Secondary | ICD-10-CM | POA: Diagnosis not present

## 2022-07-04 DIAGNOSIS — Z8679 Personal history of other diseases of the circulatory system: Secondary | ICD-10-CM | POA: Diagnosis not present

## 2022-07-04 DIAGNOSIS — N390 Urinary tract infection, site not specified: Secondary | ICD-10-CM | POA: Diagnosis not present

## 2022-07-04 DIAGNOSIS — A499 Bacterial infection, unspecified: Secondary | ICD-10-CM | POA: Diagnosis not present

## 2022-07-04 DIAGNOSIS — R1084 Generalized abdominal pain: Secondary | ICD-10-CM | POA: Diagnosis not present

## 2022-07-04 DIAGNOSIS — M79605 Pain in left leg: Secondary | ICD-10-CM | POA: Diagnosis not present

## 2022-07-04 DIAGNOSIS — M79604 Pain in right leg: Secondary | ICD-10-CM | POA: Diagnosis not present

## 2022-07-04 DIAGNOSIS — R0989 Other specified symptoms and signs involving the circulatory and respiratory systems: Secondary | ICD-10-CM | POA: Diagnosis not present

## 2022-07-04 NOTE — ED Notes (Signed)
I was told by [facility] staff that pt had left

## 2022-08-20 ENCOUNTER — Ambulatory Visit: Payer: Medicare HMO | Admitting: Adult Health

## 2022-09-19 LAB — HM MAMMOGRAPHY

## 2022-09-20 ENCOUNTER — Encounter: Payer: Self-pay | Admitting: Adult Health

## 2022-09-27 ENCOUNTER — Ambulatory Visit: Payer: Medicare HMO | Admitting: Adult Health

## 2022-10-24 ENCOUNTER — Ambulatory Visit (INDEPENDENT_AMBULATORY_CARE_PROVIDER_SITE_OTHER): Payer: Medicare HMO | Admitting: Family Medicine

## 2022-10-24 ENCOUNTER — Encounter: Payer: Self-pay | Admitting: Family Medicine

## 2022-10-24 VITALS — BP 140/80 | HR 94 | Temp 98.8°F | Ht 65.0 in | Wt 151.6 lb

## 2022-10-24 DIAGNOSIS — J020 Streptococcal pharyngitis: Secondary | ICD-10-CM

## 2022-10-24 DIAGNOSIS — R6889 Other general symptoms and signs: Secondary | ICD-10-CM | POA: Diagnosis not present

## 2022-10-24 DIAGNOSIS — J069 Acute upper respiratory infection, unspecified: Secondary | ICD-10-CM

## 2022-10-24 LAB — POC COVID19 BINAXNOW: SARS Coronavirus 2 Ag: NEGATIVE

## 2022-10-24 LAB — POCT RAPID STREP A (OFFICE): Rapid Strep A Screen: POSITIVE — AB

## 2022-10-24 LAB — POCT INFLUENZA A/B
Influenza A, POC: NEGATIVE
Influenza B, POC: NEGATIVE

## 2022-10-24 MED ORDER — AMOXICILLIN 500 MG PO TABS: 500.0000 mg | ORAL_TABLET | Freq: Two times a day (BID) | ORAL | 0 refills | Status: AC

## 2022-10-24 NOTE — Progress Notes (Signed)
   Established Patient Office Visit   Subjective  Patient ID: Stacy Garcia, female    DOB: 1951/03/13  Age: 72 y.o. MRN: [id number]  Chief Complaint  Patient presents with   Cough   Sore Throat    Patient is a 72 yo female followed by Stacy Peng, NP and seen for acute concern.  Patient endorses cold-like symptoms starting 5 days ago.  Developed dry cough-3 days ago, rhinorrhea, headache.  Patient denies sore throat, fever, sick contacts.  Took the next for symptoms.  Cough Associated symptoms include headaches.  Headache  Associated symptoms include coughing.      Review of Systems  Respiratory:  Positive for cough.   Neurological:  Positive for headaches.   Negative unless stated above    Objective:     There were no vitals taken for this visit.   Physical Exam Constitutional:      General: She is not in acute distress.    Appearance: Normal appearance.  HENT:     Head: Normocephalic and atraumatic.     Right Ear: Tympanic membrane normal.     Left Ear: Tympanic membrane normal.     Nose: Nose normal.     Mouth/Throat:     Mouth: Mucous membranes are moist.     Pharynx: Posterior oropharyngeal erythema present.     Tonsils: Tonsillar exudate present.  Eyes:     Conjunctiva/sclera: Conjunctivae normal.     Pupils: Pupils are equal, round, and reactive to light.  Cardiovascular:     Rate and Rhythm: Normal rate and regular rhythm.     Heart sounds: Normal heart sounds. No murmur heard.    No gallop.  Pulmonary:     Effort: Pulmonary effort is normal. No respiratory distress.     Breath sounds: Normal breath sounds. No wheezing, rhonchi or rales.  Lymphadenopathy:     Cervical: No cervical adenopathy.  Skin:    General: Skin is warm and dry.  Neurological:     Mental Status: She is alert and oriented to person, place, and time.      Results for orders placed or performed in visit on 10/24/22  POC COVID-19 BinaxNow  Result Value Ref Range   SARS  Coronavirus 2 Ag Negative Negative  POCT Influenza A/B  Result Value Ref Range   Influenza A, POC Negative Negative   Influenza B, POC Negative Negative  POCT rapid strep A  Result Value Ref Range   Rapid Strep A Screen Positive (A) Negative      Assessment & Plan:  Strep pharyngitis -     Amoxicillin; Take 1 tablet (500 mg total) by mouth 2 (two) times daily for 10 days.  Dispense: 20 tablet; Refill: 0  Flu-like symptoms -     POC COVID-19 BinaxNow -     POCT Influenza A/B -     POCT rapid strep A  Viral URI with cough  Rapid test in clinic positive for strep.  Start ABX for strep pharyngitis.  Continue OTC medications and other supportive care.  Flu and COVID testing negative.  Return if symptoms worsen or fail to improve.   Stacy Ruddy, MD

## 2022-12-01 ENCOUNTER — Other Ambulatory Visit: Payer: Self-pay | Admitting: Adult Health

## 2022-12-01 DIAGNOSIS — J439 Emphysema, unspecified: Secondary | ICD-10-CM

## 2023-01-09 ENCOUNTER — Other Ambulatory Visit: Payer: Self-pay | Admitting: Adult Health

## 2023-01-27 ENCOUNTER — Telehealth: Payer: Self-pay

## 2023-01-27 DIAGNOSIS — I714 Abdominal aortic aneurysm, without rupture, unspecified: Secondary | ICD-10-CM

## 2023-01-27 NOTE — Telephone Encounter (Signed)
Caller: Patient  Concern: abdominal pain  Location: abdomen  Description:  Intermittently, lasting as long as a couple of days at a time for the past few months, no other symptoms  Treatments: acetaminophen and ibuprofen (OTC)  Resolution: Appointment scheduled for earliest available for AAA [place] and MD and Instructed patient to proceed to nearest ER if severe abd/back pain   Next Appt: Appointment scheduled for 5/31, fasting instructions reviewed.

## 2023-02-06 NOTE — Progress Notes (Signed)
Office Note     CC:  Intermittent pain status post open AAA repair Requesting Provider:  Shirline Frees, NP  HPI: Stacy Garcia is a 72 y.o. (06/19/1951) female presenting at the request of .Nafziger, Kandee Keen, NP for evaluation of waxing waning abdominal pain.  Patient has a history of open AAA repair in 2018 with Dr. Darrick Penna.  This was completed with an aortic tube graft.  Postop course uneventful.  On exam, Stacy Garcia was doing well.  She notes waxing waning, generalized abdominal pain which occurs roughly once a week.  This can last for several hours, but then resolves.  This is not postprandial, and is random.  She does not have a trigger.  Bowel movements, no nausea, no vomiting. Prior history of H. pylori in 2015.  No symptoms of claudication, ischemic rest pain, tissue loss.  The pt is  on a statin for cholesterol management.  The pt is  on a daily aspirin.   Other AC:  - The pt is not on medication for hypertension.   The pt is not diabetic.  Tobacco hx:  former  Past Medical History:  Diagnosis Date   AAA (abdominal aortic aneurysm) (HCC)    Arthritis    Blood transfusion without reported diagnosis    COPD (chronic obstructive pulmonary disease) (HCC)    Dysmenorrhea    Dyspnea 01/28/2017   just recently started wheezing   Emphysema of lung (HCC)    GERD (gastroesophageal reflux disease)    Hyperlipidemia    Hypothyroidism    Thyroid disease    Tobacco abuse    Vertigo     Past Surgical History:  Procedure Laterality Date   ABDOMINAL AORTIC ANEURYSM REPAIR N/A 02/05/2017   Procedure: ANEURYSM ABDOMINAL AORTIC REPAIR;  Surgeon: Sherren Kerns, MD;  Location: [facility] OR;  Service: Vascular;  Laterality: N/A;   CESAREAN SECTION     x 3   INSERTION OF MESH N/A 09/13/2020   Procedure: INSERTION OF MESH;  Surgeon: Axel Filler, MD;  Location: [facility] OR;  Service: General;  Laterality: N/A;   JOINT REPLACEMENT     ROBOTIC INCISIONAL HERNIA REPAIR WITH MESH RETRORECTUS REPAIR (N/   09/13/2020   SHOULDER SURGERY  02/2015   TOTAL KNEE ARTHROPLASTY Right 01/23/2016   Procedure: TOTAL KNEE ARTHROPLASTY;  Surgeon: Valeria Batman, MD;  Location: [facility] OR;  Service: Orthopedics;  Laterality: Right;   TUBAL LIGATION     XI ROBOTIC ASSISTED VENTRAL HERNIA N/A 09/13/2020   Procedure: ROBOTIC INCISIONAL HERNIA REPAIR WITH MESH;  Surgeon: Axel Filler, MD;  Location: [facility] OR;  Service: General;  Laterality: N/A;    Social History   Socioeconomic History   Marital status: Married    Spouse name: Not on file   Number of children: 3   Years of education: Not on file   Highest education level: Not on file  Occupational History   Occupation: Designer/manufacturing    Comment: Runner, broadcasting/film/video; retired 2017   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  Tobacco Use   Smoking status: Former    Packs/day: 1.00    Years: 50.00    Additional pack years: 0.00    Total pack years: 50.00    Types: Cigarettes    Quit date: 12/31/2016    Years since quitting: 6.1   Smokeless tobacco: Never  Vaping Use   Vaping Use: Never used  Substance and Sexual Activity   Alcohol use: No    Alcohol/week: 0.0 standard  drinks of alcohol   Drug use: No   Sexual activity: Yes    Birth control/protection: Surgical  Other Topics Concern   Not on file  Social History Narrative   11/03/2018:   Lives with husband in 2 story home   Has 3 children, 7 grandchildren, 9 great-grandchildren, all of whom live close by   Takes care of two of her great grandchildren (74 yo and 5yo)   Engineer, manufacturing working, 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   Social Determinants of Health   Financial Resource Strain: Low Risk  (04/02/2022)   Overall Financial Resource Strain (CARDIA)    Difficulty of Paying Living Expenses: Not hard at all  Food Insecurity: No Food Insecurity (04/02/2022)   Hunger Vital Sign    Worried About Running Out of Food in the Last Year: Never true     Ran Out of Food in the Last Year: Never true  Transportation Needs: No Transportation Needs (04/02/2022)   PRAPARE - Administrator, Civil Service (Medical): No    Lack of Transportation (Non-Medical): No  Physical Activity: Inactive (04/02/2022)   Exercise Vital Sign    Days of Exercise per Week: 0 days    Minutes of Exercise per Session: 0 min  Stress: No Stress Concern Present (04/02/2022)   [employer] of Occupational Health - Occupational Stress Questionnaire    Feeling of Stress : Not at all  Social Connections: Socially Integrated (03/20/2021)   Social Connection and Isolation Panel [NHANES]    Frequency of Communication with Friends and Family: Once a week    Frequency of Social Gatherings with Friends and Family: More than three times a week    Attends Religious Services: More than 4 times per year    Active Member of [employer] or Organizations: Yes    Attends Banker Meetings: 1 to 4 times per year    Marital Status: Married  Catering manager Violence: Not At Risk (03/20/2021)   Humiliation, Afraid, Rape, and Kick questionnaire    Fear of Current or Ex-Partner: No    Emotionally Abused: No    Physically Abused: No    Sexually Abused: No   Family History  Problem Relation Age of Onset   Thyroid disease Mother    Thyroid disease Brother    Thyroid disease Other    Diabetes Other    Hyperlipidemia Other    Hypertension Other    Colon cancer Neg Hx    Colon polyps Neg Hx    Esophageal cancer Neg Hx    Rectal cancer Neg Hx    Stomach cancer Neg Hx     Current Outpatient Medications  Medication Sig Dispense Refill   albuterol (VENTOLIN HFA) 108 (90 Base) MCG/ACT inhaler Inhale 2 puffs into the lungs every 6 (six) hours as needed for wheezing or shortness of breath. 1 each 5   aspirin 325 MG EC tablet Take 325 mg by mouth daily.     atorvastatin (LIPITOR) 40 MG tablet TAKE 1 TABLET EVERY DAY 90 tablet 3   diazepam (VALIUM) 5 MG tablet TAKE 1/2  TO 1 TABLET BY MOUTH TWICE A DAY AS NEEDED 30 tablet 0   HYDROcodone-acetaminophen (NORCO) 7.5-325 MG tablet Take 1 tablet by mouth 3 (three) times daily as needed.     ibuprofen (ADVIL) 200 MG tablet Take 400 mg by mouth every 6 (six) hours as needed for moderate pain or headache.     levothyroxine (SYNTHROID) 75  MCG tablet TAKE 1 TABLET EVERY DAY 30 tablet 0   Tiotropium Bromide-Olodaterol (STIOLTO RESPIMAT) 2.5-2.5 MCG/ACT AERS Inhale 2 puffs into the lungs daily. 1 each 5   No current facility-administered medications for this visit.    No Active Allergies   REVIEW OF SYSTEMS:  [X]  denotes positive finding, [ ]  denotes negative finding Cardiac  Comments:  Chest pain or chest pressure:    Shortness of breath upon exertion:    Short of breath when lying flat:    Irregular heart rhythm:        Vascular    Pain in calf, thigh, or hip brought on by ambulation:    Pain in feet at night that wakes you up from your sleep:     Blood clot in your veins:    Leg swelling:         Pulmonary    Oxygen at home:    Productive cough:     Wheezing:         Neurologic    Sudden weakness in arms or legs:     Sudden numbness in arms or legs:     Sudden onset of difficulty speaking or slurred speech:    Temporary loss of vision in one eye:     Problems with dizziness:         Gastrointestinal    Blood in stool:     Vomited blood:         Genitourinary    Burning when urinating:     Blood in urine:        Psychiatric    Major depression:         Hematologic    Bleeding problems:    Problems with blood clotting too easily:        Skin    Rashes or ulcers:        Constitutional    Fever or chills:      PHYSICAL EXAMINATION:  There were no vitals filed for this visit.  General:  WDWN in NAD; vital signs documented above Gait: Not observed HENT: WNL, normocephalic Pulmonary: normal non-labored breathing , without wheezing Cardiac: regular HR Abdomen: soft, NT, no masses,  No hernia appreciated Skin: without rashes Vascular Exam/Pulses:  Right Left  Radial 2+ (normal) 2+ (normal)  Ulnar    Femoral    Popliteal    DP 2+ (normal) 2+ (normal)  PT     Extremities: without ischemic changes, without Gangrene , without cellulitis; without open wounds;  Musculoskeletal: no muscle wasting or atrophy  Neurologic: A&O X 3;  No focal weakness or paresthesias are detected Psychiatric:  The pt has Normal affect.   Non-Invasive Vascular Imaging:   See CTA    ASSESSMENT/PLAN: Stacy Garcia is a 72 y.o. female presenting with concern for abdominal pain in the setting of 2018 open AAA repair with aortic tube graft by Dr. Darrick Penna.  Recent CT scan from 02/06/2022 demonstrated no concerning features. Reviewed, no changes.  No hernia appreciated on physical exam. Patient with history of peptic ulcer disease with H. pylori.  Will send a referral to gastroenterology to evaluate other etiologies of pain.   Victorino Sparrow, MD [facility] [phone]

## 2023-02-07 ENCOUNTER — Ambulatory Visit: Payer: Medicare HMO | Admitting: Vascular Surgery

## 2023-02-07 ENCOUNTER — Encounter: Payer: Self-pay | Admitting: Vascular Surgery

## 2023-02-07 ENCOUNTER — Ambulatory Visit (HOSPITAL_COMMUNITY)
Admission: RE | Admit: 2023-02-07 | Discharge: 2023-02-07 | Disposition: A | Discharge status: Home / Self Care | Payer: Medicare HMO | Source: Ambulatory Visit | Attending: Vascular Surgery | Admitting: Vascular Surgery

## 2023-02-07 VITALS — BP 143/81 | HR 62 | Temp 97.9°F | Resp 20 | Ht 65.0 in | Wt 154.0 lb

## 2023-02-07 DIAGNOSIS — Z8679 Personal history of other diseases of the circulatory system: Secondary | ICD-10-CM | POA: Diagnosis not present

## 2023-02-07 DIAGNOSIS — I714 Abdominal aortic aneurysm, without rupture, unspecified: Secondary | ICD-10-CM

## 2023-02-07 DIAGNOSIS — R1084 Generalized abdominal pain: Secondary | ICD-10-CM

## 2023-02-07 DIAGNOSIS — Z9889 Other specified postprocedural states: Secondary | ICD-10-CM | POA: Diagnosis not present

## 2023-02-12 ENCOUNTER — Other Ambulatory Visit: Payer: Self-pay | Admitting: Adult Health

## 2023-02-12 NOTE — Telephone Encounter (Signed)
Patient need to schedule an ov for more refills. 

## 2023-02-13 ENCOUNTER — Encounter: Payer: Medicare HMO | Admitting: Adult Health

## 2023-02-28 ENCOUNTER — Encounter: Payer: Medicare HMO | Admitting: Adult Health

## 2023-02-28 NOTE — Progress Notes (Deleted)
Subjective:    Patient ID: Stacy Garcia, female    DOB: 1950/12/13, 72 y.o.   MRN: [id number]  HPI Patient presents for yearly preventative medicine examination. She is a pleasant 72 year old female who  has a past medical history of AAA (abdominal aortic aneurysm) (HCC), Arthritis, Blood transfusion without reported diagnosis, COPD (chronic obstructive pulmonary disease) (HCC), Dysmenorrhea, Dyspnea (01/28/2017), Emphysema of lung (HCC), GERD (gastroesophageal reflux disease), Hyperlipidemia, Hypothyroidism, Thyroid disease, Tobacco abuse, and Vertigo.  Hypothyroidism - takes synthroid 100 mcg daily. She feels controlled on this dose  Lab Results  Component Value Date   TSH 0.23 (L) 01/22/2022    Hyperlipidemia - managed with lipitor 40 mg and ASA 81 mg daily. She denies myalgia or fatigue  Lab Results  Component Value Date   CHOL 175 01/22/2022   HDL 51.30 01/22/2022   LDLCALC 98 01/22/2022   LDLDIRECT 132.0 09/15/2018   TRIG 130.0 01/22/2022   CHOLHDL 3 01/22/2022     COPD - is followed by pulmonary on a routine basis. She is not currently using any maintenance  inhalers and feels well controlled without them but does have a rescue inhaler she uses PRN.   H/o Vertigo- takes Valium 2.5 mg to 5 mg PRN   AAA- has been stable at 3 cm - s/p repair   Vitamin D Deficiency - was on Vitamin D 50,000 units weekly but stopped taking this. This was prescribed by pain management  Last vitamin D Lab Results  Component Value Date   VD25OH 35.56 01/22/2022     Chronic low back pain - managed by pain management - Taking Norco 7.5 mg    All immunizations and health maintenance protocols were reviewed with the patient and needed orders were placed.  Appropriate screening laboratory values were ordered for the patient including screening of hyperlipidemia, renal function and hepatic function.   Medication reconciliation,  past medical history, social history, problem list and  allergies were reviewed in detail with the patient  Goals were established with regard to weight loss, exercise, and  diet in compliance with medications Wt Readings from Last 3 Encounters:  02/07/23 154 lb (69.9 kg)  10/24/22 151 lb 9.6 oz (68.8 kg)  07/02/22 154 lb 12.8 oz (70.2 kg)   Review of Systems  Constitutional: Negative.   HENT: Negative.    Eyes: Negative.   Respiratory: Negative.    Cardiovascular: Negative.   Gastrointestinal: Negative.   Endocrine: Negative.   Genitourinary: Negative.   Musculoskeletal: Negative.   Skin: Negative.   Allergic/Immunologic: Negative.   Neurological: Negative.   Hematological: Negative.   Psychiatric/Behavioral: Negative.     Past Medical History:  Diagnosis Date   AAA (abdominal aortic aneurysm) (HCC)    Arthritis    Blood transfusion without reported diagnosis    COPD (chronic obstructive pulmonary disease) (HCC)    Dysmenorrhea    Dyspnea 01/28/2017   just recently started wheezing   Emphysema of lung (HCC)    GERD (gastroesophageal reflux disease)    Hyperlipidemia    Hypothyroidism    Thyroid disease    Tobacco abuse    Vertigo     Social History   Socioeconomic History   Marital status: Married    Spouse name: Not on file   Number of children: 3   Years of education: Not on file   Highest education level: Not on file  Occupational History   Occupation: Designer/manufacturing    Comment: Runner, broadcasting/film/video; retired 2017  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  Tobacco Use   Smoking status: Former    Packs/day: 1.00    Years: 50.00    Additional pack years: 0.00    Total pack years: 50.00    Types: Cigarettes    Quit date: 12/31/2016    Years since quitting: 6.1   Smokeless tobacco: Never  Vaping Use   Vaping Use: Never used  Substance and Sexual Activity   Alcohol use: No    Alcohol/week: 0.0 standard drinks of alcohol   Drug use: No   Sexual activity: Yes    Birth control/protection:  Surgical  Other Topics Concern   Not on file  Social History Narrative   11/03/2018:   Lives with husband in 2 story home   Has 3 children, 7 grandchildren, 9 great-grandchildren, all of whom live close by   Takes care of two of her great grandchildren (42 yo and 5yo)   Engineer, manufacturing working, 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   Social Determinants of Health   Financial Resource Strain: Low Risk  (04/02/2022)   Overall Financial Resource Strain (CARDIA)    Difficulty of Paying Living Expenses: Not hard at all  Food Insecurity: No Food Insecurity (04/02/2022)   Hunger Vital Sign    Worried About Running Out of Food in the Last Year: Never true    Ran Out of Food in the Last Year: Never true  Transportation Needs: No Transportation Needs (04/02/2022)   PRAPARE - Administrator, Civil Service (Medical): No    Lack of Transportation (Non-Medical): No  Physical Activity: Inactive (04/02/2022)   Exercise Vital Sign    Days of Exercise per Week: 0 days    Minutes of Exercise per Session: 0 min  Stress: No Stress Concern Present (04/02/2022)   [employer] of Occupational Health - Occupational Stress Questionnaire    Feeling of Stress : Not at all  Social Connections: Socially Integrated (03/20/2021)   Social Connection and Isolation Panel [NHANES]    Frequency of Communication with Friends and Family: Once a week    Frequency of Social Gatherings with Friends and Family: More than three times a week    Attends Religious Services: More than 4 times per year    Active Member of Clubs or Organizations: Yes    Attends Banker Meetings: 1 to 4 times per year    Marital Status: Married  Catering manager Violence: Not At Risk (03/20/2021)   Humiliation, Afraid, Rape, and Kick questionnaire    Fear of Current or Ex-Partner: No    Emotionally Abused: No    Physically Abused: No    Sexually Abused: No    Past Surgical History:   Procedure Laterality Date   ABDOMINAL AORTIC ANEURYSM REPAIR N/A 02/05/2017   Procedure: ANEURYSM ABDOMINAL AORTIC REPAIR;  Surgeon: Sherren Kerns, MD;  Location: [facility] OR;  Service: Vascular;  Laterality: N/A;   CESAREAN SECTION     x 3   INSERTION OF MESH N/A 09/13/2020   Procedure: INSERTION OF MESH;  Surgeon: Axel Filler, MD;  Location: [facility] OR;  Service: General;  Laterality: N/A;   JOINT REPLACEMENT     ROBOTIC INCISIONAL HERNIA REPAIR WITH MESH RETRORECTUS REPAIR (N/  09/13/2020   SHOULDER SURGERY  02/2015   TOTAL KNEE ARTHROPLASTY Right 01/23/2016   Procedure: TOTAL KNEE ARTHROPLASTY;  Surgeon: Valeria Batman, MD;  Location: [facility] OR;  Service: Orthopedics;  Laterality:  Right;   TUBAL LIGATION     XI ROBOTIC ASSISTED VENTRAL HERNIA N/A 09/13/2020   Procedure: ROBOTIC INCISIONAL HERNIA REPAIR WITH MESH;  Surgeon: Axel Filler, MD;  Location: [facility] OR;  Service: General;  Laterality: N/A;    Family History  Problem Relation Age of Onset   Thyroid disease Mother    Thyroid disease Brother    Thyroid disease Other    Diabetes Other    Hyperlipidemia Other    Hypertension Other    Colon cancer Neg Hx    Colon polyps Neg Hx    Esophageal cancer Neg Hx    Rectal cancer Neg Hx    Stomach cancer Neg Hx     No Known Allergies  Current Outpatient Medications on File Prior to Visit  Medication Sig Dispense Refill   albuterol (VENTOLIN HFA) 108 (90 Base) MCG/ACT inhaler Inhale 2 puffs into the lungs every 6 (six) hours as needed for wheezing or shortness of breath. 1 each 5   aspirin 325 MG EC tablet Take 325 mg by mouth daily.     atorvastatin (LIPITOR) 40 MG tablet TAKE 1 TABLET EVERY DAY 90 tablet 3   diazepam (VALIUM) 5 MG tablet TAKE 1/2 TO 1 TABLET BY MOUTH TWICE A DAY AS NEEDED 30 tablet 0   HYDROcodone-acetaminophen (NORCO) 7.5-325 MG tablet Take 1 tablet by mouth 3 (three) times daily as needed.     ibuprofen (ADVIL) 200 MG tablet Take 400 mg by mouth every 6 (six) hours  as needed for moderate pain or headache.     levothyroxine (SYNTHROID) 75 MCG tablet TAKE 1 TABLET EVERY DAY 30 tablet 0   Tiotropium Bromide-Olodaterol (STIOLTO RESPIMAT) 2.5-2.5 MCG/ACT AERS Inhale 2 puffs into the lungs daily. 1 each 5   No current facility-administered medications on file prior to visit.    There were no vitals taken for this visit.      Objective:   Physical Exam Vitals and nursing note reviewed.  Constitutional:      General: She is not in acute distress.    Appearance: Normal appearance. She is not ill-appearing.  HENT:     Head: Normocephalic and atraumatic.     Right Ear: Tympanic membrane, ear canal and external ear normal. There is no impacted cerumen.     Left Ear: Tympanic membrane, ear canal and external ear normal. There is no impacted cerumen.     Nose: Nose normal. No congestion or rhinorrhea.     Mouth/Throat:     Mouth: Mucous membranes are moist.     Pharynx: Oropharynx is clear.  Eyes:     Extraocular Movements: Extraocular movements intact.     Conjunctiva/sclera: Conjunctivae normal.     Pupils: Pupils are equal, round, and reactive to light.  Neck:     Vascular: No carotid bruit.  Cardiovascular:     Rate and Rhythm: Normal rate and regular rhythm.     Pulses: Normal pulses.     Heart sounds: No murmur heard.    No friction rub. No gallop.  Pulmonary:     Effort: Pulmonary effort is normal.     Breath sounds: Normal breath sounds.  Abdominal:     General: Abdomen is flat. Bowel sounds are normal. There is no distension.     Palpations: Abdomen is soft. There is no mass.     Tenderness: There is no abdominal tenderness. There is no guarding or rebound.     Hernia: No hernia is present.  Musculoskeletal:  General: Normal range of motion.     Cervical back: Normal range of motion and neck supple.  Lymphadenopathy:     Cervical: No cervical adenopathy.  Skin:    General: Skin is warm and dry.     Capillary Refill: Capillary  refill takes less than 2 seconds.  Neurological:     General: No focal deficit present.     Mental Status: She is alert and oriented to person, place, and time.  Psychiatric:        Mood and Affect: Mood normal.        Behavior: Behavior normal.        Thought Content: Thought content normal.        Judgment: Judgment normal.           Assessment & Plan:

## 2023-03-10 ENCOUNTER — Other Ambulatory Visit: Payer: Self-pay | Admitting: Adult Health

## 2023-03-10 DIAGNOSIS — J439 Emphysema, unspecified: Secondary | ICD-10-CM

## 2023-03-14 ENCOUNTER — Other Ambulatory Visit: Payer: Self-pay | Admitting: Adult Health

## 2023-03-14 NOTE — Telephone Encounter (Signed)
Pt needs a follow up for further refill.

## 2023-03-14 NOTE — Telephone Encounter (Signed)
Patient need to schedule an CPE for more refills. 

## 2023-03-17 ENCOUNTER — Telehealth: Payer: Self-pay

## 2023-03-17 NOTE — Telephone Encounter (Signed)
Unsuccessful attempt to reach patient on preferred number listed in notes for scheduled AWV. Unable to leave message.

## 2023-04-10 ENCOUNTER — Encounter: Payer: Self-pay | Admitting: Adult Health

## 2023-04-10 ENCOUNTER — Ambulatory Visit: Payer: Medicare HMO | Admitting: Adult Health

## 2023-04-10 VITALS — BP 132/86 | HR 82 | Temp 97.5°F | Ht 64.0 in | Wt 153.0 lb

## 2023-04-10 DIAGNOSIS — Z0000 Encounter for general adult medical examination without abnormal findings: Secondary | ICD-10-CM | POA: Diagnosis not present

## 2023-04-10 DIAGNOSIS — E018 Other iodine-deficiency related thyroid disorders and allied conditions: Secondary | ICD-10-CM

## 2023-04-10 DIAGNOSIS — J439 Emphysema, unspecified: Secondary | ICD-10-CM

## 2023-04-10 DIAGNOSIS — H8110 Benign paroxysmal vertigo, unspecified ear: Secondary | ICD-10-CM | POA: Diagnosis not present

## 2023-04-10 DIAGNOSIS — I714 Abdominal aortic aneurysm, without rupture, unspecified: Secondary | ICD-10-CM

## 2023-04-10 DIAGNOSIS — E559 Vitamin D deficiency, unspecified: Secondary | ICD-10-CM

## 2023-04-10 DIAGNOSIS — E782 Mixed hyperlipidemia: Secondary | ICD-10-CM | POA: Diagnosis not present

## 2023-04-10 DIAGNOSIS — M545 Low back pain, unspecified: Secondary | ICD-10-CM

## 2023-04-10 DIAGNOSIS — E2839 Other primary ovarian failure: Secondary | ICD-10-CM

## 2023-04-10 DIAGNOSIS — R200 Anesthesia of skin: Secondary | ICD-10-CM

## 2023-04-10 DIAGNOSIS — G8929 Other chronic pain: Secondary | ICD-10-CM

## 2023-04-10 LAB — CBC WITH DIFFERENTIAL/PLATELET
Basophils Absolute: 0 10*3/uL (ref 0.0–0.1)
Basophils Relative: 0.5 % (ref 0.0–3.0)
Eosinophils Absolute: 0.1 10*3/uL (ref 0.0–0.7)
Eosinophils Relative: 1.7 % (ref 0.0–5.0)
HCT: 48.6 % — ABNORMAL HIGH (ref 36.0–46.0)
Hemoglobin: 16 g/dL — ABNORMAL HIGH (ref 12.0–15.0)
Lymphocytes Relative: 37 % (ref 12.0–46.0)
Lymphs Abs: 2.4 10*3/uL (ref 0.7–4.0)
MCHC: 33 g/dL (ref 30.0–36.0)
MCV: 94.4 fl (ref 78.0–100.0)
Monocytes Absolute: 0.4 10*3/uL (ref 0.1–1.0)
Monocytes Relative: 5.5 % (ref 3.0–12.0)
Neutro Abs: 3.6 10*3/uL (ref 1.4–7.7)
Neutrophils Relative %: 55.3 % (ref 43.0–77.0)
Platelets: 229 10*3/uL (ref 150.0–400.0)
RBC: 5.15 Mil/uL — ABNORMAL HIGH (ref 3.87–5.11)
RDW: 13.9 % (ref 11.5–15.5)
WBC: 6.5 10*3/uL (ref 4.0–10.5)

## 2023-04-10 LAB — COMPREHENSIVE METABOLIC PANEL
ALT: 14 U/L (ref 0–35)
AST: 16 U/L (ref 0–37)
Albumin: 4.4 g/dL (ref 3.5–5.2)
Alkaline Phosphatase: 80 U/L (ref 39–117)
BUN: 15 mg/dL (ref 6–23)
CO2: 30 mEq/L (ref 19–32)
Calcium: 9.7 mg/dL (ref 8.4–10.5)
Chloride: 101 mEq/L (ref 96–112)
Creatinine, Ser: 0.86 mg/dL (ref 0.40–1.20)
GFR: 66.52 mL/min (ref >60.00)
Glucose, Bld: 101 mg/dL — ABNORMAL HIGH (ref 70–99)
Potassium: 4.2 mEq/L (ref 3.5–5.1)
Sodium: 140 mEq/L (ref 135–145)
Total Bilirubin: 0.9 mg/dL (ref 0.2–1.2)
Total Protein: 7.2 g/dL (ref 6.0–8.3)

## 2023-04-10 LAB — LIPID PANEL
Cholesterol: 157 mg/dL (ref 0–200)
HDL: 50.1 mg/dL (ref >39.00)
LDL Cholesterol: 73 mg/dL (ref 0–99)
NonHDL: 107.16
Total CHOL/HDL Ratio: 3
Triglycerides: 169 mg/dL — ABNORMAL HIGH (ref 0.0–149.0)
VLDL: 33.8 mg/dL (ref 0.0–40.0)

## 2023-04-10 LAB — VITAMIN D 25 HYDROXY (VIT D DEFICIENCY, FRACTURES): VITD: 19.81 ng/mL — ABNORMAL LOW (ref 30.00–100.00)

## 2023-04-10 LAB — VITAMIN B12: Vitamin B-12: 173 pg/mL — ABNORMAL LOW (ref 211–911)

## 2023-04-10 LAB — TSH: TSH: 0.52 u[IU]/mL (ref 0.35–5.50)

## 2023-04-10 MED ORDER — DIAZEPAM 5 MG PO TABS
ORAL_TABLET | ORAL | 2 refills | Status: DC
Start: 2023-04-10 — End: 2023-09-17

## 2023-04-10 NOTE — Progress Notes (Signed)
Subjective:    Patient ID: Stacy Garcia, female    DOB: 1950-10-04, 72 y.o.   MRN: [id number]  HPI Patient presents for yearly preventative medicine examination. She is a pleasant 72 year old female who  has a past medical history of AAA (abdominal aortic aneurysm) (HCC), Arthritis, Blood transfusion without reported diagnosis, COPD (chronic obstructive pulmonary disease) (HCC), Dysmenorrhea, Dyspnea (01/28/2017), Emphysema of lung (HCC), GERD (gastroesophageal reflux disease), Hyperlipidemia, Hypothyroidism, Thyroid disease, Tobacco abuse, and Vertigo.  Hypothyroidism - takes synthroid 100 mcg daily. She feels controlled on this dose  Lab Results  Component Value Date   TSH 0.23 (L) 01/22/2022    Hyperlipidemia - managed with lipitor 40 mg and ASA 81 mg daily. She denies myalgia or fatigue  Lab Results  Component Value Date   CHOL 175 01/22/2022   HDL 51.30 01/22/2022   LDLCALC 98 01/22/2022   LDLDIRECT 132.0 09/15/2018   TRIG 130.0 01/22/2022   CHOLHDL 3 01/22/2022    COPD - is followed by pulmonary on a routine basis. She is not currently using any inhalers and feels well controlled without them   H/o Vertigo- takes Valium 2.5 mg to 5 mg PRN   AAA- has been stable at 3 cm - s/p repair. Is seen by Vascular surgery routinely   Vitamin D Deficiency - was on Vitamin D 50,000 units weekly but stopped taking this. This was prescribed by pain management  Last vitamin D Lab Results  Component Value Date   VD25OH 35.56 01/22/2022     Chronic low back pain - managed by pain management - Taking Norco 7.5 mg   Numbness and tingling in feet - reports that this has been present for the last few weeks to months. She has to get up and walk to make it feel better. She does have a history of low back pain due to degenerative changes   All immunizations and health maintenance protocols were reviewed with the patient and needed orders were placed.  Appropriate screening laboratory  values were ordered for the patient including screening of hyperlipidemia, renal function and hepatic function.  Medication reconciliation,  past medical history, social history, problem list and allergies were reviewed in detail with the patient  Goals were established with regard to weight loss, exercise, and  diet in compliance with medications Wt Readings from Last 3 Encounters:  04/10/23 153 lb (69.4 kg)  02/07/23 154 lb (69.9 kg)  10/24/22 151 lb 9.6 oz (68.8 kg)   She refuses colon cancer screening - she is not a candidate for cologuard. She does not want lung cancer screening. She will do Dexa Scan  Review of Systems  Constitutional: Negative.   HENT: Negative.    Eyes: Negative.   Respiratory: Negative.    Cardiovascular: Negative.   Gastrointestinal: Negative.   Endocrine: Negative.   Genitourinary: Negative.   Musculoskeletal:  Positive for back pain.  Skin: Negative.   Allergic/Immunologic: Negative.   Neurological:  Positive for numbness.  Hematological: Negative.   Psychiatric/Behavioral: Negative.     Past Medical History:  Diagnosis Date   AAA (abdominal aortic aneurysm) (HCC)    Arthritis    Blood transfusion without reported diagnosis    COPD (chronic obstructive pulmonary disease) (HCC)    Dysmenorrhea    Dyspnea 01/28/2017   just recently started wheezing   Emphysema of lung (HCC)    GERD (gastroesophageal reflux disease)    Hyperlipidemia    Hypothyroidism    Thyroid disease  Tobacco abuse    Vertigo     Social History   Socioeconomic History   Marital status: Married    Spouse name: Not on file   Number of children: 3   Years of education: Not on file   Highest education level: Not on file  Occupational History   Occupation: Designer/manufacturing    Comment: Runner, broadcasting/film/video; retired 2017   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  Tobacco Use   Smoking status: Former    Current packs/day: 0.00    Average packs/day: 1  pack/day for 50.0 years (50.0 ttl pk-yrs)    Types: Cigarettes    Start date: 01/01/1967    Quit date: 12/31/2016    Years since quitting: 6.2   Smokeless tobacco: Never  Vaping Use   Vaping status: Never Used  Substance and Sexual Activity   Alcohol use: No    Alcohol/week: 0.0 standard drinks of alcohol   Drug use: No   Sexual activity: Yes    Birth control/protection: Surgical  Other Topics Concern   Not on file  Social History Narrative   11/03/2018:   Lives with husband in 2 story home   Has 3 children, 7 grandchildren, 9 great-grandchildren, all of whom live close by   Takes care of two of her great grandchildren (82 yo and 5yo)   Engineer, manufacturing working, 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   Social Determinants of Health   Financial Resource Strain: Low Risk  (04/02/2022)   Overall Financial Resource Strain (CARDIA)    Difficulty of Paying Living Expenses: Not hard at all  Food Insecurity: No Food Insecurity (04/02/2022)   Hunger Vital Sign    Worried About Running Out of Food in the Last Year: Never true    Ran Out of Food in the Last Year: Never true  Transportation Needs: No Transportation Needs (04/02/2022)   PRAPARE - Administrator, Civil Service (Medical): No    Lack of Transportation (Non-Medical): No  Physical Activity: Inactive (04/02/2022)   Exercise Vital Sign    Days of Exercise per Week: 0 days    Minutes of Exercise per Session: 0 min  Stress: No Stress Concern Present (04/02/2022)   [employer] of Occupational Health - Occupational Stress Questionnaire    Feeling of Stress : Not at all  Social Connections: Socially Integrated (03/20/2021)   Social Connection and Isolation Panel [NHANES]    Frequency of Communication with Friends and Family: Once a week    Frequency of Social Gatherings with Friends and Family: More than three times a week    Attends Religious Services: More than 4 times per year    Active  Member of Clubs or Organizations: Yes    Attends Banker Meetings: 1 to 4 times per year    Marital Status: Married  Catering manager Violence: Not At Risk (03/20/2021)   Humiliation, Afraid, Rape, and Kick questionnaire    Fear of Current or Ex-Partner: No    Emotionally Abused: No    Physically Abused: No    Sexually Abused: No    Past Surgical History:  Procedure Laterality Date   ABDOMINAL AORTIC ANEURYSM REPAIR N/A 02/05/2017   Procedure: ANEURYSM ABDOMINAL AORTIC REPAIR;  Surgeon: Sherren Kerns, MD;  Location: [facility] OR;  Service: Vascular;  Laterality: N/A;   CESAREAN SECTION     x 3   INSERTION OF MESH N/A 09/13/2020   Procedure: INSERTION OF  MESH;  Surgeon: Axel Filler, MD;  Location: [facility] OR;  Service: General;  Laterality: N/A;   JOINT REPLACEMENT     ROBOTIC INCISIONAL HERNIA REPAIR WITH MESH RETRORECTUS REPAIR (N/  09/13/2020   SHOULDER SURGERY  02/2015   TOTAL KNEE ARTHROPLASTY Right 01/23/2016   Procedure: TOTAL KNEE ARTHROPLASTY;  Surgeon: Valeria Batman, MD;  Location: [facility] OR;  Service: Orthopedics;  Laterality: Right;   TUBAL LIGATION     XI ROBOTIC ASSISTED VENTRAL HERNIA N/A 09/13/2020   Procedure: ROBOTIC INCISIONAL HERNIA REPAIR WITH MESH;  Surgeon: Axel Filler, MD;  Location: [facility] OR;  Service: General;  Laterality: N/A;    Family History  Problem Relation Age of Onset   Thyroid disease Mother    Thyroid disease Brother    Thyroid disease Other    Diabetes Other    Hyperlipidemia Other    Hypertension Other    Colon cancer Neg Hx    Colon polyps Neg Hx    Esophageal cancer Neg Hx    Rectal cancer Neg Hx    Stomach cancer Neg Hx     No Known Allergies  Current Outpatient Medications on File Prior to Visit  Medication Sig Dispense Refill   albuterol (VENTOLIN HFA) 108 (90 Base) MCG/ACT inhaler Inhale 2 puffs into the lungs every 6 (six) hours as needed for wheezing or shortness of breath. 1 each 5   aspirin 325 MG EC tablet Take 325  mg by mouth daily.     atorvastatin (LIPITOR) 40 MG tablet TAKE 1 TABLET EVERY DAY 90 tablet 3   diazepam (VALIUM) 5 MG tablet TAKE 1/2 TO 1 TABLET BY MOUTH TWICE A DAY AS NEEDED 30 tablet 0   HYDROcodone-acetaminophen (NORCO) 7.5-325 MG tablet Take 1 tablet by mouth 3 (three) times daily as needed.     ibuprofen (ADVIL) 200 MG tablet Take 400 mg by mouth every 6 (six) hours as needed for moderate pain or headache.     levothyroxine (SYNTHROID) 75 MCG tablet TAKE 1 TABLET EVERY DAY 30 tablet 11   Tiotropium Bromide-Olodaterol (STIOLTO RESPIMAT) 2.5-2.5 MCG/ACT AERS Inhale 2 puffs into the lungs daily. 1 each 5   No current facility-administered medications on file prior to visit.    BP 132/86   Pulse 82   Temp (!) 97.5 F (36.4 C) (Oral)   Ht 5\' 4"  (1.626 m)   Wt 153 lb (69.4 kg)   SpO2 96%   BMI 26.26 kg/m       Objective:   Physical Exam Vitals and nursing note reviewed.  Constitutional:      General: She is not in acute distress.    Appearance: Normal appearance. She is not ill-appearing.  HENT:     Head: Normocephalic and atraumatic.     Right Ear: Tympanic membrane, ear canal and external ear normal. There is no impacted cerumen.     Left Ear: Tympanic membrane, ear canal and external ear normal. There is no impacted cerumen.     Nose: Nose normal. No congestion or rhinorrhea.     Mouth/Throat:     Mouth: Mucous membranes are moist.     Pharynx: Oropharynx is clear.  Eyes:     Extraocular Movements: Extraocular movements intact.     Conjunctiva/sclera: Conjunctivae normal.     Pupils: Pupils are equal, round, and reactive to light.  Neck:     Vascular: No carotid bruit.  Cardiovascular:     Rate and Rhythm: Normal rate and regular rhythm.  Pulses: Normal pulses.     Heart sounds: No murmur heard.    No friction rub. No gallop.  Pulmonary:     Effort: Pulmonary effort is normal.     Breath sounds: Normal breath sounds.  Abdominal:     General: Abdomen is  flat. Bowel sounds are normal. There is no distension.     Palpations: Abdomen is soft. There is no mass.     Tenderness: There is no abdominal tenderness. There is no guarding or rebound.     Hernia: No hernia is present.  Musculoskeletal:        General: Normal range of motion.     Cervical back: Normal range of motion and neck supple.  Lymphadenopathy:     Cervical: No cervical adenopathy.  Skin:    General: Skin is warm and dry.     Capillary Refill: Capillary refill takes less than 2 seconds.  Neurological:     General: No focal deficit present.     Mental Status: She is alert and oriented to person, place, and time.  Psychiatric:        Mood and Affect: Mood normal.        Behavior: Behavior normal.        Thought Content: Thought content normal.        Judgment: Judgment normal.       Assessment & Plan:  1. Routine general medical examination at a health care facility Today patient counseled on age appropriate routine health concerns for screening and prevention, each reviewed and up to date or declined. Immunizations reviewed and up to date or declined. Labs ordered and reviewed. Risk factors for depression reviewed and negative. Hearing function and visual acuity are intact. ADLs screened and addressed as needed. Functional ability and level of safety reviewed and appropriate. Education, counseling and referrals performed based on assessed risks today. Patient provided with a copy of personalized plan for preventive services. - Follow up in one year   2. Mixed hyperlipidemia - Consider increase in lipitor  - CBC with Differential/Platelet; Future - Comprehensive metabolic panel; Future - Lipid panel; Future - TSH; Future  3. Abdominal aortic aneurysm (AAA) without rupture, unspecified part (HCC) - Per vascular surgery   4. Pulmonary emphysema, unspecified emphysema type (HCC) - Per pulmonary   5. HYPOTHYROIDISM, POST-RADIOACTIVE IODINE - Consider dose change of  synthroid  - CBC with Differential/Platelet; Future - Comprehensive metabolic panel; Future - Lipid panel; Future - TSH; Future  6. Benign paroxysmal positional vertigo, unspecified laterality  - diazepam (VALIUM) 5 MG tablet; TAKE 1/2 TO 1 TABLET BY MOUTH TWICE A DAY AS NEEDED  Dispense: 30 tablet; Refill: 2 - CBC with Differential/Platelet; Future - Comprehensive metabolic panel; Future - Lipid panel; Future - TSH; Future  7. Vitamin D deficiency  - CBC with Differential/Platelet; Future - Comprehensive metabolic panel; Future - Lipid panel; Future - TSH; Future - VITAMIN D 25 Hydroxy (Vit-D Deficiency, Fractures); Future  8. Chronic midline low back pain without sciatica - Per pain management  - CBC with Differential/Platelet; Future - Comprehensive metabolic panel; Future - Lipid panel; Future - TSH; Future  9. Estrogen deficiency  - DG Bone Density; Future - VITAMIN D 25 Hydroxy (Vit-D Deficiency, Fractures); Future  10. Numbness in feet - Will check labs including B12 - Consider imaging of back  - CBC with Differential/Platelet; Future - Comprehensive metabolic panel; Future - Lipid panel; Future - TSH; Future - Vitamin B12; Future  Shirline Frees, NP

## 2023-04-22 ENCOUNTER — Emergency Department (HOSPITAL_COMMUNITY)
Admission: EM | Admit: 2023-04-22 | Discharge: 2023-04-22 | Disposition: A | Discharge status: Home / Self Care | Payer: Medicare HMO | Attending: Emergency Medicine | Admitting: Emergency Medicine

## 2023-04-22 ENCOUNTER — Encounter (HOSPITAL_COMMUNITY): Payer: Self-pay

## 2023-04-22 ENCOUNTER — Emergency Department (HOSPITAL_COMMUNITY): Payer: Medicare HMO

## 2023-04-22 ENCOUNTER — Other Ambulatory Visit: Payer: Self-pay

## 2023-04-22 DIAGNOSIS — J441 Chronic obstructive pulmonary disease with (acute) exacerbation: Secondary | ICD-10-CM | POA: Diagnosis not present

## 2023-04-22 DIAGNOSIS — E039 Hypothyroidism, unspecified: Secondary | ICD-10-CM | POA: Diagnosis not present

## 2023-04-22 DIAGNOSIS — Z1152 Encounter for screening for COVID-19: Secondary | ICD-10-CM | POA: Diagnosis not present

## 2023-04-22 DIAGNOSIS — R0602 Shortness of breath: Secondary | ICD-10-CM | POA: Diagnosis present

## 2023-04-22 DIAGNOSIS — R000 Tachycardia, unspecified: Secondary | ICD-10-CM | POA: Diagnosis not present

## 2023-04-22 DIAGNOSIS — D72829 Elevated white blood cell count, unspecified: Secondary | ICD-10-CM | POA: Insufficient documentation

## 2023-04-22 DIAGNOSIS — Z7982 Long term (current) use of aspirin: Secondary | ICD-10-CM | POA: Insufficient documentation

## 2023-04-22 DIAGNOSIS — Z79890 Hormone replacement therapy (postmenopausal): Secondary | ICD-10-CM | POA: Diagnosis not present

## 2023-04-22 DIAGNOSIS — J40 Bronchitis, not specified as acute or chronic: Secondary | ICD-10-CM | POA: Insufficient documentation

## 2023-04-22 LAB — CBC WITH DIFFERENTIAL/PLATELET
Abs Immature Granulocytes: 0.04 10*3/uL (ref 0.00–0.07)
Basophils Absolute: 0 10*3/uL (ref 0.0–0.1)
Basophils Relative: 0 %
Eosinophils Absolute: 0.3 10*3/uL (ref 0.0–0.5)
Eosinophils Relative: 3 %
HCT: 50.3 % — ABNORMAL HIGH (ref 36.0–46.0)
Hemoglobin: 16.5 g/dL — ABNORMAL HIGH (ref 12.0–15.0)
Immature Granulocytes: 0 %
Lymphocytes Relative: 13 %
Lymphs Abs: 1.7 10*3/uL (ref 0.7–4.0)
MCH: 30.7 pg (ref 26.0–34.0)
MCHC: 32.8 g/dL (ref 30.0–36.0)
MCV: 93.5 fL (ref 80.0–100.0)
Monocytes Absolute: 0.4 10*3/uL (ref 0.1–1.0)
Monocytes Relative: 3 %
Neutro Abs: 10.2 10*3/uL — ABNORMAL HIGH (ref 1.7–7.7)
Neutrophils Relative %: 81 %
Platelets: 194 10*3/uL (ref 150–400)
RBC: 5.38 MIL/uL — ABNORMAL HIGH (ref 3.87–5.11)
RDW: 13 % (ref 11.5–15.5)
WBC: 12.8 10*3/uL — ABNORMAL HIGH (ref 4.0–10.5)
nRBC: 0 % (ref 0.0–0.2)

## 2023-04-22 LAB — BASIC METABOLIC PANEL
Anion gap: 10 (ref 5–15)
BUN: 13 mg/dL (ref 8–23)
CO2: 25 mmol/L (ref 22–32)
Calcium: 9.2 mg/dL (ref 8.9–10.3)
Chloride: 102 mmol/L (ref 98–111)
Creatinine, Ser: 0.82 mg/dL (ref 0.44–1.00)
GFR, Estimated: >60 mL/min (ref >60)
Glucose, Bld: 126 mg/dL — ABNORMAL HIGH (ref 70–99)
Potassium: 3.5 mmol/L (ref 3.5–5.1)
Sodium: 137 mmol/L (ref 135–145)

## 2023-04-22 LAB — BRAIN NATRIURETIC PEPTIDE: B Natriuretic Peptide: 33.8 pg/mL (ref 0.0–100.0)

## 2023-04-22 LAB — SARS CORONAVIRUS 2 BY RT PCR: SARS Coronavirus 2 by RT PCR: NEGATIVE

## 2023-04-22 MED ORDER — PREDNISONE 50 MG PO TABS: 50.0000 mg | ORAL_TABLET | Freq: Every day | ORAL | 0 refills | Status: DC

## 2023-04-22 MED ORDER — METHYLPREDNISOLONE SODIUM SUCC 125 MG IJ SOLR
125.0000 mg | Freq: Once | INTRAMUSCULAR | Status: AC
  Administered 2023-04-22: 125 mg via INTRAVENOUS
  Filled 2023-04-22: qty 2

## 2023-04-22 MED ORDER — ALBUTEROL SULFATE (2.5 MG/3ML) 0.083% IN NEBU
2.5000 mg | INHALATION_SOLUTION | Freq: Once | RESPIRATORY_TRACT | Status: AC
  Administered 2023-04-22: 2.5 mg via RESPIRATORY_TRACT
  Filled 2023-04-22: qty 3

## 2023-04-22 MED ORDER — IPRATROPIUM-ALBUTEROL 0.5-2.5 (3) MG/3ML IN SOLN
3.0000 mL | Freq: Once | RESPIRATORY_TRACT | Status: AC
  Administered 2023-04-22: 3 mL via RESPIRATORY_TRACT
  Filled 2023-04-22: qty 3

## 2023-04-22 MED ORDER — AEROCHAMBER Z-STAT PLUS/MEDIUM MISC
1.0000 | Freq: Once | Status: DC
  Filled 2023-04-22: qty 1

## 2023-04-22 MED ORDER — AEROCHAMBER PLUS FLO-VU MEDIUM MISC
1.0000 | Freq: Once | Status: AC
  Administered 2023-04-22: 1
  Filled 2023-04-22: qty 1

## 2023-04-22 MED ORDER — AEROCHAMBER PLUS FLO-VU MISC: 1.0000 | Freq: Once | Status: DC

## 2023-04-22 MED ORDER — DOXYCYCLINE HYCLATE 100 MG PO CAPS: 100.0000 mg | ORAL_CAPSULE | Freq: Two times a day (BID) | ORAL | 0 refills | Status: DC

## 2023-04-22 MED ORDER — DOXYCYCLINE HYCLATE 100 MG PO TABS
100.0000 mg | ORAL_TABLET | Freq: Once | ORAL | Status: AC
  Administered 2023-04-22: 100 mg via ORAL
  Filled 2023-04-22: qty 1

## 2023-04-22 MED ORDER — ALBUTEROL SULFATE HFA 108 (90 BASE) MCG/ACT IN AERS
1.0000 | INHALATION_SPRAY | Freq: Once | RESPIRATORY_TRACT | Status: AC
  Administered 2023-04-22: 2 via RESPIRATORY_TRACT
  Filled 2023-04-22: qty 6.7

## 2023-04-22 NOTE — ED Triage Notes (Signed)
Pt came in via POV d/t a "couple of hours ago" starting to have problems breathing. Upon arrival to ED pt is A/Ox4 & is audibly wheezing with each exhalation. Denies CP does report having an inhaler at home but has not use it & states she quit smoking 6 years ago.

## 2023-04-22 NOTE — ED Provider Notes (Signed)
[facility] AT [facility] Provider Note   CSN: [id number] Arrival date & time: 04/22/23  0825     History  Chief Complaint  Patient presents with   Shortness of Breath    Stacy Garcia is a 72 y.o. female.  Pt is a 72 yo female with pmhx significant for hld, copd, and hypothyroidism.   Pt said she started getting sob a few hours ago.  She has had a cough. She does have an inhaler, but did not use it.  She denies fevers.  She no longer smokes.       Home Medications Prior to Admission medications   Medication Sig Start Date End Date Taking? Authorizing Provider  doxycycline (VIBRAMYCIN) 100 MG capsule Take 1 capsule (100 mg total) by mouth 2 (two) times daily. 04/22/23  Yes Jacalyn Lefevre, MD  predniSONE (DELTASONE) 50 MG tablet Take 1 tablet (50 mg total) by mouth daily with breakfast. 04/22/23  Yes Jacalyn Lefevre, MD  albuterol (VENTOLIN HFA) 108 (90 Base) MCG/ACT inhaler Inhale 2 puffs into the lungs every 6 (six) hours as needed for wheezing or shortness of breath. 07/02/22   Coralyn Helling, MD  aspirin 325 MG EC tablet Take 325 mg by mouth daily.    [provider]  atorvastatin (LIPITOR) 40 MG tablet TAKE 1 TABLET EVERY DAY 04/10/22   Nafziger, Kandee Keen, NP  diazepam (VALIUM) 5 MG tablet TAKE 1/2 TO 1 TABLET BY MOUTH TWICE A DAY AS NEEDED 04/10/23   Nafziger, Kandee Keen, NP  HYDROcodone-acetaminophen (NORCO) 7.5-325 MG tablet Take 1 tablet by mouth 3 (three) times daily as needed. 06/13/22   [provider]  ibuprofen (ADVIL) 200 MG tablet Take 400 mg by mouth every 6 (six) hours as needed for moderate pain or headache.    [provider]  levothyroxine (SYNTHROID) 75 MCG tablet TAKE 1 TABLET EVERY DAY 03/14/23   Nafziger, Kandee Keen, NP  Tiotropium Bromide-Olodaterol (STIOLTO RESPIMAT) 2.5-2.5 MCG/ACT AERS Inhale 2 puffs into the lungs daily. 07/02/22   Coralyn Helling, MD      Allergies    Patient has no known allergies.    Review of  Systems   Review of Systems  Respiratory:  Positive for cough and shortness of breath.   All other systems reviewed and are negative.   Physical Exam Updated Vital Signs BP 123/88   Pulse (!) 104   Temp (!) 97.5 F (36.4 C) (Oral)   Resp 20   SpO2 98%  Physical Exam Vitals and nursing note reviewed.  Constitutional:      Appearance: She is well-developed.  HENT:     Head: Normocephalic and atraumatic.     Mouth/Throat:     Mouth: Mucous membranes are moist.     Pharynx: Oropharynx is clear.  Eyes:     Extraocular Movements: Extraocular movements intact.     Pupils: Pupils are equal, round, and reactive to light.  Cardiovascular:     Rate and Rhythm: Regular rhythm. Tachycardia present.  Pulmonary:     Effort: Tachypnea present.     Breath sounds: Wheezing present.  Abdominal:     General: Bowel sounds are normal.     Palpations: Abdomen is soft.  Musculoskeletal:        General: Normal range of motion.     Cervical back: Normal range of motion and neck supple.  Skin:    General: Skin is warm.     Capillary Refill: Capillary refill takes less than 2  seconds.  Neurological:     General: No focal deficit present.     Mental Status: She is alert and oriented to person, place, and time.  Psychiatric:        Mood and Affect: Mood normal.        Behavior: Behavior normal.     ED Results / Procedures / Treatments   Labs (all labs ordered are listed, but only abnormal results are displayed) Labs Reviewed  BASIC METABOLIC PANEL - Abnormal; Notable for the following components:      Result Value   Glucose, Bld 126 (*)    All other components within normal limits  CBC WITH DIFFERENTIAL/PLATELET - Abnormal; Notable for the following components:   WBC 12.8 (*)    RBC 5.38 (*)    Hemoglobin 16.5 (*)    HCT 50.3 (*)    Neutro Abs 10.2 (*)    All other components within normal limits  SARS CORONAVIRUS 2 BY RT PCR  BRAIN NATRIURETIC PEPTIDE    EKG EKG  Interpretation Date/Time:  Tuesday April 22 2023 08:34:05 EDT Ventricular Rate:  90 PR Interval:  169 QRS Duration:  82 QT Interval:  354 QTC Calculation: 434 R Axis:   73  Text Interpretation: Sinus rhythm Minimal ST depression, diffuse leads No significant change since last tracing Confirmed by Jacalyn Lefevre [phone]) on 04/22/2023 9:35:45 AM  Radiology DG Chest Port 1 View  Result Date: 04/22/2023 CLINICAL DATA:  Shortness of breath EXAM: PORTABLE CHEST 1 VIEW COMPARISON:  Previous studies including the examination of 07/02/2022 FINDINGS: Transverse diameter of heart is within normal limits. There are no signs of pulmonary edema or focal pulmonary consolidation. There is no pleural effusion or pneumothorax. Degenerative changes are noted in right shoulder. Deformity in the lateral end of right clavicle and increased right AC joint space may be residual from previous injury. IMPRESSION: No active cardiopulmonary disease. Electronically Signed   By: Ernie Avena M.D.   On: 04/22/2023 10:11    Procedures Procedures    Medications Ordered in ED Medications  albuterol (VENTOLIN HFA) 108 (90 Base) MCG/ACT inhaler 1-2 puff (has no administration in time range)  aerochamber Z-Stat Plus/medium 1 each (has no administration in time range)  doxycycline (VIBRA-TABS) tablet 100 mg (has no administration in time range)  ipratropium-albuterol (DUONEB) 0.5-2.5 (3) MG/3ML nebulizer solution 3 mL (3 mLs Nebulization Given 04/22/23 0904)  methylPREDNISolone sodium succinate (SOLU-MEDROL) 125 mg/2 mL injection 125 mg (125 mg Intravenous Given 04/22/23 0903)  albuterol (PROVENTIL) (2.5 MG/3ML) 0.083% nebulizer solution 2.5 mg (2.5 mg Nebulization Given 04/22/23 1031)    ED Course/ Medical Decision Making/ A&P                                 Medical Decision Making Amount and/or Complexity of Data Reviewed Labs: ordered. Radiology: ordered.  Risk Prescription drug management.   This  patient presents to the ED for concern of sob, this involves an extensive number of treatment options, and is a complaint that carries with it a high risk of complications and morbidity.  The differential diagnosis includes pna, covid, copd exac   Co morbidities that complicate the patient evaluation  hld, copd, and hypothyroidism   Additional history obtained:  Additional history obtained from epic chart review  Lab Tests:  I Ordered, and personally interpreted labs.  The pertinent results include:  cbc with wbc elevated at 12.8, bmp nl, bnp nl,  covid neg   Imaging Studies ordered:  I ordered imaging studies including cxr  I independently visualized and interpreted imaging which showed No active cardiopulmonary disease.  I agree with the radiologist interpretation   Cardiac Monitoring:  The patient was maintained on a cardiac monitor.  I personally viewed and interpreted the cardiac monitored which showed an underlying rhythm of: nsr   Medicines ordered and prescription drug management:  I ordered medication including solumedrol/duoneb  for sob  Reevaluation of the patient after these medicines showed that the patient improved I have reviewed the patients home medicines and have made adjustments as needed   Test Considered:  ct   Critical Interventions:  Nebs/steroids   Problem List / ED Course:  COPD exac:  pt feels much better after treatment.  She feels like her breathing is back to nl.  She was ambulated and sats stayed above 97%.  She is given an inhaler + spacer as she said hers was a few years old.  Pt is stable for d/c.  Return if worse.  F/u with pcp.   Reevaluation:  After the interventions noted above, I reevaluated the patient and found that they have :improved   Social Determinants of Health:  Lives at home   Dispostion:  After consideration of the diagnostic results and the patients response to treatment, I feel that the patent would  benefit from discharge with outpatient f/u.          Final Clinical Impression(s) / ED Diagnoses Final diagnoses:  COPD exacerbation (HCC)  Bronchitis    Rx / DC Orders ED Discharge Orders          Ordered    doxycycline (VIBRAMYCIN) 100 MG capsule  2 times daily        04/22/23 1214    predniSONE (DELTASONE) 50 MG tablet  Daily with breakfast        04/22/23 1214              Jacalyn Lefevre, MD 04/22/23 1215

## 2023-05-14 ENCOUNTER — Other Ambulatory Visit: Payer: Self-pay | Admitting: Adult Health

## 2023-05-22 ENCOUNTER — Ambulatory Visit (INDEPENDENT_AMBULATORY_CARE_PROVIDER_SITE_OTHER): Payer: Medicare HMO | Admitting: Family Medicine

## 2023-05-22 DIAGNOSIS — Z0000 Encounter for general adult medical examination without abnormal findings: Secondary | ICD-10-CM | POA: Diagnosis not present

## 2023-05-22 NOTE — Patient Instructions (Signed)
I really enjoyed getting to talk with you today! I am available on Tuesdays and Thursdays for virtual visits if you have any questions or concerns, or if I can be of any further assistance.   CHECKLIST FROM ANNUAL WELLNESS VISIT:  -Follow up (please call to schedule if not scheduled after visit):   -yearly for annual wellness visit with primary care office  Here is a list of your preventive care/health maintenance measures and the plan for each if any are due:  PLAN For any measures below that may be due:   Health Maintenance  Topic Date Due   DEXA SCAN  Never done   INFLUENZA VACCINE  04/10/2023   COVID-19 Vaccine (5 - 2023-24 season) 06/07/2023 (Originally 05/11/2023)   Zoster Vaccines- Shingrix (1 of 2) 08/21/2023 (Originally 10/12/1969)   Lung Cancer Screening  04/09/2024 (Originally 10/11/1998)   Colonoscopy  04/09/2024 (Originally 01/06/2021)   Medicare Annual Wellness (AWV)  05/21/2024   MAMMOGRAM  09/19/2024   DTaP/Tdap/Td (4 - Td or Tdap) 04/12/2031   Pneumonia Vaccine 40+ Years old  Completed   Hepatitis C Screening  Completed   HPV VACCINES  Aged Out    -See a dentist at least yearly  -Get your eyes checked and then per your eye specialist's recommendations  -Other issues addressed today:   -I have included below further information regarding a healthy whole foods based diet, physical activity guidelines for adults, stress management and opportunities for social connections. I hope you find this information useful.   -----------------------------------------------------------------------------------------------------------------------------------------------------------------------------------------------------------------------------------------------------------  NUTRITION: -eat real food: lots of colorful vegetables (half the plate) and fruits -5-7 servings of vegetables and fruits per day (fresh or steamed is best), exp. 2 servings of vegetables with lunch and dinner  and 2 servings of fruit per day. Berries and greens such as kale and collards are great choices.  -consume on a regular basis: whole grains (make sure first ingredient on label contains the word "whole"), fresh fruits, fish, nuts, seeds, healthy oils (such as olive oil, avocado oil, grape seed oil) -may eat small amounts of dairy and lean meat on occasion, but avoid processed meats such as ham, bacon, lunch meat, etc. -drink water -try to avoid fast food and pre-packaged foods, processed meat -most experts advise limiting sodium to < 2300mg  per day, should limit further is any chronic conditions such as high blood pressure, heart disease, diabetes, etc. The American Heart Association advised that < 1500mg  is is ideal -try to avoid foods that contain any ingredients with names you do not recognize  -try to avoid sugar/sweets (except for the natural sugar that occurs in fresh fruit) -try to avoid sweet drinks -try to avoid white rice, white bread, pasta (unless whole grain), white or yellow potatoes  EXERCISE GUIDELINES FOR ADULTS: -if you wish to increase your physical activity, do so gradually and with the approval of your doctor -STOP and seek medical care immediately if you have any chest pain, chest discomfort or trouble breathing when starting or increasing exercise  -move and stretch your body, legs, feet and arms when sitting for long periods -Physical activity guidelines for optimal health in adults: -least 150 minutes per week of aerobic exercise (can talk, but not sing) once approved by your doctor, 20-30 minutes of sustained activity or two 10 minute episodes of sustained activity every day.  -resistance training at least 2 days per week if approved by your doctor -balance exercises 3+ days per week:   Stand somewhere where you have something  sturdy to hold onto if you lose balance.    1) lift up on toes, start with 5x per day and work up to 20x   2) stand and lift on leg straight out  to the side so that foot is a few inches of the floor, start with 5x each side and work up to 20x each side   3) stand on one foot, start with 5 seconds each side and work up to 20 seconds on each side  If you need ideas or help with getting more active:  -Silver sneakers https://tools.silversneakers.com  -Walk with a Doc: http://www.duncan-[email or web address]/  -try to include resistance (weight lifting/strength building) and balance exercises twice per week: or the following link for ideas: http://castillo-powell.com/  BuyDucts.dk  STRESS MANAGEMENT: -can try meditating, or just sitting quietly with deep breathing while intentionally relaxing all parts of your body for 5 minutes daily -if you need further help with stress, anxiety or depression please follow up with your primary doctor or contact the wonderful folks at [facility]: [phone]  SOCIAL CONNECTIONS: -options in [place] if you wish to engage in more social and exercise related activities:  -Silver sneakers https://tools.silversneakers.com  -Walk with a Doc: http://www.duncan-[email or web address]/  -Check out the [facility] Active Adults 50+ section on the [place] of [employer] (hiking clubs, book clubs, cards and games, chess, exercise classes, aquatic classes and much more) - see the website for details: https://www.[place].gov/departments/parks-recreation/active-adults50  -YouTube has lots of exercise videos for different ages and abilities as well  -[facility] (a variety of indoor and outdoor inperson activities for adults). [phone]. [address]  -Virtual Online Classes (a variety of topics): see seniorplanet.org or call [phone]  -consider volunteering at a school, hospice center, church, senior center or elsewhere

## 2023-05-22 NOTE — Progress Notes (Signed)
PATIENT CHECK-IN and HEALTH RISK ASSESSMENT QUESTIONNAIRE:  -completed by phone/video for upcoming Medicare Preventive Visit  Pre-Visit Check-in: 1)Vitals (height, wt, BP, etc) - record in vitals section for visit on day of visit Request home vitals (wt, BP, etc.) and enter into vitals, THEN update Vital Signs SmartPhrase below at the top of the HPI. See below.  2)Review and Update Medications, Allergies PMH, Surgeries, Social history in Epic 3)Hospitalizations in the last year with date/reason?  none 4)Review and Update Care Team (patient's specialists) in Epic 5) Complete PHQ9 in Epic  6) Complete Fall Screening in Epic 7)Review all Health Maintenance Due and order under PCP if not done.  8)Medicare Wellness Questionnaire: Answer theses question about your habits: Do you drink alcohol?  no Have you ever smoked? No, smoked for 54 years in the past but quit 6.5 years ago Do you use an illicit drugs? no Do you exercises?  Walks daily for about 15-20 minutes Nutrition/Diet: a light eater, hear and there, peanut butter crackers, not many fruits and veggies, eats some salad and protein  Answer theses question about you: Can you perform most household chores? yes Do you find it hard to follow a conversation in a noisy room? no Do you often ask people to speak up or repeat themselves? no Do you feel that you have a problem with memory? no Do you balance your checkbook and or bank acounts? yes Do you feel safe at home? yes Last dentist visit? august Do you need assistance with any of the following: Please note if so - none  Driving?  Feeding yourself?  Getting from bed to chair?  Getting to the toilet?  Bathing or showering?  Dressing yourself?  Managing money?  Climbing a flight of stairs  Preparing meals?  Do you have Advanced Directives in place (Living Will, Healthcare Power or Attorney)?  Yes   Last eye Exam and location? Sees yearly   Do you currently use prescribed or  non-prescribed narcotic or opioid pain medications?   Do you have a history or close family history of breast, ovarian, tubal or peritoneal cancer or a family member with BRCA (breast cancer susceptibility 1 and 2) gene mutations?  Request home vitals (wt, BP, etc.) and enter into vitals, THEN update Vital Signs SmartPhrase below at the top of the HPI. See below.   Nurse/Assistant Credentials/time stamp:   ----------------------------------------------------------------------------------------------------------------------------------------------------------------------------------------------------------------------  Vital Signs: Unable to obtain new vitals due to this being a telehealth visit.   MEDICARE ANNUAL PREVENTIVE VISIT WITH PROVIDER: (Welcome to Medicare, initial annual wellness or annual wellness exam)  Virtual Visit via Phone Note  I connected with Stacy Garcia on 05/22/23 by phone  and verified that I am speaking with the correct person using two identifiers.  Location patient: home Location provider:work or home office Persons participating in the virtual visit: patient, provider  Concerns and/or follow up today: none   See HM section in Epic for other details of completed HM.    ROS: negative for report of fevers, unintentional weight loss, vision changes, vision loss, hearing loss or change, chest pain, sob, hemoptysis, melena, hematochezia, hematuria, falls, bleeding or bruising, thoughts of suicide or self harm, memory loss  Patient-completed extensive health risk assessment - reviewed and discussed with the patient: See Health Risk Assessment completed with patient prior to the visit either above or in recent phone note. This was reviewed in detailed with the patient today and appropriate recommendations, orders and referrals were placed as needed per  Summary below and patient instructions.   Review of Medical History: -PMH, PSH, Family History and current  specialty and care providers reviewed and updated and listed below   Patient Care Team: Shirline Frees, NP as PCP - General (Family Medicine) Valeria Batman, MD (Inactive) as Consulting Physician (Orthopedic Surgery) Coralyn Helling, MD as Consulting Physician (Pulmonary Disease)   Past Medical History:  Diagnosis Date   AAA (abdominal aortic aneurysm) (HCC)    Arthritis    Blood transfusion without reported diagnosis    COPD (chronic obstructive pulmonary disease) (HCC)    Dysmenorrhea    Dyspnea 01/28/2017   just recently started wheezing   Emphysema of lung (HCC)    GERD (gastroesophageal reflux disease)    Hyperlipidemia    Hypothyroidism    Thyroid disease    Tobacco abuse    Vertigo     Past Surgical History:  Procedure Laterality Date   ABDOMINAL AORTIC ANEURYSM REPAIR N/A 02/05/2017   Procedure: ANEURYSM ABDOMINAL AORTIC REPAIR;  Surgeon: Sherren Kerns, MD;  Location: [facility] OR;  Service: Vascular;  Laterality: N/A;   CESAREAN SECTION     x 3   INSERTION OF MESH N/A 09/13/2020   Procedure: INSERTION OF MESH;  Surgeon: Axel Filler, MD;  Location: [facility] OR;  Service: General;  Laterality: N/A;   JOINT REPLACEMENT     ROBOTIC INCISIONAL HERNIA REPAIR WITH MESH RETRORECTUS REPAIR (N/  09/13/2020   SHOULDER SURGERY  02/2015   TOTAL KNEE ARTHROPLASTY Right 01/23/2016   Procedure: TOTAL KNEE ARTHROPLASTY;  Surgeon: Valeria Batman, MD;  Location: [facility] OR;  Service: Orthopedics;  Laterality: Right;   TUBAL LIGATION     XI ROBOTIC ASSISTED VENTRAL HERNIA N/A 09/13/2020   Procedure: ROBOTIC INCISIONAL HERNIA REPAIR WITH MESH;  Surgeon: Axel Filler, MD;  Location: [facility] OR;  Service: General;  Laterality: N/A;    Social History   Socioeconomic History   Marital status: Married    Spouse name: Not on file   Number of children: 3   Years of education: Not on file   Highest education level: Not on file  Occupational History   Occupation: Designer/manufacturing    Comment:  Runner, broadcasting/film/video; retired 2017   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  Tobacco Use   Smoking status: Former    Current packs/day: 0.00    Average packs/day: 1 pack/day for 50.0 years (50.0 ttl pk-yrs)    Types: Cigarettes    Start date: 01/01/1967    Quit date: 12/31/2016    Years since quitting: 6.3   Smokeless tobacco: Never  Vaping Use   Vaping status: Never Used  Substance and Sexual Activity   Alcohol use: No    Alcohol/week: 0.0 standard drinks of alcohol   Drug use: No   Sexual activity: Yes    Birth control/protection: Surgical  Other Topics Concern   Not on file  Social History Narrative   11/03/2018:   Lives with husband in 2 story home   Has 3 children, 7 grandchildren, 9 great-grandchildren, all of whom live close by   Takes care of two of her great grandchildren (18 yo and 5yo)   Engineer, manufacturing working, 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   Social Determinants of Health   Financial Resource Strain: Low Risk  (04/02/2022)   Overall Financial Resource Strain (CARDIA)    Difficulty of Paying Living Expenses: Not hard at all  Food Insecurity: No Food Insecurity (04/02/2022)  Hunger Vital Sign    Worried About Running Out of Food in the Last Year: Never true    Ran Out of Food in the Last Year: Never true  Transportation Needs: No Transportation Needs (04/02/2022)   PRAPARE - Administrator, Civil Service (Medical): No    Lack of Transportation (Non-Medical): No  Physical Activity: Inactive (04/02/2022)   Exercise Vital Sign    Days of Exercise per Week: 0 days    Minutes of Exercise per Session: 0 min  Stress: No Stress Concern Present (04/02/2022)   [employer] of Occupational Health - Occupational Stress Questionnaire    Feeling of Stress : Not at all  Social Connections: Socially Integrated (03/20/2021)   Social Connection and Isolation Panel [NHANES]    Frequency of Communication with Friends and  Family: Once a week    Frequency of Social Gatherings with Friends and Family: More than three times a week    Attends Religious Services: More than 4 times per year    Active Member of [employer] or Organizations: Yes    Attends Banker Meetings: 1 to 4 times per year    Marital Status: Married  Catering manager Violence: Not At Risk (03/20/2021)   Humiliation, Afraid, Rape, and Kick questionnaire    Fear of Current or Ex-Partner: No    Emotionally Abused: No    Physically Abused: No    Sexually Abused: No    Family History  Problem Relation Age of Onset   Thyroid disease Mother    Thyroid disease Brother    Thyroid disease Other    Diabetes Other    Hyperlipidemia Other    Hypertension Other    Colon cancer Neg Hx    Colon polyps Neg Hx    Esophageal cancer Neg Hx    Rectal cancer Neg Hx    Stomach cancer Neg Hx     Current Outpatient Medications on File Prior to Visit  Medication Sig Dispense Refill   albuterol (VENTOLIN HFA) 108 (90 Base) MCG/ACT inhaler Inhale 2 puffs into the lungs every 6 (six) hours as needed for wheezing or shortness of breath. 1 each 5   aspirin 325 MG EC tablet Take 325 mg by mouth daily.     atorvastatin (LIPITOR) 40 MG tablet TAKE 1 TABLET EVERY DAY 90 tablet 3   diazepam (VALIUM) 5 MG tablet TAKE 1/2 TO 1 TABLET BY MOUTH TWICE A DAY AS NEEDED 30 tablet 2   doxycycline (VIBRAMYCIN) 100 MG capsule Take 1 capsule (100 mg total) by mouth 2 (two) times daily. 14 capsule 0   HYDROcodone-acetaminophen (NORCO) 7.5-325 MG tablet Take 1 tablet by mouth 3 (three) times daily as needed.     ibuprofen (ADVIL) 200 MG tablet Take 400 mg by mouth every 6 (six) hours as needed for moderate pain or headache.     levothyroxine (SYNTHROID) 75 MCG tablet TAKE 1 TABLET EVERY DAY 30 tablet 11   predniSONE (DELTASONE) 50 MG tablet Take 1 tablet (50 mg total) by mouth daily with breakfast. 5 tablet 0   Tiotropium Bromide-Olodaterol (STIOLTO RESPIMAT) 2.5-2.5  MCG/ACT AERS Inhale 2 puffs into the lungs daily. 1 each 5   No current facility-administered medications on file prior to visit.    No Known Allergies     Physical Exam Vitals requested from patient and listed below if patient had equipment and was able to obtain at home for this virtual visit: There were no vitals filed for this  visit. Estimated body mass index is 26.26 kg/m as calculated from the following:   Height as of 04/10/23: 5\' 4"  (1.626 m).   Weight as of 04/10/23: 153 lb (69.4 kg).  EKG (optional): deferred due to virtual visit  GENERAL: alert, oriented, no acute distress detected, full vision exam deferred due to pandemic and/or virtual encounter  PSYCH/NEURO: pleasant and cooperative, no obvious depression or anxiety, speech and thought processing grossly intact, Cognitive function grossly intact  Flowsheet Row Office Visit from 04/10/2023 in [facility] [place]  PHQ-9 Total Score 11           04/10/2023   10:43 AM 10/24/2022   11:29 AM 04/02/2022    9:32 AM 01/22/2022   10:01 AM 03/20/2021    9:42 AM  Depression screen PHQ 2/9  Decreased Interest 1 0 0 0 0  Down, Depressed, Hopeless 1 0 0 0 0  PHQ - 2 Score 2 0 0 0 0  Altered sleeping 1   0   Tired, decreased energy 1   0   Change in appetite 2   0   Feeling bad or failure about yourself  0   0   Trouble concentrating 3   0   Moving slowly or fidgety/restless 2   0   Suicidal thoughts 0   0   PHQ-9 Score 11   0   Difficult doing work/chores Somewhat difficult   Not difficult at all        09/13/2020   10:48 PM 03/20/2021    9:44 AM 01/22/2022   10:00 AM 04/02/2022    9:32 AM 04/10/2023   10:42 AM  Fall Risk  Falls in the past year?  0 0 0 0  Was there an injury with Fall?  0 0 0 0  Fall Risk Category Calculator  0 0 0 0  Fall Risk Category (Retired)  Low Low Low   (RETIRED) Patient Fall Risk Level Moderate fall risk  Low fall risk Low fall risk   Patient at Risk for Falls Due to    No Fall Risks Medication side effect   Fall risk Follow up  Falls prevention discussed Falls evaluation completed Falls evaluation completed;Education provided;Falls prevention discussed Falls evaluation completed     SUMMARY AND PLAN:  Encounter for Medicare annual wellness exam   Discussed applicable health maintenance/preventive health measures and advised and referred or ordered per patient preferences: -she declined colon cancer screening and lung cancer screening -PCP had ordered DEXA but she did not want me to check on this as reports does not wish to do -dicussed vaccines due, she reports she will get the flu shot, declined other vaccines  Health Maintenance  Topic Date Due   DEXA SCAN  Never done   INFLUENZA VACCINE  04/10/2023   COVID-19 Vaccine (5 - 2023-24 season) 06/07/2023 (Originally 05/11/2023)   Zoster Vaccines- Shingrix (1 of 2) 08/21/2023 (Originally 10/12/1969)   Lung Cancer Screening  04/09/2024 (Originally 10/11/1998)   Colonoscopy  04/09/2024 (Originally 01/06/2021)   Medicare Annual Wellness (AWV)  05/21/2024   MAMMOGRAM  09/19/2024   DTaP/Tdap/Td (4 - Td or Tdap) 04/12/2031   Pneumonia Vaccine 66+ Years old  Completed   Hepatitis C Screening  Completed   HPV VACCINES  Aged [employer] and counseling on the following was provided based on the above review of health and a plan/checklist for the patient, along with additional information discussed, was provided for the  patient in the patient instructions :    -Advised and counseled on a healthy lifestyle  -Reviewed patient's current diet. Advised and counseled on a whole foods based healthy diet. A summary of a healthy diet was provided in the Patient Instructions.  -reviewed patient's current physical activity level and discussed exercise guidelines for adults. Discussed community resources and ideas for safe exercise at home to assist in meeting exercise guideline recommendations in a safe and healthy way.   -Advise yearly dental visits at minimum and regular eye exams   Follow up: see patient instructions     Patient Instructions  I really enjoyed getting to talk with you today! I am available on Tuesdays and Thursdays for virtual visits if you have any questions or concerns, or if I can be of any further assistance.   CHECKLIST FROM ANNUAL WELLNESS VISIT:  -Follow up (please call to schedule if not scheduled after visit):   -yearly for annual wellness visit with primary care office  Here is a list of your preventive care/health maintenance measures and the plan for each if any are due:  PLAN For any measures below that may be due:   Health Maintenance  Topic Date Due   DEXA SCAN  Never done   INFLUENZA VACCINE  04/10/2023   COVID-19 Vaccine (5 - 2023-24 season) 06/07/2023 (Originally 05/11/2023)   Zoster Vaccines- Shingrix (1 of 2) 08/21/2023 (Originally 10/12/1969)   Lung Cancer Screening  04/09/2024 (Originally 10/11/1998)   Colonoscopy  04/09/2024 (Originally 01/06/2021)   Medicare Annual Wellness (AWV)  05/21/2024   MAMMOGRAM  09/19/2024   DTaP/Tdap/Td (4 - Td or Tdap) 04/12/2031   Pneumonia Vaccine 43+ Years old  Completed   Hepatitis C Screening  Completed   HPV VACCINES  Aged Out    -See a dentist at least yearly  -Get your eyes checked and then per your eye specialist's recommendations  -Other issues addressed today:   -I have included below further information regarding a healthy whole foods based diet, physical activity guidelines for adults, stress management and opportunities for social connections. I hope you find this information useful.   -----------------------------------------------------------------------------------------------------------------------------------------------------------------------------------------------------------------------------------------------------------  NUTRITION: -eat real food: lots of colorful vegetables (half the plate) and  fruits -5-7 servings of vegetables and fruits per day (fresh or steamed is best), exp. 2 servings of vegetables with lunch and dinner and 2 servings of fruit per day. Berries and greens such as kale and collards are great choices.  -consume on a regular basis: whole grains (make sure first ingredient on label contains the word "whole"), fresh fruits, fish, nuts, seeds, healthy oils (such as olive oil, avocado oil, grape seed oil) -may eat small amounts of dairy and lean meat on occasion, but avoid processed meats such as ham, bacon, lunch meat, etc. -drink water -try to avoid fast food and pre-packaged foods, processed meat -most experts advise limiting sodium to < 2300mg  per day, should limit further is any chronic conditions such as high blood pressure, heart disease, diabetes, etc. The American Heart Association advised that < 1500mg  is is ideal -try to avoid foods that contain any ingredients with names you do not recognize  -try to avoid sugar/sweets (except for the natural sugar that occurs in fresh fruit) -try to avoid sweet drinks -try to avoid white rice, white bread, pasta (unless whole grain), white or yellow potatoes  EXERCISE GUIDELINES FOR ADULTS: -if you wish to increase your physical activity, do so gradually and with the approval of your doctor -STOP  and seek medical care immediately if you have any chest pain, chest discomfort or trouble breathing when starting or increasing exercise  -move and stretch your body, legs, feet and arms when sitting for long periods -Physical activity guidelines for optimal health in adults: -least 150 minutes per week of aerobic exercise (can talk, but not sing) once approved by your doctor, 20-30 minutes of sustained activity or two 10 minute episodes of sustained activity every day.  -resistance training at least 2 days per week if approved by your doctor -balance exercises 3+ days per week:   Stand somewhere where you have something sturdy to  hold onto if you lose balance.    1) lift up on toes, start with 5x per day and work up to 20x   2) stand and lift on leg straight out to the side so that foot is a few inches of the floor, start with 5x each side and work up to 20x each side   3) stand on one foot, start with 5 seconds each side and work up to 20 seconds on each side  If you need ideas or help with getting more active:  -Silver sneakers https://tools.silversneakers.com  -Walk with a Doc: http://www.duncan-williams.com/  -try to include resistance (weight lifting/strength building) and balance exercises twice per week: or the following link for ideas: http://castillo-powell.com/  BuyDucts.dk  STRESS MANAGEMENT: -can try meditating, or just sitting quietly with deep breathing while intentionally relaxing all parts of your body for 5 minutes daily -if you need further help with stress, anxiety or depression please follow up with your primary doctor or contact the wonderful folks at [facility]: [phone]  SOCIAL CONNECTIONS: -options in [place] if you wish to engage in more social and exercise related activities:  -Silver sneakers https://tools.silversneakers.com  -Walk with a Doc: http://www.duncan-williams.com/  -Check out the [facility] Active Adults 50+ section on the [place] of [employer] (hiking clubs, book clubs, cards and games, chess, exercise classes, aquatic classes and much more) - see the website for details: https://www.[place].gov/departments/parks-recreation/active-adults50  -YouTube has lots of exercise videos for different ages and abilities as well  -[facility] (a variety of indoor and outdoor inperson activities for adults). ([phone])233-9014. [address].  -Virtual Online Classes (a variety of topics): see seniorplanet.org or call 2027121192  -consider volunteering  at a school, hospice center, church, senior center or elsewhere           Terressa Koyanagi, DO

## 2023-06-06 ENCOUNTER — Ambulatory Visit: Payer: Medicare HMO | Admitting: Gastroenterology

## 2023-06-06 NOTE — Progress Notes (Deleted)
HPI :  Last seen 2019:   EGD 01/06/2018: - Esophagogastric landmarks were identified: the Z-line was found at 36 cm, the gastroesophageal junction was found at 36 cm and the upper extent of the gastric folds was found at 36 cm from the incisors. Findings: - LA Grade B esophagitis was found 36 cm from the incisors. - Mucosal changes characterized by nodularity were found at the gastroesophageal junction. I suspect this represents inflammatory change from reflux but biopsies were taken with a cold forceps for histology. - The exam of the esophagus was otherwise normal. - Few non-bleeding superficial clean based gastric ulcers with no stigmata of bleeding were found in the gastric antrum. The largest lesion was 3 mm in largest dimension. Biopsies were taken with a cold forceps from the antrum, body, and incisura for Helicobacter pylori testing. - Patchy mildly erythematous mucosa was found in the gastric body and in the gastric antrum. - The exam of the stomach was otherwise normal. - The duodenal bulb and second portion of the duodenum were normal.  Increased omeprazole to BID dosing    Colonoscopy 01/06/2018: - One 7 mm polyp in the cecum, removed with a cold snare. Resected and retrieved. - A single colonic angiodysplastic lesion. - One 6 mm polyp in the ascending colon, removed with a cold snare. Resected and retrieved. - One 6 mm polyp at the hepatic flexure, removed with a cold snare. Resected and retrieved. - One 3 mm polyp at the hepatic flexure, removed with a cold biopsy forceps. Resected and retrieved. - Two 4 to 5 mm polyps in the transverse colon, removed with a cold snare. Resected and retrieved. - Three 3 to 4 mm polyps in the sigmoid colon, removed with a cold snare. Resected and retrieved. - Diverticulosis in the sigmoid colon. - Internal hemorrhoids. - The examination was otherwise normal.  Diagnosis 1. Surgical [P], gastric antrum and gastric body - MILD CHRONIC GASTRITIS WITH  ACTIVITY - H. PYLORI ORGANISMS PRESENT - NO INTESTINAL METAPLASIA IDENTIFIED - SEE COMMENT 2. Surgical [P], GE junction - GLANDULAR EPITHELIUM WITH ACUTE AND CHRONIC INFLAMMATION - INTESTINAL METAPLASIA PRESENT - NO SQUAMOUS EPITHELIUM, DYSPLASIA OR MALIGNANCY IDENTIFIED - SEE COMMENT 3. Surgical [P], sigmoid, transverse, hepatic flexure, ascending and cecum, polyp (10) - MULTIPLE FRAGMENTS OF TUBULAR ADENOMA(S) - SESSILE SERRATED POLYP (1 FRAGMENT) - NO HIGH GRADE DYSPLASIA OR MALIGNANCY IDENTIFIED  Raynelle Fanning can you please relay the following: - 9 polyps removed, all adenomas / sessile serrated, due for recall colonoscopy in 3 years - EGD showed esophagitis and gastric ulcers. She tested positive for H pylori. 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   - I am recommending a repeat EGD in 3 months or so to assess for healing of gastric ulcers, test for H pylori eradication, and take additional biopsies of GEJ while on PPI     Celia: - recall colonoscopy 3 years - recall EGD 3 months          Past Medical History:  Diagnosis Date   AAA (abdominal aortic aneurysm) (HCC)    Arthritis    Blood transfusion without reported diagnosis    COPD (chronic obstructive pulmonary disease) (HCC)    Dysmenorrhea  Dyspnea 01/28/2017   just recently started wheezing   Emphysema of lung (HCC)    GERD (gastroesophageal reflux disease)    Hyperlipidemia    Hypothyroidism    Thyroid disease    Tobacco abuse    Vertigo      Past Surgical History:  Procedure Laterality Date   ABDOMINAL AORTIC ANEURYSM REPAIR N/A 02/05/2017   Procedure: ANEURYSM ABDOMINAL AORTIC REPAIR;  Surgeon: Sherren Kerns, MD;  Location: [facility]  OR;  Service: Vascular;  Laterality: N/A;   CESAREAN SECTION     x 3   INSERTION OF MESH N/A 09/13/2020   Procedure: INSERTION OF MESH;  Surgeon: Axel Filler, MD;  Location: [facility] OR;  Service: General;  Laterality: N/A;   JOINT REPLACEMENT     ROBOTIC INCISIONAL HERNIA REPAIR WITH MESH RETRORECTUS REPAIR (N/  09/13/2020   SHOULDER SURGERY  02/2015   TOTAL KNEE ARTHROPLASTY Right 01/23/2016   Procedure: TOTAL KNEE ARTHROPLASTY;  Surgeon: Valeria Batman, MD;  Location: [facility] OR;  Service: Orthopedics;  Laterality: Right;   TUBAL LIGATION     XI ROBOTIC ASSISTED VENTRAL HERNIA N/A 09/13/2020   Procedure: ROBOTIC INCISIONAL HERNIA REPAIR WITH MESH;  Surgeon: Axel Filler, MD;  Location: [facility] OR;  Service: General;  Laterality: N/A;   Family History  Problem Relation Age of Onset   Thyroid disease Mother    Thyroid disease Brother    Thyroid disease Other    Diabetes Other    Hyperlipidemia Other    Hypertension Other    Colon cancer Neg Hx    Colon polyps Neg Hx    Esophageal cancer Neg Hx    Rectal cancer Neg Hx    Stomach cancer Neg Hx    Social History   Tobacco Use   Smoking status: Former    Current packs/day: 0.00    Average packs/day: 1 pack/day for 50.0 years (50.0 ttl pk-yrs)    Types: Cigarettes    Start date: 01/01/1967    Quit date: 12/31/2016    Years since quitting: 6.4   Smokeless tobacco: Never  Vaping Use   Vaping status: Never Used  Substance Use Topics   Alcohol use: No    Alcohol/week: 0.0 standard drinks of alcohol   Drug use: No   Current Outpatient Medications  Medication Sig Dispense Refill   albuterol (VENTOLIN HFA) 108 (90 Base) MCG/ACT inhaler Inhale 2 puffs into the lungs every 6 (six) hours as needed for wheezing or shortness of breath. 1 each 5   aspirin 325 MG EC tablet Take 325 mg by mouth daily.     atorvastatin (LIPITOR) 40 MG tablet TAKE 1 TABLET EVERY DAY 90 tablet 3   diazepam (VALIUM) 5 MG tablet TAKE 1/2 TO 1 TABLET BY MOUTH TWICE A  DAY AS NEEDED 30 tablet 2   doxycycline (VIBRAMYCIN) 100 MG capsule Take 1 capsule (100 mg total) by mouth 2 (two) times daily. 14 capsule 0   HYDROcodone-acetaminophen (NORCO) 7.5-325 MG tablet Take 1 tablet by mouth 3 (three) times daily as needed.     ibuprofen (ADVIL) 200 MG tablet Take 400 mg by mouth every 6 (six) hours as needed for moderate pain or headache.     levothyroxine (SYNTHROID) 75 MCG tablet TAKE 1 TABLET EVERY DAY 30 tablet 11   predniSONE (DELTASONE) 50 MG tablet Take 1 tablet (50 mg total) by mouth daily with breakfast. 5 tablet 0   Tiotropium Bromide-Olodaterol (STIOLTO RESPIMAT) 2.5-2.5 MCG/ACT AERS Inhale 2 puffs into the  lungs daily. 1 each 5   No current facility-administered medications for this visit.   No Known Allergies   Review of Systems: All systems reviewed and negative except where noted in HPI.    No results found.  Physical Exam: There were no vitals taken for this visit. Constitutional: Pleasant,well-developed, ***female in no acute distress. HEENT: Normocephalic and atraumatic. Conjunctivae are normal. No scleral icterus. Neck supple.  Cardiovascular: Normal rate, regular rhythm.  Pulmonary/chest: Effort normal and breath sounds normal. No wheezing, rales or rhonchi. Abdominal: Soft, nondistended, nontender. Bowel sounds active throughout. There are no masses palpable. No hepatomegaly. Extremities: no edema Lymphadenopathy: No cervical adenopathy noted. Neurological: Alert and oriented to person place and time. Skin: Skin is warm and dry. No rashes noted. Psychiatric: Normal mood and affect. Behavior is normal.   ASSESSMENT: 72 y.o. female here for assessment of the following  No diagnosis found.  PLAN:   Shirline Frees, NP

## 2023-09-16 ENCOUNTER — Other Ambulatory Visit: Payer: Self-pay | Admitting: Adult Health

## 2023-09-16 DIAGNOSIS — H8110 Benign paroxysmal vertigo, unspecified ear: Secondary | ICD-10-CM

## 2023-09-17 NOTE — Telephone Encounter (Signed)
 Okay for refill?

## 2023-09-25 LAB — HM MAMMOGRAPHY

## 2023-09-26 ENCOUNTER — Encounter: Payer: Self-pay | Admitting: Adult Health

## 2023-10-30 ENCOUNTER — Ambulatory Visit: Payer: Self-pay | Admitting: Adult Health

## 2023-10-30 ENCOUNTER — Ambulatory Visit (INDEPENDENT_AMBULATORY_CARE_PROVIDER_SITE_OTHER): Payer: Medicare HMO | Admitting: Internal Medicine

## 2023-10-30 ENCOUNTER — Encounter: Payer: Self-pay | Admitting: Internal Medicine

## 2023-10-30 VITALS — BP 120/80 | HR 80 | Temp 98.1°F | Ht 64.0 in | Wt 157.0 lb

## 2023-10-30 DIAGNOSIS — I2510 Atherosclerotic heart disease of native coronary artery without angina pectoris: Secondary | ICD-10-CM | POA: Insufficient documentation

## 2023-10-30 DIAGNOSIS — K219 Gastro-esophageal reflux disease without esophagitis: Secondary | ICD-10-CM | POA: Diagnosis not present

## 2023-10-30 DIAGNOSIS — R079 Chest pain, unspecified: Secondary | ICD-10-CM

## 2023-10-30 DIAGNOSIS — E782 Mixed hyperlipidemia: Secondary | ICD-10-CM | POA: Diagnosis not present

## 2023-10-30 MED ORDER — PANTOPRAZOLE SODIUM 40 MG PO TBEC: 40.0000 mg | DELAYED_RELEASE_TABLET | Freq: Every day | ORAL | 11 refills | Status: DC

## 2023-10-30 NOTE — Assessment & Plan Note (Signed)
Suspect uncontrollled, for protonix 40 qd

## 2023-10-30 NOTE — Assessment & Plan Note (Signed)
Also for Card CT score

## 2023-10-30 NOTE — Assessment & Plan Note (Signed)
Etiology unclear, ECG reviewed, for PPI trial , and Card CT score, declines repeat CXR or stress testing for now

## 2023-10-30 NOTE — Patient Instructions (Signed)
Please take all new medication as prescribed - the protonix 40 mg per day for at least 1 month  Please continue all other medications as before, and refills have been done if requested.  Please have the pharmacy call with any other refills you may need.  Please keep your appointments with your specialists as you may have planned  You will be contacted regarding the referral for: Cardiac CT score

## 2023-10-30 NOTE — Progress Notes (Signed)
Patient ID: Stacy Garcia, female   DOB: Jan 28, 1951, 73 y.o.   MRN: [id number]        Chief Complaint: follow up chest pain, GERD, hld, cor calcifications on CT       HPI:  Stacy Garcia is a 73 y.o. female here with c/o SSCP lower sternal without radiation, mil, off and on, started about 2 wks ago, maybe frequent since then, Pt denies increased sob or doe, orthopnea, PND, increased LE swelling, palpitations, dizziness or syncope or dipharesis.  Does have wheezing often. Has tried anatacids before but not in the last 2 wks and doesn't like the way it makes her feel.  Did not try inhaler last pm.  No fever, cough,  Denies worsening abd pain, dysphagia, bowel change or blood. Does have hx of AAA repair .  Smoked for 54 yrs but stopped x 7 yrs.         Wt Readings from Last 3 Encounters:  10/30/23 157 lb (71.2 kg)  04/10/23 153 lb (69.4 kg)  02/07/23 154 lb (69.9 kg)   BP Readings from Last 3 Encounters:  10/30/23 120/80  04/22/23 136/77  04/10/23 132/86         Past Medical History:  Diagnosis Date   AAA (abdominal aortic aneurysm) (HCC)    Arthritis    Blood transfusion without reported diagnosis    COPD (chronic obstructive pulmonary disease) (HCC)    Dysmenorrhea    Dyspnea 01/28/2017   just recently started wheezing   Emphysema of lung (HCC)    GERD (gastroesophageal reflux disease)    Hyperlipidemia    Hypothyroidism    Thyroid disease    Tobacco abuse    Vertigo    Past Surgical History:  Procedure Laterality Date   ABDOMINAL AORTIC ANEURYSM REPAIR N/A 02/05/2017   Procedure: ANEURYSM ABDOMINAL AORTIC REPAIR;  Surgeon: Sherren Kerns, MD;  Location: [facility] OR;  Service: Vascular;  Laterality: N/A;   CESAREAN SECTION     x 3   INSERTION OF MESH N/A 09/13/2020   Procedure: INSERTION OF MESH;  Surgeon: Axel Filler, MD;  Location: [facility] OR;  Service: General;  Laterality: N/A;   JOINT REPLACEMENT     ROBOTIC INCISIONAL HERNIA REPAIR WITH MESH RETRORECTUS REPAIR (N/   09/13/2020   SHOULDER SURGERY  02/2015   TOTAL KNEE ARTHROPLASTY Right 01/23/2016   Procedure: TOTAL KNEE ARTHROPLASTY;  Surgeon: Valeria Batman, MD;  Location: [facility] OR;  Service: Orthopedics;  Laterality: Right;   TUBAL LIGATION     XI ROBOTIC ASSISTED VENTRAL HERNIA N/A 09/13/2020   Procedure: ROBOTIC INCISIONAL HERNIA REPAIR WITH MESH;  Surgeon: Axel Filler, MD;  Location: [facility] OR;  Service: General;  Laterality: N/A;    reports that she quit smoking about 6 years ago. Her smoking use included cigarettes. She started smoking about 56 years ago. She has a 50 pack-year smoking history. She has never used smokeless tobacco. She reports that she does not drink alcohol and does not use drugs. family history includes Diabetes in an other family member; Hyperlipidemia in an other family member; Hypertension in an other family member; Thyroid disease in her brother, mother, and another family member. No Known Allergies Current Outpatient Medications on File Prior to Visit  Medication Sig Dispense Refill   albuterol (VENTOLIN HFA) 108 (90 Base) MCG/ACT inhaler Inhale 2 puffs into the lungs every 6 (six) hours as needed for wheezing or shortness of breath. 1 each 5   aspirin 325 MG EC  tablet Take 325 mg by mouth daily.     atorvastatin (LIPITOR) 40 MG tablet TAKE 1 TABLET EVERY DAY 90 tablet 3   diazepam (VALIUM) 5 MG tablet TAKE 1/2 TO 1 (ONE-HALF TO ONE) TABLET BY MOUTH TWICE DAILY AS NEEDED 30 tablet 2   ibuprofen (ADVIL) 200 MG tablet Take 400 mg by mouth every 6 (six) hours as needed for moderate pain or headache.     levothyroxine (SYNTHROID) 75 MCG tablet TAKE 1 TABLET EVERY DAY 30 tablet 11   predniSONE (DELTASONE) 50 MG tablet Take 1 tablet (50 mg total) by mouth daily with breakfast. 5 tablet 0   Tiotropium Bromide-Olodaterol (STIOLTO RESPIMAT) 2.5-2.5 MCG/ACT AERS Inhale 2 puffs into the lungs daily. 1 each 5   doxycycline (VIBRAMYCIN) 100 MG capsule Take 1 capsule (100 mg total) by mouth 2  (two) times daily. (Patient not taking: Reported on 10/30/2023) 14 capsule 0   HYDROcodone-acetaminophen (NORCO) 7.5-325 MG tablet Take 1 tablet by mouth 3 (three) times daily as needed. (Patient not taking: Reported on 10/30/2023)     No current facility-administered medications on file prior to visit.        ROS:  All others reviewed and negative.  Objective        PE:  BP 120/80 (BP Location: Left Arm, Patient Position: Sitting, Cuff Size: Normal)   Pulse 80   Temp 98.1 F (36.7 C) (Oral)   Ht 5\' 4"  (1.626 m)   Wt 157 lb (71.2 kg)   SpO2 96%   BMI 26.95 kg/m                 Constitutional: Pt appears in NAD               HENT: Head: NCAT.                Right Ear: External ear normal.                 Left Ear: External ear normal.                Eyes: . Pupils are equal, round, and reactive to light. Conjunctivae and EOM are normal               Nose: without d/c or deformity               Neck: Neck supple. Gross normal ROM               Cardiovascular: Normal rate and regular rhythm.                 Pulmonary/Chest: Effort normal and breath sounds without rales or wheezing.                Abd:  Soft, NT, ND, + BS, no organomegaly               Neurological: Pt is alert. At baseline orientation, motor grossly intact               Skin: Skin is warm. No rashes, no other new lesions, LE edema - none               Psychiatric: Pt behavior is normal without agitation   Micro: none  Cardiac tracings I have personally interpreted today:  ECG - NSR 68  Pertinent Radiological findings (summarize): Jul 02 2022 CXR - NAD  CT abd pelvis w/o CM - Feb 06 2022  IMPRESSION: There is no  evidence of intestinal obstruction or pneumoperitoneum. There is no hydronephrosis. Appendix is not dilated.   There are scattered coronary artery calcifications. There is ectasia of infrarenal aorta measuring 3 cm.   Lab Results  Component Value Date   WBC 12.8 (H) 04/22/2023   HGB 16.5 (H)  04/22/2023   HCT 50.3 (H) 04/22/2023   PLT 194 04/22/2023   GLUCOSE 126 (H) 04/22/2023   CHOL 157 04/10/2023   TRIG 169.0 (H) 04/10/2023   HDL 50.10 04/10/2023   LDLDIRECT 132.0 09/15/2018   LDLCALC 73 04/10/2023   ALT 14 04/10/2023   AST 16 04/10/2023   NA 137 04/22/2023   K 3.5 04/22/2023   CL 102 04/22/2023   CREATININE 0.82 04/22/2023   BUN 13 04/22/2023   CO2 25 04/22/2023   TSH 0.52 04/10/2023   INR 1.28 02/05/2017   Assessment/Plan:  NIKITIA ASBILL is a 73 y.o. White or Caucasian [1] female with  has a past medical history of AAA (abdominal aortic aneurysm) (HCC), Arthritis, Blood transfusion without reported diagnosis, COPD (chronic obstructive pulmonary disease) (HCC), Dysmenorrhea, Dyspnea (01/28/2017), Emphysema of lung (HCC), GERD (gastroesophageal reflux disease), Hyperlipidemia, Hypothyroidism, Thyroid disease, Tobacco abuse, and Vertigo.  Coronary artery calcification seen on CT scan Also for Card CT score  Mixed hyperlipidemia Lab Results  Component Value Date   LDLCALC 73 04/10/2023   Uncontrolled, pt to continue current statin lipitor 40 every day, declines other change for now   Gastroesophageal reflux disease without esophagitis Suspect uncontrollled, for protonix 40 qd  Chest pain Etiology unclear, ECG reviewed, for PPI trial , and Card CT score, declines repeat CXR or stress testing for now  Followup: Return if symptoms worsen or fail to improve.  Oliver Barre, MD 10/30/2023 8:15 PM [facility]

## 2023-10-30 NOTE — Telephone Encounter (Signed)
Chief Complaint: Chest pain Symptoms: last night 5/10, no pain at present time Frequency: Ongoing off and on x 1 week Pertinent Negatives: Patient denies other symptoms Disposition: [] ED /[] Urgent Care (no appt availability in office) / [x] Appointment(In office/virtual)/ []  [place] [facility]/ [] Home Care/ [] Refused Recommended Disposition /[] [place] [facility]/ []  Follow-up with PCP Additional Notes: Patient says 1 week ago she started having really bad pain in the chest/back, but wasn't sure if it was the back or the chest. She says it comes and goes. Last night she had the pain at a 5/10 level x 1 hour. She got out the bed and took a Valium that she takes for vertigo when she has it. She says after a while the pain went away and she slept all night. She says her husband wanted her to call. Advised availability with PCP tomorrow. She asked if she could be seen today, advised no availability at [place]. She agreed a different office, scheduled today at [facility] with Dr. Jonny Ruiz.     Reason for Disposition  [1] Chest pain lasts > 5 minutes AND [2] occurred > 3 days ago (72 hours) AND [3] NO chest pain or cardiac symptoms now  Answer Assessment - Initial Assessment Questions 1. LOCATION: "Where does it hurt?"       Underneath left side and in the middle of breast 2. RADIATION: "Does the pain go anywhere else?" (e.g., into neck, jaw, arms, back)     No 3. ONSET: "When did the chest pain begin?" (Minutes, hours or days)      1 week ago but unsure if it was chest or back at that time due to the pain was so bad 4. PATTERN: "Does the pain come and go, or has it been constant since it started?"  "Does it get worse with exertion?"      Comes and goes 5. DURATION: "How long does it last" (e.g., seconds, minutes, hours)     Maybe 1 hour last night 6. SEVERITY: "How bad is the pain?"  (e.g., Scale 1-10; mild, moderate, or severe)    - MILD (1-3): doesn't interfere with normal activities      - MODERATE (4-7): interferes with normal activities or awakens from sleep    - SEVERE (8-10): excruciating pain, unable to do any normal activities       5 last night; no pain at this time 7. CARDIAC RISK FACTORS: "Do you have any history of heart problems or risk factors for heart disease?" (e.g., angina, prior heart attack; diabetes, high blood pressure, high cholesterol, smoker, or strong family history of heart disease)     No history 8. PULMONARY RISK FACTORS: "Do you have any history of lung disease?"  (e.g., blood clots in lung, asthma, emphysema, birth control pills)     COPD 9. CAUSE: "What do you think is causing the chest pain?"     I don't know 10. OTHER SYMPTOMS: "Do you have any other symptoms?" (e.g., dizziness, nausea, vomiting, sweating, fever, difficulty breathing, cough)     No  Protocols used: Chest Pain-A-AH

## 2023-10-30 NOTE — Telephone Encounter (Signed)
Copied from CRM [id number]. Topic: Clinical - Red Word Triage >> Oct 30, 2023  9:02 AM Irine Seal wrote: [facility] that prompted transfer to Nurse Triage: patient presents with intermittent chest pain, pain is mild- moderate. Patient denies any shortness of breath. Reason for Disposition  [1] Chest pain lasts > 5 minutes AND [2] occurred > 3 days ago (72 hours) AND [3] NO chest pain or cardiac symptoms now  Answer Assessment - Initial Assessment Questions 1. LOCATION: "Where does it hurt?"       Underneath left side and in the middle of breast 2. RADIATION: "Does the pain go anywhere else?" (e.g., into neck, jaw, arms, back)     No 3. ONSET: "When did the chest pain begin?" (Minutes, hours or days)      1 week ago but unsure if it was chest or back at that time due to the pain was so bad 4. PATTERN: "Does the pain come and go, or has it been constant since it started?"  "Does it get worse with exertion?"      Comes and goes 5. DURATION: "How long does it last" (e.g., seconds, minutes, hours)     Maybe 1 hour last night 6. SEVERITY: "How bad is the pain?"  (e.g., Scale 1-10; mild, moderate, or severe)    - MILD (1-3): doesn't interfere with normal activities     - MODERATE (4-7): interferes with normal activities or awakens from sleep    - SEVERE (8-10): excruciating pain, unable to do any normal activities       5 last night; no pain at this time 7. CARDIAC RISK FACTORS: "Do you have any history of heart problems or risk factors for heart disease?" (e.g., angina, prior heart attack; diabetes, high blood pressure, high cholesterol, smoker, or strong family history of heart disease)     No history 8. PULMONARY RISK FACTORS: "Do you have any history of lung disease?"  (e.g., blood clots in lung, asthma, emphysema, birth control pills)     COPD 9. CAUSE: "What do you think is causing the chest pain?"     I don't know 10. OTHER SYMPTOMS: "Do you have any other symptoms?" (e.g., dizziness, nausea,  vomiting, sweating, fever, difficulty breathing, cough)     No  Protocols used: Chest Pain-A-AH

## 2023-10-30 NOTE — Assessment & Plan Note (Signed)
Lab Results  Component Value Date   LDLCALC 73 04/10/2023   Uncontrolled, pt to continue current statin lipitor 40 every day, declines other change for now

## 2023-10-31 NOTE — Addendum Note (Signed)
Addended by: Racheal Patches on: 10/31/2023 04:33 PM   Modules accepted: Orders

## 2023-11-11 ENCOUNTER — Telehealth: Payer: Self-pay

## 2023-11-11 ENCOUNTER — Other Ambulatory Visit (HOSPITAL_COMMUNITY): Payer: Self-pay

## 2023-11-11 NOTE — Telephone Encounter (Signed)
 Pharmacy Patient Advocate Encounter   Received notification from  [facility] Portal that prior authorization for diazePAM 5MG  tablets is required/requested.   Insurance verification completed.   The patient is insured through [employer] .   Per test claim: PA required; PA submitted to above mentioned insurance via CoverMyMeds Key/confirmation #/EOC [id number] Status is pending

## 2023-11-11 NOTE — Telephone Encounter (Signed)
 Pharmacy Patient Advocate Encounter  Received notification from [facility] that Prior Authorization for diazePAM 5MG  tablets has been APPROVED from 09/10/23 to 09/08/24. Ran test claim, Copay is $1.20. This test claim was processed through [facility]- copay amounts may vary at other pharmacies due to pharmacy/plan contracts, or as the patient moves through the different stages of their insurance plan.   PA #/Case ID/Reference #: [id number]

## 2023-11-12 ENCOUNTER — Other Ambulatory Visit (HOSPITAL_COMMUNITY): Payer: Self-pay

## 2023-12-15 ENCOUNTER — Other Ambulatory Visit (HOSPITAL_BASED_OUTPATIENT_CLINIC_OR_DEPARTMENT_OTHER): Payer: Medicare HMO

## 2023-12-18 ENCOUNTER — Other Ambulatory Visit: Payer: Medicare HMO

## 2024-01-31 ENCOUNTER — Other Ambulatory Visit: Payer: Self-pay | Admitting: Adult Health

## 2024-02-16 ENCOUNTER — Other Ambulatory Visit: Payer: Self-pay | Admitting: Adult Health

## 2024-02-16 DIAGNOSIS — H8110 Benign paroxysmal vertigo, unspecified ear: Secondary | ICD-10-CM

## 2024-02-18 NOTE — Telephone Encounter (Signed)
 Okay for refill?

## 2024-03-24 ENCOUNTER — Other Ambulatory Visit: Payer: Self-pay | Admitting: Adult Health

## 2024-03-25 NOTE — Telephone Encounter (Signed)
Patient need to schedule a CPE for more refills. 

## 2024-04-23 ENCOUNTER — Other Ambulatory Visit: Payer: Self-pay | Admitting: Adult Health

## 2024-04-30 ENCOUNTER — Other Ambulatory Visit: Payer: Self-pay | Admitting: Adult Health

## 2024-04-30 NOTE — Telephone Encounter (Unsigned)
 Copied from CRM #[id number]. Topic: Clinical - Medication Refill >> Apr 30, 2024  3:24 PM Chiquita SQUIBB wrote: Medication: levothyroxine  (SYNTHROID ) 75 MCG tablet [[id number]]  Has the patient contacted their pharmacy? Yes- The pharmacy stated they have not heard back from the doctor  (Agent: If no, request that the patient contact the pharmacy for the refill. If patient does not wish to contact the pharmacy document the reason why and proceed with request.) (Agent: If yes, when and what did the pharmacy advise?)  This is the patient's preferred pharmacy:    [facility] - [place], [place] - [address] [place] [address] Phone: [phone] Fax: [phone]   Is this the correct pharmacy for this prescription? Yes If no, delete pharmacy and type the correct one.   Has the prescription been filled recently? No  Is the patient out of the medication? No  Has the patient been seen for an appointment in the last year OR does the patient have an upcoming appointment? No  Can we respond through MyChart? Yes  Agent: Please be advised that Rx refills may take up to 3 business days. We ask that you follow-up with your pharmacy.

## 2024-05-05 ENCOUNTER — Encounter: Payer: Self-pay | Admitting: Adult Health

## 2024-05-05 ENCOUNTER — Other Ambulatory Visit: Payer: Self-pay | Admitting: Adult Health

## 2024-05-05 ENCOUNTER — Ambulatory Visit (INDEPENDENT_AMBULATORY_CARE_PROVIDER_SITE_OTHER): Admitting: Adult Health

## 2024-05-05 ENCOUNTER — Ambulatory Visit: Payer: Self-pay | Admitting: Adult Health

## 2024-05-05 VITALS — BP 110/76 | HR 71 | Temp 97.0°F | Ht 64.0 in | Wt 159.0 lb

## 2024-05-05 DIAGNOSIS — Z0001 Encounter for general adult medical examination with abnormal findings: Secondary | ICD-10-CM

## 2024-05-05 DIAGNOSIS — H8110 Benign paroxysmal vertigo, unspecified ear: Secondary | ICD-10-CM

## 2024-05-05 DIAGNOSIS — E018 Other iodine-deficiency related thyroid disorders and allied conditions: Secondary | ICD-10-CM

## 2024-05-05 DIAGNOSIS — I714 Abdominal aortic aneurysm, without rupture, unspecified: Secondary | ICD-10-CM

## 2024-05-05 DIAGNOSIS — Z0000 Encounter for general adult medical examination without abnormal findings: Secondary | ICD-10-CM

## 2024-05-05 DIAGNOSIS — J439 Emphysema, unspecified: Secondary | ICD-10-CM | POA: Diagnosis not present

## 2024-05-05 DIAGNOSIS — E876 Hypokalemia: Secondary | ICD-10-CM

## 2024-05-05 DIAGNOSIS — E559 Vitamin D deficiency, unspecified: Secondary | ICD-10-CM | POA: Diagnosis not present

## 2024-05-05 DIAGNOSIS — G8929 Other chronic pain: Secondary | ICD-10-CM

## 2024-05-05 DIAGNOSIS — E782 Mixed hyperlipidemia: Secondary | ICD-10-CM

## 2024-05-05 DIAGNOSIS — M545 Low back pain, unspecified: Secondary | ICD-10-CM

## 2024-05-05 LAB — CBC WITH DIFFERENTIAL/PLATELET
Basophils Absolute: 0 K/uL (ref 0.0–0.1)
Basophils Relative: 0.4 % (ref 0.0–3.0)
Eosinophils Absolute: 0.1 K/uL (ref 0.0–0.7)
Eosinophils Relative: 1.4 % (ref 0.0–5.0)
HCT: 48.9 % — ABNORMAL HIGH (ref 36.0–46.0)
Hemoglobin: 16.2 g/dL — ABNORMAL HIGH (ref 12.0–15.0)
Lymphocytes Relative: 31.5 % (ref 12.0–46.0)
Lymphs Abs: 2.1 K/uL (ref 0.7–4.0)
MCHC: 33.1 g/dL (ref 30.0–36.0)
MCV: 94 fl (ref 78.0–100.0)
Monocytes Absolute: 0.4 K/uL (ref 0.1–1.0)
Monocytes Relative: 6.1 % (ref 3.0–12.0)
Neutro Abs: 4 K/uL (ref 1.4–7.7)
Neutrophils Relative %: 60.6 % (ref 43.0–77.0)
Platelets: 208 K/uL (ref 150.0–400.0)
RBC: 5.2 Mil/uL — ABNORMAL HIGH (ref 3.87–5.11)
RDW: 14.3 % (ref 11.5–15.5)
WBC: 6.6 K/uL (ref 4.0–10.5)

## 2024-05-05 LAB — COMPREHENSIVE METABOLIC PANEL WITH GFR
ALT: 14 U/L (ref 0–35)
AST: 14 U/L (ref 0–37)
Albumin: 4.5 g/dL (ref 3.5–5.2)
Alkaline Phosphatase: 80 U/L (ref 39–117)
BUN: 13 mg/dL (ref 6–23)
CO2: 32 meq/L (ref 19–32)
Calcium: 9.9 mg/dL (ref 8.4–10.5)
Chloride: 101 meq/L (ref 96–112)
Creatinine, Ser: 0.77 mg/dL (ref 0.40–1.20)
GFR: 75.38 mL/min (ref >60.00)
Glucose, Bld: 110 mg/dL — ABNORMAL HIGH (ref 70–99)
Potassium: 5.3 meq/L — ABNORMAL HIGH (ref 3.5–5.1)
Sodium: 144 meq/L (ref 135–145)
Total Bilirubin: 1 mg/dL (ref 0.2–1.2)
Total Protein: 7.2 g/dL (ref 6.0–8.3)

## 2024-05-05 LAB — LIPID PANEL
Cholesterol: 180 mg/dL (ref 0–200)
HDL: 46.8 mg/dL (ref >39.00)
LDL Cholesterol: 86 mg/dL (ref 0–99)
NonHDL: 132.79
Total CHOL/HDL Ratio: 4
Triglycerides: 234 mg/dL — ABNORMAL HIGH (ref 0.0–149.0)
VLDL: 46.8 mg/dL — ABNORMAL HIGH (ref 0.0–40.0)

## 2024-05-05 LAB — TSH: TSH: 1.75 u[IU]/mL (ref 0.35–5.50)

## 2024-05-05 LAB — VITAMIN D 25 HYDROXY (VIT D DEFICIENCY, FRACTURES): VITD: 18.43 ng/mL — ABNORMAL LOW (ref 30.00–100.00)

## 2024-05-05 MED ORDER — MECLIZINE HCL 25 MG PO TABS
25.0000 mg | ORAL_TABLET | Freq: Three times a day (TID) | ORAL | 0 refills | Status: DC | PRN
Start: 2024-05-05 — End: 2024-05-05

## 2024-05-05 MED ORDER — LEVOTHYROXINE SODIUM 75 MCG PO TABS
75.0000 ug | ORAL_TABLET | Freq: Every day | ORAL | 3 refills | Status: AC
Start: 2024-05-05 — End: ?

## 2024-05-05 MED ORDER — MECLIZINE HCL 25 MG PO TABS: 25.0000 mg | ORAL_TABLET | Freq: Three times a day (TID) | ORAL | 0 refills | Status: AC | PRN

## 2024-05-05 NOTE — Patient Instructions (Signed)
 It was great seeing you today   We will follow up with you regarding your lab work   Please let me know if you need anything

## 2024-05-05 NOTE — Progress Notes (Addendum)
 Subjective:    Patient ID: Stacy Garcia, female    DOB: 1951/09/03, 73 y.o.   MRN: [id number]  HPI Patient presents for yearly preventative medicine examination. She is a pleasant 73 year old female who  has a past medical history of AAA (abdominal aortic aneurysm) (HCC), Arthritis, Blood transfusion without reported diagnosis, COPD (chronic obstructive pulmonary disease) (HCC), Dysmenorrhea, Dyspnea (01/28/2017), Emphysema of lung (HCC), GERD (gastroesophageal reflux disease), Hyperlipidemia, Hypothyroidism, Thyroid  disease, Tobacco abuse, and Vertigo.  Hypothyroidism - takes synthroid  75 mcg daily. She feels controlled on this dose  Lab Results  Component Value Date   TSH 0.52 04/10/2023   Hyperlipidemia - managed with lipitor 40 mg and ASA 81 mg daily. She denies myalgia or fatigue  Lab Results  Component Value Date   CHOL 157 04/10/2023   HDL 50.10 04/10/2023   LDLCALC 73 04/10/2023   LDLDIRECT 132.0 09/15/2018   TRIG 169.0 (H) 04/10/2023   CHOLHDL 3 04/10/2023   COPD - is followed by pulmonary on a routine basis. She is not currently using any inhalers and feels well controlled without them   H/o Vertigo- takes Valium  2.5 mg to 5 mg PRN. She reports that for the last several weeks her vertigo has been really bad. Rolling over in bed, bending, twisting or change in position can cause dizziness that lasts a few moments. She takes Valium  at night since it makes her sleepy and this seems to help until she wakes up.   AAA- has been stable at 3 cm - s/p repair. Is seen by Vascular surgery routinely   Vitamin D  Deficiency - was on Vitamin D  50,000 units weekly but stopped taking this. This was prescribed by pain management  Last vitamin D  Last vitamin D  Lab Results  Component Value Date   VD25OH 19.81 (L) 04/10/2023   Chronic low back pain - managed by pain management - Taking Norco 7.5 mg    All immunizations and health maintenance protocols were reviewed with the patient and  needed orders were placed.  Appropriate screening laboratory values were ordered for the patient including screening of hyperlipidemia, renal function and hepatic function.   Medication reconciliation,  past medical history, social history, problem list and allergies were reviewed in detail with the patient  Goals were established with regard to weight loss, exercise, and  diet in compliance with medications Wt Readings from Last 3 Encounters:  05/05/24 159 lb (72.1 kg)  10/30/23 157 lb (71.2 kg)  04/10/23 153 lb (69.4 kg)   She refused her colonoscopy, dexa scan and lung cancer screening   Review of Systems  Constitutional: Negative.   HENT: Negative.    Eyes: Negative.   Respiratory: Negative.    Cardiovascular: Negative.   Gastrointestinal: Negative.   Endocrine: Negative.   Genitourinary: Negative.   Musculoskeletal: Negative.   Skin: Negative.   Allergic/Immunologic: Negative.   Neurological:  Positive for dizziness.  Hematological: Negative.   Psychiatric/Behavioral: Negative.     Past Medical History:  Diagnosis Date   AAA (abdominal aortic aneurysm) (HCC)    Arthritis    Blood transfusion without reported diagnosis    COPD (chronic obstructive pulmonary disease) (HCC)    Dysmenorrhea    Dyspnea 01/28/2017   just recently started wheezing   Emphysema of lung (HCC)    GERD (gastroesophageal reflux disease)    Hyperlipidemia    Hypothyroidism    Thyroid  disease    Tobacco abuse    Vertigo     Social  History   Socioeconomic History   Marital status: Married    Spouse name: Not on file   Number of children: 3   Years of education: Not on file   Highest education level: Not on file  Occupational History   Occupation: Designer/manufacturing    Comment: Runner, broadcasting/film/video; retired 2017   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  Tobacco Use   Smoking status: Former    Current packs/day: 0.00    Average packs/day: 1 pack/day for 50.0 years (50.0  ttl pk-yrs)    Types: Cigarettes    Start date: 01/01/1967    Quit date: 12/31/2016    Years since quitting: 7.3   Smokeless tobacco: Never  Vaping Use   Vaping status: Never Used  Substance and Sexual Activity   Alcohol  use: No    Alcohol /week: 0.0 standard drinks of alcohol    Drug use: No   Sexual activity: Yes    Birth control/protection: Surgical  Other Topics Concern   Not on file  Social History Narrative   11/03/2018:   Lives with husband in 2 story home   Has 3 children, 7 grandchildren, 9 great-grandchildren, all of whom live close by   Kendrick care of two of her great grandchildren (50 yo and 5yo)   Engineer, manufacturing working, 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   Social Drivers of Corporate investment banker Strain: Low Risk  (04/02/2022)   Overall Financial Resource Strain (CARDIA)    Difficulty of Paying Living Expenses: Not hard at all  Food Insecurity: No Food Insecurity (04/02/2022)   Hunger Vital Sign    Worried About Running Out of Food in the Last Year: Never true    Ran Out of Food in the Last Year: Never true  Transportation Needs: No Transportation Needs (04/02/2022)   PRAPARE - Administrator, Civil Service (Medical): No    Lack of Transportation (Non-Medical): No  Physical Activity: Inactive (04/02/2022)   Exercise Vital Sign    Days of Exercise per Week: 0 days    Minutes of Exercise per Session: 0 min  Stress: No Stress Concern Present (04/02/2022)   [employer] of Occupational Health - Occupational Stress Questionnaire    Feeling of Stress : Not at all  Social Connections: Socially Integrated (03/20/2021)   Social Connection and Isolation Panel    Frequency of Communication with Friends and Family: Once a week    Frequency of Social Gatherings with Friends and Family: More than three times a week    Attends Religious Services: More than 4 times per year    Active Member of Clubs or Organizations: Yes     Attends Banker Meetings: 1 to 4 times per year    Marital Status: Married  Catering manager Violence: Not At Risk (03/20/2021)   Humiliation, Afraid, Rape, and Kick questionnaire    Fear of Current or Ex-Partner: No    Emotionally Abused: No    Physically Abused: No    Sexually Abused: No    Past Surgical History:  Procedure Laterality Date   ABDOMINAL AORTIC ANEURYSM REPAIR N/A 02/05/2017   Procedure: ANEURYSM ABDOMINAL AORTIC REPAIR;  Surgeon: Harvey Carlin BRAVO, MD;  Location: [facility] OR;  Service: Vascular;  Laterality: N/A;   CESAREAN SECTION     x 3   INSERTION OF MESH N/A 09/13/2020   Procedure: INSERTION OF MESH;  Surgeon: Rubin Calamity, MD;  Location: [facility] OR;  Service:  General;  Laterality: N/A;   JOINT REPLACEMENT     ROBOTIC INCISIONAL HERNIA REPAIR WITH MESH RETRORECTUS REPAIR (N/  09/13/2020   SHOULDER SURGERY  02/2015   TOTAL KNEE ARTHROPLASTY Right 01/23/2016   Procedure: TOTAL KNEE ARTHROPLASTY;  Surgeon: Maude LELON Right, MD;  Location: [facility] OR;  Service: Orthopedics;  Laterality: Right;   TUBAL LIGATION     XI ROBOTIC ASSISTED VENTRAL HERNIA N/A 09/13/2020   Procedure: ROBOTIC INCISIONAL HERNIA REPAIR WITH MESH;  Surgeon: Rubin Calamity, MD;  Location: [facility] OR;  Service: General;  Laterality: N/A;    Family History  Problem Relation Age of Onset   Thyroid  disease Mother    Thyroid  disease Brother    Thyroid  disease Other    Diabetes Other    Hyperlipidemia Other    Hypertension Other    Colon cancer Neg Hx    Colon polyps Neg Hx    Esophageal cancer Neg Hx    Rectal cancer Neg Hx    Stomach cancer Neg Hx     No Known Allergies  Current Outpatient Medications on File Prior to Visit  Medication Sig Dispense Refill   albuterol  (VENTOLIN  HFA) 108 (90 Base) MCG/ACT inhaler Inhale 2 puffs into the lungs every 6 (six) hours as needed for wheezing or shortness of breath. 1 each 5   aspirin 325 MG EC tablet Take 325 mg by mouth daily.     atorvastatin   (LIPITOR) 40 MG tablet TAKE 1 TABLET EVERY DAY 30 tablet 11   diazepam  (VALIUM ) 5 MG tablet TAKE 1/2 TO 1 (ONE-HALF TO ONE) TABLET BY MOUTH TWICE DAILY AS NEEDED 30 tablet 2   HYDROcodone -acetaminophen  (NORCO) 7.5-325 MG tablet Take 1 tablet by mouth 3 (three) times daily as needed.     ibuprofen  (ADVIL ) 200 MG tablet Take 400 mg by mouth every 6 (six) hours as needed for moderate pain or headache.     levothyroxine  (SYNTHROID ) 75 MCG tablet TAKE 1 TABLET EVERY DAY 90 tablet 0   doxycycline  (VIBRAMYCIN ) 100 MG capsule Take 1 capsule (100 mg total) by mouth 2 (two) times daily. (Patient not taking: Reported on 10/30/2023) 14 capsule 0   pantoprazole  (PROTONIX ) 40 MG tablet Take 1 tablet (40 mg total) by mouth daily. 30 tablet 11   predniSONE  (DELTASONE ) 50 MG tablet Take 1 tablet (50 mg total) by mouth daily with breakfast. 5 tablet 0   Tiotropium Bromide-Olodaterol (STIOLTO RESPIMAT ) 2.5-2.5 MCG/ACT AERS Inhale 2 puffs into the lungs daily. 1 each 5   No current facility-administered medications on file prior to visit.    BP 110/76   Pulse 71   Temp (!) 97 F (36.1 C) (Tympanic)   Ht 5' 4 (1.626 m)   Wt 159 lb (72.1 kg)   SpO2 97%   BMI 27.29 kg/m       Objective:   Physical Exam Vitals and nursing note reviewed.  Constitutional:      General: She is not in acute distress.    Appearance: Normal appearance. She is not ill-appearing.  HENT:     Head: Normocephalic and atraumatic.     Right Ear: Tympanic membrane, ear canal and external ear normal. There is no impacted cerumen.     Left Ear: Tympanic membrane, ear canal and external ear normal. There is no impacted cerumen.     Nose: Nose normal. No congestion or rhinorrhea.     Mouth/Throat:     Mouth: Mucous membranes are moist.     Pharynx: Oropharynx  is clear.  Eyes:     Extraocular Movements: Extraocular movements intact.     Right eye: Nystagmus (horizontal) present.     Left eye: Nystagmus (horizontal) present.      Conjunctiva/sclera: Conjunctivae normal.     Pupils: Pupils are equal, round, and reactive to light.     Comments: Symptomatic with positional changes   Neck:     Vascular: No carotid bruit.  Cardiovascular:     Rate and Rhythm: Normal rate and regular rhythm.     Pulses: Normal pulses.     Heart sounds: No murmur heard.    No friction rub. No gallop.  Pulmonary:     Effort: Pulmonary effort is normal.     Breath sounds: Normal breath sounds.  Abdominal:     General: Abdomen is flat. Bowel sounds are normal. There is no distension.     Palpations: Abdomen is soft. There is no mass.     Tenderness: There is no abdominal tenderness. There is no guarding or rebound.     Hernia: No hernia is present.  Musculoskeletal:        General: Normal range of motion.     Cervical back: Normal range of motion and neck supple.  Lymphadenopathy:     Cervical: No cervical adenopathy.  Skin:    General: Skin is warm and dry.     Capillary Refill: Capillary refill takes less than 2 seconds.  Neurological:     General: No focal deficit present.     Mental Status: She is alert and oriented to person, place, and time.     Cranial Nerves: Cranial nerves 2-12 are intact.     Sensory: Sensation is intact.     Motor: Motor function is intact.     Coordination: Coordination is intact.     Gait: Gait is intact.  Psychiatric:        Mood and Affect: Mood normal.        Behavior: Behavior normal.        Thought Content: Thought content normal.        Judgment: Judgment normal.       Assessment & Plan:  1. Routine general medical examination at a health care facility (Primary) Today patient counseled on age appropriate routine health concerns for screening and prevention, each reviewed and up to date or declined. Immunizations reviewed and up to date or declined. Labs ordered and reviewed. Risk factors for depression reviewed and negative. Hearing function and visual acuity are intact. ADLs screened and  addressed as needed. Functional ability and level of safety reviewed and appropriate. Education, counseling and referrals performed based on assessed risks today. Patient provided with a copy of personalized plan for preventive services. - Follow up in one year or sooner if needed - stay active and eat healthy   2. HYPOTHYROIDISM, POST-RADIOACTIVE IODINE - Consider dose change of synthroid   - CBC with Differential/Platelet; Future - Comprehensive metabolic panel with GFR; Future - Lipid panel; Future - TSH; Future  3. Mixed hyperlipidemia - Consider dose change of statin  - CBC with Differential/Platelet; Future - Comprehensive metabolic panel with GFR; Future - Lipid panel; Future - TSH; Future  4. Pulmonary emphysema, unspecified emphysema type (HCC) - Controlled.  - CBC with Differential/Platelet; Future - Comprehensive metabolic panel with GFR; Future - Lipid panel; Future - TSH; Future  5. Benign paroxysmal positional vertigo, unspecified laterality - We discussed PT and MRI but she would like to hold off on this. She  would like to try Meclizine  first.  - CBC with Differential/Platelet; Future - Comprehensive metabolic panel with GFR; Future - Lipid panel; Future - TSH; Future - meclizine  (ANTIVERT ) 25 MG tablet; Take 1 tablet (25 mg total) by mouth 3 (three) times daily as needed.  Dispense: 30 tablet; Refill: 0  6. Abdominal aortic aneurysm (AAA) without rupture, unspecified part (HCC) - Follow up with vascular surgery as directed - CBC with Differential/Platelet; Future - Comprehensive metabolic panel with GFR; Future - Lipid panel; Future - TSH; Future  7. 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 Vitamin D  deficiency  - VITAMIN D  25 Hydroxy (Vit-D Deficiency, Fractures); Future  Darleene Shape, NP

## 2024-05-06 ENCOUNTER — Other Ambulatory Visit: Payer: Self-pay | Admitting: Adult Health

## 2024-05-06 ENCOUNTER — Telehealth: Payer: Self-pay | Admitting: *Deleted

## 2024-05-06 NOTE — Telephone Encounter (Signed)
 Noted

## 2024-05-06 NOTE — Telephone Encounter (Signed)
 Copied from CRM [id number]. Topic: General - Call Back - No Documentation >> May 05, 2024  5:33 PM Stacy Garcia wrote: Reason for CRM: Patient is calling back regarding lab work, relayed lab work, patient understood and scheduled the next lab work for next week.

## 2024-05-12 ENCOUNTER — Other Ambulatory Visit

## 2024-05-13 ENCOUNTER — Other Ambulatory Visit

## 2024-06-13 ENCOUNTER — Other Ambulatory Visit (HOSPITAL_BASED_OUTPATIENT_CLINIC_OR_DEPARTMENT_OTHER): Payer: Self-pay | Admitting: Adult Medicine

## 2024-06-13 DIAGNOSIS — Z122 Encounter for screening for malignant neoplasm of respiratory organs: Secondary | ICD-10-CM

## 2024-06-15 ENCOUNTER — Encounter (INDEPENDENT_AMBULATORY_CARE_PROVIDER_SITE_OTHER): Admitting: Family Medicine

## 2024-06-15 NOTE — Progress Notes (Signed)
 error

## 2024-06-29 ENCOUNTER — Ambulatory Visit: Payer: Self-pay | Admitting: Primary Care

## 2024-06-29 NOTE — Telephone Encounter (Signed)
 FYI Only or Action Required?: FYI only for provider.  Patient is followed in Pulmonology for COPD, last seen on 07/02/2022 by Shellia Oh, MD.  Called Nurse Triage reporting Cough.  Symptoms began several months ago.  Interventions attempted: Nothing.  Symptoms are: gradually worsening.  Triage Disposition: See Physician Within 24 Hours  Patient/caregiver understands and will follow disposition?: Yes  Copied from CRM #[id number]. Topic: Clinical - Red Word Triage >> Jun 29, 2024  3:55 PM Rozanna MATSU wrote: Red Word that prompted transfer to Nurse Triage: coughing and vomiting with cough, very tired Reason for Disposition  SEVERE coughing spells (e.g., whooping sound after coughing, vomiting after coughing)  Answer Assessment - Initial Assessment Questions 1. ONSET: When did the cough begin?      A couple of months ago 2. SEVERITY: How bad is the cough today?      Good day today, but yesterday was bad Reports that she coughs so much that she throws up and tires easily 3. SPUTUM: Describe the color of your sputum (e.g., none, dry cough; clear, white, yellow, green)     Denies any sputum 4. HEMOPTYSIS: Are you coughing up any blood? If Yes, ask: How much? (e.g., flecks, streaks, tablespoons, etc.)     denies 5. DIFFICULTY BREATHING: Are you having difficulty breathing? If Yes, ask: How bad is it? (e.g., mild, moderate, severe)      denies 6. FEVER: Do you have a fever? If Yes, ask: What is your temperature, how was it measured, and when did it start?     denies 7. CARDIAC HISTORY: Do you have any history of heart disease? (e.g., heart attack, congestive heart failure)      denies 8. LUNG HISTORY: Do you have any history of lung disease?  (e.g., pulmonary embolus, asthma, emphysema)     COPD/Emphysema 9. PE RISK FACTORS: Do you have a history of blood clots? (or: recent major surgery, recent prolonged travel, bedridden)     N/a 10. OTHER SYMPTOMS: Do you  have any other symptoms? (e.g., runny nose, wheezing, chest pain)       Wheezing at times 11. PREGNANCY: Is there any chance you are pregnant? When was your last menstrual period?       N/a  Protocols used: Cough - Acute Non-Productive-A-AH

## 2024-06-30 ENCOUNTER — Encounter: Payer: Self-pay | Admitting: Primary Care

## 2024-06-30 ENCOUNTER — Ambulatory Visit: Admitting: Primary Care

## 2024-06-30 ENCOUNTER — Ambulatory Visit

## 2024-06-30 VITALS — BP 124/72 | HR 91 | Temp 97.5°F | Ht 64.0 in | Wt 159.6 lb

## 2024-06-30 DIAGNOSIS — Z87891 Personal history of nicotine dependence: Secondary | ICD-10-CM | POA: Diagnosis not present

## 2024-06-30 DIAGNOSIS — J449 Chronic obstructive pulmonary disease, unspecified: Secondary | ICD-10-CM | POA: Diagnosis not present

## 2024-06-30 MED ORDER — TIOTROPIUM BROMIDE-OLODATEROL 2.5-2.5 MCG/ACT IN AERS: 2.0000 | INHALATION_SPRAY | Freq: Every day | RESPIRATORY_TRACT | 5 refills | Status: AC

## 2024-06-30 MED ORDER — STIOLTO RESPIMAT 2.5-2.5 MCG/ACT IN AERS: 2.0000 | INHALATION_SPRAY | Freq: Every day | RESPIRATORY_TRACT | Status: AC

## 2024-06-30 NOTE — Progress Notes (Signed)
 @Patient  ID: Stacy Garcia, female    DOB: 03-16-1951, 73 y.o.   MRN: [id number]  Chief Complaint  Patient presents with   Cough    Dry, x 2 months. Pt states the cough will make her vomit. Denies chest pain and fever.     Referring provider: Merna Huxley, NP  HPI: 73 year old female, former smoker. PMH significant for COPD, CAD, GERD, hypothyroidism, hyperlipidemia, s/p hernia repair, OA, s/p total knee replair, tobacco use.    06/30/2024 Discussed the use of AI scribe software for clinical note transcription with the patient, who gave verbal consent to proceed.  History of Present Illness Stacy Garcia is a 73 year old female with COPD and emphysema who presents with worsening cough and breathing difficulties. Former patient of Dr. Shellia, she was last seen in 2023.   She has a history of COPD and emphysema, with her last pulmonary function test in 2017 showing 50% predicted lung function. She quit smoking eight years ago after a 54-year history of smoking up to one and a half packs per day.  She experiences breathing difficulties, with shortness of breath during activities such as walking up hills or stairs, and she feels fatigued easily. Her energy levels have decreased, but she still maintains some energy for daily activities.  She has a dry cough rated as a five out of five in severity, with episodes severe enough to induce vomiting, particularly on Monday. She experiences occasional wheezing at night but denies chest tightness, phlegm production, or chest congestion.  She has tried various inhalers in the past, including Breztri and Incruse, but did not like the powder form of Incruse. She is not currently on any inhalers. She denies any issues with affording medications.  Her sleep is variable, sometimes affected by wheezing, but she denies any fear of leaving her home due to breathing issues. She has no history of asthma as a child.   Allergies  Allergen Reactions    Prednisone  Swelling    Swelling of face    Immunization History  Administered Date(s) Administered   Fluad Quad(high Dose 65+) 06/14/2019, 08/14/2020   INFLUENZA, HIGH DOSE SEASONAL PF 06/19/2017, 07/01/2018   Influenza Whole 07/20/2007, 07/17/2010   Influenza,inj,Quad PF,6+ Mos 09/01/2013, 10/26/2015   Influenza-Unspecified 06/22/2022   PFIZER(Purple Top)SARS-COV-2 Vaccination 10/01/2019, 10/22/2019, 08/14/2020   Pneumococcal Conjugate-13 06/19/2017   Pneumococcal Polysaccharide-23 08/01/2008, 10/26/2015   Td 09/09/2001   Tdap 02/24/2012, 04/11/2021   Unspecified SARS-COV-2 Vaccination 06/22/2022    Past Medical History:  Diagnosis Date   AAA (abdominal aortic aneurysm)    Arthritis    Blood transfusion without reported diagnosis    COPD (chronic obstructive pulmonary disease) (HCC)    Dysmenorrhea    Dyspnea 01/28/2017   just recently started wheezing   Emphysema of lung (HCC)    GERD (gastroesophageal reflux disease)    Hyperlipidemia    Hypothyroidism    Thyroid  disease    Tobacco abuse    Vertigo     Tobacco History: Social History   Tobacco Use  Smoking Status Former   Current packs/day: 0.00   Average packs/day: 1 pack/day for 50.0 years (50.0 ttl pk-yrs)   Types: Cigarettes   Start date: 01/01/1967   Quit date: 12/31/2016   Years since quitting: 7.5  Smokeless Tobacco Never   Counseling given: Not Answered   Outpatient Medications Prior to Visit  Medication Sig Dispense Refill   albuterol  (VENTOLIN  HFA) 108 (90 Base) MCG/ACT inhaler Inhale 2 puffs  into the lungs every 6 (six) hours as needed for wheezing or shortness of breath. 1 each 5   aspirin 325 MG EC tablet Take 325 mg by mouth daily.     atorvastatin  (LIPITOR) 40 MG tablet TAKE 1 TABLET EVERY DAY 30 tablet 11   diazepam  (VALIUM ) 5 MG tablet TAKE 1/2 TO 1 (ONE-HALF TO ONE) TABLET BY MOUTH TWICE DAILY AS NEEDED 30 tablet 2   HYDROcodone -acetaminophen  (NORCO) 7.5-325 MG tablet Take 1 tablet by  mouth 3 (three) times daily as needed.     ibuprofen  (ADVIL ) 200 MG tablet Take 400 mg by mouth every 6 (six) hours as needed for moderate pain or headache.     levothyroxine  (SYNTHROID ) 75 MCG tablet Take 1 tablet (75 mcg total) by mouth daily. 90 tablet 3   meclizine  (ANTIVERT ) 25 MG tablet Take 1 tablet (25 mg total) by mouth 3 (three) times daily as needed. 30 tablet 0   No facility-administered medications prior to visit.    Review of Systems  Review of Systems  Constitutional: Negative.   HENT: Negative.    Respiratory: Negative.    Cardiovascular: Negative.   Gastrointestinal: Negative.   Skin: Negative.   Allergic/Immunologic: Negative.   Neurological: Negative.   Psychiatric/Behavioral: Negative.      Physical Exam  BP 124/72   Pulse 91   Temp (!) 97.5 F (36.4 C)   Ht 5' 4 (1.626 m)   Wt 159 lb 9.6 oz (72.4 kg)   SpO2 95% Comment: ra  BMI 27.40 kg/m  Physical Exam  Physical Exam CONSTITUTIONAL: Normal appearance, well developed. HEAD EARS NOSE THROAT: Normocephalic and atraumatic, Mucous membranes are moist, Oropharynx is clear. EYES: Pupils are equal, round, and reactive to light. NEUROLOGICAL: No focal deficit present. Alert and oriented to person, place, and time, Mental status is at baseline. CARDIOVASCULAR: Normal rate and regular rhythm, Normal heart sounds, No murmur heard. PULMONARY: Pulmonary effort is normal, No respiratory distress, Normal breath sounds, No wheezing or rhonchi, Lungs clear to auscultation bilaterally. MUSCULOSKELETAL: Normal range of motion, Normal range of motion and neck supple. SKIN: Skin is warm and dry, No erythema or rash. PSYCHIATRIC: Mood normal, Behavior normal, Thought content normal, Judgment normal.   Lab Results:  CBC    Component Value Date/Time   WBC 6.6 05/05/2024 0901   RBC 5.20 (H) 05/05/2024 0901   HGB 16.2 (H) 05/05/2024 0901   HCT 48.9 (H) 05/05/2024 0901   PLT 208.0 05/05/2024 0901   MCV 94.0  05/05/2024 0901   MCH 30.7 04/22/2023 0909   MCHC 33.1 05/05/2024 0901   RDW 14.3 05/05/2024 0901   LYMPHSABS 2.1 05/05/2024 0901   MONOABS 0.4 05/05/2024 0901   EOSABS 0.1 05/05/2024 0901   BASOSABS 0.0 05/05/2024 0901    BMET    Component Value Date/Time   NA 144 05/05/2024 0901   K 5.3 No hemolysis seen (H) 05/05/2024 0901   CL 101 05/05/2024 0901   CO2 32 05/05/2024 0901   GLUCOSE 110 (H) 05/05/2024 0901   GLUCOSE 87 07/15/2006 1043   BUN 13 05/05/2024 0901   CREATININE 0.77 05/05/2024 0901   CALCIUM  9.9 05/05/2024 0901   GFRNONAA >60 04/22/2023 0909   GFRAA >60 02/09/2017 0219    BNP    Component Value Date/Time   BNP 33.8 04/22/2023 0909    ProBNP No results found for: PROBNP  Imaging: No results found.   Assessment & Plan:   1. Former smoker (Primary) - Ambulatory Referral for  Lung Cancer Scre - Pulmonary Function Test; Future  2. 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 She is not on any routine BD regimen. Currently experiencing daily dyspnea, fatigue, and occasional wheezing. Lung function test from 2017 showed FEV1 50% predicted with moderate diffusion capacity.  No wheezing on examination today. Open to trying a new inhaler that is not a dry powder. - Prescribe Stiolto Respimat , two puffs once daily  - Instruct nurse to demonstrate inhaler use - Send prescription to [facility] for mail delivery - Schedule follow-up in 6 weeks to assess inhaler efficacy - Perform PFTs at follow-up   Chronic cough Chronic dry cough. No sputum production or chest tightness reported. Cough is intermittent and sometimes severe, with no lung congestion noted. - Recommend over-the-counter Delsym (dextromethorphan) twice daily until symptoms improve  Lung cancer screening for high-risk  former smoker High-risk former smoker with a 54-year smoking history, quitting 8 years ago. Smoked up to 1.5 packs per day. Initially reluctant to undergo lung cancer screening but agreed after discussion of benefits and early detection advantages. Early detection allows for more treatment options and better outcomes. Screening involves less radiation than a normal CT scan and is covered by insurance. - Refer to lung cancer screening program for annual CT scan - Inform about insurance coverage and reduced radiation exposure of screening CT   I personally spent a total of 35 minutes in the care of the patient today including performing a medically appropriate exam/evaluation, counseling and educating, placing orders, referring and communicating with other health care professionals, documenting clinical information in the EHR, and coordinating care.   Stacy LELON Ferrari, NP 06/30/2024

## 2024-06-30 NOTE — Patient Instructions (Addendum)
  VISIT SUMMARY: Today, we discussed your worsening cough and breathing difficulties. We reviewed your history of COPD and emphysema, and you shared that you have not been using any inhalers recently. We also talked about your chronic cough and the importance of lung cancer screening given your smoking history.  YOUR PLAN: -CHRONIC OBSTRUCTIVE PULMONARY DISEASE WITH EMPHYSEMA: COPD with emphysema is a long-term lung condition that makes it hard to breathe. We will start you on a new inhaler called Stiolto which is non-dry powder inhaler to help manage your symptoms. The nurse will show you how to use it, and we will send the prescription to [facility] for mail delivery. We will follow up in 6 weeks to see how you are doing and perform a shorter breathing test before your visit.  -CHRONIC COUGH: A chronic cough is a cough that lasts a long time. To help manage your severe dry cough, we recommend taking over-the-counter Delsym (dextromethorphan) twice daily for one week. After the first week, you can use it as needed.  -LUNG CANCER SCREENING FOR HIGH-RISK FORMER SMOKER: Given your history of heavy smoking, you are at higher risk for lung cancer. Early detection through annual CT scans can provide more treatment options and better outcomes. We will refer you to a lung cancer screening program, and this screening is covered by insurance and involves less radiation than a normal CT scan.  INSTRUCTIONS: Please follow up in 6 weeks to assess the efficacy of the new inhaler. We will also perform a shorter breathing test before your visit. Additionally, please proceed with the lung cancer screening as discussed.  Referral: Lung cancer screening  Rx: Stiolto respimat  sample and prescription sent to mail in pharmacy   Follow-up 6-8 week follow-up with Beth NP 30 min PFT prior

## 2024-06-30 NOTE — Telephone Encounter (Signed)
 Has appointment today with Stacy Garcia

## 2024-07-14 ENCOUNTER — Other Ambulatory Visit (HOSPITAL_BASED_OUTPATIENT_CLINIC_OR_DEPARTMENT_OTHER): Payer: Self-pay | Admitting: Adult Medicine

## 2024-07-14 DIAGNOSIS — M8589 Other specified disorders of bone density and structure, multiple sites: Secondary | ICD-10-CM

## 2024-07-23 ENCOUNTER — Telehealth: Payer: Self-pay | Admitting: Acute Care

## 2024-07-23 DIAGNOSIS — Z87891 Personal history of nicotine dependence: Secondary | ICD-10-CM

## 2024-07-23 DIAGNOSIS — Z122 Encounter for screening for malignant neoplasm of respiratory organs: Secondary | ICD-10-CM

## 2024-07-23 NOTE — Telephone Encounter (Signed)
 Lung Cancer Screening Narrative/Criteria Questionnaire (Cigarette Smokers Only- No Cigars/Pipes/vapes)   Stacy Garcia   SDMV:08/09/24 @0930a [place]                                           1950/10/04              LDCT: 08/10/24 @0940a /[address] [facility]    73 y.o.   Phone: [phone]  Lung Screening Narrative (confirm age 18-77 yrs Medicare / 50-80 yrs Private pay insurance)   Insurance information:[employer]    Referring Provider:Walsh   This screening involves an initial phone call with a team member from our program. It is called a shared decision making visit. The initial meeting is required by insurance and Medicare to make sure you understand the program. This appointment takes about 15-20 minutes to complete. The CT scan will completed at a separate date/time. This scan takes about 5-10 minutes to complete and you may eat and drink before and after the scan.  Criteria questions for Lung Cancer Screening:   Are you a current or former smoker? Former Age began smoking: 9y   If you are a former smoker, what year did you quit smoking? 2018   To calculate your smoking history, I need an accurate estimate of how many packs of cigarettes you smoked per day and for how many years. (Not just the number of PPD you are now smoking)   Years smoking 57 x Packs per day avg 1ppd (0.5to 1.5) = Pack years 57   (at least 20 pack yrs)   (Make sure they understand that we need to know how much they have smoked in the past, not just the number of PPD they are smoking now)  Do you have a personal history of cancer?  No    Do you have a family history of cancer? Yes  (cancer type and and relative) brother /lung   Are you coughing up blood?  No  Have you had unexplained weight loss of 15 lbs or more in the last 6 months? No  It looks like you meet all criteria.     Additional information: N/A

## 2024-08-09 ENCOUNTER — Ambulatory Visit: Admitting: *Deleted

## 2024-08-09 ENCOUNTER — Encounter: Payer: Self-pay | Admitting: *Deleted

## 2024-08-09 DIAGNOSIS — Z87891 Personal history of nicotine dependence: Secondary | ICD-10-CM | POA: Diagnosis not present

## 2024-08-09 NOTE — Progress Notes (Signed)
 Virtual Visit via Telephone Note  I connected with WAI MINOTTI on 08/09/24 at  9:30 AM EST by telephone and verified that I am speaking with the correct person using two identifiers.  Location: Patient: at home Provider: [address] [place], [place], [address]     I discussed the limitations, risks, security and privacy concerns of performing an evaluation and management service by telephone and the availability of in person appointments. I also discussed with the patient that there may be a patient responsible charge related to this service. The patient expressed understanding and agreed to proceed.   Shared Decision Making Visit Lung Cancer Screening Program [id number])   Eligibility: Age 73 y.o. Pack Years Smoking History Calculation 56 (# packs/per year x # years smoked) Recent History of coughing up blood  no Unexplained weight loss? no ( >Than 15 pounds within the last 6 months ) Prior History Lung / other cancer no (Diagnosis within the last 5 years already requiring surveillance chest CT Scans). Smoking Status Former Smoker Former Smokers: Years since quit: 7 years  Quit Date: 2018  Visit Components: Discussion included one or more decision making aids. yes Discussion included risk/benefits of screening. yes Discussion included potential follow up diagnostic testing for abnormal scans. yes Discussion included meaning and risk of over diagnosis. yes Discussion included meaning and risk of False Positives. yes Discussion included meaning of total radiation exposure. yes  Counseling Included: Importance of adherence to annual lung cancer LDCT screening. yes Impact of comorbidities on ability to participate in the program. yes Ability and willingness to under diagnostic treatment. yes  Smoking Cessation Counseling: Current Smokers:  Discussed importance of smoking cessation. yes Information about tobacco cessation classes and interventions provided to  patient. yes Patient provided with ticket for LDCT Scan. no Symptomatic Patient.no   Diagnosis Code: Tobacco Use Z72.0 Asymptomatic Patient yes Former Smokers:  Discussed the importance of maintaining cigarette abstinence. yes Diagnosis Code: Personal History of Nicotine Dependence. S12.108 Information about tobacco cessation classes and interventions provided to patient. Yes Patient provided with ticket for LDCT Scan. no Written Order for Lung Cancer Screening with LDCT placed in Epic. Yes (CT Chest Lung Cancer Screening Low Dose W/O CM) [id number] Z12.2-Screening of respiratory organs Z87.891-Personal history of nicotine dependence   Laneta Speaks, RN

## 2024-08-09 NOTE — Patient Instructions (Signed)

## 2024-08-10 ENCOUNTER — Other Ambulatory Visit: Payer: Self-pay | Admitting: Adult Health

## 2024-08-10 ENCOUNTER — Inpatient Hospital Stay
Admission: RE | Admit: 2024-08-10 | Discharge: 2024-08-10 | Disposition: A | Source: Ambulatory Visit | Attending: Adult Health

## 2024-08-10 DIAGNOSIS — H8110 Benign paroxysmal vertigo, unspecified ear: Secondary | ICD-10-CM

## 2024-08-10 DIAGNOSIS — Z122 Encounter for screening for malignant neoplasm of respiratory organs: Secondary | ICD-10-CM

## 2024-08-10 DIAGNOSIS — Z87891 Personal history of nicotine dependence: Secondary | ICD-10-CM

## 2024-08-11 NOTE — Telephone Encounter (Signed)
 Okay for refill?

## 2024-08-13 ENCOUNTER — Other Ambulatory Visit: Payer: Self-pay

## 2024-08-13 DIAGNOSIS — Z122 Encounter for screening for malignant neoplasm of respiratory organs: Secondary | ICD-10-CM

## 2024-08-13 DIAGNOSIS — Z87891 Personal history of nicotine dependence: Secondary | ICD-10-CM

## 2024-09-09 NOTE — Progress Notes (Unsigned)
 "  @Patient  ID: Stacy Garcia, female    DOB: 04/09/1951, 74 y.o.   MRN: [id number]  No chief complaint on file.   Referring provider: Hope Almarie ORN, NP  HPI:  74 year old female, former smoker. PMH significant for COPD, CAD, GERD, hypothyroidism, hyperlipidemia, s/p hernia repair, OA, s/p total knee replair, tobacco use.    06/30/2024 Discussed the use of AI scribe software for clinical note transcription with the patient, who gave verbal consent to proceed.  History of Present Illness Stacy Garcia is a 74 year old female with COPD and emphysema who presents with worsening cough and breathing difficulties. Former patient of Dr. Shellia, she was last seen in 2023.   She has a history of COPD and emphysema, with her last pulmonary function test in 2017 showing 50% predicted lung function. She quit smoking eight years ago after a 54-year history of smoking up to one and a half packs per day.  She experiences breathing difficulties, with shortness of breath during activities such as walking up hills or stairs, and she feels fatigued easily. Her energy levels have decreased, but she still maintains some energy for daily activities.  She has a dry cough rated as a five out of five in severity, with episodes severe enough to induce vomiting, particularly on Monday. She experiences occasional wheezing at night but denies chest tightness, phlegm production, or chest congestion.  She has tried various inhalers in the past, including Breztri and Incruse, but did not like the powder form of Incruse. She is not currently on any inhalers. She denies any issues with affording medications.  Her sleep is variable, sometimes affected by wheezing, but she denies any fear of leaving her home due to breathing issues. She has no history of asthma as a child.    09/10/2024- Interim hx Discussed the use of AI scribe software for clinical note transcription with the patient, who gave verbal consent to  proceed.  History of Present Illness  Hx COPD and emphysema  PFT follow-up     Allergies[1]  Immunization History  Administered Date(s) Administered   Fluad Quad(high Dose 65+) 06/14/2019, 08/14/2020   INFLUENZA, HIGH DOSE SEASONAL PF 06/19/2017, 07/01/2018   Influenza Whole 07/20/2007, 07/17/2010   Influenza,inj,Quad PF,6+ Mos 09/01/2013, 10/26/2015   Influenza-Unspecified 06/22/2022   PFIZER(Purple Top)SARS-COV-2 Vaccination 10/01/2019, 10/22/2019, 08/14/2020   Pneumococcal Conjugate-13 06/19/2017   Pneumococcal Polysaccharide-23 08/01/2008, 10/26/2015   Td 09/09/2001   Tdap 02/24/2012, 04/11/2021   Unspecified SARS-COV-2 Vaccination 06/22/2022    Past Medical History:  Diagnosis Date   AAA (abdominal aortic aneurysm)    Arthritis    Blood transfusion without reported diagnosis    COPD (chronic obstructive pulmonary disease) (HCC)    Dysmenorrhea    Dyspnea 01/28/2017   just recently started wheezing   Emphysema of lung (HCC)    GERD (gastroesophageal reflux disease)    Hyperlipidemia    Hypothyroidism    Thyroid  disease    Tobacco abuse    Vertigo     Tobacco History: Tobacco Use History[2] Counseling given: Not Answered   Outpatient Medications Prior to Visit  Medication Sig Dispense Refill   albuterol  (VENTOLIN  HFA) 108 (90 Base) MCG/ACT inhaler Inhale 2 puffs into the lungs every 6 (six) hours as needed for wheezing or shortness of breath. 1 each 5   aspirin 325 MG EC tablet Take 325 mg by mouth daily.     atorvastatin  (LIPITOR) 40 MG tablet TAKE 1 TABLET EVERY DAY 30 tablet  11   diazepam  (VALIUM ) 5 MG tablet TAKE 1/2 TO 1 (ONE-HALF TO ONE) TABLET BY MOUTH TWICE DAILY AS NEEDED 30 tablet 2   HYDROcodone -acetaminophen  (NORCO) 7.5-325 MG tablet Take 1 tablet by mouth 3 (three) times daily as needed.     ibuprofen  (ADVIL ) 200 MG tablet Take 400 mg by mouth every 6 (six) hours as needed for moderate pain or headache.     levothyroxine  (SYNTHROID ) 75 MCG  tablet Take 1 tablet (75 mcg total) by mouth daily. 90 tablet 3   meclizine  (ANTIVERT ) 25 MG tablet Take 1 tablet (25 mg total) by mouth 3 (three) times daily as needed. 30 tablet 0   Tiotropium Bromide -Olodaterol (STIOLTO RESPIMAT ) 2.5-2.5 MCG/ACT AERS Inhale 2 puffs into the lungs daily.     Tiotropium Bromide -Olodaterol 2.5-2.5 MCG/ACT AERS Inhale 2 puffs into the lungs daily. 4 each 5   No facility-administered medications prior to visit.      Review of Systems  Review of Systems   Physical Exam  There were no vitals taken for this visit. Physical Exam  ***  Lab Results:  CBC    Component Value Date/Time   WBC 6.6 05/05/2024 0901   RBC 5.20 (H) 05/05/2024 0901   HGB 16.2 (H) 05/05/2024 0901   HCT 48.9 (H) 05/05/2024 0901   PLT 208.0 05/05/2024 0901   MCV 94.0 05/05/2024 0901   MCH 30.7 04/22/2023 0909   MCHC 33.1 05/05/2024 0901   RDW 14.3 05/05/2024 0901   LYMPHSABS 2.1 05/05/2024 0901   MONOABS 0.4 05/05/2024 0901   EOSABS 0.1 05/05/2024 0901   BASOSABS 0.0 05/05/2024 0901    BMET    Component Value Date/Time   NA 144 05/05/2024 0901   K 5.3 No hemolysis seen (H) 05/05/2024 0901   CL 101 05/05/2024 0901   CO2 32 05/05/2024 0901   GLUCOSE 110 (H) 05/05/2024 0901   GLUCOSE 87 07/15/2006 1043   BUN 13 05/05/2024 0901   CREATININE 0.77 05/05/2024 0901   CALCIUM  9.9 05/05/2024 0901   GFRNONAA >60 04/22/2023 0909   GFRAA >60 02/09/2017 0219    BNP    Component Value Date/Time   BNP 33.8 04/22/2023 0909    ProBNP No results found for: PROBNP  Imaging: No results found.   Assessment & Plan:   No problem-specific Assessment & Plan notes found for this encounter.   1. Chronic obstructive pulmonary disease, unspecified COPD type (HCC) (Primary)   Assessment and Plan Assessment & Plan       I personally spent a total of *** minutes in the care of the patient today including {Time Based Coding:[id number]}.   Almarie LELON Ferrari,  NP 09/09/2024    [1]  Allergies Allergen Reactions   Prednisone  Swelling    Swelling of face  [2]  Social History Tobacco Use  Smoking Status Former   Current packs/day: 0.00   Average packs/day: 1 pack/day for 56.0 years (56.0 ttl pk-yrs)   Types: Cigarettes   Start date: 12/31/1960   Quit date: 12/31/2016   Years since quitting: 7.6  Smokeless Tobacco Never   "

## 2024-09-10 ENCOUNTER — Encounter: Payer: Self-pay | Admitting: Primary Care

## 2024-09-10 ENCOUNTER — Ambulatory Visit: Admitting: *Deleted

## 2024-09-10 ENCOUNTER — Ambulatory Visit: Admitting: Primary Care

## 2024-09-10 VITALS — BP 126/64 | HR 100 | Temp 97.4°F | Ht 64.5 in | Wt 161.0 lb

## 2024-09-10 DIAGNOSIS — J449 Chronic obstructive pulmonary disease, unspecified: Secondary | ICD-10-CM

## 2024-09-10 DIAGNOSIS — Z87891 Personal history of nicotine dependence: Secondary | ICD-10-CM

## 2024-09-10 DIAGNOSIS — R911 Solitary pulmonary nodule: Secondary | ICD-10-CM | POA: Diagnosis not present

## 2024-09-10 DIAGNOSIS — K449 Diaphragmatic hernia without obstruction or gangrene: Secondary | ICD-10-CM

## 2024-09-10 LAB — PULMONARY FUNCTION TEST
DL/VA % pred: 99 %
DL/VA: 4.07 ml/min/mmHg/L
DLCO cor % pred: 64 %
DLCO cor: 12.62 ml/min/mmHg
DLCO unc % pred: 64 %
DLCO unc: 12.62 ml/min/mmHg
FEF 25-75 Post: 1.26 L/s
FEF 25-75 Pre: 0.41 L/s
FEF2575-%Change-Post: 204 %
FEF2575-%Pred-Post: 74 %
FEF2575-%Pred-Pre: 24 %
FEV1-%Change-Post: 27 %
FEV1-%Pred-Post: 55 %
FEV1-%Pred-Pre: 43 %
FEV1-Post: 1.19 L
FEV1-Pre: 0.93 L
FEV1FVC-%Change-Post: 6 %
FEV1FVC-%Pred-Pre: 87 %
FEV6-%Change-Post: 19 %
FEV6-%Pred-Post: 61 %
FEV6-%Pred-Pre: 51 %
FEV6-Post: 1.69 L
FEV6-Pre: 1.41 L
FEV6FVC-%Change-Post: 0 %
FEV6FVC-%Pred-Post: 104 %
FEV6FVC-%Pred-Pre: 104 %
FVC-%Change-Post: 19 %
FVC-%Pred-Post: 59 %
FVC-%Pred-Pre: 49 %
FVC-Post: 1.7 L
FVC-Pre: 1.42 L
Post FEV1/FVC ratio: 70 %
Post FEV6/FVC ratio: 99 %
Pre FEV1/FVC ratio: 66 %
Pre FEV6/FVC Ratio: 99 %
RV % pred: 179 %
RV: 4.15 L
TLC % pred: 115 %
TLC: 5.9 L

## 2024-09-10 NOTE — Patient Instructions (Signed)
 Full PFT performed today.

## 2024-09-10 NOTE — Progress Notes (Signed)
 Full PFT performed today.

## 2024-09-10 NOTE — Patient Instructions (Signed)
" °  VISIT SUMMARY: Today, we discussed your chronic obstructive pulmonary disease (COPD) and reviewed your recent breathing test results. We also talked about your pulmonary nodule, hiatal hernia with reflux symptoms, and general health maintenance. Your lung function has remained stable, and we have outlined a plan to manage your symptoms and monitor your condition.  YOUR PLAN: -CHRONIC OBSTRUCTIVE PULMONARY DISEASE (COPD): COPD is a chronic lung disease that makes it hard to breathe. Your lung function has remained stable since 2017, and you are not currently experiencing significant symptoms. We will continue with your annual lung cancer screening and perform a walk test to check your oxygen levels. If you experience symptoms like chest congestion or shortness of breath due to bronchitis, flu, or COVID, consider resuming Stiolto. If you have chest tightness or pain that does not improve with rest, please visit the emergency room.  -PULMONARY NODULE, LEFT UPPER LOBE: A pulmonary nodule is a small growth in the lung. Your nodule is small and appears benign. We will continue to monitor it with annual CT scans to ensure it does not change.  -HIATAL HERNIA WITH REFLUX: A hiatal hernia occurs when part of the stomach pushes up through the diaphragm, which can cause reflux symptoms like a burning sensation. You can try using Maalox, Milk of Magnesia, or Pepto Bismol for relief. Drinking milk may also help with your symptoms.  INSTRUCTIONS: Please continue with your annual lung cancer screening and follow up with the walk test to check your oxygen levels. If you experience any new or worsening symptoms, contact our office or visit the emergency room if necessary.  "

## 2024-09-30 ENCOUNTER — Ambulatory Visit: Admitting: Family Medicine

## 2024-09-30 ENCOUNTER — Encounter: Payer: Self-pay | Admitting: Family Medicine

## 2024-09-30 DIAGNOSIS — Z0000 Encounter for general adult medical examination without abnormal findings: Secondary | ICD-10-CM | POA: Diagnosis not present

## 2024-09-30 NOTE — Patient Instructions (Signed)
 I really enjoyed getting to talk with you today! I am available on Tuesdays and Thursdays for virtual visits if you have any questions or concerns, or if I can be of any further assistance.   CHECKLIST FROM ANNUAL WELLNESS VISIT:  -Follow up (please call to schedule if not scheduled after visit):   -yearly for annual wellness visit with primary care office  Here is a list of your preventive care/health maintenance measures and the plan for each if any are due:  PLAN For any measures below that may be due:   Health Maintenance  Topic Date Due   Medicare Annual Wellness (AWV)  05/21/2024   Influenza Vaccine  12/07/2024 (Originally 04/09/2024)   Zoster Vaccines- Shingrix (1 of 2) 12/29/2024 (Originally 10/12/1969)   Bone Density Scan  05/05/2025 (Originally 10/11/2013)   Colonoscopy  05/05/2025 (Originally 01/06/2021)   Lung Cancer Screening  08/10/2025   DTaP/Tdap/Td (4 - Td or Tdap) 04/12/2031   Pneumococcal Vaccine: 50+ Years  Completed   Hepatitis C Screening  Completed   Meningococcal B Vaccine  Aged Out   Mammogram  Discontinued   COVID-19 Vaccine  Discontinued    -See a dentist at least yearly  -Get your eyes checked and then per your eye specialist's recommendations  -Other issues addressed today:   -I have included below further information regarding a healthy whole foods based diet, physical activity guidelines for adults, stress management and opportunities for social connections. I hope you find this information useful.   -----------------------------------------------------------------------------------------------------------------------------------------------------------------------------------------------------------------------------------------------------------    NUTRITION: -eat real food: lots of colorful vegetables (half the plate) and fruits -5-7 servings of vegetables and fruits per day (fresh or steamed is best), exp. 2 servings of vegetables with lunch and  dinner and 2 servings of fruit per day. Berries and greens such as kale and collards are great choices.  -consume on a regular basis:  fresh fruits, fresh veggies, fish, nuts, seeds, healthy oils (such as olive oil, avocado oil), whole grains (make sure for bread/pasta/crackers/etc., that the first ingredient on label contains the word whole), legumes. -can eat small amounts of dairy and lean meat (no larger than the palm of your hand), but avoid processed meats such as ham, bacon, lunch meat, etc. -drink water -try to avoid fast food and pre-packaged foods, processed meat, ultra processed foods/beverages (donuts, candy, etc.) -most experts advise limiting sodium to < 2300mg  per day, should limit further is any chronic conditions such as high blood pressure, heart disease, diabetes, etc. The American Heart Association advised that < 1500mg  is is ideal -try to avoid foods/beverages that contain any ingredients with names you do not recognize  -try to avoid foods/beverages  with added sugar or sweeteners/sweets  -try to avoid sweet drinks (including diet drinks): soda, juice, Gatorade, sweet tea, power drinks, diet drinks -try to avoid white rice, white bread, pasta (unless whole grain)  EXERCISE GUIDELINES FOR ADULTS: -if you wish to increase your physical activity, do so gradually and with the approval of your doctor -STOP and seek medical care immediately if you have any chest pain, chest discomfort or trouble breathing when starting or increasing exercise  -move and stretch your body, legs, feet and arms when sitting for long periods -Physical activity guidelines for optimal health in adults: -get at least 150 minutes per week of moderate exercise (can talk, but not sing); this is about 20-30 minutes of sustained activity 5-7 days per week or two 10-15 minute episodes of sustained activity 5-7 days per week -do  some muscle building/resistance training/strength training at least 2 days per week   -balance exercises 3+ days per week:   Stand somewhere where you have something sturdy to hold onto if you lose balance    1) lift up on toes, then back down, start with 5x per day and work up to 20x   2) stand and lift one leg straight out to the side so that foot is a few inches of the floor, start with 5x each side and work up to 20x each side   3) stand on one foot, start with 5 seconds each side and work up to 20 seconds on each side  If you need ideas or help with getting more active:  -Silver sneakers https://tools.silversneakers.com  -Walk with a Doc: Http://www.duncan-williams.com/  -try to include resistance (weight lifting/strength building) and balance exercises twice per week: or the following link for ideas: http://castillo-powell.com/  buyducts.dk  STRESS MANAGEMENT: -can try meditating, or just sitting quietly with deep breathing while intentionally relaxing all parts of your body for 5 minutes daily -if you need further help with stress, anxiety or depression please follow up with your primary doctor or contact the wonderful folks at [facility]: [phone]  SOCIAL CONNECTIONS: -options in [place] if you wish to engage in more social and exercise related activities:  -Silver sneakers https://tools.silversneakers.com  -Walk with a Doc: Http://www.duncan-williams.com/  -Check out the [facility] Active Adults 50+ section on the [place] of [employer] (hiking clubs, book clubs, cards and games, chess, exercise classes, aquatic classes and much more) - see the website for details: https://www.[place].gov/departments/parks-recreation/active-adults50  -YouTube has lots of exercise videos for different ages and abilities as well  -[facility] (a variety of indoor and outdoor inperson activities for adults). [phone]. [address]  -Virtual Online Classes (a variety of topics): see seniorplanet.org or call [phone]  -consider volunteering at a school, hospice center, church, senior center or elsewhere

## 2024-09-30 NOTE — Progress Notes (Signed)
 " To check-in Nursing staff: Please review Meds, Allergies, PMH, and care teams and update Please request wt, home BP, etc and update vitals if able Please complete the following flowsheets under the Medicare Visits Tab:  Medicare Wellness  Stress  PHQ-2-9  Exercise  Social Connections  Method of visit: Not In Person ----------------------------------------------------------------------------------------------------------------------------------------------------------------------------------------------------------------------  Because this visit was a virtual/telehealth visit, some criteria may be missing or patient reported. Any vitals not documented were not able to be obtained and vitals that have been documented are patient reported.    MEDICARE ANNUAL PREVENTIVE VISIT WITH PROVIDER: (Welcome to Medicare, initial annual wellness or annual wellness exam)  Virtual Visit via Phone Note  I connected with Stacy Garcia on 09/30/24 by phone and verified that I am speaking with the correct person using two identifiers.  Location patient: home Location provider:work or home office Persons participating in the virtual visit: patient, provider  Concerns and/or follow up today: detailed intake and risks/health assessment completed in flowsheets and below - please see for details. No new concerns.   How often do you have a drink containing alcohol ?n How many drinks containing alcohol  do you have on a typical day when you are drinking?na How often do you have six or more drinks on one occasion?na Have you ever smoked?y Quit date if applicable? Quit 7+years ago  How many packs a day do/did you smoke? 1/2-1 ppd Do you use smokeless tobacco?n Do you use an illicit drugs?n Do you feel safe at home?y Last dentist visit?has dentures Last eye Exam and location?reports goes every few years - feels is fine   See HM section in Epic for other details of completed HM.    ROS: negative for  report of fevers, unintentional weight loss, vision changes, vision loss, hearing loss or change, chest pain, sob, hemoptysis, melena, hematochezia, hematuria or bleeding or bruising  Patient-completed extensive health risk assessment - reviewed and discussed with the patient: See Health Risk Assessment completed with patient prior to the visit either above or in recent phone note. This was reviewed in detailed with the patient today and appropriate recommendations, orders and referrals were placed as needed per Summary below and patient instructions.   Review of Medical History: -PMH, PSH, Family History and current specialty and care providers reviewed and updated and listed below   Patient Care Team: Kahoano, Haku K, MD as PCP - General (General Practice) Anderson Maude ORN, MD (Inactive) as Consulting Physician (Orthopedic Surgery) Shellia Oh, MD as Consulting Physician (Pulmonary Disease)   Past Medical History:  Diagnosis Date   AAA (abdominal aortic aneurysm)    Arthritis    Blood transfusion without reported diagnosis    COPD (chronic obstructive pulmonary disease) (HCC)    Dysmenorrhea    Dyspnea 01/28/2017   just recently started wheezing   Emphysema of lung (HCC)    GERD (gastroesophageal reflux disease)    Hyperlipidemia    Hypothyroidism    Thyroid  disease    Tobacco abuse    Vertigo     Past Surgical History:  Procedure Laterality Date   ABDOMINAL AORTIC ANEURYSM REPAIR N/A 02/05/2017   Procedure: ANEURYSM ABDOMINAL AORTIC REPAIR;  Surgeon: Harvey Carlin BRAVO, MD;  Location: [facility];  Service: Vascular;  Laterality: N/A;   CESAREAN SECTION     x 3   INSERTION OF MESH N/A 09/13/2020   Procedure: INSERTION OF MESH;  Surgeon: Rubin Calamity, MD;  Location: [facility] OR;  Service: General;  Laterality: N/A;   JOINT  REPLACEMENT     ROBOTIC INCISIONAL HERNIA REPAIR WITH MESH RETRORECTUS REPAIR (N/  09/13/2020   SHOULDER SURGERY  02/2015   TOTAL KNEE ARTHROPLASTY Right  01/23/2016   Procedure: TOTAL KNEE ARTHROPLASTY;  Surgeon: Maude LELON Right, MD;  Location: [facility] OR;  Service: Orthopedics;  Laterality: Right;   TUBAL LIGATION     XI ROBOTIC ASSISTED VENTRAL HERNIA N/A 09/13/2020   Procedure: ROBOTIC INCISIONAL HERNIA REPAIR WITH MESH;  Surgeon: Rubin Calamity, MD;  Location: [facility] OR;  Service: General;  Laterality: N/A;    Social History   Socioeconomic History   Marital status: Married    Spouse name: Not on file   Number of children: 3   Years of education: Not on file   Highest education level: Not on file  Occupational History   Occupation: Designer/manufacturing    Comment: Runner, Broadcasting/film/video; retired 2017   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  Tobacco Use   Smoking status: Former    Current packs/day: 0.00    Average packs/day: 1 pack/day for 56.0 years (56.0 ttl pk-yrs)    Types: Cigarettes    Start date: 12/31/1960    Quit date: 12/31/2016    Years since quitting: 7.7   Smokeless tobacco: Never  Vaping Use   Vaping status: Never Used  Substance and Sexual Activity   Alcohol  use: No    Alcohol /week: 0.0 standard drinks of alcohol    Drug use: No   Sexual activity: Yes    Birth control/protection: Surgical  Other Topics Concern   Not on file  Social History Narrative   11/03/2018:   Lives with husband in 2 story home   Has 3 children, 7 grandchildren, 9 great-grandchildren, all of whom live close by   Takes care of two of her great grandchildren (58 yo and 5yo)   Engineer, Manufacturing working, 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   Social Drivers of Health   Tobacco Use: Medium Risk (09/30/2024)   Patient History    Smoking Tobacco Use: Former    Smokeless Tobacco Use: Never    Passive Exposure: Not on file  Financial Resource Strain: Low Risk (09/30/2024)   Overall Financial Resource Strain (CARDIA)    Difficulty of Paying Living Expenses: Not hard at all  Food Insecurity: No Food  Insecurity (09/30/2024)   Epic    Worried About Programme Researcher, Broadcasting/film/video in the Last Year: Never true    Ran Out of Food in the Last Year: Never true  Transportation Needs: No Transportation Needs (04/02/2022)   PRAPARE - Administrator, Civil Service (Medical): No    Lack of Transportation (Non-Medical): No  Physical Activity: Sufficiently Active (09/30/2024)   Exercise Vital Sign    Days of Exercise per Week: 5 days    Minutes of Exercise per Session: 40 min  Stress: No Stress Concern Present (09/30/2024)   [employer] of Occupational Health - Occupational Stress Questionnaire    Feeling of Stress: Not at all  Social Connections: Socially Integrated (09/30/2024)   Social Connection and Isolation Panel    Frequency of Communication with Friends and Family: More than three times a week    Frequency of Social Gatherings with Friends and Family: More than three times a week    Attends Religious Services: More than 4 times per year    Active Member of [employer] or Organizations: Yes    Attends Banker Meetings: More than 4 times  per year    Marital Status: Married  Catering Manager Violence: Not At Risk (09/30/2024)   Epic    Fear of Current or Ex-Partner: No    Emotionally Abused: No    Physically Abused: No    Sexually Abused: No  Depression (PHQ2-9): Low Risk (09/30/2024)   Depression (PHQ2-9)    PHQ-2 Score: 0  Alcohol  Screen: Low Risk (09/30/2024)   Alcohol  Screen    Last Alcohol  Screening Score (AUDIT): 0  Housing: Low Risk (09/30/2024)   Epic    Unable to Pay for Housing in the Last Year: No    Number of Times Moved in the Last Year: 0    Homeless in the Last Year: No  Utilities: Not At Risk (09/30/2024)   Epic    Threatened with loss of utilities: No  Health Literacy: Adequate Health Literacy (09/30/2024)   B1300 Health Literacy    Frequency of need for help with medical instructions: Never    Family History  Problem Relation Age of Onset   Thyroid   disease Mother    Thyroid  disease Brother    Thyroid  disease Other    Diabetes Other    Hyperlipidemia Other    Hypertension Other    Colon cancer Neg Hx    Colon polyps Neg Hx    Esophageal cancer Neg Hx    Rectal cancer Neg Hx    Stomach cancer Neg Hx     Medications Ordered Prior to Encounter[1]  Allergies[2]     Physical Exam Vitals requested from patient and listed below if patient had equipment and was able to obtain at home for this virtual visit: There were no vitals filed for this visit. Estimated body mass index is 27.21 kg/m as calculated from the following:   Height as of 09/10/24: 5' 4.5 (1.638 m).   Weight as of 09/10/24: 161 lb (73 kg).  EKG (optional): deferred due to virtual visit  GENERAL: alert, oriented, no acute distress detected, full vision exam deferred due to pandemic and/or virtual encounter  PSYCH/NEURO: pleasant and cooperative, no obvious depression or anxiety, speech and thought processing grossly intact, Cognitive function grossly intact  Flowsheet Row Office Visit from 04/10/2023 in [facility]  PHQ-9 Total Score 11        09/30/2024    9:41 AM 05/05/2024    7:55 AM 04/10/2023   10:43 AM 10/24/2022   11:29 AM 04/02/2022    9:32 AM  Depression screen PHQ 2/9  Decreased Interest 0 0 1 0 0  Down, Depressed, Hopeless 0 0 1 0 0  PHQ - 2 Score 0 0 2 0 0  Altered sleeping   1    Tired, decreased energy   1    Change in appetite   2    Feeling bad or failure about yourself    0    Trouble concentrating   3    Moving slowly or fidgety/restless   2    Suicidal thoughts   0    PHQ-9 Score   11     Difficult doing work/chores   Somewhat difficult       Data saved with a previous flowsheet row definition       04/10/2023   10:42 AM 10/30/2023    1:00 PM 05/05/2024    7:54 AM 09/30/2024    9:40 AM 09/30/2024   10:07 AM  Fall Risk  Falls in the past year? 0 0 0 0  Was there an injury with Fall? 0  0  0  0   Fall  Risk Category Calculator 0 0 0 0   Patient at Risk for Falls Due to   No Fall Risks No Fall Risks No Fall Risks  Fall risk Follow up Falls evaluation completed Falls evaluation completed Falls evaluation completed Falls evaluation completed Falls evaluation completed     Data saved with a previous flowsheet row definition     SUMMARY AND PLAN:  Encounter for Medicare annual wellness exam   Discussed applicable health maintenance/preventive health measures and advised and referred or ordered per patient preferences: -declines shingles and covid vaccines -she declined colon cancer screening -she had had her bone density test yesterday - [facility] - does not have results yet -says she had her flu shot and lung cancer screening Health Maintenance  Topic Date Due   Influenza Vaccine  12/07/2024 (Originally 04/09/2024)   Zoster Vaccines- Shingrix (1 of 2) 12/29/2024 (Originally 10/12/1969)   Bone Density Scan  05/05/2025 (Originally 10/11/2013)   Colonoscopy  05/05/2025 (Originally 01/06/2021)   Lung Cancer Screening  08/10/2025   Medicare Annual Wellness (AWV)  09/30/2025   DTaP/Tdap/Td (4 - Td or Tdap) 04/12/2031   Pneumococcal Vaccine: 50+ Years  Completed   Hepatitis C Screening  Completed   Meningococcal B Vaccine  Aged Out   Mammogram  Discontinued   COVID-19 Vaccine  Discontinued    Education and counseling on the following was provided based on the above review of health and a plan/checklist for the patient, along with additional information discussed, was provided for the patient in the patient instructions :  -Advised on importance of completing advanced directives, she says she and husband are seeing lawyer to do this -Advised and counseled on a healthy lifestyle - including the importance of a healthy diet, regular physical activity, social connections and stress management. -Reviewed patient's current diet. Advised and counseled on a whole foods based healthy diet. A  summary of a healthy diet was provided in the Patient Instructions.  -reviewed patient's current physical activity level and discussed exercise guidelines for adults. Discussed community resources and ideas for safe exercise at home to assist in meeting exercise guideline recommendations in a safe and healthy way.  -Advise yearly dental visits at minimum and regular eye exams -Advised and counseled on  opoid use/appropriate use, she sees pain management specialist  Follow up: see patient instructions     Patient Instructions  I really enjoyed getting to talk with you today! I am available on Tuesdays and Thursdays for virtual visits if you have any questions or concerns, or if I can be of any further assistance.   CHECKLIST FROM ANNUAL WELLNESS VISIT:  -Follow up (please call to schedule if not scheduled after visit):   -yearly for annual wellness visit with primary care office  Here is a list of your preventive care/health maintenance measures and the plan for each if any are due:  PLAN For any measures below that may be due:   Health Maintenance  Topic Date Due   Medicare Annual Wellness (AWV)  05/21/2024   Influenza Vaccine  12/07/2024 (Originally 04/09/2024)   Zoster Vaccines- Shingrix (1 of 2) 12/29/2024 (Originally 10/12/1969)   Bone Density Scan  05/05/2025 (Originally 10/11/2013)   Colonoscopy  05/05/2025 (Originally 01/06/2021)   Lung Cancer Screening  08/10/2025   DTaP/Tdap/Td (4 - Td or Tdap) 04/12/2031   Pneumococcal Vaccine: 50+ Years  Completed   Hepatitis C  Screening  Completed   Meningococcal B Vaccine  Aged Out   Mammogram  Discontinued   COVID-19 Vaccine  Discontinued    -See a dentist at least yearly  -Get your eyes checked and then per your eye specialist's recommendations  -Other issues addressed today:   -I have included below further information regarding a healthy whole foods based diet, physical activity guidelines for adults, stress management and  opportunities for social connections. I hope you find this information useful.   -----------------------------------------------------------------------------------------------------------------------------------------------------------------------------------------------------------------------------------------------------------    NUTRITION: -eat real food: lots of colorful vegetables (half the plate) and fruits -5-7 servings of vegetables and fruits per day (fresh or steamed is best), exp. 2 servings of vegetables with lunch and dinner and 2 servings of fruit per day. Berries and greens such as kale and collards are great choices.  -consume on a regular basis:  fresh fruits, fresh veggies, fish, nuts, seeds, healthy oils (such as olive oil, avocado oil), whole grains (make sure for bread/pasta/crackers/etc., that the first ingredient on label contains the word whole), legumes. -can eat small amounts of dairy and lean meat (no larger than the palm of your hand), but avoid processed meats such as ham, bacon, lunch meat, etc. -drink water -try to avoid fast food and pre-packaged foods, processed meat, ultra processed foods/beverages (donuts, candy, etc.) -most experts advise limiting sodium to < 2300mg  per day, should limit further is any chronic conditions such as high blood pressure, heart disease, diabetes, etc. The American Heart Association advised that < 1500mg  is is ideal -try to avoid foods/beverages that contain any ingredients with names you do not recognize  -try to avoid foods/beverages  with added sugar or sweeteners/sweets  -try to avoid sweet drinks (including diet drinks): soda, juice, Gatorade, sweet tea, power drinks, diet drinks -try to avoid white rice, white bread, pasta (unless whole grain)  EXERCISE GUIDELINES FOR ADULTS: -if you wish to increase your physical activity, do so gradually and with the approval of your doctor -STOP and seek medical care immediately if you  have any chest pain, chest discomfort or trouble breathing when starting or increasing exercise  -move and stretch your body, legs, feet and arms when sitting for long periods -Physical activity guidelines for optimal health in adults: -get at least 150 minutes per week of moderate exercise (can talk, but not sing); this is about 20-30 minutes of sustained activity 5-7 days per week or two 10-15 minute episodes of sustained activity 5-7 days per week -do some muscle building/resistance training/strength training at least 2 days per week  -balance exercises 3+ days per week:   Stand somewhere where you have something sturdy to hold onto if you lose balance    1) lift up on toes, then back down, start with 5x per day and work up to 20x   2) stand and lift one leg straight out to the side so that foot is a few inches of the floor, start with 5x each side and work up to 20x each side   3) stand on one foot, start with 5 seconds each side and work up to 20 seconds on each side  If you need ideas or help with getting more active:  -Silver sneakers https://tools.silversneakers.com  -Walk with a Doc: Http://www.duncan-williams.com/  -try to include resistance (weight lifting/strength building) and balance exercises twice per week: or the following link for ideas: http://castillo-powell.com/  buyducts.dk  STRESS MANAGEMENT: -can try meditating, or just sitting quietly with deep breathing while intentionally relaxing all  parts of your body for 5 minutes daily -if you need further help with stress, anxiety or depression please follow up with your primary doctor or contact the wonderful folks at [facility]: [phone]  SOCIAL CONNECTIONS: -options in [place] if you wish to engage in more social and exercise related activities:  -Silver sneakers https://tools.silversneakers.com  -Walk with a  Doc: Http://www.duncan-williams.com/  -Check out the [facility] Active Adults 50+ section on the [place] of [employer] companies (hiking clubs, book clubs, cards and games, chess, exercise classes, aquatic classes and much more) - see the website for details: https://www.[place].gov/departments/parks-recreation/active-adults50  -YouTube has lots of exercise videos for different ages and abilities as well  -[facility] (a variety of indoor and outdoor inperson activities for adults). [phone]. [address]  -Virtual Online Classes (a variety of topics): see seniorplanet.org or call [phone]  -consider volunteering at a school, hospice center, church, senior center or elsewhere            Stacy JONELLE Cramp, DO      [1]  Current Outpatient Medications on File Prior to Visit  Medication Sig Dispense Refill   albuterol  (VENTOLIN  HFA) 108 (90 Base) MCG/ACT inhaler Inhale 2 puffs into the lungs every 6 (six) hours as needed for wheezing or shortness of breath. 1 each 5   aspirin 325 MG EC tablet Take 325 mg by mouth daily.     atorvastatin  (LIPITOR) 40 MG tablet TAKE 1 TABLET EVERY DAY 30 tablet 11   diazepam  (VALIUM ) 5 MG tablet TAKE 1/2 TO 1 (ONE-HALF TO ONE) TABLET BY MOUTH TWICE DAILY AS NEEDED 30 tablet 2   HYDROcodone -acetaminophen  (NORCO) 7.5-325 MG tablet Take 1 tablet by mouth 3 (three) times daily as needed.     ibuprofen  (ADVIL ) 200 MG tablet Take 400 mg by mouth every 6 (six) hours as needed for moderate pain or headache.     levothyroxine  (SYNTHROID ) 75 MCG tablet Take 1 tablet (75 mcg total) by mouth daily. 90 tablet 3   meclizine  (ANTIVERT ) 25 MG tablet Take 1 tablet (25 mg total) by mouth 3 (three) times daily as needed. 30 tablet 0   Tiotropium Bromide -Olodaterol (STIOLTO RESPIMAT ) 2.5-2.5 MCG/ACT AERS Inhale 2 puffs into the lungs daily.     Tiotropium Bromide -Olodaterol 2.5-2.5 MCG/ACT AERS Inhale 2 puffs into the lungs daily. 4 each 5   No  current facility-administered medications on file prior to visit.  [2]  Allergies Allergen Reactions   Prednisone  Swelling    Swelling of face   "
